# Patient Record
Sex: Male | Born: 1950 | Race: White | Hispanic: No | Marital: Married | Smoking: Former smoker
Health system: Southern US, Community
[De-identification: ages and names within clinical notes are randomized; demographics above are authoritative.]

## PROBLEM LIST (undated history)

## (undated) ENCOUNTER — Emergency Department (HOSPITAL_COMMUNITY): Discharge status: Absent without leave | Payer: Self-pay

## (undated) DIAGNOSIS — G8929 Other chronic pain: Secondary | ICD-10-CM

## (undated) DIAGNOSIS — R109 Unspecified abdominal pain: Secondary | ICD-10-CM

## (undated) DIAGNOSIS — R3911 Hesitancy of micturition: Secondary | ICD-10-CM

## (undated) DIAGNOSIS — R001 Bradycardia, unspecified: Secondary | ICD-10-CM

---

## 2014-11-09 ENCOUNTER — Encounter (HOSPITAL_COMMUNITY): Payer: Self-pay | Admitting: Neurology

## 2014-11-09 ENCOUNTER — Inpatient Hospital Stay (HOSPITAL_COMMUNITY): Payer: Federal, State, Local not specified - PPO

## 2014-11-09 ENCOUNTER — Emergency Department (HOSPITAL_COMMUNITY): Payer: Federal, State, Local not specified - PPO

## 2014-11-09 ENCOUNTER — Inpatient Hospital Stay (HOSPITAL_COMMUNITY)
Admission: EM | Admit: 2014-11-09 | Discharge: 2014-11-20 | DRG: 028 | Disposition: A | Discharge status: Rehabilitation Facility | Payer: Federal, State, Local not specified - PPO | Attending: General Surgery | Admitting: General Surgery

## 2014-11-09 DIAGNOSIS — Z7982 Long term (current) use of aspirin: Secondary | ICD-10-CM | POA: Diagnosis not present

## 2014-11-09 DIAGNOSIS — N178 Other acute kidney failure: Secondary | ICD-10-CM | POA: Diagnosis not present

## 2014-11-09 DIAGNOSIS — R509 Fever, unspecified: Secondary | ICD-10-CM | POA: Diagnosis not present

## 2014-11-09 DIAGNOSIS — Z791 Long term (current) use of non-steroidal anti-inflammatories (NSAID): Secondary | ICD-10-CM

## 2014-11-09 DIAGNOSIS — S14106S Unspecified injury at C6 level of cervical spinal cord, sequela: Secondary | ICD-10-CM | POA: Diagnosis not present

## 2014-11-09 DIAGNOSIS — G8252 Quadriplegia, C1-C4 incomplete: Secondary | ICD-10-CM | POA: Diagnosis not present

## 2014-11-09 DIAGNOSIS — Z419 Encounter for procedure for purposes other than remedying health state, unspecified: Secondary | ICD-10-CM

## 2014-11-09 DIAGNOSIS — W132XXA Fall from, out of or through roof, initial encounter: Secondary | ICD-10-CM | POA: Diagnosis present

## 2014-11-09 DIAGNOSIS — S14106D Unspecified injury at C6 level of cervical spinal cord, subsequent encounter: Secondary | ICD-10-CM | POA: Diagnosis not present

## 2014-11-09 DIAGNOSIS — N179 Acute kidney failure, unspecified: Secondary | ICD-10-CM | POA: Diagnosis not present

## 2014-11-09 DIAGNOSIS — S51021A Laceration with foreign body of right elbow, initial encounter: Secondary | ICD-10-CM | POA: Diagnosis present

## 2014-11-09 DIAGNOSIS — S12500A Unspecified displaced fracture of sixth cervical vertebra, initial encounter for closed fracture: Secondary | ICD-10-CM | POA: Diagnosis present

## 2014-11-09 DIAGNOSIS — Z79899 Other long term (current) drug therapy: Secondary | ICD-10-CM | POA: Diagnosis not present

## 2014-11-09 DIAGNOSIS — Y93H9 Activity, other involving exterior property and land maintenance, building and construction: Secondary | ICD-10-CM | POA: Diagnosis not present

## 2014-11-09 DIAGNOSIS — I951 Orthostatic hypotension: Secondary | ICD-10-CM | POA: Diagnosis not present

## 2014-11-09 DIAGNOSIS — S14125A Central cord syndrome at C5 level of cervical spinal cord, initial encounter: Principal | ICD-10-CM | POA: Diagnosis present

## 2014-11-09 DIAGNOSIS — S12400A Unspecified displaced fracture of fifth cervical vertebra, initial encounter for closed fracture: Secondary | ICD-10-CM | POA: Diagnosis present

## 2014-11-09 DIAGNOSIS — G960 Cerebrospinal fluid leak: Secondary | ICD-10-CM | POA: Diagnosis present

## 2014-11-09 DIAGNOSIS — R001 Bradycardia, unspecified: Secondary | ICD-10-CM | POA: Diagnosis present

## 2014-11-09 DIAGNOSIS — R578 Other shock: Secondary | ICD-10-CM | POA: Diagnosis present

## 2014-11-09 DIAGNOSIS — Z880 Allergy status to penicillin: Secondary | ICD-10-CM | POA: Diagnosis not present

## 2014-11-09 DIAGNOSIS — S01111A Laceration without foreign body of right eyelid and periocular area, initial encounter: Secondary | ICD-10-CM | POA: Diagnosis present

## 2014-11-09 DIAGNOSIS — S2241XA Multiple fractures of ribs, right side, initial encounter for closed fracture: Secondary | ICD-10-CM | POA: Diagnosis present

## 2014-11-09 DIAGNOSIS — K592 Neurogenic bowel, not elsewhere classified: Secondary | ICD-10-CM | POA: Diagnosis present

## 2014-11-09 DIAGNOSIS — S2249XA Multiple fractures of ribs, unspecified side, initial encounter for closed fracture: Secondary | ICD-10-CM

## 2014-11-09 DIAGNOSIS — N170 Acute kidney failure with tubular necrosis: Secondary | ICD-10-CM | POA: Diagnosis not present

## 2014-11-09 DIAGNOSIS — S12400S Unspecified displaced fracture of fifth cervical vertebra, sequela: Secondary | ICD-10-CM | POA: Diagnosis not present

## 2014-11-09 DIAGNOSIS — R40241 Glasgow coma scale score 13-15: Secondary | ICD-10-CM | POA: Diagnosis present

## 2014-11-09 DIAGNOSIS — N319 Neuromuscular dysfunction of bladder, unspecified: Secondary | ICD-10-CM | POA: Diagnosis not present

## 2014-11-09 DIAGNOSIS — G8250 Quadriplegia, unspecified: Secondary | ICD-10-CM | POA: Diagnosis present

## 2014-11-09 DIAGNOSIS — S12400D Unspecified displaced fracture of fifth cervical vertebra, subsequent encounter for fracture with routine healing: Secondary | ICD-10-CM | POA: Diagnosis not present

## 2014-11-09 DIAGNOSIS — G8254 Quadriplegia, C5-C7 incomplete: Secondary | ICD-10-CM | POA: Diagnosis not present

## 2014-11-09 DIAGNOSIS — M542 Cervicalgia: Secondary | ICD-10-CM | POA: Diagnosis present

## 2014-11-09 DIAGNOSIS — R55 Syncope and collapse: Secondary | ICD-10-CM | POA: Diagnosis not present

## 2014-11-09 HISTORY — DX: Unspecified abdominal pain: R10.9

## 2014-11-09 HISTORY — DX: Other chronic pain: G89.29

## 2014-11-09 HISTORY — DX: Bradycardia, unspecified: R00.1

## 2014-11-09 HISTORY — DX: Hesitancy of micturition: R39.11

## 2014-11-09 LAB — TYPE AND SCREEN
ABO/RH(D): O POS
Antibody Screen: NEGATIVE
Unit division: 0
Unit division: 0

## 2014-11-09 LAB — PREPARE FRESH FROZEN PLASMA
Unit division: 0
Unit division: 0

## 2014-11-09 LAB — PROTIME-INR
INR: 1.06 (ref 0.00–1.49)
Prothrombin Time: 14 seconds (ref 11.6–15.2)

## 2014-11-09 LAB — COMPREHENSIVE METABOLIC PANEL
ALT: 29 U/L (ref 17–63)
AST: 49 U/L — ABNORMAL HIGH (ref 15–41)
Albumin: 3.8 g/dL (ref 3.5–5.0)
Alkaline Phosphatase: 66 U/L (ref 38–126)
Anion gap: 10 (ref 5–15)
BUN: 25 mg/dL — ABNORMAL HIGH (ref 6–20)
CO2: 23 mmol/L (ref 22–32)
Calcium: 8.8 mg/dL — ABNORMAL LOW (ref 8.9–10.3)
Chloride: 106 mmol/L (ref 101–111)
Creatinine, Ser: 1.65 mg/dL — ABNORMAL HIGH (ref 0.61–1.24)
GFR calc Af Amer: 49 mL/min — ABNORMAL LOW (ref >60)
GFR calc non Af Amer: 42 mL/min — ABNORMAL LOW (ref >60)
Glucose, Bld: 110 mg/dL — ABNORMAL HIGH (ref 65–99)
Potassium: 4.2 mmol/L (ref 3.5–5.1)
Sodium: 139 mmol/L (ref 135–145)
Total Bilirubin: 0.9 mg/dL (ref 0.3–1.2)
Total Protein: 5.9 g/dL — ABNORMAL LOW (ref 6.5–8.1)

## 2014-11-09 LAB — CBC
HCT: 39.2 % (ref 39.0–52.0)
Hemoglobin: 13.5 g/dL (ref 13.0–17.0)
MCH: 30 pg (ref 26.0–34.0)
MCHC: 34.4 g/dL (ref 30.0–36.0)
MCV: 87.1 fL (ref 78.0–100.0)
Platelets: 175 10*3/uL (ref 150–400)
RBC: 4.5 MIL/uL (ref 4.22–5.81)
RDW: 13 % (ref 11.5–15.5)
WBC: 7.3 10*3/uL (ref 4.0–10.5)

## 2014-11-09 LAB — MRSA PCR SCREENING: MRSA by PCR: NEGATIVE

## 2014-11-09 LAB — URINALYSIS, ROUTINE W REFLEX MICROSCOPIC
Bilirubin Urine: NEGATIVE
Glucose, UA: NEGATIVE mg/dL
Ketones, ur: NEGATIVE mg/dL
Leukocytes, UA: NEGATIVE
Nitrite: NEGATIVE
Protein, ur: NEGATIVE mg/dL
Specific Gravity, Urine: 1.024 (ref 1.005–1.030)
Urobilinogen, UA: 0.2 mg/dL (ref 0.0–1.0)
pH: 5.5 (ref 5.0–8.0)

## 2014-11-09 LAB — ABO/RH: ABO/RH(D): O POS

## 2014-11-09 LAB — URINE MICROSCOPIC-ADD ON

## 2014-11-09 LAB — ETHANOL: Alcohol, Ethyl (B): <5 mg/dL (ref <5)

## 2014-11-09 LAB — CDS SEROLOGY

## 2014-11-09 MED ORDER — FENTANYL CITRATE (PF) 100 MCG/2ML IJ SOLN
100.0000 ug | Freq: Once | INTRAMUSCULAR | Status: AC
  Administered 2014-11-09: 100 ug via INTRAVENOUS

## 2014-11-09 MED ORDER — HYDROMORPHONE HCL 1 MG/ML IJ SOLN
1.0000 mg | INTRAMUSCULAR | Status: DC | PRN
  Administered 2014-11-09 – 2014-11-19 (×44): 1 mg via INTRAVENOUS
  Filled 2014-11-09 (×47): qty 1

## 2014-11-09 MED ORDER — SODIUM CHLORIDE 0.9 % IV SOLN: INTRAVENOUS | Status: DC | PRN

## 2014-11-09 MED ORDER — CETYLPYRIDINIUM CHLORIDE 0.05 % MT LIQD
7.0000 mL | Freq: Two times a day (BID) | OROMUCOSAL | Status: DC
  Administered 2014-11-09: 7 mL via OROMUCOSAL

## 2014-11-09 MED ORDER — KCL IN DEXTROSE-NACL 20-5-0.45 MEQ/L-%-% IV SOLN
INTRAVENOUS | Status: DC
Start: 2014-11-09 — End: 2014-11-14
  Administered 2014-11-09 – 2014-11-10 (×3): via INTRAVENOUS
  Administered 2014-11-10: 1000 mL via INTRAVENOUS
  Administered 2014-11-11: 12:00:00 via INTRAVENOUS
  Administered 2014-11-11 – 2014-11-12 (×2): 1000 mL via INTRAVENOUS
  Administered 2014-11-12: 16:00:00 via INTRAVENOUS
  Administered 2014-11-12: 1000 mL via INTRAVENOUS
  Administered 2014-11-13 – 2014-11-14 (×3): via INTRAVENOUS
  Filled 2014-11-09 (×17): qty 1000

## 2014-11-09 MED ORDER — PANTOPRAZOLE SODIUM 40 MG PO TBEC
40.0000 mg | DELAYED_RELEASE_TABLET | Freq: Every day | ORAL | Status: DC
  Administered 2014-11-18 – 2014-11-20 (×3): 40 mg via ORAL
  Filled 2014-11-09 (×4): qty 1

## 2014-11-09 MED ORDER — DOPAMINE-DEXTROSE 3.2-5 MG/ML-% IV SOLN
0.0000 ug/kg/min | INTRAVENOUS | Status: DC
  Administered 2014-11-10: 13 ug/kg/min via INTRAVENOUS
  Administered 2014-11-11: 6 ug/kg/min via INTRAVENOUS
  Administered 2014-11-12: 3 ug/kg/min via INTRAVENOUS
  Filled 2014-11-09 (×3): qty 250

## 2014-11-09 MED ORDER — ONDANSETRON HCL 4 MG PO TABS: 4.0000 mg | ORAL_TABLET | Freq: Four times a day (QID) | ORAL | Status: DC | PRN

## 2014-11-09 MED ORDER — DOPAMINE-DEXTROSE 3.2-5 MG/ML-% IV SOLN
INTRAVENOUS | Status: AC | PRN
  Administered 2014-11-09: 10 ug/kg/min via INTRAVENOUS

## 2014-11-09 MED ORDER — ATROPINE SULFATE 1 MG/ML IJ SOLN
INTRAMUSCULAR | Status: AC | PRN
  Administered 2014-11-09: .5 mg via INTRAVENOUS

## 2014-11-09 MED ORDER — ONDANSETRON HCL 4 MG/2ML IJ SOLN
4.0000 mg | Freq: Four times a day (QID) | INTRAMUSCULAR | Status: DC | PRN
  Administered 2014-11-10 (×2): 4 mg via INTRAVENOUS
  Filled 2014-11-09 (×2): qty 2

## 2014-11-09 MED ORDER — IOHEXOL 300 MG/ML  SOLN
100.0000 mL | Freq: Once | INTRAMUSCULAR | Status: AC | PRN
  Administered 2014-11-09: 100 mL via INTRAVENOUS

## 2014-11-09 MED ORDER — FENTANYL CITRATE (PF) 100 MCG/2ML IJ SOLN
INTRAMUSCULAR | Status: AC
  Filled 2014-11-09: qty 2

## 2014-11-09 MED ORDER — BACITRACIN-POLYMYXIN B 500-10000 UNIT/GM OP OINT
TOPICAL_OINTMENT | Freq: Two times a day (BID) | OPHTHALMIC | Status: DC
  Administered 2014-11-09 – 2014-11-16 (×13): via OPHTHALMIC
  Administered 2014-11-17: 1 via OPHTHALMIC
  Administered 2014-11-17 – 2014-11-19 (×4): via OPHTHALMIC
  Filled 2014-11-09 (×2): qty 3.5

## 2014-11-09 MED ORDER — PANTOPRAZOLE SODIUM 40 MG IV SOLR
40.0000 mg | Freq: Every day | INTRAVENOUS | Status: DC
  Administered 2014-11-09 – 2014-11-17 (×7): 40 mg via INTRAVENOUS
  Filled 2014-11-09 (×10): qty 40

## 2014-11-09 MED ORDER — HYDROMORPHONE HCL 1 MG/ML IJ SOLN
0.5000 mg | INTRAMUSCULAR | Status: DC | PRN
  Administered 2014-11-11 – 2014-11-16 (×3): 0.5 mg via INTRAVENOUS
  Filled 2014-11-09 (×5): qty 1

## 2014-11-09 MED FILL — Medication: Qty: 1 | Status: AC

## 2014-11-09 NOTE — Consult Note (Addendum)
Reason for Consult:Cervical spinal injury with quadriparesis Referring Physician:Dartagnan Beavers [name] is an 64 y.o. male.  HPI: Chief Complaint: Cannot move legs after fall  HPI: Carl [name] was painting a house and was up on the roof. He fell onto a lower roof and then fell again onto the ground. He struck his head during the fall. Afterwards, he was unable to move his legs. He was transported as a level II trauma but upgraded to level I in transport due to hypotension. On arrival he complains of inability to move his legs and that his trunk and arms feel tingly.      History reviewed. No pertinent past medical history.  History reviewed. No pertinent past surgical history.  No family history on file.  Social History:  has no tobacco, alcohol, and drug history on file.  Allergies:  Allergies  Allergen Reactions  . Penicillins Hives    Medications: I have reviewed the patient's current medications.  Results for orders placed or performed during the hospital encounter of 11/09/14 (from the past 48 hour(s))  Prepare fresh frozen plasma     Status: None   Collection Time: 11/09/14  3:21 PM  Result Value Ref Range   Unit Number [id number]    Blood Component Type LIQ PLASMA    Unit division 00    Status of Unit REL FROM [facility]    Unit tag comment VERBAL ORDERS PER DR KOHUT    Transfusion Status OK TO TRANSFUSE    Unit Number [id number]    Blood Component Type LIQ PLASMA    Unit division 00    Status of Unit REL FROM [facility]    Unit tag comment VERBAL ORDERS PER DR KOHUT    Transfusion Status OK TO TRANSFUSE   Type and screen     Status: None   Collection Time: 11/09/14  3:30 PM  Result Value Ref Range   ABO/RH(D) O POS    Antibody Screen NEG    Sample Expiration 11/12/2014    Unit Number [id number]    Blood Component Type RBC LR PHER1    Unit division 00    Status of Unit REL FROM [facility]    Unit tag comment VERBAL ORDERS PER DR KOHUT    Transfusion Status OK TO  TRANSFUSE    Crossmatch Result NOT NEEDED    Unit Number [id number]    Blood Component Type RED CELLS,LR    Unit division 00    Status of Unit REL FROM [facility]    Unit tag comment VERBAL ORDERS PER DR KOHUT    Transfusion Status OK TO TRANSFUSE    Crossmatch Result NOT NEEDED   CDS serology     Status: None   Collection Time: 11/09/14  3:30 PM  Result Value Ref Range   CDS serology specimen      SPECIMEN WILL BE HELD FOR 14 DAYS IF TESTING IS REQUIRED  Comprehensive metabolic panel     Status: Abnormal   Collection Time: 11/09/14  3:30 PM  Result Value Ref Range   Sodium 139 135 - 145 mmol/L   Potassium 4.2 3.5 - 5.1 mmol/L   Chloride 106 101 - 111 mmol/L   CO2 23 22 - 32 mmol/L   Glucose, Bld 110 (H) 65 - 99 mg/dL   BUN 25 (H) 6 - 20 mg/dL   Creatinine, Ser 1.65 (H) 0.61 - 1.24 mg/dL   Calcium 8.8 (L) 8.9 - 10.3 mg/dL   Total Protein 5.9 (L) 6.5 - 8.1  g/dL   Albumin 3.8 3.5 - 5.0 g/dL   AST 49 (H) 15 - 41 U/L   ALT 29 17 - 63 U/L   Alkaline Phosphatase 66 38 - 126 U/L   Total Bilirubin 0.9 0.3 - 1.2 mg/dL   GFR calc non Af Amer 42 (L) >60 mL/min   GFR calc Af Amer 49 (L) >60 mL/min    Comment: (NOTE) The eGFR has been calculated using the CKD EPI equation. This calculation has not been validated in all clinical situations. eGFR's persistently <60 mL/min signify possible Chronic Kidney Disease.    Anion gap 10 5 - 15  CBC     Status: None   Collection Time: 11/09/14  3:30 PM  Result Value Ref Range   WBC 7.3 4.0 - 10.5 K/uL   RBC 4.50 4.22 - 5.81 MIL/uL   Hemoglobin 13.5 13.0 - 17.0 g/dL   HCT 39.2 39.0 - 52.0 %   MCV 87.1 78.0 - 100.0 fL   MCH 30.0 26.0 - 34.0 pg   MCHC 34.4 30.0 - 36.0 g/dL   RDW 13.0 11.5 - 15.5 %   Platelets 175 150 - 400 K/uL  Protime-INR     Status: None   Collection Time: 11/09/14  3:30 PM  Result Value Ref Range   Prothrombin Time 14.0 11.6 - 15.2 seconds   INR 1.06 0.00 - 1.49  ABO/Rh     Status: None   Collection Time: 11/09/14   3:30 PM  Result Value Ref Range   ABO/RH(D) O POS   Ethanol     Status: None   Collection Time: 11/09/14  3:32 PM  Result Value Ref Range   Alcohol, Ethyl (B) <5 <5 mg/dL    Comment:        LOWEST DETECTABLE LIMIT FOR SERUM ALCOHOL IS 5 mg/dL FOR MEDICAL PURPOSES ONLY   Urinalysis, Routine w reflex microscopic (not at [facility])     Status: Abnormal   Collection Time: 11/09/14  5:15 PM  Result Value Ref Range   Color, Urine YELLOW YELLOW   APPearance CLEAR CLEAR   Specific Gravity, Urine 1.024 1.005 - 1.030   pH 5.5 5.0 - 8.0   Glucose, UA NEGATIVE NEGATIVE mg/dL   Hgb urine dipstick MODERATE (A) NEGATIVE   Bilirubin Urine NEGATIVE NEGATIVE   Ketones, ur NEGATIVE NEGATIVE mg/dL   Protein, ur NEGATIVE NEGATIVE mg/dL   Urobilinogen, UA 0.2 0.0 - 1.0 mg/dL   Nitrite NEGATIVE NEGATIVE   Leukocytes, UA NEGATIVE NEGATIVE  Urine microscopic-add on     Status: None   Collection Time: 11/09/14  5:15 PM  Result Value Ref Range   Squamous Epithelial / LPF RARE RARE   WBC, UA 0-2 <3 WBC/hpf   RBC / HPF 0-2 <3 RBC/hpf   Bacteria, UA RARE RARE    Ct Head Wo Contrast  11/09/2014   CLINICAL DATA:  Patient is status post fall with neck pain and bilateral shoulder pain. Patient not able to move lower extremities, with tingling in hands and arms.  EXAM: CT HEAD WITHOUT CONTRAST  TECHNIQUE: Contiguous axial images were obtained from the vertex to the thoracic inlet without intravenous contrast.  COMPARISON:  None.  FINDINGS: CT of the head: No mass effect or midline shift. No evidence of acute intracranial hemorrhage, or infarction. No abnormal extra-axial fluid collections. Gray-white matter differentiation is normal. Basal cisterns are preserved.  No depressed skull fractures. Visualized paranasal sinuses and mastoid air cells are not opacified. There is a  soft tissue injury of the right forehead superior to the right orbit containing punctate hyperdense foreign body in a 2 mm gas bubble. No underlying  osseous injury seen.  CT of the cervical spine: There is a comminuted minimally displaced fracture of the left inferior articular facet of C5 and minimally displaced fracture of the left superior articular facet of C6. A linear calcific fragment is seen within the L5 left neural foramen, which likely represents a fracture fragment. Additionally, there is a fracture of the posterior process of C4, with left lateral displacement of the distal fracture fragment. There is also a nondisplaced fracture of the posterior process of C5. The may be a tiny avulsion fracture off of the posterior facet of C6. There is no evidence of alignment abnormality. No evidence of epidural hematoma or spinal canal narrowing.  There are multilevel degenerative changes of the cervical spine with disc height loss, endplate sclerosis and posterior and anterior osteophytes. Ligamentous calcifications are also seen.  CT of the face: The globes and extraocular muscles appear symmetrical. No air fluid levels in the paranasal sinuses.  The frontal bones, orbital rims, maxillary antral walls, nasal bones, nasal septum, nasal spine, maxilla, pterygoid plates, zygomatic arches, temporomandibular joints, and mandibles appear intact.  No displaced fractures are identified. Visualized thyroid cartilage and hyoid bone appear intact.  IMPRESSION: No CT evidence of intracranial traumatic injury.  Comminuted fracture of left C5 articular facet, and minimally displaced fracture of the left C6 articular facet, with possible minute fracture fragment within C5 left neural foramen.  Fractures of C4, C5 and likely C6 posterior processes.  No evidence of epidural hematoma, or spinal canal narrowing.  No evidence of facial fractures.  Given the severity of patient's symptoms, MRI of the cervical spine may be considered, when clinically feasible.  Key findings were discussed with Carl [name] at the completion of the exam.   Electronically Signed   By: Fidela Salisbury M.D.   On: 11/09/2014 17:17   Ct Chest W Contrast  11/09/2014   CLINICAL DATA:  Fall.  Cannot move lower extremities.  EXAM: CT CHEST, ABDOMEN, AND PELVIS WITH CONTRAST  TECHNIQUE: Multidetector CT imaging of the chest, abdomen and pelvis was performed following the standard protocol during bolus administration of intravenous contrast.  CONTRAST:  11m OMNIPAQUE IOHEXOL 300 MG/ML  SOLN  COMPARISON:  None.  FINDINGS: CT CHEST FINDINGS  THORACIC INLET/BODY WALL:  No acute abnormality.  MEDIASTINUM:  Normal heart size. No pericardial effusion. No acute vascular abnormality. No adenopathy.  LUNG WINDOWS:  No contusion, hemothorax, or pneumothorax.  4 mm pulmonary nodule in the upper lingula on image 28. 2 mm subpleural nodule on the right on image 34.  OSSEOUS:  See below  CT ABDOMEN AND PELVIS FINDINGS  BODY WALL: Unremarkable.  Liver: No focal abnormality.  Biliary: No evidence of biliary obstruction or stone.  Pancreas: Unremarkable.  Spleen: Unremarkable.  Adrenals: Unremarkable.  Kidneys and ureters: No traumatic findings.  Bladder: Unremarkable.  Reproductive: Enlarged prostate, projecting into the bladder base.  Bowel: No evidence of injury  Retroperitoneum: No mass or adenopathy.  Peritoneum: No free fluid or gas.  Vascular: No acute findings.  OSSEOUS: Nondisplaced posterior right ninth and tenth rib fractures.  Left C5 articular process fracture with bony fragment in the C5-6 foramen, as described on dedicated CT of the cervical spine. No cervical or thoracic spine fractures identified.  IMPRESSION: 1. Nondisplaced posterior right ninth and tenth rib fractures. 2. No evidence of intrathoracic  or intra-abdominal injury. 3. 4 mm pulmonary nodule in the lingula. If the patient is at high risk for bronchogenic carcinoma, follow-up chest CT at 1 year is recommended. If the patient is at low risk, no follow-up is needed. This recommendation follows the consensus statement: Guidelines for Management of  Small Pulmonary Nodules Detected on CT Scans: A Statement from the [employer] as published in Radiology 2005; 237:395-400. 4. Enlarged prostate.   Electronically Signed   By: Monte Fantasia M.D.   On: 11/09/2014 16:49   Ct Cervical Spine Wo Contrast  11/09/2014   CLINICAL DATA:  Patient is status post fall with neck pain and bilateral shoulder pain. Patient not able to move lower extremities, with tingling in hands and arms.  EXAM: CT HEAD WITHOUT CONTRAST  TECHNIQUE: Contiguous axial images were obtained from the vertex to the thoracic inlet without intravenous contrast.  COMPARISON:  None.  FINDINGS: CT of the head: No mass effect or midline shift. No evidence of acute intracranial hemorrhage, or infarction. No abnormal extra-axial fluid collections. Gray-white matter differentiation is normal. Basal cisterns are preserved.  No depressed skull fractures. Visualized paranasal sinuses and mastoid air cells are not opacified. There is a soft tissue injury of the right forehead superior to the right orbit containing punctate hyperdense foreign body in a 2 mm gas bubble. No underlying osseous injury seen.  CT of the cervical spine: There is a comminuted minimally displaced fracture of the left inferior articular facet of C5 and minimally displaced fracture of the left superior articular facet of C6. A linear calcific fragment is seen within the L5 left neural foramen, which likely represents a fracture fragment. Additionally, there is a fracture of the posterior process of C4, with left lateral displacement of the distal fracture fragment. There is also a nondisplaced fracture of the posterior process of C5. The may be a tiny avulsion fracture off of the posterior facet of C6. There is no evidence of alignment abnormality. No evidence of epidural hematoma or spinal canal narrowing.  There are multilevel degenerative changes of the cervical spine with disc height loss, endplate sclerosis and posterior and  anterior osteophytes. Ligamentous calcifications are also seen.  CT of the face: The globes and extraocular muscles appear symmetrical. No air fluid levels in the paranasal sinuses.  The frontal bones, orbital rims, maxillary antral walls, nasal bones, nasal septum, nasal spine, maxilla, pterygoid plates, zygomatic arches, temporomandibular joints, and mandibles appear intact.  No displaced fractures are identified. Visualized thyroid cartilage and hyoid bone appear intact.  IMPRESSION: No CT evidence of intracranial traumatic injury.  Comminuted fracture of left C5 articular facet, and minimally displaced fracture of the left C6 articular facet, with possible minute fracture fragment within C5 left neural foramen.  Fractures of C4, C5 and likely C6 posterior processes.  No evidence of epidural hematoma, or spinal canal narrowing.  No evidence of facial fractures.  Given the severity of patient's symptoms, MRI of the cervical spine may be considered, when clinically feasible.  Key findings were discussed with Carl [name] at the completion of the exam.   Electronically Signed   By: Fidela Salisbury M.D.   On: 11/09/2014 17:17   Ct Abdomen Pelvis W Contrast  11/09/2014   CLINICAL DATA:  Fall.  Cannot move lower extremities.  EXAM: CT CHEST, ABDOMEN, AND PELVIS WITH CONTRAST  TECHNIQUE: Multidetector CT imaging of the chest, abdomen and pelvis was performed following the standard protocol during bolus administration of intravenous contrast.  CONTRAST:  136m OMNIPAQUE IOHEXOL 300 MG/ML  SOLN  COMPARISON:  None.  FINDINGS: CT CHEST FINDINGS  THORACIC INLET/BODY WALL:  No acute abnormality.  MEDIASTINUM:  Normal heart size. No pericardial effusion. No acute vascular abnormality. No adenopathy.  LUNG WINDOWS:  No contusion, hemothorax, or pneumothorax.  4 mm pulmonary nodule in the upper lingula on image 28. 2 mm subpleural nodule on the right on image 34.  OSSEOUS:  See below  CT ABDOMEN AND PELVIS FINDINGS  BODY  WALL: Unremarkable.  Liver: No focal abnormality.  Biliary: No evidence of biliary obstruction or stone.  Pancreas: Unremarkable.  Spleen: Unremarkable.  Adrenals: Unremarkable.  Kidneys and ureters: No traumatic findings.  Bladder: Unremarkable.  Reproductive: Enlarged prostate, projecting into the bladder base.  Bowel: No evidence of injury  Retroperitoneum: No mass or adenopathy.  Peritoneum: No free fluid or gas.  Vascular: No acute findings.  OSSEOUS: Nondisplaced posterior right ninth and tenth rib fractures.  Left C5 articular process fracture with bony fragment in the C5-6 foramen, as described on dedicated CT of the cervical spine. No cervical or thoracic spine fractures identified.  IMPRESSION: 1. Nondisplaced posterior right ninth and tenth rib fractures. 2. No evidence of intrathoracic or intra-abdominal injury. 3. 4 mm pulmonary nodule in the lingula. If the patient is at high risk for bronchogenic carcinoma, follow-up chest CT at 1 year is recommended. If the patient is at low risk, no follow-up is needed. This recommendation follows the consensus statement: Guidelines for Management of Small Pulmonary Nodules Detected on CT Scans: A Statement from the [employer] published in Radiology 2005; 237:395-400. 4. Enlarged prostate.   Electronically Signed   By: JMonte FantasiaM.D.   On: 11/09/2014 16:49   Dg Pelvis Portable  11/09/2014   CLINICAL DATA:  Patient fell from ladder  EXAM: PORTABLE PELVIS 1-2 VIEWS  COMPARISON:  None.  FINDINGS: There is no evidence of pelvic fracture or dislocation. Joint spaces appear intact. No erosive change.  IMPRESSION: No demonstrable fracture or dislocation. No appreciable arthropathy.   Electronically Signed   By: WLowella GripIII M.D.   On: 11/09/2014 15:55   Dg Chest Portable 1 View  11/09/2014   CLINICAL DATA:  Fall  EXAM: PORTABLE CHEST - 1 VIEW  COMPARISON:  None.  FINDINGS: Heart size at upper limits of normal. Lungs are grossly clear. Support  apparatus artifact is present over the mid chest. No gross evidence for pneumothorax. No displaced fracture identified.  IMPRESSION: No gross evidence for acute cardiopulmonary process. Borderline cardiomegaly may in part be related to portable supine AP technique. Chest CT will be subsequently performed and dictated separately.   Electronically Signed   By: GConchita ParisM.D.   On: 11/09/2014 15:56   Ct Maxillofacial Wo Cm  11/09/2014   CLINICAL DATA:  Patient is status post fall with neck pain and bilateral shoulder pain. Patient not able to move lower extremities, with tingling in hands and arms.  EXAM: CT HEAD WITHOUT CONTRAST  TECHNIQUE: Contiguous axial images were obtained from the vertex to the thoracic inlet without intravenous contrast.  COMPARISON:  None.  FINDINGS: CT of the head: No mass effect or midline shift. No evidence of acute intracranial hemorrhage, or infarction. No abnormal extra-axial fluid collections. Gray-white matter differentiation is normal. Basal cisterns are preserved.  No depressed skull fractures. Visualized paranasal sinuses and mastoid air cells are not opacified. There is a soft tissue injury of the right forehead superior to the right orbit containing  punctate hyperdense foreign body in a 2 mm gas bubble. No underlying osseous injury seen.  CT of the cervical spine: There is a comminuted minimally displaced fracture of the left inferior articular facet of C5 and minimally displaced fracture of the left superior articular facet of C6. A linear calcific fragment is seen within the L5 left neural foramen, which likely represents a fracture fragment. Additionally, there is a fracture of the posterior process of C4, with left lateral displacement of the distal fracture fragment. There is also a nondisplaced fracture of the posterior process of C5. The may be a tiny avulsion fracture off of the posterior facet of C6. There is no evidence of alignment abnormality. No evidence of  epidural hematoma or spinal canal narrowing.  There are multilevel degenerative changes of the cervical spine with disc height loss, endplate sclerosis and posterior and anterior osteophytes. Ligamentous calcifications are also seen.  CT of the face: The globes and extraocular muscles appear symmetrical. No air fluid levels in the paranasal sinuses.  The frontal bones, orbital rims, maxillary antral walls, nasal bones, nasal septum, nasal spine, maxilla, pterygoid plates, zygomatic arches, temporomandibular joints, and mandibles appear intact.  No displaced fractures are identified. Visualized thyroid cartilage and hyoid bone appear intact.  IMPRESSION: No CT evidence of intracranial traumatic injury.  Comminuted fracture of left C5 articular facet, and minimally displaced fracture of the left C6 articular facet, with possible minute fracture fragment within C5 left neural foramen.  Fractures of C4, C5 and likely C6 posterior processes.  No evidence of epidural hematoma, or spinal canal narrowing.  No evidence of facial fractures.  Given the severity of patient's symptoms, MRI of the cervical spine may be considered, when clinically feasible.  Key findings were discussed with Carl [name] at the completion of the exam.   Electronically Signed   By: Fidela Salisbury M.D.   On: 11/09/2014 17:17    Review of Systems - Negative except Bradycardia    Blood pressure 144/67, pulse 86, temperature 98.2 F (36.8 C), resp. rate 17, height '5\' 8"'  (1.727 m), weight 88.451 kg (195 lb), SpO2 100 %. Physical Exam  Constitutional: He is oriented to person, place, and time. He appears well-developed and well-nourished.  HENT:  Head: Normocephalic.    Eyes: Conjunctivae and EOM are normal. Pupils are equal, round, and reactive to light.  Neck: Spinous process tenderness and muscular tenderness present.  Neurological: He is alert and oriented to person, place, and time. He displays normal reflexes. No cranial nerve  deficit or sensory deficit. He exhibits abnormal muscle tone. GCS eye subscore is 4. GCS verbal subscore is 5. GCS motor subscore is 6. He displays no Babinski's sign on the right side. He displays no Babinski's sign on the left side.  Reflex Scores:      Tricep reflexes are 1+ on the right side and 1+ on the left side.      Bicep reflexes are 1+ on the right side and 1+ on the left side.      Brachioradialis reflexes are 1+ on the right side and 1+ on the left side.      Patellar reflexes are 1+ on the right side and 1+ on the left side.      Achilles reflexes are 1+ on the right side and 1+ on the left side. Cervico-thoracic sensory level  Good bilateral Deltoid, Bicep, tricep strength bilaterally with marked impairment in grips and hand intrinsics  Improving lower extremity strength with thigh adduction,  rotation, antigravity strength in right lower extremity and not in left, able to move both feet weakly   Decreased rectal tone per Dr. Grandville Silos  Assessment/Plan: Patient appears to have had a spinal cord injury due to hyperextension at C 56.  He has multilevel spondylosis.  He has had BP instability and has required pressors.  Neurologic exam appears to be improving.  Continue to observe in ICU.  Plan cervical MRI when medically stable.  May require cervical spine surgery.  Peggyann Shoals, MD 11/09/2014, 6:01 PM

## 2014-11-09 NOTE — Consult Note (Signed)
Reason for consult:  HPI: Carl [name] is an 64 y.o. male we are asked to evaluate for globe injury in the setting of recent trauma.  The patient fell from his roof and sustained multiple injuries including a cervical injury.   He is under the care of the trauma service who found a laceration of the right brow and right upper eyelid.  Currently the patient denies any eye pain, diplopia, changes in vision or difficulty moving either eye.   POH:  None.    History reviewed. No pertinent past medical history. History reviewed. No pertinent past surgical history. No family history on file. No current facility-administered medications for this encounter.   Current Outpatient Prescriptions  Medication Sig Dispense Refill  . aspirin EC 81 MG tablet Take 81 mg by mouth daily.    Marland Kitchen ibuprofen (ADVIL,MOTRIN) 200 MG tablet Take 200 mg by mouth every 6 (six) hours as needed for headache, mild pain or moderate pain.    . Saw Palmetto, Serenoa repens, (SAW PALMETTO PO) Take 1 tablet by mouth daily.     Allergies  Allergen Reactions  . Penicillins Hives   History   Social History  . Marital Status: Married    Spouse Name: N/A  . Number of Children: N/A  . Years of Education: N/A   Occupational History  . Not on file.   Social History Main Topics  . Smoking status: Not on file  . Smokeless tobacco: Not on file  . Alcohol Use: Not on file  . Drug Use: Not on file  . Sexual Activity: Not on file   Other Topics Concern  . Not on file   Social History Narrative  . No narrative on file    Review of systems: ROS  Physical Exam:  Blood pressure 144/67, pulse 86, temperature 98.2 F (36.8 C), resp. rate 17, height _0  (1.727 m), weight 88.451 kg (195 lb), SpO2 100 %.   VA cc unable - patient too uncomfortable and nauseated to cooperate with acuity testing.  Grossly visual acuity intact OU.   Pupils:   OD round, reactive to light, no APD            OS round, reactive to light, no  APD  IOP (T pen)  OD 21    OS  21   Motility:  OD full ductions  OS full ductions  Balance/alignment:  Ortho by Luiz Ochoa   Slit lamp examination:                                 OD                                       External/adnexa: extensive periocular echymosis and 1-2+ s.t. edema superior orbtial rim; irregular laceration of the right brow; puncture wound superolateral brow; lid margin and lash line intact.                                       Lids/lashes:        ehymosis upper lid.  Conjunctiva        White, quiet        Cornea:              Clear                  AC:                     Deep, quiet                                Iris:                     Normal        Lens:                  Clear                                       OS                                       External/adnexa: Normal                                      Lids/lashes:        Normal                                      Conjunctiva        White, quiet        Cornea:              Clear                  AC:                     Deep, quiet                                Iris:                     Normal        Lens:                  Clear       Dilated fundus exam: (Neo 2.5; Myd 1%)      OD Vitreous            Clear, quiet                                Optic Disc:       Normal, perfused                      Macula:             Flat  Vessels:           Normal caliber,distribution         Periphery:         Flat, attached                                      OS Vitreous            Clear, quiet                                Optic Disc:       Normal, perfused                      Macula:             Flat                                            Vessels:           Normal caliber,distribution         Periphery:         Flat, attached        Labs/studies: Results for orders placed or performed during the  hospital encounter of 11/09/14 (from the past 48 hour(s))  Prepare fresh frozen plasma     Status: None   Collection Time: 11/09/14  3:21 PM  Result Value Ref Range   Unit Number [id number]    Blood Component Type LIQ PLASMA    Unit division 00    Status of Unit REL FROM [facility]    Unit tag comment VERBAL ORDERS PER DR KOHUT    Transfusion Status OK TO TRANSFUSE    Unit Number [id number]    Blood Component Type LIQ PLASMA    Unit division 00    Status of Unit REL FROM [facility]    Unit tag comment VERBAL ORDERS PER DR KOHUT    Transfusion Status OK TO TRANSFUSE   Type and screen     Status: None   Collection Time: 11/09/14  3:30 PM  Result Value Ref Range   ABO/RH(D) O POS    Antibody Screen NEG    Sample Expiration 11/12/2014    Unit Number [id number]    Blood Component Type RBC LR PHER1    Unit division 00    Status of Unit REL FROM [facility]    Unit tag comment VERBAL ORDERS PER DR KOHUT    Transfusion Status OK TO TRANSFUSE    Crossmatch Result NOT NEEDED    Unit Number [id number]    Blood Component Type RED CELLS,LR    Unit division 00    Status of Unit REL FROM [facility]    Unit tag comment VERBAL ORDERS PER DR KOHUT    Transfusion Status OK TO TRANSFUSE    Crossmatch Result NOT NEEDED   CDS serology     Status: None   Collection Time: 11/09/14  3:30 PM  Result Value Ref Range   CDS serology specimen      SPECIMEN WILL BE HELD FOR 14 DAYS IF TESTING IS REQUIRED  Comprehensive metabolic panel     Status: Abnormal   Collection Time: 11/09/14  3:30 PM  Result Value Ref Range  Sodium 139 135 - 145 mmol/L   Potassium 4.2 3.5 - 5.1 mmol/L   Chloride 106 101 - 111 mmol/L   CO2 23 22 - 32 mmol/L   Glucose, Bld 110 (H) 65 - 99 mg/dL   BUN 25 (H) 6 - 20 mg/dL   Creatinine, Ser 1.65 (H) 0.61 - 1.24 mg/dL   Calcium 8.8 (L) 8.9 - 10.3 mg/dL   Total Protein 5.9 (L) 6.5 - 8.1 g/dL   Albumin 3.8 3.5 - 5.0 g/dL   AST 49 (H) 15 - 41 U/L   ALT 29 17 - 63 U/L   Alkaline  Phosphatase 66 38 - 126 U/L   Total Bilirubin 0.9 0.3 - 1.2 mg/dL   GFR calc non Af Amer 42 (L) >60 mL/min   GFR calc Af Amer 49 (L) >60 mL/min    Comment: (NOTE) The eGFR has been calculated using the CKD EPI equation. This calculation has not been validated in all clinical situations. eGFR's persistently <60 mL/min signify possible Chronic Kidney Disease.    Anion gap 10 5 - 15  CBC     Status: None   Collection Time: 11/09/14  3:30 PM  Result Value Ref Range   WBC 7.3 4.0 - 10.5 K/uL   RBC 4.50 4.22 - 5.81 MIL/uL   Hemoglobin 13.5 13.0 - 17.0 g/dL   HCT 39.2 39.0 - 52.0 %   MCV 87.1 78.0 - 100.0 fL   MCH 30.0 26.0 - 34.0 pg   MCHC 34.4 30.0 - 36.0 g/dL   RDW 13.0 11.5 - 15.5 %   Platelets 175 150 - 400 K/uL  Protime-INR     Status: None   Collection Time: 11/09/14  3:30 PM  Result Value Ref Range   Prothrombin Time 14.0 11.6 - 15.2 seconds   INR 1.06 0.00 - 1.49  ABO/Rh     Status: None   Collection Time: 11/09/14  3:30 PM  Result Value Ref Range   ABO/RH(D) O POS   Ethanol     Status: None   Collection Time: 11/09/14  3:32 PM  Result Value Ref Range   Alcohol, Ethyl (B) <5 <5 mg/dL    Comment:        LOWEST DETECTABLE LIMIT FOR SERUM ALCOHOL IS 5 mg/dL FOR MEDICAL PURPOSES ONLY   Urinalysis, Routine w reflex microscopic (not at [facility])     Status: Abnormal   Collection Time: 11/09/14  5:15 PM  Result Value Ref Range   Color, Urine YELLOW YELLOW   APPearance CLEAR CLEAR   Specific Gravity, Urine 1.024 1.005 - 1.030   pH 5.5 5.0 - 8.0   Glucose, UA NEGATIVE NEGATIVE mg/dL   Hgb urine dipstick MODERATE (A) NEGATIVE   Bilirubin Urine NEGATIVE NEGATIVE   Ketones, ur NEGATIVE NEGATIVE mg/dL   Protein, ur NEGATIVE NEGATIVE mg/dL   Urobilinogen, UA 0.2 0.0 - 1.0 mg/dL   Nitrite NEGATIVE NEGATIVE   Leukocytes, UA NEGATIVE NEGATIVE  Urine microscopic-add on     Status: None   Collection Time: 11/09/14  5:15 PM  Result Value Ref Range   Squamous Epithelial / LPF RARE  RARE   WBC, UA 0-2 <3 WBC/hpf   RBC / HPF 0-2 <3 RBC/hpf   Bacteria, UA RARE RARE   Ct Head Wo Contrast  11/09/2014   CLINICAL DATA:  Patient is status post fall with neck pain and bilateral shoulder pain. Patient not able to move lower extremities, with tingling in hands and arms.  EXAM:  CT HEAD WITHOUT CONTRAST  TECHNIQUE: Contiguous axial images were obtained from the vertex to the thoracic inlet without intravenous contrast.  COMPARISON:  None.  FINDINGS: CT of the head: No mass effect or midline shift. No evidence of acute intracranial hemorrhage, or infarction. No abnormal extra-axial fluid collections. Gray-white matter differentiation is normal. Basal cisterns are preserved.  No depressed skull fractures. Visualized paranasal sinuses and mastoid air cells are not opacified. There is a soft tissue injury of the right forehead superior to the right orbit containing punctate hyperdense foreign body in a 2 mm gas bubble. No underlying osseous injury seen.  CT of the cervical spine: There is a comminuted minimally displaced fracture of the left inferior articular facet of C5 and minimally displaced fracture of the left superior articular facet of C6. A linear calcific fragment is seen within the L5 left neural foramen, which likely represents a fracture fragment. Additionally, there is a fracture of the posterior process of C4, with left lateral displacement of the distal fracture fragment. There is also a nondisplaced fracture of the posterior process of C5. The may be a tiny avulsion fracture off of the posterior facet of C6. There is no evidence of alignment abnormality. No evidence of epidural hematoma or spinal canal narrowing.  There are multilevel degenerative changes of the cervical spine with disc height loss, endplate sclerosis and posterior and anterior osteophytes. Ligamentous calcifications are also seen.  CT of the face: The globes and extraocular muscles appear symmetrical. No air fluid levels  in the paranasal sinuses.  The frontal bones, orbital rims, maxillary antral walls, nasal bones, nasal septum, nasal spine, maxilla, pterygoid plates, zygomatic arches, temporomandibular joints, and mandibles appear intact.  No displaced fractures are identified. Visualized thyroid cartilage and hyoid bone appear intact.  IMPRESSION: No CT evidence of intracranial traumatic injury.  Comminuted fracture of left C5 articular facet, and minimally displaced fracture of the left C6 articular facet, with possible minute fracture fragment within C5 left neural foramen.  Fractures of C4, C5 and likely C6 posterior processes.  No evidence of epidural hematoma, or spinal canal narrowing.  No evidence of facial fractures.  Given the severity of patient's symptoms, MRI of the cervical spine may be considered, when clinically feasible.  Key findings were discussed with Georganna Skeans at the completion of the exam.   Electronically Signed   By: Fidela Salisbury M.D.   On: 11/09/2014 17:17   Ct Chest W Contrast  11/09/2014   CLINICAL DATA:  Fall.  Cannot move lower extremities.  EXAM: CT CHEST, ABDOMEN, AND PELVIS WITH CONTRAST  TECHNIQUE: Multidetector CT imaging of the chest, abdomen and pelvis was performed following the standard protocol during bolus administration of intravenous contrast.  CONTRAST:  185m OMNIPAQUE IOHEXOL 300 MG/ML  SOLN  COMPARISON:  None.  FINDINGS: CT CHEST FINDINGS  THORACIC INLET/BODY WALL:  No acute abnormality.  MEDIASTINUM:  Normal heart size. No pericardial effusion. No acute vascular abnormality. No adenopathy.  LUNG WINDOWS:  No contusion, hemothorax, or pneumothorax.  4 mm pulmonary nodule in the upper lingula on image 28. 2 mm subpleural nodule on the right on image 34.  OSSEOUS:  See below  CT ABDOMEN AND PELVIS FINDINGS  BODY WALL: Unremarkable.  Liver: No focal abnormality.  Biliary: No evidence of biliary obstruction or stone.  Pancreas: Unremarkable.  Spleen: Unremarkable.  Adrenals:  Unremarkable.  Kidneys and ureters: No traumatic findings.  Bladder: Unremarkable.  Reproductive: Enlarged prostate, projecting into the bladder base.  Bowel:  No evidence of injury  Retroperitoneum: No mass or adenopathy.  Peritoneum: No free fluid or gas.  Vascular: No acute findings.  OSSEOUS: Nondisplaced posterior right ninth and tenth rib fractures.  Left C5 articular process fracture with bony fragment in the C5-6 foramen, as described on dedicated CT of the cervical spine. No cervical or thoracic spine fractures identified.  IMPRESSION: 1. Nondisplaced posterior right ninth and tenth rib fractures. 2. No evidence of intrathoracic or intra-abdominal injury. 3. 4 mm pulmonary nodule in the lingula. If the patient is at high risk for bronchogenic carcinoma, follow-up chest CT at 1 year is recommended. If the patient is at low risk, no follow-up is needed. This recommendation follows the consensus statement: Guidelines for Management of Small Pulmonary Nodules Detected on CT Scans: A Statement from the [place] as published in Radiology 2005; 237:395-400. 4. Enlarged prostate.   Electronically Signed   By: Monte Fantasia M.D.   On: 11/09/2014 16:49   Ct Cervical Spine Wo Contrast  11/09/2014   CLINICAL DATA:  Patient is status post fall with neck pain and bilateral shoulder pain. Patient not able to move lower extremities, with tingling in hands and arms.  EXAM: CT HEAD WITHOUT CONTRAST  TECHNIQUE: Contiguous axial images were obtained from the vertex to the thoracic inlet without intravenous contrast.  COMPARISON:  None.  FINDINGS: CT of the head: No mass effect or midline shift. No evidence of acute intracranial hemorrhage, or infarction. No abnormal extra-axial fluid collections. Gray-white matter differentiation is normal. Basal cisterns are preserved.  No depressed skull fractures. Visualized paranasal sinuses and mastoid air cells are not opacified. There is a soft tissue injury of the right  forehead superior to the right orbit containing punctate hyperdense foreign body in a 2 mm gas bubble. No underlying osseous injury seen.  CT of the cervical spine: There is a comminuted minimally displaced fracture of the left inferior articular facet of C5 and minimally displaced fracture of the left superior articular facet of C6. A linear calcific fragment is seen within the L5 left neural foramen, which likely represents a fracture fragment. Additionally, there is a fracture of the posterior process of C4, with left lateral displacement of the distal fracture fragment. There is also a nondisplaced fracture of the posterior process of C5. The may be a tiny avulsion fracture off of the posterior facet of C6. There is no evidence of alignment abnormality. No evidence of epidural hematoma or spinal canal narrowing.  There are multilevel degenerative changes of the cervical spine with disc height loss, endplate sclerosis and posterior and anterior osteophytes. Ligamentous calcifications are also seen.  CT of the face: The globes and extraocular muscles appear symmetrical. No air fluid levels in the paranasal sinuses.  The frontal bones, orbital rims, maxillary antral walls, nasal bones, nasal septum, nasal spine, maxilla, pterygoid plates, zygomatic arches, temporomandibular joints, and mandibles appear intact.  No displaced fractures are identified. Visualized thyroid cartilage and hyoid bone appear intact.  IMPRESSION: No CT evidence of intracranial traumatic injury.  Comminuted fracture of left C5 articular facet, and minimally displaced fracture of the left C6 articular facet, with possible minute fracture fragment within C5 left neural foramen.  Fractures of C4, C5 and likely C6 posterior processes.  No evidence of epidural hematoma, or spinal canal narrowing.  No evidence of facial fractures.  Given the severity of patient's symptoms, MRI of the cervical spine may be considered, when clinically feasible.  Key  findings were discussed with Lavone Neri  [place] Silos at the completion of the exam.   Electronically Signed   By: Fidela Salisbury M.D.   On: 11/09/2014 17:17   Ct Abdomen Pelvis W Contrast  11/09/2014   CLINICAL DATA:  Fall.  Cannot move lower extremities.  EXAM: CT CHEST, ABDOMEN, AND PELVIS WITH CONTRAST  TECHNIQUE: Multidetector CT imaging of the chest, abdomen and pelvis was performed following the standard protocol during bolus administration of intravenous contrast.  CONTRAST:  157m OMNIPAQUE IOHEXOL 300 MG/ML  SOLN  COMPARISON:  None.  FINDINGS: CT CHEST FINDINGS  THORACIC INLET/BODY WALL:  No acute abnormality.  MEDIASTINUM:  Normal heart size. No pericardial effusion. No acute vascular abnormality. No adenopathy.  LUNG WINDOWS:  No contusion, hemothorax, or pneumothorax.  4 mm pulmonary nodule in the upper lingula on image 28. 2 mm subpleural nodule on the right on image 34.  OSSEOUS:  See below  CT ABDOMEN AND PELVIS FINDINGS  BODY WALL: Unremarkable.  Liver: No focal abnormality.  Biliary: No evidence of biliary obstruction or stone.  Pancreas: Unremarkable.  Spleen: Unremarkable.  Adrenals: Unremarkable.  Kidneys and ureters: No traumatic findings.  Bladder: Unremarkable.  Reproductive: Enlarged prostate, projecting into the bladder base.  Bowel: No evidence of injury  Retroperitoneum: No mass or adenopathy.  Peritoneum: No free fluid or gas.  Vascular: No acute findings.  OSSEOUS: Nondisplaced posterior right ninth and tenth rib fractures.  Left C5 articular process fracture with bony fragment in the C5-6 foramen, as described on dedicated CT of the cervical spine. No cervical or thoracic spine fractures identified.  IMPRESSION: 1. Nondisplaced posterior right ninth and tenth rib fractures. 2. No evidence of intrathoracic or intra-abdominal injury. 3. 4 mm pulmonary nodule in the lingula. If the patient is at high risk for bronchogenic carcinoma, follow-up chest CT at 1 year is recommended. If the patient  is at low risk, no follow-up is needed. This recommendation follows the consensus statement: Guidelines for Management of Small Pulmonary Nodules Detected on CT Scans: A Statement from the [place] published in Radiology 2005; 237:395-400. 4. Enlarged prostate.   Electronically Signed   By: JMonte FantasiaM.D.   On: 11/09/2014 16:49   Dg Pelvis Portable  11/09/2014   CLINICAL DATA:  Patient fell from ladder  EXAM: PORTABLE PELVIS 1-2 VIEWS  COMPARISON:  None.  FINDINGS: There is no evidence of pelvic fracture or dislocation. Joint spaces appear intact. No erosive change.  IMPRESSION: No demonstrable fracture or dislocation. No appreciable arthropathy.   Electronically Signed   By: WLowella GripIII M.D.   On: 11/09/2014 15:55   Dg Chest Portable 1 View  11/09/2014   CLINICAL DATA:  Fall  EXAM: PORTABLE CHEST - 1 VIEW  COMPARISON:  None.  FINDINGS: Heart size at upper limits of normal. Lungs are grossly clear. Support apparatus artifact is present over the mid chest. No gross evidence for pneumothorax. No displaced fracture identified.  IMPRESSION: No gross evidence for acute cardiopulmonary process. Borderline cardiomegaly may in part be related to portable supine AP technique. Chest CT will be subsequently performed and dictated separately.   Electronically Signed   By: GConchita ParisM.D.   On: 11/09/2014 15:56   Ct Maxillofacial Wo Cm  11/09/2014   CLINICAL DATA:  Patient is status post fall with neck pain and bilateral shoulder pain. Patient not able to move lower extremities, with tingling in hands and arms.  EXAM: CT HEAD WITHOUT CONTRAST  TECHNIQUE: Contiguous axial images were obtained from the  vertex to the thoracic inlet without intravenous contrast.  COMPARISON:  None.  FINDINGS: CT of the head: No mass effect or midline shift. No evidence of acute intracranial hemorrhage, or infarction. No abnormal extra-axial fluid collections. Gray-white matter differentiation is normal. Basal  cisterns are preserved.  No depressed skull fractures. Visualized paranasal sinuses and mastoid air cells are not opacified. There is a soft tissue injury of the right forehead superior to the right orbit containing punctate hyperdense foreign body in a 2 mm gas bubble. No underlying osseous injury seen.  CT of the cervical spine: There is a comminuted minimally displaced fracture of the left inferior articular facet of C5 and minimally displaced fracture of the left superior articular facet of C6. A linear calcific fragment is seen within the L5 left neural foramen, which likely represents a fracture fragment. Additionally, there is a fracture of the posterior process of C4, with left lateral displacement of the distal fracture fragment. There is also a nondisplaced fracture of the posterior process of C5. The may be a tiny avulsion fracture off of the posterior facet of C6. There is no evidence of alignment abnormality. No evidence of epidural hematoma or spinal canal narrowing.  There are multilevel degenerative changes of the cervical spine with disc height loss, endplate sclerosis and posterior and anterior osteophytes. Ligamentous calcifications are also seen.  CT of the face: The globes and extraocular muscles appear symmetrical. No air fluid levels in the paranasal sinuses.  The frontal bones, orbital rims, maxillary antral walls, nasal bones, nasal septum, nasal spine, maxilla, pterygoid plates, zygomatic arches, temporomandibular joints, and mandibles appear intact.  No displaced fractures are identified. Visualized thyroid cartilage and hyoid bone appear intact.  IMPRESSION: No CT evidence of intracranial traumatic injury.  Comminuted fracture of left C5 articular facet, and minimally displaced fracture of the left C6 articular facet, with possible minute fracture fragment within C5 left neural foramen.  Fractures of C4, C5 and likely C6 posterior processes.  No evidence of epidural hematoma, or spinal  canal narrowing.  No evidence of facial fractures.  Given the severity of patient's symptoms, MRI of the cervical spine may be considered, when clinically feasible.  Key findings were discussed with Georganna Skeans at the completion of the exam.   Electronically Signed   By: Fidela Salisbury M.D.   On: 11/09/2014 17:17                             Assessment and Plan:   Carl [name] is an 64 y.o. male we are asked to evaluate for globe injury in the setting of recent trauma with:   -- No clinical evidence of significant globe injury.   -- Brow lacerations OD.    Discussed findings with the patient.  Recommend bedside closure of lacerations.   All of the above information was relayed to the patient and/or patient family.  Ophthalmic warning signs and symptoms were reviewed, and clear instructions for immediate phone contact and/or immediate return to the ED or clinic were provided should any of these signs or symptoms occur.  Follow up contact information was provided.  All questions were answered.   Procedure: repair of R brow lacerations.  Anesth: local 2% lido with epi EBL: < 3 cc.  Comps: none.  Specimens: none.   Area was cleaned with moistened guaze and then alcohol prep pad.  Local antesthesia administered with 30G needle.  Lacerations copiiously irrigated with NS  followed by betadine.  No paritculate or FB material seen.  Area then prepped and draped.  The main laceration was irregular.  It extended just below superior orbital rim and contents of superior orbit visible and appear intact.  ~8 6-0 vicryl sutures were used to reapproximate muslce and deep tissue.  6 X interrupted 6-0- fast absorbing used to closed skin.  Latetrally there was a puncture wound  This was explored and a white/gray FB was found and removed.  Single 6-0 vicryl to close deep;  2 X 6-0 fast to close skin.    Drapes removed.  Area cleaned.  Baci oph ung applied to sites.    Katy Apo 11/09/2014,  [facility] [phone]

## 2014-11-09 NOTE — Procedures (Signed)
Arterial Catheter Insertion Procedure Note Carl [name] [id number] 18-Sep-1950  Procedure: Insertion of Arterial Catheter  Indications: Blood pressure monitoring  Procedure Details Consent: Risks of procedure as well as the alternatives and risks of each were explained to the (patient/caregiver).  Consent for procedure obtained. and Unable to obtain consent because of emergent medical necessity. Time Out: Verified patient identification, verified procedure, site/side was marked, verified correct patient position, special equipment/implants available, medications/allergies/relevent history reviewed, required imaging and test results available.  Performed  Maximum sterile technique was used including antiseptics, cap, gloves, gown, hand hygiene and sheet. Skin prep: Chlorhexidine; local anesthetic administered 20 gauge catheter was inserted into right radial artery using the Seldinger technique.  Evaluation Blood flow good; BP tracing good. Complications: No apparent complications.   Leafy Half 11/09/2014

## 2014-11-09 NOTE — ED Notes (Addendum)
Per ems pt was working on his rood when he fell 10-15 ft and landed on a landing then fell another 10-15 ft landing on the ground. C/o neck pain and bilateral shoulder pain. Pt cannot move his lower extremities, tingling in hands and arms. Initial BP 120 palpated, then 86/34 with HR 58. Is alert x 4. Laceration to right elbow and above right eye

## 2014-11-09 NOTE — ED Notes (Signed)
In CT pt with low HR 33, low BP. Atropine given, dopamine started.

## 2014-11-09 NOTE — H&P (Addendum)
Carl [name] is an 64 y.o. male.   Chief Complaint: Cannot move legs after fall HPI: Chrishawn was painting a house and was up on the roof. He fell onto a lower roof and then fell again onto the ground. He struck his head during the fall. Afterwards, he was unable to move his legs. He was transported as a level II trauma but upgraded to level I in transport due to hypotension. On arrival he complains of inability to move his legs and that his trunk and arms feel tingly.  Past medical history: Bradycardia  History reviewed. No pertinent past surgical history.  No family history on file. Social History:  has no tobacco, alcohol, and drug history on file.  Allergies:  Allergies  Allergen Reactions  . Penicillins      (Not in a hospital admission)  Results for orders placed or performed during the hospital encounter of 11/09/14 (from the past 48 hour(s))  Prepare fresh frozen plasma     Status: None (Preliminary result)   Collection Time: 11/09/14  3:21 PM  Result Value Ref Range   Unit Number [id number]    Blood Component Type LIQ PLASMA    Unit division 00    Status of Unit ISSUED    Unit tag comment VERBAL ORDERS PER DR KOHUT    Transfusion Status OK TO TRANSFUSE    Unit Number [id number]    Blood Component Type LIQ PLASMA    Unit division 00    Status of Unit ISSUED    Unit tag comment VERBAL ORDERS PER DR Wilson Singer    Transfusion Status OK TO TRANSFUSE   Type and screen     Status: None (Preliminary result)   Collection Time: 11/09/14  3:30 PM  Result Value Ref Range   ABO/RH(D) O POS    Antibody Screen NEG    Sample Expiration 11/12/2014    Unit Number [id number]    Blood Component Type RBC LR PHER1    Unit division 00    Status of Unit ISSUED    Unit tag comment VERBAL ORDERS PER DR KOHUT    Transfusion Status OK TO TRANSFUSE    Crossmatch Result PENDING    Unit Number [id number]    Blood Component Type RED CELLS,LR    Unit division 00    Status of Unit  ISSUED    Unit tag comment VERBAL ORDERS PER DR KOHUT    Transfusion Status OK TO TRANSFUSE    Crossmatch Result PENDING   CDS serology     Status: None   Collection Time: 11/09/14  3:30 PM  Result Value Ref Range   CDS serology specimen      SPECIMEN WILL BE HELD FOR 14 DAYS IF TESTING IS REQUIRED  Comprehensive metabolic panel     Status: Abnormal   Collection Time: 11/09/14  3:30 PM  Result Value Ref Range   Sodium 139 135 - 145 mmol/L   Potassium 4.2 3.5 - 5.1 mmol/L   Chloride 106 101 - 111 mmol/L   CO2 23 22 - 32 mmol/L   Glucose, Bld 110 (H) 65 - 99 mg/dL   BUN 25 (H) 6 - 20 mg/dL   Creatinine, Ser 1.65 (H) 0.61 - 1.24 mg/dL   Calcium 8.8 (L) 8.9 - 10.3 mg/dL   Total Protein 5.9 (L) 6.5 - 8.1 g/dL   Albumin 3.8 3.5 - 5.0 g/dL   AST 49 (H) 15 - 41 U/L   ALT 29 17 - 63 U/L  Alkaline Phosphatase 66 38 - 126 U/L   Total Bilirubin 0.9 0.3 - 1.2 mg/dL   GFR calc non Af Amer 42 (L) >60 mL/min   GFR calc Af Amer 49 (L) >60 mL/min    Comment: (NOTE) The eGFR has been calculated using the CKD EPI equation. This calculation has not been validated in all clinical situations. eGFR's persistently <60 mL/min signify possible Chronic Kidney Disease.    Anion gap 10 5 - 15  CBC     Status: None   Collection Time: 11/09/14  3:30 PM  Result Value Ref Range   WBC 7.3 4.0 - 10.5 K/uL   RBC 4.50 4.22 - 5.81 MIL/uL   Hemoglobin 13.5 13.0 - 17.0 g/dL   HCT 39.2 39.0 - 52.0 %   MCV 87.1 78.0 - 100.0 fL   MCH 30.0 26.0 - 34.0 pg   MCHC 34.4 30.0 - 36.0 g/dL   RDW 13.0 11.5 - 15.5 %   Platelets 175 150 - 400 K/uL  Protime-INR     Status: None   Collection Time: 11/09/14  3:30 PM  Result Value Ref Range   Prothrombin Time 14.0 11.6 - 15.2 seconds   INR 1.06 0.00 - 1.49  ABO/Rh     Status: None   Collection Time: 11/09/14  3:30 PM  Result Value Ref Range   ABO/RH(D) O POS   Ethanol     Status: None   Collection Time: 11/09/14  3:32 PM  Result Value Ref Range   Alcohol, Ethyl (B)  <5 <5 mg/dL    Comment:        LOWEST DETECTABLE LIMIT FOR SERUM ALCOHOL IS 5 mg/dL FOR MEDICAL PURPOSES ONLY    Dg Pelvis Portable  11/09/2014   CLINICAL DATA:  Patient fell from ladder  EXAM: PORTABLE PELVIS 1-2 VIEWS  COMPARISON:  None.  FINDINGS: There is no evidence of pelvic fracture or dislocation. Joint spaces appear intact. No erosive change.  IMPRESSION: No demonstrable fracture or dislocation. No appreciable arthropathy.   Electronically Signed   By: Lowella Grip III M.D.   On: 11/09/2014 15:55   Dg Chest Portable 1 View  11/09/2014   CLINICAL DATA:  Fall  EXAM: PORTABLE CHEST - 1 VIEW  COMPARISON:  None.  FINDINGS: Heart size at upper limits of normal. Lungs are grossly clear. Support apparatus artifact is present over the mid chest. No gross evidence for pneumothorax. No displaced fracture identified.  IMPRESSION: No gross evidence for acute cardiopulmonary process. Borderline cardiomegaly may in part be related to portable supine AP technique. Chest CT will be subsequently performed and dictated separately.   Electronically Signed   By: Conchita Paris M.D.   On: 11/09/2014 15:56    Review of Systems  Constitutional: Negative.   HENT:       Forehead laceration over right eye  Eyes: Negative for blurred vision and double vision.  Respiratory: Negative.   Cardiovascular: Negative.   Gastrointestinal: Negative.   Genitourinary: Negative.   Musculoskeletal: Negative.   Skin: Negative.   Neurological: Positive for sensory change and focal weakness. Negative for loss of consciousness.       Sensory and motor changes as above  Endo/Heme/Allergies: Negative.   Psychiatric/Behavioral: Negative.     Blood pressure 142/71, pulse 82, temperature 98.2 F (36.8 C), resp. rate 20, height _0  (1.727 m), weight 88.451 kg (195 lb), SpO2 100 %. Physical Exam  Constitutional: He is oriented to person, place, and time. He appears well-developed  and well-nourished. No distress.  HENT:   Head: Head is with laceration.    Right Ear: Hearing, tympanic membrane, external ear and ear canal normal.  Left Ear: Hearing, tympanic membrane, external ear and ear canal normal.  Nose: No nasal deformity.  Mouth/Throat: Uvula is midline and oropharynx is clear and moist.  Complex laceration over right eye including the lid  Eyes: EOM are normal. Pupils are equal, round, and reactive to light.  Vision intact both eyes  Neck:  Collar in place, some posterior tenderness along the midline  Cardiovascular: Normal heart sounds and intact distal pulses.   Bradycardic in the 50s  Respiratory: No respiratory distress. He has no wheezes. He has no rales. He exhibits no tenderness.  Abdominal type breathing  GI: Soft. He exhibits no distension. There is no tenderness. There is no rebound and no guarding.  Genitourinary: Penis normal.  No rectal tone, no blood  Musculoskeletal:  Right elbow laceration 2 cm  Neurological: He is alert and oriented to person, place, and time. He displays no tremor. He displays no seizure activity. GCS eye subscore is 4. GCS verbal subscore is 5. GCS motor subscore is 6.  Bilateral upper extremities have 3 out of 5 biceps, 2 out of 5 triceps and deltoids bilaterally, no grip. Bilateral lower extremities no movement  Skin: Skin is warm.     Assessment/Plan While in CT he had hypotension and bradycardia requiring atropine and dopamine  Fall C5 facet and articular process fracture - Dr. Vertell Limber to see him in consultation Central cord spinal cord injury Multiple right rib fractures Bradycardia Neurogenic shock - dopamine R elbow lac - check x-ray and repair Complex right eyelid laceration - ophthalmology consult  Admit to ICU. Trauma PA spoke with his wife. Critical care 70 minutes including management of neurogenic shock Zeki Bedrosian E 11/09/2014, 4:41 PM

## 2014-11-09 NOTE — ED Notes (Signed)
Dr. Randon Goldsmith, ophthalmologist at bedside.

## 2014-11-09 NOTE — ED Notes (Signed)
[name] and daughter at bedside.

## 2014-11-09 NOTE — Progress Notes (Signed)
   11/09/14 1600  Clinical Encounter Type  Visited With Patient  Visit Type Initial;ED;Trauma  Spiritual Encounters  Spiritual Needs Emotional  Stress Factors  Patient Stress Factors Health changes  Family Stress Factors Health changes   Chaplain was paged to a level two trauma that was later upgraded to a level one. Patient sustained multiple injuries from a fall. Patient was alert and able to communicate with staff when he arrived. Patient is a Hydrologist. Patient's wife and daughters arrived to the hospital and chaplain helped escort them to a consultation room. Chaplain was present while Trauma physician consulted with family. When patient arrived back from CT, chaplain escorted patient's wife and oldest daughter to bedside. Other family members and friends continue to arrive to the hospital and can come to the consultation room at the family's discretion. Please page On-Call chaplain if patient or patient's family needs support.  Cranston Neighbor, Chaplain  4:59 PM

## 2014-11-09 NOTE — Procedures (Signed)
FAST  Pre-procedure diagnosis:hypotension after fall Post-procedure diagnosis:no abdominal free fluid, no pericardial effusion Procedure: FAST Surgeon: Violeta Gelinas, MD Procedure in detail: The patient's abdomen was imaged in 4 regions with the ultrasound. First, the right upper quadrant was imaged. No free fluid was seen between the right kidney and the liver in Morison's pouch. Next, the epigastrium was imaged. No significant pericardial effusion was seen. Next, the left upper quadrant was imaged. No free fluid was seen between the left kidney and the spleen. Finally, the bladder was imaged. No free fluid was seen next to the bladder in the pelvis. Impression: Negative  Violeta Gelinas, MD, MPH, FACS Trauma: [phone] General Surgery: [phone]

## 2014-11-09 NOTE — ED Provider Notes (Signed)
CSN: [id number]     Arrival date & time 11/09/14  1524 History   First MD Initiated Contact with Patient 11/09/14 1540     Chief Complaint  Patient presents with  . Trauma    HPI  Patient is a 64 year old male with past medical history of bradycardia who presents today after sustaining trauma when he fell from a roof. Patient relates this occurred prior to arrival he was painting his house when he fell off of an upper roof and approximately 10-15 feet down and landed on a lower roof and then sexually fell 10-15 feet further onto the ground. Patient was not able to ambulate afterwards. He denies any loss of consciousness with this. EMS does note the patient is unable to move his legs on their arrival however initially he was hemodynamically stable. Later in route EMS notes that the patient's systolic blood pressure dropped below 90. They also noted that the patient had a slower heart rate as well. Patient didn't stay awake and alert throughout this time. Patient currently endorses tingling in his lower legs and inability to move his lower legs bilaterally. He also endorses tingling in bilateral upper extremities as well with weakened strength on both sides. He also endorses tingling along his abdomen as well. He currently denies any neck pain or back pain.  Patient does notably have abrasions to the left posterior elbow and also an abrasion with small laceration to the right elbow as well. He also has a large laceration above the right eye as well along the eyebrow and appears to possibly be extending down towards the eyelid and the orbit.  History reviewed. No pertinent past medical history. History reviewed. No pertinent past surgical history. No family history on file. History  Substance Use Topics  . Smoking status: Not on file  . Smokeless tobacco: Not on file  . Alcohol Use: Not on file    Review of Systems  Constitutional: Negative for fever and chills.  HENT: Negative for congestion and  rhinorrhea.   Gastrointestinal: Negative for nausea and vomiting.  Genitourinary: Negative for dysuria and hematuria.  Musculoskeletal: Negative for arthralgias and gait problem.  Skin: Positive for wound. Negative for rash.  Neurological: Positive for weakness (bilateral lower extremities with minimal movement) and numbness (numbness in lower extremities bilaterally, tingling in distal UE bilaterally).  Psychiatric/Behavioral: Negative for confusion and agitation.  All other systems reviewed and are negative.     Allergies  Penicillins  Home Medications   Prior to Admission medications   Medication Sig Start Date End Date Taking? Authorizing Provider  aspirin EC 81 MG tablet Take 81 mg by mouth daily.   Yes Historical Provider, MD  ibuprofen (ADVIL,MOTRIN) 200 MG tablet Take 200 mg by mouth every 6 (six) hours as needed for headache, mild pain or moderate pain.   Yes Historical Provider, MD  Saw Palmetto, Serenoa repens, (SAW PALMETTO PO) Take 1 tablet by mouth daily.   Yes Historical Provider, MD   BP 96/47 mmHg  Pulse 47  Temp(Src) 98.6 F (37 C) (Oral)  Resp 12  Ht  (1.727 m)  Wt 195 lb (88.451 kg)  BMI 29.66 kg/m2  SpO2 100% Physical Exam  Constitutional: He is oriented to person, place, and time. He appears well-developed and well-nourished. No distress.  HENT:  Head: Normocephalic and atraumatic.  Eyes: Conjunctivae and EOM are normal.  Neck: Normal range of motion. Neck supple.  Pulmonary/Chest: He is in respiratory distress. He has no wheezes. He  has no rales.  Paradoxical breathing with abdomen noted   Abdominal: Soft. He exhibits no distension. There is no tenderness. There is no rebound.  Musculoskeletal: He exhibits no tenderness.  Neurological: He is alert and oriented to person, place, and time. A sensory deficit (lower extremities bilaterally) is present. No cranial nerve deficit. GCS eye subscore is 4. GCS verbal subscore is 5. GCS motor subscore is 6.   Decreased strength and lower extremities bilaterally  Skin: Laceration (deep laceration noted above R eyelid) noted. He is not diaphoretic.  Psychiatric: He has a normal mood and affect. His behavior is normal.  Nursing note and vitals reviewed.  Patient has no focal tenderness along the cervical, thoracic, lumbar spine. No palpable step-offs. Patient had flaccid rectal tone.  ED Course  CRITICAL CARE Performed by: Madolyn Frieze Authorized by: Raeford Razor Total critical care time: 30 minutes Critical care was necessary to treat or prevent imminent or life-threatening deterioration of the following conditions: trauma. Critical care was time spent personally by me on the following activities: development of treatment plan with patient or surrogate, examination of patient, obtaining history from patient or surrogate, ordering and performing treatments and interventions, ordering and review of laboratory studies, ordering and review of radiographic studies and re-evaluation of patient's condition.   (including critical care time) Labs Review Labs Reviewed  COMPREHENSIVE METABOLIC PANEL - Abnormal; Notable for the following:    Glucose, Bld 110 (*)    BUN 25 (*)    Creatinine, Ser 1.65 (*)    Calcium 8.8 (*)    Total Protein 5.9 (*)    AST 49 (*)    GFR calc non Af Amer 42 (*)    GFR calc Af Amer 49 (*)    All other components within normal limits  URINALYSIS, ROUTINE W REFLEX MICROSCOPIC (NOT AT [facility]) - Abnormal; Notable for the following:    Hgb urine dipstick MODERATE (*)    All other components within normal limits  CBC - Abnormal; Notable for the following:    WBC 15.9 (*)    HCT 38.8 (*)    All other components within normal limits  BASIC METABOLIC PANEL - Abnormal; Notable for the following:    Glucose, Bld 151 (*)    Calcium 8.4 (*)    All other components within normal limits  MRSA PCR SCREENING  CDS SEROLOGY  CBC  ETHANOL  PROTIME-INR  URINE MICROSCOPIC-ADD ON   CBC WITH DIFFERENTIAL/PLATELET  BASIC METABOLIC PANEL  TYPE AND SCREEN  PREPARE FRESH FROZEN PLASMA  ABO/RH  BLOOD PRODUCT ORDER (VERBAL) VERIFICATION    Imaging Review Dg Elbow Complete Right  11/09/2014   CLINICAL DATA:  Fall with posterior elbow laceration and pain.  EXAM: RIGHT ELBOW - COMPLETE 3+ VIEW  COMPARISON:  None.  FINDINGS: Soft tissue swelling around the olecranon with faint foreign body measuring 2 mm. There is also a metallic appearing foreign body at the olecranon medially measuring 3 mm. No evidence of fracture, dislocation, or joint effusion.  Note that positioning is nonstandard due to patient condition, and this best obtainable exam is decreasing sensitivity.  IMPRESSION: 1. Two foreign bodies about the olecranon, up to 3 mm. 2. No evidence of fracture.   Electronically Signed   By: Marnee Spring M.D.   On: 11/09/2014 18:26   Ct Head Wo Contrast  11/09/2014   CLINICAL DATA:  Patient is status post fall with neck pain and bilateral shoulder pain. Patient not able to move lower extremities,  with tingling in hands and arms.  EXAM: CT HEAD WITHOUT CONTRAST  TECHNIQUE: Contiguous axial images were obtained from the vertex to the thoracic inlet without intravenous contrast.  COMPARISON:  None.  FINDINGS: CT of the head: No mass effect or midline shift. No evidence of acute intracranial hemorrhage, or infarction. No abnormal extra-axial fluid collections. Gray-white matter differentiation is normal. Basal cisterns are preserved.  No depressed skull fractures. Visualized paranasal sinuses and mastoid air cells are not opacified. There is a soft tissue injury of the right forehead superior to the right orbit containing punctate hyperdense foreign body in a 2 mm gas bubble. No underlying osseous injury seen.  CT of the cervical spine: There is a comminuted minimally displaced fracture of the left inferior articular facet of C5 and minimally displaced fracture of the left superior articular  facet of C6. A linear calcific fragment is seen within the L5 left neural foramen, which likely represents a fracture fragment. Additionally, there is a fracture of the posterior process of C4, with left lateral displacement of the distal fracture fragment. There is also a nondisplaced fracture of the posterior process of C5. The may be a tiny avulsion fracture off of the posterior facet of C6. There is no evidence of alignment abnormality. No evidence of epidural hematoma or spinal canal narrowing.  There are multilevel degenerative changes of the cervical spine with disc height loss, endplate sclerosis and posterior and anterior osteophytes. Ligamentous calcifications are also seen.  CT of the face: The globes and extraocular muscles appear symmetrical. No air fluid levels in the paranasal sinuses.  The frontal bones, orbital rims, maxillary antral walls, nasal bones, nasal septum, nasal spine, maxilla, pterygoid plates, zygomatic arches, temporomandibular joints, and mandibles appear intact.  No displaced fractures are identified. Visualized thyroid cartilage and hyoid bone appear intact.  IMPRESSION: No CT evidence of intracranial traumatic injury.  Comminuted fracture of left C5 articular facet, and minimally displaced fracture of the left C6 articular facet, with possible minute fracture fragment within C5 left neural foramen.  Fractures of C4, C5 and likely C6 posterior processes.  No evidence of epidural hematoma, or spinal canal narrowing.  No evidence of facial fractures.  Given the severity of patient's symptoms, MRI of the cervical spine may be considered, when clinically feasible.  Key findings were discussed with Violeta Gelinas at the completion of the exam.   Electronically Signed   By: Ted Mcalpine M.D.   On: 11/09/2014 17:17   Ct Chest W Contrast  11/09/2014   CLINICAL DATA:  Fall.  Cannot move lower extremities.  EXAM: CT CHEST, ABDOMEN, AND PELVIS WITH CONTRAST  TECHNIQUE: Multidetector  CT imaging of the chest, abdomen and pelvis was performed following the standard protocol during bolus administration of intravenous contrast.  CONTRAST:  OMNIPAQUE IOHEXOL 300 MG/ML  SOLN  COMPARISON:  None.  FINDINGS: CT CHEST FINDINGS  THORACIC INLET/BODY WALL:  No acute abnormality.  MEDIASTINUM:  Normal heart size. No pericardial effusion. No acute vascular abnormality. No adenopathy.  LUNG WINDOWS:  No contusion, hemothorax, or pneumothorax.  4 mm pulmonary nodule in the upper lingula on image 28. 2 mm subpleural nodule on the right on image 34.  OSSEOUS:  See below  CT ABDOMEN AND PELVIS FINDINGS  BODY WALL: Unremarkable.  Liver: No focal abnormality.  Biliary: No evidence of biliary obstruction or stone.  Pancreas: Unremarkable.  Spleen: Unremarkable.  Adrenals: Unremarkable.  Kidneys and ureters: No traumatic findings.  Bladder: Unremarkable.  Reproductive: Enlarged  prostate, projecting into the bladder base.  Bowel: No evidence of injury  Retroperitoneum: No mass or adenopathy.  Peritoneum: No free fluid or gas.  Vascular: No acute findings.  OSSEOUS: Nondisplaced posterior right ninth and tenth rib fractures.  Left C5 articular process fracture with bony fragment in the C5-6 foramen, as described on dedicated CT of the cervical spine. No cervical or thoracic spine fractures identified.  IMPRESSION: 1. Nondisplaced posterior right ninth and tenth rib fractures. 2. No evidence of intrathoracic or intra-abdominal injury. 3. 4 mm pulmonary nodule in the lingula. If the patient is at high risk for bronchogenic carcinoma, follow-up chest CT at 1 year is recommended. If the patient is at low risk, no follow-up is needed. This recommendation follows the consensus statement: Guidelines for Management of Small Pulmonary Nodules Detected on CT Scans: A Statement from the Fleischner Society as published in Radiology 2005; 237:395-400. 4. Enlarged prostate.   Electronically Signed   By: Marnee Spring M.D.    On: 11/09/2014 16:49   Ct Cervical Spine Wo Contrast  11/09/2014   CLINICAL DATA:  Patient is status post fall with neck pain and bilateral shoulder pain. Patient not able to move lower extremities, with tingling in hands and arms.  EXAM: CT HEAD WITHOUT CONTRAST  TECHNIQUE: Contiguous axial images were obtained from the vertex to the thoracic inlet without intravenous contrast.  COMPARISON:  None.  FINDINGS: CT of the head: No mass effect or midline shift. No evidence of acute intracranial hemorrhage, or infarction. No abnormal extra-axial fluid collections. Gray-white matter differentiation is normal. Basal cisterns are preserved.  No depressed skull fractures. Visualized paranasal sinuses and mastoid air cells are not opacified. There is a soft tissue injury of the right forehead superior to the right orbit containing punctate hyperdense foreign body in a 2 mm gas bubble. No underlying osseous injury seen.  CT of the cervical spine: There is a comminuted minimally displaced fracture of the left inferior articular facet of C5 and minimally displaced fracture of the left superior articular facet of C6. A linear calcific fragment is seen within the L5 left neural foramen, which likely represents a fracture fragment. Additionally, there is a fracture of the posterior process of C4, with left lateral displacement of the distal fracture fragment. There is also a nondisplaced fracture of the posterior process of C5. The may be a tiny avulsion fracture off of the posterior facet of C6. There is no evidence of alignment abnormality. No evidence of epidural hematoma or spinal canal narrowing.  There are multilevel degenerative changes of the cervical spine with disc height loss, endplate sclerosis and posterior and anterior osteophytes. Ligamentous calcifications are also seen.  CT of the face: The globes and extraocular muscles appear symmetrical. No air fluid levels in the paranasal sinuses.  The frontal bones, orbital  rims, maxillary antral walls, nasal bones, nasal septum, nasal spine, maxilla, pterygoid plates, zygomatic arches, temporomandibular joints, and mandibles appear intact.  No displaced fractures are identified. Visualized thyroid cartilage and hyoid bone appear intact.  IMPRESSION: No CT evidence of intracranial traumatic injury.  Comminuted fracture of left C5 articular facet, and minimally displaced fracture of the left C6 articular facet, with possible minute fracture fragment within C5 left neural foramen.  Fractures of C4, C5 and likely C6 posterior processes.  No evidence of epidural hematoma, or spinal canal narrowing.  No evidence of facial fractures.  Given the severity of patient's symptoms, MRI of the cervical spine may be considered, when clinically  feasible.  Key findings were discussed with Violeta Gelinas at the completion of the exam.   Electronically Signed   By: Ted Mcalpine M.D.   On: 11/09/2014 17:17   Mr Cervical Spine Wo Contrast  11/10/2014   CLINICAL DATA:  Fall from roof. Tingling in the arms and fingers. Patient unable to move legs. Follow-up abnormal CT.  EXAM: MRI CERVICAL SPINE WITHOUT CONTRAST  TECHNIQUE: Multiplanar, multisequence MR imaging of the cervical spine was performed. No intravenous contrast was administered.  COMPARISON:  Cervical spine CT 11/09/2014.  FINDINGS: Alignment: Mildly exaggerated cervical lordosis. No spondylolisthesis.  Vertebrae: Bone marrow edema around the fractures at C5 and C6 is poorly visible on the inversion recovery images, partially due to technical factors. No aggressive osseous lesions.  Cord: The upper cervical cord appears normal. Abnormal cord signal is present from C5 through C6, best seen on inversion recovery images. On gradient echo images, there is an intramedullary hematoma dorsal the C6 measuring about 8 mm. This is more difficult to appreciate on the axial image sets. Cervical cord is colon compatible with cord contusion.  Posterior  Fossa: Normal.  Vertebral Arteries: Flow voids present in both vertebral arteries.  Paraspinal tissues: There is both prevertebral edema and posterior paraspinal edema associated with cervical spine fractures. Extensive cervical strain is present with posterior paraspinal muscular edema. No large paraspinal muscular hematoma is identified. Although these images are mildly technically degraded, on the inversion recovery images, there appears to be discontinuity of the anterior longitudinal ligament at C5-C6 consistent with a tear. There is also probably posterior longitudinal ligament disruption given the cord injury.  Disc Signal: Diffuse disc desiccation.  Disc levels:  C2-C3:  Negative.  C3-C4: Bilateral foraminal stenosis associated with uncovertebral spurring. Central canal patent.  C4-C5: Broad-based disc osteophyte complex and posterior ligamentum flavum redundancy produce mild central stenosis. There is bilateral uncovertebral spurring producing bilateral foraminal stenosis.  C5-C6: Cord swelling is present at this level. Superimposed degenerative disease is present with broad-based disc osteophyte complex. In conjunction with the cord swelling, there is almost complete effacement of the subarachnoid space at C5-C6 disc level. There is LEFT-greater-than-RIGHT bilateral foraminal stenosis secondary to uncovertebral spurring and the LEFT-sided C6 superior articular process fracture probably contributes to RIGHT foraminal stenosis.  C6-C7: Severe disc degeneration with shallow disc osteophyte complex producing mild central stenosis. Mild bilateral foraminal stenosis secondary to uncovertebral spurring.  C7-T1:  Bilateral facet arthrosis without stenosis.  IMPRESSION: 1. Cervical cord contusion extending dorsal to C5 and C6 vertebrae. intramedullary hematoma measuring 8 mm dorsal to the C6 vertebra. Combination of cord swelling and degenerative disease effaces the subarachnoid space at the level of the cord  contusion/hematoma. 2. Moderate multilevel cervical spondylosis detailed above. 3. LEFT eccentric cervical strain associated with previously demonstrated cervical spine fracture. 4. Critical Value/emergent results were called by telephone at the time of interpretation on 11/10/2014 at 1:56 pm to Dr. Jimmye Norman , who verbally acknowledged these results.   Electronically Signed   By: Andreas Newport M.D.   On: 11/10/2014 13:56   Ct Abdomen Pelvis W Contrast  11/09/2014   CLINICAL DATA:  Fall.  Cannot move lower extremities.  EXAM: CT CHEST, ABDOMEN, AND PELVIS WITH CONTRAST  TECHNIQUE: Multidetector CT imaging of the chest, abdomen and pelvis was performed following the standard protocol during bolus administration of intravenous contrast.  CONTRAST:  OMNIPAQUE IOHEXOL 300 MG/ML  SOLN  COMPARISON:  None.  FINDINGS: CT CHEST FINDINGS  THORACIC INLET/BODY WALL:  No acute abnormality.  MEDIASTINUM:  Normal heart size. No pericardial effusion. No acute vascular abnormality. No adenopathy.  LUNG WINDOWS:  No contusion, hemothorax, or pneumothorax.  4 mm pulmonary nodule in the upper lingula on image 28. 2 mm subpleural nodule on the right on image 34.  OSSEOUS:  See below  CT ABDOMEN AND PELVIS FINDINGS  BODY WALL: Unremarkable.  Liver: No focal abnormality.  Biliary: No evidence of biliary obstruction or stone.  Pancreas: Unremarkable.  Spleen: Unremarkable.  Adrenals: Unremarkable.  Kidneys and ureters: No traumatic findings.  Bladder: Unremarkable.  Reproductive: Enlarged prostate, projecting into the bladder base.  Bowel: No evidence of injury  Retroperitoneum: No mass or adenopathy.  Peritoneum: No free fluid or gas.  Vascular: No acute findings.  OSSEOUS: Nondisplaced posterior right ninth and tenth rib fractures.  Left C5 articular process fracture with bony fragment in the C5-6 foramen, as described on dedicated CT of the cervical spine. No cervical or thoracic spine fractures identified.  IMPRESSION: 1.  Nondisplaced posterior right ninth and tenth rib fractures. 2. No evidence of intrathoracic or intra-abdominal injury. 3. 4 mm pulmonary nodule in the lingula. If the patient is at high risk for bronchogenic carcinoma, follow-up chest CT at 1 year is recommended. If the patient is at low risk, no follow-up is needed. This recommendation follows the consensus statement: Guidelines for Management of Small Pulmonary Nodules Detected on CT Scans: A Statement from the Fleischner Society as published in Radiology 2005; 237:395-400. 4. Enlarged prostate.   Electronically Signed   By: Marnee Spring M.D.   On: 11/09/2014 16:49   Dg Pelvis Portable  11/09/2014   CLINICAL DATA:  Patient fell from ladder  EXAM: PORTABLE PELVIS 1-2 VIEWS  COMPARISON:  None.  FINDINGS: There is no evidence of pelvic fracture or dislocation. Joint spaces appear intact. No erosive change.  IMPRESSION: No demonstrable fracture or dislocation. No appreciable arthropathy.   Electronically Signed   By: Bretta Bang III M.D.   On: 11/09/2014 15:55   Dg Chest Port 1 View  11/10/2014   CLINICAL DATA:  Subsequent encounter for multiple rib fractures  EXAM: PORTABLE CHEST - 1 VIEW  COMPARISON:  11/09/2014.  FINDINGS: 0615 hours. Lung volumes are low without edema, focal airspace consolidation, or pleural effusion. Cardiopericardial silhouette is at upper limits of normal for size. Telemetry leads overlie the chest. Prominent gastric bubble noted over the left upper quadrant of the abdomen. Posterior right-sided rib fracture seen on recent CT not evident on x-ray.  IMPRESSION: Low volume film without acute cardiopulmonary findings.   Electronically Signed   By: Kennith Center M.D.   On: 11/10/2014 08:00   Dg Chest Portable 1 View  11/09/2014   CLINICAL DATA:  Fall  EXAM: PORTABLE CHEST - 1 VIEW  COMPARISON:  None.  FINDINGS: Heart size at upper limits of normal. Lungs are grossly clear. Support apparatus artifact is present over the mid chest. No  gross evidence for pneumothorax. No displaced fracture identified.  IMPRESSION: No gross evidence for acute cardiopulmonary process. Borderline cardiomegaly may in part be related to portable supine AP technique. Chest CT will be subsequently performed and dictated separately.   Electronically Signed   By: Christiana Pellant M.D.   On: 11/09/2014 15:56   Ct Maxillofacial Wo Cm  11/09/2014   CLINICAL DATA:  Patient is status post fall with neck pain and bilateral shoulder pain. Patient not able to move lower extremities, with tingling in hands and arms.  EXAM:  CT HEAD WITHOUT CONTRAST  TECHNIQUE: Contiguous axial images were obtained from the vertex to the thoracic inlet without intravenous contrast.  COMPARISON:  None.  FINDINGS: CT of the head: No mass effect or midline shift. No evidence of acute intracranial hemorrhage, or infarction. No abnormal extra-axial fluid collections. Gray-white matter differentiation is normal. Basal cisterns are preserved.  No depressed skull fractures. Visualized paranasal sinuses and mastoid air cells are not opacified. There is a soft tissue injury of the right forehead superior to the right orbit containing punctate hyperdense foreign body in a 2 mm gas bubble. No underlying osseous injury seen.  CT of the cervical spine: There is a comminuted minimally displaced fracture of the left inferior articular facet of C5 and minimally displaced fracture of the left superior articular facet of C6. A linear calcific fragment is seen within the L5 left neural foramen, which likely represents a fracture fragment. Additionally, there is a fracture of the posterior process of C4, with left lateral displacement of the distal fracture fragment. There is also a nondisplaced fracture of the posterior process of C5. The may be a tiny avulsion fracture off of the posterior facet of C6. There is no evidence of alignment abnormality. No evidence of epidural hematoma or spinal canal narrowing.  There are  multilevel degenerative changes of the cervical spine with disc height loss, endplate sclerosis and posterior and anterior osteophytes. Ligamentous calcifications are also seen.  CT of the face: The globes and extraocular muscles appear symmetrical. No air fluid levels in the paranasal sinuses.  The frontal bones, orbital rims, maxillary antral walls, nasal bones, nasal septum, nasal spine, maxilla, pterygoid plates, zygomatic arches, temporomandibular joints, and mandibles appear intact.  No displaced fractures are identified. Visualized thyroid cartilage and hyoid bone appear intact.  IMPRESSION: No CT evidence of intracranial traumatic injury.  Comminuted fracture of left C5 articular facet, and minimally displaced fracture of the left C6 articular facet, with possible minute fracture fragment within C5 left neural foramen.  Fractures of C4, C5 and likely C6 posterior processes.  No evidence of epidural hematoma, or spinal canal narrowing.  No evidence of facial fractures.  Given the severity of patient's symptoms, MRI of the cervical spine may be considered, when clinically feasible.  Key findings were discussed with Violeta Gelinas at the completion of the exam.   Electronically Signed   By: Ted Mcalpine M.D.   On: 11/09/2014 17:17     EKG Interpretation   Date/Time:  Thursday November 09 2014 15:27:51 EDT Ventricular Rate:  52 PR Interval:  116 QRS Duration: 84 QT Interval:  446 QTC Calculation: 415 R Axis:   35 Text Interpretation:  Sinus bradycardia Borderline short PR interval RSR'  in V1 or V2, probably normal variant Nonspecific T abnrm No old tracing to  compare ED PHYSICIAN INTERPRETATION AVAILABLE IN [facility] Confirmed  by TEST, Record ([id number]) on 11/10/2014 7:31:47 AM      MDM   Final diagnoses:  Multiple rib fractures  Central cord syndrome at C5 level of cervical spinal cord, initial encounter    Patient is a 64 year old male with past medical history as noted  below. Presents today status post fall from roof. Patient does notably have significant neurological deficits in the bilateral lower extremities and this includes both motor and sensory deficits. Patient also had laceration above the right eyebrow as well which required consultation ophthalmology. Patient's injury is as noted above includes cervical spine fractures and multiple right rib fractures as well.  Neurosurgery consultation and they're aware of the patient's injuries. Trauma surgery also consultation and patient will be admitted for further evaluation and management. Patient is aware of the plan and current time and is in agreement with this. Patient cervical spine fractures including C4-5 and 6 which would be consistent with diaphragmatic deficits noted on exam. Patient was stable at the time of transfer to the trauma service. Blood pressure was notably hypotensive at times however majority of time here in the resuscitation bay patient was with systolics in the low 100s or upper 90s.    Madolyn Frieze, MD 11/11/14 1500  Raeford Razor, MD 11/12/14 [phone]

## 2014-11-09 NOTE — ED Provider Notes (Signed)
I saw and evaluated the patient, reviewed the resident's note and I agree with the findings and plan.   EKG Interpretation None      63yM fall from roof. Was painting his house when fell ~10-15 feet to a lower roof and then fell an additional 10-15 feet to ground. Unclear if had LOC. C/o neck pain and inability to move LE. Initially level 2 trauma, then upgraded to Level 1 because of hypotension. On arrival pt awake and alert. GCS 15. Airway intact. Breath sounds clear b/l, but poor air movement and little discernable chest rise. Manual BPs 80/50s in both UE. HR sinus brady in 50s. Abdomen soft. NT. No distension. Bedside US by Dr Janee Morn negative. Strength 0/5 b/l LE. No patellar reflexes. Decreased grip strength b/l. Able to barely overcome gravity and lift L arm. Able to flex/ext both wrists. No midline spinal tenderness. Poor rectal tone. Macerated laceration to R upper eyelid concerning for possible entrance into orbit. Pupils 3mm and reactive. EOMI. No obvious hyphema. Laceration with surrounding abrasion to R elbow.  Likely high spinal cord injury. Poor respiratory effort, but patent airway and maintaining o2 sats. No need for emergent intubation at this time, but concerning that he may end up needing intubation or trach later. Bradycardia/hypotension likely from neurogenic shock. IVF for now. May need inotropic/chronotropic support. CT head, max/fac, c-spine. CT chest addomen and pelvis with mechanism and to evaluate for other possible causes of hypotension. XR R elbow. Opthalmology consultation for facial laceration. Further consultation pending imaging results.   CRITICAL CARE Performed by: Raeford Razor Total critical care time: 35 minutes Critical care time was exclusive of separately billable procedures and treating other patients. Critical care was necessary to treat or prevent imminent or life-threatening deterioration. Critical care was time spent personally by me on the following  activities: development of treatment plan with patient and/or surrogate as well as nursing, discussions with consultants, evaluation of patient's response to treatment, examination of patient, obtaining history from patient or surrogate, ordering and performing treatments and interventions, ordering and review of laboratory studies, ordering and review of radiographic studies, pulse oximetry and re-evaluation of patient's condition.   Raeford Razor, MD 11/11/14 [phone]

## 2014-11-09 NOTE — Procedures (Signed)
Procedure note Pre-procedure diagnosis: 4cm R elbow laceration with foreign body Post-procedure diagnosis: same Procedure: irrigation 4cm R elbow laceration, removal of foreign body, simple closure 4cm laceration Surgeon: Violeta Gelinas, MD Assist: Bevely Palmer, PAS Procedure: the wound was copiously irrigated with betadine and saline. A small rock C/W the x-ray finding was removed. The lac was then closed with staples. He tolerated this well. Violeta Gelinas, MD, MPH, FACS Trauma: [phone] General Surgery: [phone]

## 2014-11-09 NOTE — ED Notes (Signed)
Dr. Randon Goldsmith at bedside suturing right eye laceration, reports he applied drops that will show his eyes dilated for next several hours but should be equal.

## 2014-11-09 NOTE — Progress Notes (Signed)
Patient ID: Carl [name], male   DOB: 1950-09-20, 64 y.o.   MRN: [id number] R elbow lac closed and FB removed. See note. R corner of mouth lac 5mm - no sutures needed. I spoke with his wife and daughter at the bedside.  Violeta Gelinas, MD, MPH, FACS Trauma: [phone] General Surgery: [phone]  11/09/2014 6:51 PM

## 2014-11-10 ENCOUNTER — Inpatient Hospital Stay (HOSPITAL_COMMUNITY): Payer: Federal, State, Local not specified - PPO

## 2014-11-10 ENCOUNTER — Other Ambulatory Visit: Payer: Self-pay | Admitting: Neurosurgery

## 2014-11-10 LAB — BASIC METABOLIC PANEL
Anion gap: 7 (ref 5–15)
BUN: 18 mg/dL (ref 6–20)
CO2: 28 mmol/L (ref 22–32)
CREATININE: 1.18 mg/dL (ref 0.61–1.24)
Calcium: 8.4 mg/dL — ABNORMAL LOW (ref 8.9–10.3)
Chloride: 104 mmol/L (ref 101–111)
GFR calc Af Amer: >60 mL/min (ref >60)
GFR calc non Af Amer: >60 mL/min (ref >60)
GLUCOSE: 151 mg/dL — AB (ref 65–99)
Potassium: 4.3 mmol/L (ref 3.5–5.1)
Sodium: 139 mmol/L (ref 135–145)

## 2014-11-10 LAB — CBC
HCT: 38.8 % — ABNORMAL LOW (ref 39.0–52.0)
Hemoglobin: 13.4 g/dL (ref 13.0–17.0)
MCH: 30.7 pg (ref 26.0–34.0)
MCHC: 34.5 g/dL (ref 30.0–36.0)
MCV: 89 fL (ref 78.0–100.0)
PLATELETS: 199 10*3/uL (ref 150–400)
RBC: 4.36 MIL/uL (ref 4.22–5.81)
RDW: 13.1 % (ref 11.5–15.5)
WBC: 15.9 10*3/uL — ABNORMAL HIGH (ref 4.0–10.5)

## 2014-11-10 LAB — BLOOD PRODUCT ORDER (VERBAL) VERIFICATION

## 2014-11-10 MED ORDER — CETYLPYRIDINIUM CHLORIDE 0.05 % MT LIQD
7.0000 mL | Freq: Two times a day (BID) | OROMUCOSAL | Status: DC
  Administered 2014-11-11 – 2014-11-14 (×6): 7 mL via OROMUCOSAL

## 2014-11-10 MED ORDER — SODIUM CHLORIDE 0.9 % IJ SOLN
10.0000 mL | INTRAMUSCULAR | Status: DC | PRN
  Administered 2014-11-16 – 2014-11-20 (×7): 10 mL
  Filled 2014-11-10 (×7): qty 40

## 2014-11-10 MED ORDER — CHLORHEXIDINE GLUCONATE 0.12% ORAL RINSE (MEDLINE KIT)
15.0000 mL | Freq: Two times a day (BID) | OROMUCOSAL | Status: DC
  Administered 2014-11-10 – 2014-11-15 (×6): 15 mL via OROMUCOSAL

## 2014-11-10 MED ORDER — SODIUM CHLORIDE 0.9 % IJ SOLN
10.0000 mL | Freq: Two times a day (BID) | INTRAMUSCULAR | Status: DC
  Administered 2014-11-10 – 2014-11-14 (×8): 10 mL
  Administered 2014-11-14: 30 mL
  Administered 2014-11-15: 20 mL
  Administered 2014-11-15 – 2014-11-18 (×5): 10 mL

## 2014-11-10 NOTE — Progress Notes (Signed)
Subjective: Patient reports feeling the same.  Objective: Vital signs in last 24 hours: Temp:  [98.2 F (36.8 C)-100.4 F (38 C)] 99.3 F (37.4 C) (08/05 0737) Pulse Rate:  [41-86] 50 (08/05 0845) Resp:  [9-27] 16 (08/05 0845) BP: (82-164)/(35-90) 120/46 mmHg (08/05 0830) SpO2:  [89 %-100 %] 98 % (08/05 0845) Arterial Line BP: (81-150)/(35-59) 81/41 mmHg (08/05 0845) Weight:  [88.451 kg (195 lb)] 88.451 kg (195 lb) (08/04 1550)  Intake/Output from previous day: 08/04 0701 - 08/05 0700 In: 5627.7 [P.O.:1815; I.V.:1812.7] Out: 2870 [Urine:2870] Intake/Output this shift:    Physical Exam: Good bilateral deltoid, biceps, triceps strength.  Extremely weak hands, left a bit better than right.  Legs remain weak.  Barely AG right lower leg with weak movement both feet, right greater than left.  Sensory deficits stable.  Lab Results:  Recent Labs  11/09/14 1530 11/10/14 0600  WBC 7.3 15.9*  HGB 13.5 13.4  HCT 39.2 38.8*  PLT 175 199   BMET  Recent Labs  11/09/14 1530 11/10/14 0600  NA 139 139  K 4.2 4.3  CL 106 104  CO2 23 28  GLUCOSE 110* 151*  BUN 25* 18  CREATININE 1.65* 1.18  CALCIUM 8.8* 8.4*    Studies/Results: Dg Elbow Complete Right  11/09/2014   CLINICAL DATA:  Fall with posterior elbow laceration and pain.  EXAM: RIGHT ELBOW - COMPLETE 3+ VIEW  COMPARISON:  None.  FINDINGS: Soft tissue swelling around the olecranon with faint foreign body measuring 2 mm. There is also a metallic appearing foreign body at the olecranon medially measuring 3 mm. No evidence of fracture, dislocation, or joint effusion.  Note that positioning is nonstandard due to patient condition, and this best obtainable exam is decreasing sensitivity.  IMPRESSION: 1. Two foreign bodies about the olecranon, up to 3 mm. 2. No evidence of fracture.   Electronically Signed   By: Marnee Spring M.D.   On: 11/09/2014 18:26   Ct Head Wo Contrast  11/09/2014   CLINICAL DATA:  Patient is status post  fall with neck pain and bilateral shoulder pain. Patient not able to move lower extremities, with tingling in hands and arms.  EXAM: CT HEAD WITHOUT CONTRAST  TECHNIQUE: Contiguous axial images were obtained from the vertex to the thoracic inlet without intravenous contrast.  COMPARISON:  None.  FINDINGS: CT of the head: No mass effect or midline shift. No evidence of acute intracranial hemorrhage, or infarction. No abnormal extra-axial fluid collections. Gray-white matter differentiation is normal. Basal cisterns are preserved.  No depressed skull fractures. Visualized paranasal sinuses and mastoid air cells are not opacified. There is a soft tissue injury of the right forehead superior to the right orbit containing punctate hyperdense foreign body in a 2 mm gas bubble. No underlying osseous injury seen.  CT of the cervical spine: There is a comminuted minimally displaced fracture of the left inferior articular facet of C5 and minimally displaced fracture of the left superior articular facet of C6. A linear calcific fragment is seen within the L5 left neural foramen, which likely represents a fracture fragment. Additionally, there is a fracture of the posterior process of C4, with left lateral displacement of the distal fracture fragment. There is also a nondisplaced fracture of the posterior process of C5. The may be a tiny avulsion fracture off of the posterior facet of C6. There is no evidence of alignment abnormality. No evidence of epidural hematoma or spinal canal narrowing.  There are multilevel degenerative changes  of the cervical spine with disc height loss, endplate sclerosis and posterior and anterior osteophytes. Ligamentous calcifications are also seen.  CT of the face: The globes and extraocular muscles appear symmetrical. No air fluid levels in the paranasal sinuses.  The frontal bones, orbital rims, maxillary antral walls, nasal bones, nasal septum, nasal spine, maxilla, pterygoid plates, zygomatic  arches, temporomandibular joints, and mandibles appear intact.  No displaced fractures are identified. Visualized thyroid cartilage and hyoid bone appear intact.  IMPRESSION: No CT evidence of intracranial traumatic injury.  Comminuted fracture of left C5 articular facet, and minimally displaced fracture of the left C6 articular facet, with possible minute fracture fragment within C5 left neural foramen.  Fractures of C4, C5 and likely C6 posterior processes.  No evidence of epidural hematoma, or spinal canal narrowing.  No evidence of facial fractures.  Given the severity of patient's symptoms, MRI of the cervical spine may be considered, when clinically feasible.  Key findings were discussed with Violeta Gelinas at the completion of the exam.   Electronically Signed   By: Ted Mcalpine M.D.   On: 11/09/2014 17:17   Ct Chest W Contrast  11/09/2014   CLINICAL DATA:  Fall.  Cannot move lower extremities.  EXAM: CT CHEST, ABDOMEN, AND PELVIS WITH CONTRAST  TECHNIQUE: Multidetector CT imaging of the chest, abdomen and pelvis was performed following the standard protocol during bolus administration of intravenous contrast.  CONTRAST:  OMNIPAQUE IOHEXOL 300 MG/ML  SOLN  COMPARISON:  None.  FINDINGS: CT CHEST FINDINGS  THORACIC INLET/BODY WALL:  No acute abnormality.  MEDIASTINUM:  Normal heart size. No pericardial effusion. No acute vascular abnormality. No adenopathy.  LUNG WINDOWS:  No contusion, hemothorax, or pneumothorax.  4 mm pulmonary nodule in the upper lingula on image 28. 2 mm subpleural nodule on the right on image 34.  OSSEOUS:  See below  CT ABDOMEN AND PELVIS FINDINGS  BODY WALL: Unremarkable.  Liver: No focal abnormality.  Biliary: No evidence of biliary obstruction or stone.  Pancreas: Unremarkable.  Spleen: Unremarkable.  Adrenals: Unremarkable.  Kidneys and ureters: No traumatic findings.  Bladder: Unremarkable.  Reproductive: Enlarged prostate, projecting into the bladder base.  Bowel: No  evidence of injury  Retroperitoneum: No mass or adenopathy.  Peritoneum: No free fluid or gas.  Vascular: No acute findings.  OSSEOUS: Nondisplaced posterior right ninth and tenth rib fractures.  Left C5 articular process fracture with bony fragment in the C5-6 foramen, as described on dedicated CT of the cervical spine. No cervical or thoracic spine fractures identified.  IMPRESSION: 1. Nondisplaced posterior right ninth and tenth rib fractures. 2. No evidence of intrathoracic or intra-abdominal injury. 3. 4 mm pulmonary nodule in the lingula. If the patient is at high risk for bronchogenic carcinoma, follow-up chest CT at 1 year is recommended. If the patient is at low risk, no follow-up is needed. This recommendation follows the consensus statement: Guidelines for Management of Small Pulmonary Nodules Detected on CT Scans: A Statement from the Fleischner Society as published in Radiology 2005; 237:395-400. 4. Enlarged prostate.   Electronically Signed   By: Marnee Spring M.D.   On: 11/09/2014 16:49   Ct Cervical Spine Wo Contrast  11/09/2014   CLINICAL DATA:  Patient is status post fall with neck pain and bilateral shoulder pain. Patient not able to move lower extremities, with tingling in hands and arms.  EXAM: CT HEAD WITHOUT CONTRAST  TECHNIQUE: Contiguous axial images were obtained from the vertex to the thoracic inlet  without intravenous contrast.  COMPARISON:  None.  FINDINGS: CT of the head: No mass effect or midline shift. No evidence of acute intracranial hemorrhage, or infarction. No abnormal extra-axial fluid collections. Gray-white matter differentiation is normal. Basal cisterns are preserved.  No depressed skull fractures. Visualized paranasal sinuses and mastoid air cells are not opacified. There is a soft tissue injury of the right forehead superior to the right orbit containing punctate hyperdense foreign body in a 2 mm gas bubble. No underlying osseous injury seen.  CT of the cervical spine:  There is a comminuted minimally displaced fracture of the left inferior articular facet of C5 and minimally displaced fracture of the left superior articular facet of C6. A linear calcific fragment is seen within the L5 left neural foramen, which likely represents a fracture fragment. Additionally, there is a fracture of the posterior process of C4, with left lateral displacement of the distal fracture fragment. There is also a nondisplaced fracture of the posterior process of C5. The may be a tiny avulsion fracture off of the posterior facet of C6. There is no evidence of alignment abnormality. No evidence of epidural hematoma or spinal canal narrowing.  There are multilevel degenerative changes of the cervical spine with disc height loss, endplate sclerosis and posterior and anterior osteophytes. Ligamentous calcifications are also seen.  CT of the face: The globes and extraocular muscles appear symmetrical. No air fluid levels in the paranasal sinuses.  The frontal bones, orbital rims, maxillary antral walls, nasal bones, nasal septum, nasal spine, maxilla, pterygoid plates, zygomatic arches, temporomandibular joints, and mandibles appear intact.  No displaced fractures are identified. Visualized thyroid cartilage and hyoid bone appear intact.  IMPRESSION: No CT evidence of intracranial traumatic injury.  Comminuted fracture of left C5 articular facet, and minimally displaced fracture of the left C6 articular facet, with possible minute fracture fragment within C5 left neural foramen.  Fractures of C4, C5 and likely C6 posterior processes.  No evidence of epidural hematoma, or spinal canal narrowing.  No evidence of facial fractures.  Given the severity of patient's symptoms, MRI of the cervical spine may be considered, when clinically feasible.  Key findings were discussed with Violeta Gelinas at the completion of the exam.   Electronically Signed   By: Ted Mcalpine M.D.   On: 11/09/2014 17:17   Ct  Abdomen Pelvis W Contrast  11/09/2014   CLINICAL DATA:  Fall.  Cannot move lower extremities.  EXAM: CT CHEST, ABDOMEN, AND PELVIS WITH CONTRAST  TECHNIQUE: Multidetector CT imaging of the chest, abdomen and pelvis was performed following the standard protocol during bolus administration of intravenous contrast.  CONTRAST:  OMNIPAQUE IOHEXOL 300 MG/ML  SOLN  COMPARISON:  None.  FINDINGS: CT CHEST FINDINGS  THORACIC INLET/BODY WALL:  No acute abnormality.  MEDIASTINUM:  Normal heart size. No pericardial effusion. No acute vascular abnormality. No adenopathy.  LUNG WINDOWS:  No contusion, hemothorax, or pneumothorax.  4 mm pulmonary nodule in the upper lingula on image 28. 2 mm subpleural nodule on the right on image 34.  OSSEOUS:  See below  CT ABDOMEN AND PELVIS FINDINGS  BODY WALL: Unremarkable.  Liver: No focal abnormality.  Biliary: No evidence of biliary obstruction or stone.  Pancreas: Unremarkable.  Spleen: Unremarkable.  Adrenals: Unremarkable.  Kidneys and ureters: No traumatic findings.  Bladder: Unremarkable.  Reproductive: Enlarged prostate, projecting into the bladder base.  Bowel: No evidence of injury  Retroperitoneum: No mass or adenopathy.  Peritoneum: No free fluid or gas.  Vascular: No acute findings.  OSSEOUS: Nondisplaced posterior right ninth and tenth rib fractures.  Left C5 articular process fracture with bony fragment in the C5-6 foramen, as described on dedicated CT of the cervical spine. No cervical or thoracic spine fractures identified.  IMPRESSION: 1. Nondisplaced posterior right ninth and tenth rib fractures. 2. No evidence of intrathoracic or intra-abdominal injury. 3. 4 mm pulmonary nodule in the lingula. If the patient is at high risk for bronchogenic carcinoma, follow-up chest CT at 1 year is recommended. If the patient is at low risk, no follow-up is needed. This recommendation follows the consensus statement: Guidelines for Management of Small Pulmonary Nodules Detected on  CT Scans: A Statement from the Fleischner Society as published in Radiology 2005; 237:395-400. 4. Enlarged prostate.   Electronically Signed   By: Marnee Spring M.D.   On: 11/09/2014 16:49   Dg Pelvis Portable  11/09/2014   CLINICAL DATA:  Patient fell from ladder  EXAM: PORTABLE PELVIS 1-2 VIEWS  COMPARISON:  None.  FINDINGS: There is no evidence of pelvic fracture or dislocation. Joint spaces appear intact. No erosive change.  IMPRESSION: No demonstrable fracture or dislocation. No appreciable arthropathy.   Electronically Signed   By: Bretta Bang III M.D.   On: 11/09/2014 15:55   Dg Chest Port 1 View  11/10/2014   CLINICAL DATA:  Subsequent encounter for multiple rib fractures  EXAM: PORTABLE CHEST - 1 VIEW  COMPARISON:  11/09/2014.  FINDINGS: 0615 hours. Lung volumes are low without edema, focal airspace consolidation, or pleural effusion. Cardiopericardial silhouette is at upper limits of normal for size. Telemetry leads overlie the chest. Prominent gastric bubble noted over the left upper quadrant of the abdomen. Posterior right-sided rib fracture seen on recent CT not evident on x-ray.  IMPRESSION: Low volume film without acute cardiopulmonary findings.   Electronically Signed   By: Kennith Center M.D.   On: 11/10/2014 08:00   Dg Chest Portable 1 View  11/09/2014   CLINICAL DATA:  Fall  EXAM: PORTABLE CHEST - 1 VIEW  COMPARISON:  None.  FINDINGS: Heart size at upper limits of normal. Lungs are grossly clear. Support apparatus artifact is present over the mid chest. No gross evidence for pneumothorax. No displaced fracture identified.  IMPRESSION: No gross evidence for acute cardiopulmonary process. Borderline cardiomegaly may in part be related to portable supine AP technique. Chest CT will be subsequently performed and dictated separately.   Electronically Signed   By: Christiana Pellant M.D.   On: 11/09/2014 15:56   Ct Maxillofacial Wo Cm  11/09/2014   CLINICAL DATA:  Patient is status post fall  with neck pain and bilateral shoulder pain. Patient not able to move lower extremities, with tingling in hands and arms.  EXAM: CT HEAD WITHOUT CONTRAST  TECHNIQUE: Contiguous axial images were obtained from the vertex to the thoracic inlet without intravenous contrast.  COMPARISON:  None.  FINDINGS: CT of the head: No mass effect or midline shift. No evidence of acute intracranial hemorrhage, or infarction. No abnormal extra-axial fluid collections. Gray-white matter differentiation is normal. Basal cisterns are preserved.  No depressed skull fractures. Visualized paranasal sinuses and mastoid air cells are not opacified. There is a soft tissue injury of the right forehead superior to the right orbit containing punctate hyperdense foreign body in a 2 mm gas bubble. No underlying osseous injury seen.  CT of the cervical spine: There is a comminuted minimally displaced fracture of the left inferior articular facet of C5  and minimally displaced fracture of the left superior articular facet of C6. A linear calcific fragment is seen within the L5 left neural foramen, which likely represents a fracture fragment. Additionally, there is a fracture of the posterior process of C4, with left lateral displacement of the distal fracture fragment. There is also a nondisplaced fracture of the posterior process of C5. The may be a tiny avulsion fracture off of the posterior facet of C6. There is no evidence of alignment abnormality. No evidence of epidural hematoma or spinal canal narrowing.  There are multilevel degenerative changes of the cervical spine with disc height loss, endplate sclerosis and posterior and anterior osteophytes. Ligamentous calcifications are also seen.  CT of the face: The globes and extraocular muscles appear symmetrical. No air fluid levels in the paranasal sinuses.  The frontal bones, orbital rims, maxillary antral walls, nasal bones, nasal septum, nasal spine, maxilla, pterygoid plates, zygomatic  arches, temporomandibular joints, and mandibles appear intact.  No displaced fractures are identified. Visualized thyroid cartilage and hyoid bone appear intact.  IMPRESSION: No CT evidence of intracranial traumatic injury.  Comminuted fracture of left C5 articular facet, and minimally displaced fracture of the left C6 articular facet, with possible minute fracture fragment within C5 left neural foramen.  Fractures of C4, C5 and likely C6 posterior processes.  No evidence of epidural hematoma, or spinal canal narrowing.  No evidence of facial fractures.  Given the severity of patient's symptoms, MRI of the cervical spine may be considered, when clinically feasible.  Key findings were discussed with Violeta Gelinas at the completion of the exam.   Electronically Signed   By: Ted Mcalpine M.D.   On: 11/09/2014 17:17    Assessment/Plan: Patient clinically stable.  OK to proceed with MRI of C-spine.    LOS: 1 day    Dorian Heckle, MD 11/10/2014, 8:47 AM

## 2014-11-10 NOTE — Progress Notes (Signed)
Patient's MRI demonstrated spinal cord contusion with intramedullary hematoma at C 56 level.  This is consistent with his injury.  He has less severe degenerative changes at C 45 and C 67 levels.  I have reviewed situation with patient.  I have recommended ACDF C 56 next week. I will plan to perform this surgery on 11/14/14.  Patient may be mobilized in collar and OK to initiate PT prior to surgery.

## 2014-11-10 NOTE — Progress Notes (Signed)
Trauma Service Note  Subjective: Patient doing okay.  Still having burning pain on his abdomen.  Tells me that he has a long history of asymptomatic bradycardia and runs a relatively low BP.  Has been worked up before by a cardiologist before including an outpatient monitor.  Objective: Vital signs in last 24 hours: Temp:  [98.2 F (36.8 C)-100.4 F (38 C)] 99.3 F (37.4 C) (08/05 0737) Pulse Rate:  [41-86] 50 (08/05 0845) Resp:  [9-27] 16 (08/05 0845) BP: (82-164)/(35-90) 120/46 mmHg (08/05 0830) SpO2:  [89 %-100 %] 98 % (08/05 0845) Arterial Line BP: (81-150)/(35-59) 81/41 mmHg (08/05 0845) Weight:  [88.451 kg (195 lb)] 88.451 kg (195 lb) (08/04 1550)    Intake/Output from previous day: 08/04 0701 - 08/05 0700 In: 5627.7 [P.O.:1815; I.V.:1812.7] Out: 2870 [Urine:2870] Intake/Output this shift: Total I/O In: 25.6 [I.V.:25.6] Out: -   General: No distress.    Lungs: Clear to auscultation.  No crepitance on the right side.  Minimal tenderness.  Abd: Superficial abdominal wall tenderness, Hypoactive bowel sounds.  Does not seem to have any peritonitis.  Not very distended.  Extremities: No changes.  Neuro: Right side in general stronger than left.  Uppers better than lowers.  Has sensation.  Lab Results: CBC   Recent Labs  11/09/14 1530 11/10/14 0600  WBC 7.3 15.9*  HGB 13.5 13.4  HCT 39.2 38.8*  PLT 175 199   BMET  Recent Labs  11/09/14 1530 11/10/14 0600  NA 139 139  K 4.2 4.3  CL 106 104  CO2 23 28  GLUCOSE 110* 151*  BUN 25* 18  CREATININE 1.65* 1.18  CALCIUM 8.8* 8.4*   PT/INR  Recent Labs  11/09/14 1530  LABPROT 14.0  INR 1.06   ABG No results for input(s): PHART, HCO3 in the last 72 hours.  Invalid input(s): PCO2, PO2  Studies/Results: Dg Elbow Complete Right  11/09/2014   CLINICAL DATA:  Fall with posterior elbow laceration and pain.  EXAM: RIGHT ELBOW - COMPLETE 3+ VIEW  COMPARISON:  None.  FINDINGS: Soft tissue swelling around the  olecranon with faint foreign body measuring 2 mm. There is also a metallic appearing foreign body at the olecranon medially measuring 3 mm. No evidence of fracture, dislocation, or joint effusion.  Note that positioning is nonstandard due to patient condition, and this best obtainable exam is decreasing sensitivity.  IMPRESSION: 1. Two foreign bodies about the olecranon, up to 3 mm. 2. No evidence of fracture.   Electronically Signed   By: Marnee Spring M.D.   On: 11/09/2014 18:26   Ct Head Wo Contrast  11/09/2014   CLINICAL DATA:  Patient is status post fall with neck pain and bilateral shoulder pain. Patient not able to move lower extremities, with tingling in hands and arms.  EXAM: CT HEAD WITHOUT CONTRAST  TECHNIQUE: Contiguous axial images were obtained from the vertex to the thoracic inlet without intravenous contrast.  COMPARISON:  None.  FINDINGS: CT of the head: No mass effect or midline shift. No evidence of acute intracranial hemorrhage, or infarction. No abnormal extra-axial fluid collections. Gray-white matter differentiation is normal. Basal cisterns are preserved.  No depressed skull fractures. Visualized paranasal sinuses and mastoid air cells are not opacified. There is a soft tissue injury of the right forehead superior to the right orbit containing punctate hyperdense foreign body in a 2 mm gas bubble. No underlying osseous injury seen.  CT of the cervical spine: There is a comminuted minimally displaced fracture of  the left inferior articular facet of C5 and minimally displaced fracture of the left superior articular facet of C6. A linear calcific fragment is seen within the L5 left neural foramen, which likely represents a fracture fragment. Additionally, there is a fracture of the posterior process of C4, with left lateral displacement of the distal fracture fragment. There is also a nondisplaced fracture of the posterior process of C5. The may be a tiny avulsion fracture off of the  posterior facet of C6. There is no evidence of alignment abnormality. No evidence of epidural hematoma or spinal canal narrowing.  There are multilevel degenerative changes of the cervical spine with disc height loss, endplate sclerosis and posterior and anterior osteophytes. Ligamentous calcifications are also seen.  CT of the face: The globes and extraocular muscles appear symmetrical. No air fluid levels in the paranasal sinuses.  The frontal bones, orbital rims, maxillary antral walls, nasal bones, nasal septum, nasal spine, maxilla, pterygoid plates, zygomatic arches, temporomandibular joints, and mandibles appear intact.  No displaced fractures are identified. Visualized thyroid cartilage and hyoid bone appear intact.  IMPRESSION: No CT evidence of intracranial traumatic injury.  Comminuted fracture of left C5 articular facet, and minimally displaced fracture of the left C6 articular facet, with possible minute fracture fragment within C5 left neural foramen.  Fractures of C4, C5 and likely C6 posterior processes.  No evidence of epidural hematoma, or spinal canal narrowing.  No evidence of facial fractures.  Given the severity of patient's symptoms, MRI of the cervical spine may be considered, when clinically feasible.  Key findings were discussed with Violeta Gelinas at the completion of the exam.   Electronically Signed   By: Ted Mcalpine M.D.   On: 11/09/2014 17:17   Ct Chest W Contrast  11/09/2014   CLINICAL DATA:  Fall.  Cannot move lower extremities.  EXAM: CT CHEST, ABDOMEN, AND PELVIS WITH CONTRAST  TECHNIQUE: Multidetector CT imaging of the chest, abdomen and pelvis was performed following the standard protocol during bolus administration of intravenous contrast.  CONTRAST:  OMNIPAQUE IOHEXOL 300 MG/ML  SOLN  COMPARISON:  None.  FINDINGS: CT CHEST FINDINGS  THORACIC INLET/BODY WALL:  No acute abnormality.  MEDIASTINUM:  Normal heart size. No pericardial effusion. No acute vascular  abnormality. No adenopathy.  LUNG WINDOWS:  No contusion, hemothorax, or pneumothorax.  4 mm pulmonary nodule in the upper lingula on image 28. 2 mm subpleural nodule on the right on image 34.  OSSEOUS:  See below  CT ABDOMEN AND PELVIS FINDINGS  BODY WALL: Unremarkable.  Liver: No focal abnormality.  Biliary: No evidence of biliary obstruction or stone.  Pancreas: Unremarkable.  Spleen: Unremarkable.  Adrenals: Unremarkable.  Kidneys and ureters: No traumatic findings.  Bladder: Unremarkable.  Reproductive: Enlarged prostate, projecting into the bladder base.  Bowel: No evidence of injury  Retroperitoneum: No mass or adenopathy.  Peritoneum: No free fluid or gas.  Vascular: No acute findings.  OSSEOUS: Nondisplaced posterior right ninth and tenth rib fractures.  Left C5 articular process fracture with bony fragment in the C5-6 foramen, as described on dedicated CT of the cervical spine. No cervical or thoracic spine fractures identified.  IMPRESSION: 1. Nondisplaced posterior right ninth and tenth rib fractures. 2. No evidence of intrathoracic or intra-abdominal injury. 3. 4 mm pulmonary nodule in the lingula. If the patient is at high risk for bronchogenic carcinoma, follow-up chest CT at 1 year is recommended. If the patient is at low risk, no follow-up is needed. This recommendation  follows the consensus statement: Guidelines for Management of Small Pulmonary Nodules Detected on CT Scans: A Statement from the Fleischner Society as published in Radiology 2005; 237:395-400. 4. Enlarged prostate.   Electronically Signed   By: Marnee Spring M.D.   On: 11/09/2014 16:49   Ct Cervical Spine Wo Contrast  11/09/2014   CLINICAL DATA:  Patient is status post fall with neck pain and bilateral shoulder pain. Patient not able to move lower extremities, with tingling in hands and arms.  EXAM: CT HEAD WITHOUT CONTRAST  TECHNIQUE: Contiguous axial images were obtained from the vertex to the thoracic inlet without intravenous  contrast.  COMPARISON:  None.  FINDINGS: CT of the head: No mass effect or midline shift. No evidence of acute intracranial hemorrhage, or infarction. No abnormal extra-axial fluid collections. Gray-white matter differentiation is normal. Basal cisterns are preserved.  No depressed skull fractures. Visualized paranasal sinuses and mastoid air cells are not opacified. There is a soft tissue injury of the right forehead superior to the right orbit containing punctate hyperdense foreign body in a 2 mm gas bubble. No underlying osseous injury seen.  CT of the cervical spine: There is a comminuted minimally displaced fracture of the left inferior articular facet of C5 and minimally displaced fracture of the left superior articular facet of C6. A linear calcific fragment is seen within the L5 left neural foramen, which likely represents a fracture fragment. Additionally, there is a fracture of the posterior process of C4, with left lateral displacement of the distal fracture fragment. There is also a nondisplaced fracture of the posterior process of C5. The may be a tiny avulsion fracture off of the posterior facet of C6. There is no evidence of alignment abnormality. No evidence of epidural hematoma or spinal canal narrowing.  There are multilevel degenerative changes of the cervical spine with disc height loss, endplate sclerosis and posterior and anterior osteophytes. Ligamentous calcifications are also seen.  CT of the face: The globes and extraocular muscles appear symmetrical. No air fluid levels in the paranasal sinuses.  The frontal bones, orbital rims, maxillary antral walls, nasal bones, nasal septum, nasal spine, maxilla, pterygoid plates, zygomatic arches, temporomandibular joints, and mandibles appear intact.  No displaced fractures are identified. Visualized thyroid cartilage and hyoid bone appear intact.  IMPRESSION: No CT evidence of intracranial traumatic injury.  Comminuted fracture of left C5 articular  facet, and minimally displaced fracture of the left C6 articular facet, with possible minute fracture fragment within C5 left neural foramen.  Fractures of C4, C5 and likely C6 posterior processes.  No evidence of epidural hematoma, or spinal canal narrowing.  No evidence of facial fractures.  Given the severity of patient's symptoms, MRI of the cervical spine may be considered, when clinically feasible.  Key findings were discussed with Violeta Gelinas at the completion of the exam.   Electronically Signed   By: Ted Mcalpine M.D.   On: 11/09/2014 17:17   Ct Abdomen Pelvis W Contrast  11/09/2014   CLINICAL DATA:  Fall.  Cannot move lower extremities.  EXAM: CT CHEST, ABDOMEN, AND PELVIS WITH CONTRAST  TECHNIQUE: Multidetector CT imaging of the chest, abdomen and pelvis was performed following the standard protocol during bolus administration of intravenous contrast.  CONTRAST:  OMNIPAQUE IOHEXOL 300 MG/ML  SOLN  COMPARISON:  None.  FINDINGS: CT CHEST FINDINGS  THORACIC INLET/BODY WALL:  No acute abnormality.  MEDIASTINUM:  Normal heart size. No pericardial effusion. No acute vascular abnormality. No adenopathy.  LUNG  WINDOWS:  No contusion, hemothorax, or pneumothorax.  4 mm pulmonary nodule in the upper lingula on image 28. 2 mm subpleural nodule on the right on image 34.  OSSEOUS:  See below  CT ABDOMEN AND PELVIS FINDINGS  BODY WALL: Unremarkable.  Liver: No focal abnormality.  Biliary: No evidence of biliary obstruction or stone.  Pancreas: Unremarkable.  Spleen: Unremarkable.  Adrenals: Unremarkable.  Kidneys and ureters: No traumatic findings.  Bladder: Unremarkable.  Reproductive: Enlarged prostate, projecting into the bladder base.  Bowel: No evidence of injury  Retroperitoneum: No mass or adenopathy.  Peritoneum: No free fluid or gas.  Vascular: No acute findings.  OSSEOUS: Nondisplaced posterior right ninth and tenth rib fractures.  Left C5 articular process fracture with bony fragment in the  C5-6 foramen, as described on dedicated CT of the cervical spine. No cervical or thoracic spine fractures identified.  IMPRESSION: 1. Nondisplaced posterior right ninth and tenth rib fractures. 2. No evidence of intrathoracic or intra-abdominal injury. 3. 4 mm pulmonary nodule in the lingula. If the patient is at high risk for bronchogenic carcinoma, follow-up chest CT at 1 year is recommended. If the patient is at low risk, no follow-up is needed. This recommendation follows the consensus statement: Guidelines for Management of Small Pulmonary Nodules Detected on CT Scans: A Statement from the Fleischner Society as published in Radiology 2005; 237:395-400. 4. Enlarged prostate.   Electronically Signed   By: Marnee Spring M.D.   On: 11/09/2014 16:49   Dg Pelvis Portable  11/09/2014   CLINICAL DATA:  Patient fell from ladder  EXAM: PORTABLE PELVIS 1-2 VIEWS  COMPARISON:  None.  FINDINGS: There is no evidence of pelvic fracture or dislocation. Joint spaces appear intact. No erosive change.  IMPRESSION: No demonstrable fracture or dislocation. No appreciable arthropathy.   Electronically Signed   By: Bretta Bang III M.D.   On: 11/09/2014 15:55   Dg Chest Port 1 View  11/10/2014   CLINICAL DATA:  Subsequent encounter for multiple rib fractures  EXAM: PORTABLE CHEST - 1 VIEW  COMPARISON:  11/09/2014.  FINDINGS: 0615 hours. Lung volumes are low without edema, focal airspace consolidation, or pleural effusion. Cardiopericardial silhouette is at upper limits of normal for size. Telemetry leads overlie the chest. Prominent gastric bubble noted over the left upper quadrant of the abdomen. Posterior right-sided rib fracture seen on recent CT not evident on x-ray.  IMPRESSION: Low volume film without acute cardiopulmonary findings.   Electronically Signed   By: Kennith Center M.D.   On: 11/10/2014 08:00   Dg Chest Portable 1 View  11/09/2014   CLINICAL DATA:  Fall  EXAM: PORTABLE CHEST - 1 VIEW  COMPARISON:  None.   FINDINGS: Heart size at upper limits of normal. Lungs are grossly clear. Support apparatus artifact is present over the mid chest. No gross evidence for pneumothorax. No displaced fracture identified.  IMPRESSION: No gross evidence for acute cardiopulmonary process. Borderline cardiomegaly may in part be related to portable supine AP technique. Chest CT will be subsequently performed and dictated separately.   Electronically Signed   By: Christiana Pellant M.D.   On: 11/09/2014 15:56   Ct Maxillofacial Wo Cm  11/09/2014   CLINICAL DATA:  Patient is status post fall with neck pain and bilateral shoulder pain. Patient not able to move lower extremities, with tingling in hands and arms.  EXAM: CT HEAD WITHOUT CONTRAST  TECHNIQUE: Contiguous axial images were obtained from the vertex to the thoracic inlet without intravenous  contrast.  COMPARISON:  None.  FINDINGS: CT of the head: No mass effect or midline shift. No evidence of acute intracranial hemorrhage, or infarction. No abnormal extra-axial fluid collections. Gray-white matter differentiation is normal. Basal cisterns are preserved.  No depressed skull fractures. Visualized paranasal sinuses and mastoid air cells are not opacified. There is a soft tissue injury of the right forehead superior to the right orbit containing punctate hyperdense foreign body in a 2 mm gas bubble. No underlying osseous injury seen.  CT of the cervical spine: There is a comminuted minimally displaced fracture of the left inferior articular facet of C5 and minimally displaced fracture of the left superior articular facet of C6. A linear calcific fragment is seen within the L5 left neural foramen, which likely represents a fracture fragment. Additionally, there is a fracture of the posterior process of C4, with left lateral displacement of the distal fracture fragment. There is also a nondisplaced fracture of the posterior process of C5. The may be a tiny avulsion fracture off of the  posterior facet of C6. There is no evidence of alignment abnormality. No evidence of epidural hematoma or spinal canal narrowing.  There are multilevel degenerative changes of the cervical spine with disc height loss, endplate sclerosis and posterior and anterior osteophytes. Ligamentous calcifications are also seen.  CT of the face: The globes and extraocular muscles appear symmetrical. No air fluid levels in the paranasal sinuses.  The frontal bones, orbital rims, maxillary antral walls, nasal bones, nasal septum, nasal spine, maxilla, pterygoid plates, zygomatic arches, temporomandibular joints, and mandibles appear intact.  No displaced fractures are identified. Visualized thyroid cartilage and hyoid bone appear intact.  IMPRESSION: No CT evidence of intracranial traumatic injury.  Comminuted fracture of left C5 articular facet, and minimally displaced fracture of the left C6 articular facet, with possible minute fracture fragment within C5 left neural foramen.  Fractures of C4, C5 and likely C6 posterior processes.  No evidence of epidural hematoma, or spinal canal narrowing.  No evidence of facial fractures.  Given the severity of patient's symptoms, MRI of the cervical spine may be considered, when clinically feasible.  Key findings were discussed with Violeta Gelinas at the completion of the exam.   Electronically Signed   By: Ted Mcalpine M.D.   On: 11/09/2014 17:17    Anti-infectives: Anti-infectives    None      Assessment/Plan: s/p  MRI scan of cervical spine.  Wean off dopamine as tolerated, but will relax parameters for weaning.  LOS: 1 day   [name]. Gae Bon, MD, FACS [phone] Trauma Surgeon 11/10/2014

## 2014-11-10 NOTE — Progress Notes (Signed)
Peripherally Inserted Central Catheter/Midline Placement  The IV Nurse has discussed with the patient and/or persons authorized to consent for the patient, the purpose of this procedure and the potential benefits and risks involved with this procedure.  The benefits include less needle sticks, lab draws from the catheter and patient may be discharged home with the catheter.  Risks include, but not limited to, infection, bleeding, blood clot (thrombus formation), and puncture of an artery; nerve damage and irregular heat beat.  Alternatives to this procedure were also discussed.  PICC/Midline Placement Documentation        Timmothy Sours 11/10/2014, 2:32 PM

## 2014-11-11 LAB — CBC WITH DIFFERENTIAL/PLATELET
BASOS ABS: 0 10*3/uL (ref 0.0–0.1)
Basophils Relative: 0 % (ref 0–1)
EOS ABS: 0 10*3/uL (ref 0.0–0.7)
EOS PCT: 0 % (ref 0–5)
HCT: 33.7 % — ABNORMAL LOW (ref 39.0–52.0)
Hemoglobin: 11.3 g/dL — ABNORMAL LOW (ref 13.0–17.0)
LYMPHS ABS: 1.1 10*3/uL (ref 0.7–4.0)
LYMPHS PCT: 8 % — AB (ref 12–46)
MCH: 30.8 pg (ref 26.0–34.0)
MCHC: 33.5 g/dL (ref 30.0–36.0)
MCV: 91.8 fL (ref 78.0–100.0)
Monocytes Absolute: 1 10*3/uL (ref 0.1–1.0)
Monocytes Relative: 7 % (ref 3–12)
NEUTROS ABS: 11.8 10*3/uL — AB (ref 1.7–7.7)
Neutrophils Relative %: 85 % — ABNORMAL HIGH (ref 43–77)
PLATELETS: 149 10*3/uL — AB (ref 150–400)
RBC: 3.67 MIL/uL — AB (ref 4.22–5.81)
RDW: 13.2 % (ref 11.5–15.5)
WBC: 13.8 10*3/uL — ABNORMAL HIGH (ref 4.0–10.5)

## 2014-11-11 LAB — BASIC METABOLIC PANEL
Anion gap: 4 — ABNORMAL LOW (ref 5–15)
BUN: 23 mg/dL — AB (ref 6–20)
CO2: 27 mmol/L (ref 22–32)
CREATININE: 1.25 mg/dL — AB (ref 0.61–1.24)
Calcium: 7.8 mg/dL — ABNORMAL LOW (ref 8.9–10.3)
Chloride: 105 mmol/L (ref 101–111)
GFR, EST NON AFRICAN AMERICAN: 59 mL/min — AB (ref >60)
Glucose, Bld: 139 mg/dL — ABNORMAL HIGH (ref 65–99)
Potassium: 5 mmol/L (ref 3.5–5.1)
Sodium: 136 mmol/L (ref 135–145)

## 2014-11-11 MED ORDER — SODIUM CHLORIDE 0.9 % IV BOLUS (SEPSIS)
1000.0000 mL | Freq: Once | INTRAVENOUS | Status: AC
  Administered 2014-11-11: 1000 mL via INTRAVENOUS

## 2014-11-11 MED ORDER — ACETAMINOPHEN 160 MG/5ML PO SOLN
650.0000 mg | Freq: Four times a day (QID) | ORAL | Status: DC | PRN
  Administered 2014-11-11 – 2014-11-16 (×2): 650 mg via ORAL
  Filled 2014-11-11 (×3): qty 20.3

## 2014-11-11 NOTE — Progress Notes (Signed)
Pt's temp 102.39F. MD notified. Tylenol ordered. Will cont to monitor.

## 2014-11-11 NOTE — Progress Notes (Signed)
Trauma Service Note  Subjective: Doing ok. No problems overnight.  Objective: Vital signs in last 24 hours: Temp:  [98.1 F (36.7 C)-100.7 F (38.2 C)] 100.7 F (38.2 C) (08/06 0800) Pulse Rate:  [38-63] 48 (08/06 1030) Resp:  [0-26] 13 (08/06 1030) BP: (82-118)/(37-63) 97/50 mmHg (08/06 1030) SpO2:  [94 %-100 %] 97 % (08/06 1030) Arterial Line BP: (65-151)/(41-67) 109/63 mmHg (08/06 1030)    Intake/Output from previous day: 08/05 0701 - 08/06 0700 In: 3008 [I.V.:3008] Out: 760 [Urine:760] Intake/Output this shift: Total I/O In: 539.7 [I.V.:539.7] Out: 250 [Urine:250]  General: NAD  Lungs: CTAB  Abd: soft, tender to palpation throughout, no rigidity or guarding  Extremities: no changes  Neuro: GCS 15, AO x 3, R stronger than left, right hand unable to extend fingers, LLE 2/5, RLE 4/5  Lab Results: CBC   Recent Labs  11/10/14 0600 11/11/14 0500  WBC 15.9* 13.8*  HGB 13.4 11.3*  HCT 38.8* 33.7*  PLT 199 149*   BMET  Recent Labs  11/10/14 0600 11/11/14 0500  NA 139 136  K 4.3 5.0  CL 104 105  CO2 28 27  GLUCOSE 151* 139*  BUN 18 23*  CREATININE 1.18 1.25*  CALCIUM 8.4* 7.8*   PT/INR  Recent Labs  11/09/14 1530  LABPROT 14.0  INR 1.06   ABG No results for input(s): PHART, HCO3 in the last 72 hours.  Invalid input(s): PCO2, PO2  Studies/Results: Dg Elbow Complete Right  11/09/2014   CLINICAL DATA:  Fall with posterior elbow laceration and pain.  EXAM: RIGHT ELBOW - COMPLETE 3+ VIEW  COMPARISON:  None.  FINDINGS: Soft tissue swelling around the olecranon with faint foreign body measuring 2 mm. There is also a metallic appearing foreign body at the olecranon medially measuring 3 mm. No evidence of fracture, dislocation, or joint effusion.  Note that positioning is nonstandard due to patient condition, and this best obtainable exam is decreasing sensitivity.  IMPRESSION: 1. Two foreign bodies about the olecranon, up to 3 mm. 2. No evidence of  fracture.   Electronically Signed   By: Marnee Spring M.D.   On: 11/09/2014 18:26   Ct Head Wo Contrast  11/09/2014   CLINICAL DATA:  Patient is status post fall with neck pain and bilateral shoulder pain. Patient not able to move lower extremities, with tingling in hands and arms.  EXAM: CT HEAD WITHOUT CONTRAST  TECHNIQUE: Contiguous axial images were obtained from the vertex to the thoracic inlet without intravenous contrast.  COMPARISON:  None.  FINDINGS: CT of the head: No mass effect or midline shift. No evidence of acute intracranial hemorrhage, or infarction. No abnormal extra-axial fluid collections. Gray-white matter differentiation is normal. Basal cisterns are preserved.  No depressed skull fractures. Visualized paranasal sinuses and mastoid air cells are not opacified. There is a soft tissue injury of the right forehead superior to the right orbit containing punctate hyperdense foreign body in a 2 mm gas bubble. No underlying osseous injury seen.  CT of the cervical spine: There is a comminuted minimally displaced fracture of the left inferior articular facet of C5 and minimally displaced fracture of the left superior articular facet of C6. A linear calcific fragment is seen within the L5 left neural foramen, which likely represents a fracture fragment. Additionally, there is a fracture of the posterior process of C4, with left lateral displacement of the distal fracture fragment. There is also a nondisplaced fracture of the posterior process of C5. The may be  a tiny avulsion fracture off of the posterior facet of C6. There is no evidence of alignment abnormality. No evidence of epidural hematoma or spinal canal narrowing.  There are multilevel degenerative changes of the cervical spine with disc height loss, endplate sclerosis and posterior and anterior osteophytes. Ligamentous calcifications are also seen.  CT of the face: The globes and extraocular muscles appear symmetrical. No air fluid levels  in the paranasal sinuses.  The frontal bones, orbital rims, maxillary antral walls, nasal bones, nasal septum, nasal spine, maxilla, pterygoid plates, zygomatic arches, temporomandibular joints, and mandibles appear intact.  No displaced fractures are identified. Visualized thyroid cartilage and hyoid bone appear intact.  IMPRESSION: No CT evidence of intracranial traumatic injury.  Comminuted fracture of left C5 articular facet, and minimally displaced fracture of the left C6 articular facet, with possible minute fracture fragment within C5 left neural foramen.  Fractures of C4, C5 and likely C6 posterior processes.  No evidence of epidural hematoma, or spinal canal narrowing.  No evidence of facial fractures.  Given the severity of patient's symptoms, MRI of the cervical spine may be considered, when clinically feasible.  Key findings were discussed with Violeta Gelinas at the completion of the exam.   Electronically Signed   By: Ted Mcalpine M.D.   On: 11/09/2014 17:17   Ct Chest W Contrast  11/09/2014   CLINICAL DATA:  Fall.  Cannot move lower extremities.  EXAM: CT CHEST, ABDOMEN, AND PELVIS WITH CONTRAST  TECHNIQUE: Multidetector CT imaging of the chest, abdomen and pelvis was performed following the standard protocol during bolus administration of intravenous contrast.  CONTRAST:  OMNIPAQUE IOHEXOL 300 MG/ML  SOLN  COMPARISON:  None.  FINDINGS: CT CHEST FINDINGS  THORACIC INLET/BODY WALL:  No acute abnormality.  MEDIASTINUM:  Normal heart size. No pericardial effusion. No acute vascular abnormality. No adenopathy.  LUNG WINDOWS:  No contusion, hemothorax, or pneumothorax.  4 mm pulmonary nodule in the upper lingula on image 28. 2 mm subpleural nodule on the right on image 34.  OSSEOUS:  See below  CT ABDOMEN AND PELVIS FINDINGS  BODY WALL: Unremarkable.  Liver: No focal abnormality.  Biliary: No evidence of biliary obstruction or stone.  Pancreas: Unremarkable.  Spleen: Unremarkable.  Adrenals:  Unremarkable.  Kidneys and ureters: No traumatic findings.  Bladder: Unremarkable.  Reproductive: Enlarged prostate, projecting into the bladder base.  Bowel: No evidence of injury  Retroperitoneum: No mass or adenopathy.  Peritoneum: No free fluid or gas.  Vascular: No acute findings.  OSSEOUS: Nondisplaced posterior right ninth and tenth rib fractures.  Left C5 articular process fracture with bony fragment in the C5-6 foramen, as described on dedicated CT of the cervical spine. No cervical or thoracic spine fractures identified.  IMPRESSION: 1. Nondisplaced posterior right ninth and tenth rib fractures. 2. No evidence of intrathoracic or intra-abdominal injury. 3. 4 mm pulmonary nodule in the lingula. If the patient is at high risk for bronchogenic carcinoma, follow-up chest CT at 1 year is recommended. If the patient is at low risk, no follow-up is needed. This recommendation follows the consensus statement: Guidelines for Management of Small Pulmonary Nodules Detected on CT Scans: A Statement from the Fleischner Society as published in Radiology 2005; 237:395-400. 4. Enlarged prostate.   Electronically Signed   By: Marnee Spring M.D.   On: 11/09/2014 16:49   Ct Cervical Spine Wo Contrast  11/09/2014   CLINICAL DATA:  Patient is status post fall with neck pain and bilateral shoulder pain.  Patient not able to move lower extremities, with tingling in hands and arms.  EXAM: CT HEAD WITHOUT CONTRAST  TECHNIQUE: Contiguous axial images were obtained from the vertex to the thoracic inlet without intravenous contrast.  COMPARISON:  None.  FINDINGS: CT of the head: No mass effect or midline shift. No evidence of acute intracranial hemorrhage, or infarction. No abnormal extra-axial fluid collections. Gray-white matter differentiation is normal. Basal cisterns are preserved.  No depressed skull fractures. Visualized paranasal sinuses and mastoid air cells are not opacified. There is a soft tissue injury of the right  forehead superior to the right orbit containing punctate hyperdense foreign body in a 2 mm gas bubble. No underlying osseous injury seen.  CT of the cervical spine: There is a comminuted minimally displaced fracture of the left inferior articular facet of C5 and minimally displaced fracture of the left superior articular facet of C6. A linear calcific fragment is seen within the L5 left neural foramen, which likely represents a fracture fragment. Additionally, there is a fracture of the posterior process of C4, with left lateral displacement of the distal fracture fragment. There is also a nondisplaced fracture of the posterior process of C5. The may be a tiny avulsion fracture off of the posterior facet of C6. There is no evidence of alignment abnormality. No evidence of epidural hematoma or spinal canal narrowing.  There are multilevel degenerative changes of the cervical spine with disc height loss, endplate sclerosis and posterior and anterior osteophytes. Ligamentous calcifications are also seen.  CT of the face: The globes and extraocular muscles appear symmetrical. No air fluid levels in the paranasal sinuses.  The frontal bones, orbital rims, maxillary antral walls, nasal bones, nasal septum, nasal spine, maxilla, pterygoid plates, zygomatic arches, temporomandibular joints, and mandibles appear intact.  No displaced fractures are identified. Visualized thyroid cartilage and hyoid bone appear intact.  IMPRESSION: No CT evidence of intracranial traumatic injury.  Comminuted fracture of left C5 articular facet, and minimally displaced fracture of the left C6 articular facet, with possible minute fracture fragment within C5 left neural foramen.  Fractures of C4, C5 and likely C6 posterior processes.  No evidence of epidural hematoma, or spinal canal narrowing.  No evidence of facial fractures.  Given the severity of patient's symptoms, MRI of the cervical spine may be considered, when clinically feasible.  Key  findings were discussed with Violeta Gelinas at the completion of the exam.   Electronically Signed   By: Ted Mcalpine M.D.   On: 11/09/2014 17:17   Mr Cervical Spine Wo Contrast  11/10/2014   CLINICAL DATA:  Fall from roof. Tingling in the arms and fingers. Patient unable to move legs. Follow-up abnormal CT.  EXAM: MRI CERVICAL SPINE WITHOUT CONTRAST  TECHNIQUE: Multiplanar, multisequence MR imaging of the cervical spine was performed. No intravenous contrast was administered.  COMPARISON:  Cervical spine CT 11/09/2014.  FINDINGS: Alignment: Mildly exaggerated cervical lordosis. No spondylolisthesis.  Vertebrae: Bone marrow edema around the fractures at C5 and C6 is poorly visible on the inversion recovery images, partially due to technical factors. No aggressive osseous lesions.  Cord: The upper cervical cord appears normal. Abnormal cord signal is present from C5 through C6, best seen on inversion recovery images. On gradient echo images, there is an intramedullary hematoma dorsal the C6 measuring about 8 mm. This is more difficult to appreciate on the axial image sets. Cervical cord is colon compatible with cord contusion.  Posterior Fossa: Normal.  Vertebral Arteries: Flow voids present  in both vertebral arteries.  Paraspinal tissues: There is both prevertebral edema and posterior paraspinal edema associated with cervical spine fractures. Extensive cervical strain is present with posterior paraspinal muscular edema. No large paraspinal muscular hematoma is identified. Although these images are mildly technically degraded, on the inversion recovery images, there appears to be discontinuity of the anterior longitudinal ligament at C5-C6 consistent with a tear. There is also probably posterior longitudinal ligament disruption given the cord injury.  Disc Signal: Diffuse disc desiccation.  Disc levels:  C2-C3:  Negative.  C3-C4: Bilateral foraminal stenosis associated with uncovertebral spurring. Central  canal patent.  C4-C5: Broad-based disc osteophyte complex and posterior ligamentum flavum redundancy produce mild central stenosis. There is bilateral uncovertebral spurring producing bilateral foraminal stenosis.  C5-C6: Cord swelling is present at this level. Superimposed degenerative disease is present with broad-based disc osteophyte complex. In conjunction with the cord swelling, there is almost complete effacement of the subarachnoid space at C5-C6 disc level. There is LEFT-greater-than-RIGHT bilateral foraminal stenosis secondary to uncovertebral spurring and the LEFT-sided C6 superior articular process fracture probably contributes to RIGHT foraminal stenosis.  C6-C7: Severe disc degeneration with shallow disc osteophyte complex producing mild central stenosis. Mild bilateral foraminal stenosis secondary to uncovertebral spurring.  C7-T1:  Bilateral facet arthrosis without stenosis.  IMPRESSION: 1. Cervical cord contusion extending dorsal to C5 and C6 vertebrae. intramedullary hematoma measuring 8 mm dorsal to the C6 vertebra. Combination of cord swelling and degenerative disease effaces the subarachnoid space at the level of the cord contusion/hematoma. 2. Moderate multilevel cervical spondylosis detailed above. 3. LEFT eccentric cervical strain associated with previously demonstrated cervical spine fracture. 4. Critical Value/emergent results were called by telephone at the time of interpretation on 11/10/2014 at 1:56 pm to Dr. Jimmye Norman , who verbally acknowledged these results.   Electronically Signed   By: Andreas Newport M.D.   On: 11/10/2014 13:56   Ct Abdomen Pelvis W Contrast  11/09/2014   CLINICAL DATA:  Fall.  Cannot move lower extremities.  EXAM: CT CHEST, ABDOMEN, AND PELVIS WITH CONTRAST  TECHNIQUE: Multidetector CT imaging of the chest, abdomen and pelvis was performed following the standard protocol during bolus administration of intravenous contrast.  CONTRAST:  OMNIPAQUE IOHEXOL 300  MG/ML  SOLN  COMPARISON:  None.  FINDINGS: CT CHEST FINDINGS  THORACIC INLET/BODY WALL:  No acute abnormality.  MEDIASTINUM:  Normal heart size. No pericardial effusion. No acute vascular abnormality. No adenopathy.  LUNG WINDOWS:  No contusion, hemothorax, or pneumothorax.  4 mm pulmonary nodule in the upper lingula on image 28. 2 mm subpleural nodule on the right on image 34.  OSSEOUS:  See below  CT ABDOMEN AND PELVIS FINDINGS  BODY WALL: Unremarkable.  Liver: No focal abnormality.  Biliary: No evidence of biliary obstruction or stone.  Pancreas: Unremarkable.  Spleen: Unremarkable.  Adrenals: Unremarkable.  Kidneys and ureters: No traumatic findings.  Bladder: Unremarkable.  Reproductive: Enlarged prostate, projecting into the bladder base.  Bowel: No evidence of injury  Retroperitoneum: No mass or adenopathy.  Peritoneum: No free fluid or gas.  Vascular: No acute findings.  OSSEOUS: Nondisplaced posterior right ninth and tenth rib fractures.  Left C5 articular process fracture with bony fragment in the C5-6 foramen, as described on dedicated CT of the cervical spine. No cervical or thoracic spine fractures identified.  IMPRESSION: 1. Nondisplaced posterior right ninth and tenth rib fractures. 2. No evidence of intrathoracic or intra-abdominal injury. 3. 4 mm pulmonary nodule in the lingula. If the patient  is at high risk for bronchogenic carcinoma, follow-up chest CT at 1 year is recommended. If the patient is at low risk, no follow-up is needed. This recommendation follows the consensus statement: Guidelines for Management of Small Pulmonary Nodules Detected on CT Scans: A Statement from the Fleischner Society as published in Radiology 2005; 237:395-400. 4. Enlarged prostate.   Electronically Signed   By: Marnee Spring M.D.   On: 11/09/2014 16:49   Dg Pelvis Portable  11/09/2014   CLINICAL DATA:  Patient fell from ladder  EXAM: PORTABLE PELVIS 1-2 VIEWS  COMPARISON:  None.  FINDINGS: There is no evidence  of pelvic fracture or dislocation. Joint spaces appear intact. No erosive change.  IMPRESSION: No demonstrable fracture or dislocation. No appreciable arthropathy.   Electronically Signed   By: Bretta Bang III M.D.   On: 11/09/2014 15:55   Dg Chest Port 1 View  11/10/2014   CLINICAL DATA:  Subsequent encounter for multiple rib fractures  EXAM: PORTABLE CHEST - 1 VIEW  COMPARISON:  11/09/2014.  FINDINGS: 0615 hours. Lung volumes are low without edema, focal airspace consolidation, or pleural effusion. Cardiopericardial silhouette is at upper limits of normal for size. Telemetry leads overlie the chest. Prominent gastric bubble noted over the left upper quadrant of the abdomen. Posterior right-sided rib fracture seen on recent CT not evident on x-ray.  IMPRESSION: Low volume film without acute cardiopulmonary findings.   Electronically Signed   By: Kennith Center M.D.   On: 11/10/2014 08:00   Dg Chest Portable 1 View  11/09/2014   CLINICAL DATA:  Fall  EXAM: PORTABLE CHEST - 1 VIEW  COMPARISON:  None.  FINDINGS: Heart size at upper limits of normal. Lungs are grossly clear. Support apparatus artifact is present over the mid chest. No gross evidence for pneumothorax. No displaced fracture identified.  IMPRESSION: No gross evidence for acute cardiopulmonary process. Borderline cardiomegaly may in part be related to portable supine AP technique. Chest CT will be subsequently performed and dictated separately.   Electronically Signed   By: Christiana Pellant M.D.   On: 11/09/2014 15:56   Ct Maxillofacial Wo Cm  11/09/2014   CLINICAL DATA:  Patient is status post fall with neck pain and bilateral shoulder pain. Patient not able to move lower extremities, with tingling in hands and arms.  EXAM: CT HEAD WITHOUT CONTRAST  TECHNIQUE: Contiguous axial images were obtained from the vertex to the thoracic inlet without intravenous contrast.  COMPARISON:  None.  FINDINGS: CT of the head: No mass effect or midline shift. No  evidence of acute intracranial hemorrhage, or infarction. No abnormal extra-axial fluid collections. Gray-white matter differentiation is normal. Basal cisterns are preserved.  No depressed skull fractures. Visualized paranasal sinuses and mastoid air cells are not opacified. There is a soft tissue injury of the right forehead superior to the right orbit containing punctate hyperdense foreign body in a 2 mm gas bubble. No underlying osseous injury seen.  CT of the cervical spine: There is a comminuted minimally displaced fracture of the left inferior articular facet of C5 and minimally displaced fracture of the left superior articular facet of C6. A linear calcific fragment is seen within the L5 left neural foramen, which likely represents a fracture fragment. Additionally, there is a fracture of the posterior process of C4, with left lateral displacement of the distal fracture fragment. There is also a nondisplaced fracture of the posterior process of C5. The may be a tiny avulsion fracture off of the posterior  facet of C6. There is no evidence of alignment abnormality. No evidence of epidural hematoma or spinal canal narrowing.  There are multilevel degenerative changes of the cervical spine with disc height loss, endplate sclerosis and posterior and anterior osteophytes. Ligamentous calcifications are also seen.  CT of the face: The globes and extraocular muscles appear symmetrical. No air fluid levels in the paranasal sinuses.  The frontal bones, orbital rims, maxillary antral walls, nasal bones, nasal septum, nasal spine, maxilla, pterygoid plates, zygomatic arches, temporomandibular joints, and mandibles appear intact.  No displaced fractures are identified. Visualized thyroid cartilage and hyoid bone appear intact.  IMPRESSION: No CT evidence of intracranial traumatic injury.  Comminuted fracture of left C5 articular facet, and minimally displaced fracture of the left C6 articular facet, with possible minute  fracture fragment within C5 left neural foramen.  Fractures of C4, C5 and likely C6 posterior processes.  No evidence of epidural hematoma, or spinal canal narrowing.  No evidence of facial fractures.  Given the severity of patient's symptoms, MRI of the cervical spine may be considered, when clinically feasible.  Key findings were discussed with Violeta Gelinas at the completion of the exam.   Electronically Signed   By: Ted Mcalpine M.D.   On: 11/09/2014 17:17    Anti-infectives: Anti-infectives    None      Assessment/Plan: s/p Procedure(s): C5-6 Anterior cervical decompression/diskectomy/fusion Continue neuro monitoring F/u NSG recs.  LOS: 2 days    [employer]

## 2014-11-11 NOTE — Progress Notes (Signed)
Subjective: Patient resting comfortably in bed, immobilized in [place] cervical collar. Dr. Fredrich Birks notes indicate that he is planning on proceeding with surgery next week.  Objective: Vital signs in last 24 hours: Filed Vitals:   11/11/14 0615 11/11/14 0630 11/11/14 0645 11/11/14 0700  BP:  103/49  101/45  Pulse: 44 46 54 53  Temp:      TempSrc:      Resp: 15 15 16 15   Height:      Weight:      SpO2: 99% 99% 100% 99%    Intake/Output from previous day: 08/05 0701 - 08/06 0700 In: 3008 [I.V.:3008] Out: 760 [Urine:760] Intake/Output this shift:    Physical Exam:  Awake and alert, following commands. Deltoid and biceps 5, triceps 4, intrinsics and grip 0-1, lower extremities 0-1.  CBC  Recent Labs  11/10/14 0600 11/11/14 0500  WBC 15.9* 13.8*  HGB 13.4 11.3*  HCT 38.8* 33.7*  PLT 199 149*   BMET  Recent Labs  11/10/14 0600 11/11/14 0500  NA 139 136  K 4.3 5.0  CL 104 105  CO2 28 27  GLUCOSE 151* 139*  BUN 18 23*  CREATININE 1.18 1.25*  CALCIUM 8.4* 7.8*    Studies/Results: Dg Elbow Complete Right  11/09/2014   CLINICAL DATA:  Fall with posterior elbow laceration and pain.  EXAM: RIGHT ELBOW - COMPLETE 3+ VIEW  COMPARISON:  None.  FINDINGS: Soft tissue swelling around the olecranon with faint foreign body measuring 2 mm. There is also a metallic appearing foreign body at the olecranon medially measuring 3 mm. No evidence of fracture, dislocation, or joint effusion.  Note that positioning is nonstandard due to patient condition, and this best obtainable exam is decreasing sensitivity.  IMPRESSION: 1. Two foreign bodies about the olecranon, up to 3 mm. 2. No evidence of fracture.   Electronically Signed   By: Marnee Spring M.D.   On: 11/09/2014 18:26   Ct Head Wo Contrast  11/09/2014   CLINICAL DATA:  Patient is status post fall with neck pain and bilateral shoulder pain. Patient not able to move lower extremities, with tingling in hands and arms.  EXAM: CT HEAD  WITHOUT CONTRAST  TECHNIQUE: Contiguous axial images were obtained from the vertex to the thoracic inlet without intravenous contrast.  COMPARISON:  None.  FINDINGS: CT of the head: No mass effect or midline shift. No evidence of acute intracranial hemorrhage, or infarction. No abnormal extra-axial fluid collections. Gray-white matter differentiation is normal. Basal cisterns are preserved.  No depressed skull fractures. Visualized paranasal sinuses and mastoid air cells are not opacified. There is a soft tissue injury of the right forehead superior to the right orbit containing punctate hyperdense foreign body in a 2 mm gas bubble. No underlying osseous injury seen.  CT of the cervical spine: There is a comminuted minimally displaced fracture of the left inferior articular facet of C5 and minimally displaced fracture of the left superior articular facet of C6. A linear calcific fragment is seen within the L5 left neural foramen, which likely represents a fracture fragment. Additionally, there is a fracture of the posterior process of C4, with left lateral displacement of the distal fracture fragment. There is also a nondisplaced fracture of the posterior process of C5. The may be a tiny avulsion fracture off of the posterior facet of C6. There is no evidence of alignment abnormality. No evidence of epidural hematoma or spinal canal narrowing.  There are multilevel degenerative changes of the cervical spine  with disc height loss, endplate sclerosis and posterior and anterior osteophytes. Ligamentous calcifications are also seen.  CT of the face: The globes and extraocular muscles appear symmetrical. No air fluid levels in the paranasal sinuses.  The frontal bones, orbital rims, maxillary antral walls, nasal bones, nasal septum, nasal spine, maxilla, pterygoid plates, zygomatic arches, temporomandibular joints, and mandibles appear intact.  No displaced fractures are identified. Visualized thyroid cartilage and hyoid  bone appear intact.  IMPRESSION: No CT evidence of intracranial traumatic injury.  Comminuted fracture of left C5 articular facet, and minimally displaced fracture of the left C6 articular facet, with possible minute fracture fragment within C5 left neural foramen.  Fractures of C4, C5 and likely C6 posterior processes.  No evidence of epidural hematoma, or spinal canal narrowing.  No evidence of facial fractures.  Given the severity of patient's symptoms, MRI of the cervical spine may be considered, when clinically feasible.  Key findings were discussed with Violeta Gelinas at the completion of the exam.   Electronically Signed   By: Ted Mcalpine M.D.   On: 11/09/2014 17:17   Ct Chest W Contrast  11/09/2014   CLINICAL DATA:  Fall.  Cannot move lower extremities.  EXAM: CT CHEST, ABDOMEN, AND PELVIS WITH CONTRAST  TECHNIQUE: Multidetector CT imaging of the chest, abdomen and pelvis was performed following the standard protocol during bolus administration of intravenous contrast.  CONTRAST:  OMNIPAQUE IOHEXOL 300 MG/ML  SOLN  COMPARISON:  None.  FINDINGS: CT CHEST FINDINGS  THORACIC INLET/BODY WALL:  No acute abnormality.  MEDIASTINUM:  Normal heart size. No pericardial effusion. No acute vascular abnormality. No adenopathy.  LUNG WINDOWS:  No contusion, hemothorax, or pneumothorax.  4 mm pulmonary nodule in the upper lingula on image 28. 2 mm subpleural nodule on the right on image 34.  OSSEOUS:  See below  CT ABDOMEN AND PELVIS FINDINGS  BODY WALL: Unremarkable.  Liver: No focal abnormality.  Biliary: No evidence of biliary obstruction or stone.  Pancreas: Unremarkable.  Spleen: Unremarkable.  Adrenals: Unremarkable.  Kidneys and ureters: No traumatic findings.  Bladder: Unremarkable.  Reproductive: Enlarged prostate, projecting into the bladder base.  Bowel: No evidence of injury  Retroperitoneum: No mass or adenopathy.  Peritoneum: No free fluid or gas.  Vascular: No acute findings.  OSSEOUS:  Nondisplaced posterior right ninth and tenth rib fractures.  Left C5 articular process fracture with bony fragment in the C5-6 foramen, as described on dedicated CT of the cervical spine. No cervical or thoracic spine fractures identified.  IMPRESSION: 1. Nondisplaced posterior right ninth and tenth rib fractures. 2. No evidence of intrathoracic or intra-abdominal injury. 3. 4 mm pulmonary nodule in the lingula. If the patient is at high risk for bronchogenic carcinoma, follow-up chest CT at 1 year is recommended. If the patient is at low risk, no follow-up is needed. This recommendation follows the consensus statement: Guidelines for Management of Small Pulmonary Nodules Detected on CT Scans: A Statement from the Fleischner Society as published in Radiology 2005; 237:395-400. 4. Enlarged prostate.   Electronically Signed   By: Marnee Spring M.D.   On: 11/09/2014 16:49   Ct Cervical Spine Wo Contrast  11/09/2014   CLINICAL DATA:  Patient is status post fall with neck pain and bilateral shoulder pain. Patient not able to move lower extremities, with tingling in hands and arms.  EXAM: CT HEAD WITHOUT CONTRAST  TECHNIQUE: Contiguous axial images were obtained from the vertex to the thoracic inlet without intravenous contrast.  COMPARISON:  None.  FINDINGS: CT of the head: No mass effect or midline shift. No evidence of acute intracranial hemorrhage, or infarction. No abnormal extra-axial fluid collections. Gray-white matter differentiation is normal. Basal cisterns are preserved.  No depressed skull fractures. Visualized paranasal sinuses and mastoid air cells are not opacified. There is a soft tissue injury of the right forehead superior to the right orbit containing punctate hyperdense foreign body in a 2 mm gas bubble. No underlying osseous injury seen.  CT of the cervical spine: There is a comminuted minimally displaced fracture of the left inferior articular facet of C5 and minimally displaced fracture of the  left superior articular facet of C6. A linear calcific fragment is seen within the L5 left neural foramen, which likely represents a fracture fragment. Additionally, there is a fracture of the posterior process of C4, with left lateral displacement of the distal fracture fragment. There is also a nondisplaced fracture of the posterior process of C5. The may be a tiny avulsion fracture off of the posterior facet of C6. There is no evidence of alignment abnormality. No evidence of epidural hematoma or spinal canal narrowing.  There are multilevel degenerative changes of the cervical spine with disc height loss, endplate sclerosis and posterior and anterior osteophytes. Ligamentous calcifications are also seen.  CT of the face: The globes and extraocular muscles appear symmetrical. No air fluid levels in the paranasal sinuses.  The frontal bones, orbital rims, maxillary antral walls, nasal bones, nasal septum, nasal spine, maxilla, pterygoid plates, zygomatic arches, temporomandibular joints, and mandibles appear intact.  No displaced fractures are identified. Visualized thyroid cartilage and hyoid bone appear intact.  IMPRESSION: No CT evidence of intracranial traumatic injury.  Comminuted fracture of left C5 articular facet, and minimally displaced fracture of the left C6 articular facet, with possible minute fracture fragment within C5 left neural foramen.  Fractures of C4, C5 and likely C6 posterior processes.  No evidence of epidural hematoma, or spinal canal narrowing.  No evidence of facial fractures.  Given the severity of patient's symptoms, MRI of the cervical spine may be considered, when clinically feasible.  Key findings were discussed with Violeta Gelinas at the completion of the exam.   Electronically Signed   By: Ted Mcalpine M.D.   On: 11/09/2014 17:17   Mr Cervical Spine Wo Contrast  11/10/2014   CLINICAL DATA:  Fall from roof. Tingling in the arms and fingers. Patient unable to move legs.  Follow-up abnormal CT.  EXAM: MRI CERVICAL SPINE WITHOUT CONTRAST  TECHNIQUE: Multiplanar, multisequence MR imaging of the cervical spine was performed. No intravenous contrast was administered.  COMPARISON:  Cervical spine CT 11/09/2014.  FINDINGS: Alignment: Mildly exaggerated cervical lordosis. No spondylolisthesis.  Vertebrae: Bone marrow edema around the fractures at C5 and C6 is poorly visible on the inversion recovery images, partially due to technical factors. No aggressive osseous lesions.  Cord: The upper cervical cord appears normal. Abnormal cord signal is present from C5 through C6, best seen on inversion recovery images. On gradient echo images, there is an intramedullary hematoma dorsal the C6 measuring about 8 mm. This is more difficult to appreciate on the axial image sets. Cervical cord is colon compatible with cord contusion.  Posterior Fossa: Normal.  Vertebral Arteries: Flow voids present in both vertebral arteries.  Paraspinal tissues: There is both prevertebral edema and posterior paraspinal edema associated with cervical spine fractures. Extensive cervical strain is present with posterior paraspinal muscular edema. No large paraspinal muscular hematoma is  identified. Although these images are mildly technically degraded, on the inversion recovery images, there appears to be discontinuity of the anterior longitudinal ligament at C5-C6 consistent with a tear. There is also probably posterior longitudinal ligament disruption given the cord injury.  Disc Signal: Diffuse disc desiccation.  Disc levels:  C2-C3:  Negative.  C3-C4: Bilateral foraminal stenosis associated with uncovertebral spurring. Central canal patent.  C4-C5: Broad-based disc osteophyte complex and posterior ligamentum flavum redundancy produce mild central stenosis. There is bilateral uncovertebral spurring producing bilateral foraminal stenosis.  C5-C6: Cord swelling is present at this level. Superimposed degenerative disease is  present with broad-based disc osteophyte complex. In conjunction with the cord swelling, there is almost complete effacement of the subarachnoid space at C5-C6 disc level. There is LEFT-greater-than-RIGHT bilateral foraminal stenosis secondary to uncovertebral spurring and the LEFT-sided C6 superior articular process fracture probably contributes to RIGHT foraminal stenosis.  C6-C7: Severe disc degeneration with shallow disc osteophyte complex producing mild central stenosis. Mild bilateral foraminal stenosis secondary to uncovertebral spurring.  C7-T1:  Bilateral facet arthrosis without stenosis.  IMPRESSION: 1. Cervical cord contusion extending dorsal to C5 and C6 vertebrae. intramedullary hematoma measuring 8 mm dorsal to the C6 vertebra. Combination of cord swelling and degenerative disease effaces the subarachnoid space at the level of the cord contusion/hematoma. 2. Moderate multilevel cervical spondylosis detailed above. 3. LEFT eccentric cervical strain associated with previously demonstrated cervical spine fracture. 4. Critical Value/emergent results were called by telephone at the time of interpretation on 11/10/2014 at 1:56 pm to Dr. Jimmye Norman , who verbally acknowledged these results.   Electronically Signed   By: Andreas Newport M.D.   On: 11/10/2014 13:56   Ct Abdomen Pelvis W Contrast  11/09/2014   CLINICAL DATA:  Fall.  Cannot move lower extremities.  EXAM: CT CHEST, ABDOMEN, AND PELVIS WITH CONTRAST  TECHNIQUE: Multidetector CT imaging of the chest, abdomen and pelvis was performed following the standard protocol during bolus administration of intravenous contrast.  CONTRAST:  OMNIPAQUE IOHEXOL 300 MG/ML  SOLN  COMPARISON:  None.  FINDINGS: CT CHEST FINDINGS  THORACIC INLET/BODY WALL:  No acute abnormality.  MEDIASTINUM:  Normal heart size. No pericardial effusion. No acute vascular abnormality. No adenopathy.  LUNG WINDOWS:  No contusion, hemothorax, or pneumothorax.  4 mm pulmonary nodule  in the upper lingula on image 28. 2 mm subpleural nodule on the right on image 34.  OSSEOUS:  See below  CT ABDOMEN AND PELVIS FINDINGS  BODY WALL: Unremarkable.  Liver: No focal abnormality.  Biliary: No evidence of biliary obstruction or stone.  Pancreas: Unremarkable.  Spleen: Unremarkable.  Adrenals: Unremarkable.  Kidneys and ureters: No traumatic findings.  Bladder: Unremarkable.  Reproductive: Enlarged prostate, projecting into the bladder base.  Bowel: No evidence of injury  Retroperitoneum: No mass or adenopathy.  Peritoneum: No free fluid or gas.  Vascular: No acute findings.  OSSEOUS: Nondisplaced posterior right ninth and tenth rib fractures.  Left C5 articular process fracture with bony fragment in the C5-6 foramen, as described on dedicated CT of the cervical spine. No cervical or thoracic spine fractures identified.  IMPRESSION: 1. Nondisplaced posterior right ninth and tenth rib fractures. 2. No evidence of intrathoracic or intra-abdominal injury. 3. 4 mm pulmonary nodule in the lingula. If the patient is at high risk for bronchogenic carcinoma, follow-up chest CT at 1 year is recommended. If the patient is at low risk, no follow-up is needed. This recommendation follows the consensus statement: Guidelines for Management of Small  Pulmonary Nodules Detected on CT Scans: A Statement from the Fleischner Society as published in Radiology 2005; 237:395-400. 4. Enlarged prostate.   Electronically Signed   By: Marnee Spring M.D.   On: 11/09/2014 16:49   Dg Pelvis Portable  11/09/2014   CLINICAL DATA:  Patient fell from ladder  EXAM: PORTABLE PELVIS 1-2 VIEWS  COMPARISON:  None.  FINDINGS: There is no evidence of pelvic fracture or dislocation. Joint spaces appear intact. No erosive change.  IMPRESSION: No demonstrable fracture or dislocation. No appreciable arthropathy.   Electronically Signed   By: Bretta Bang III M.D.   On: 11/09/2014 15:55   Dg Chest Port 1 View  11/10/2014   CLINICAL DATA:   Subsequent encounter for multiple rib fractures  EXAM: PORTABLE CHEST - 1 VIEW  COMPARISON:  11/09/2014.  FINDINGS: 0615 hours. Lung volumes are low without edema, focal airspace consolidation, or pleural effusion. Cardiopericardial silhouette is at upper limits of normal for size. Telemetry leads overlie the chest. Prominent gastric bubble noted over the left upper quadrant of the abdomen. Posterior right-sided rib fracture seen on recent CT not evident on x-ray.  IMPRESSION: Low volume film without acute cardiopulmonary findings.   Electronically Signed   By: Kennith Center M.D.   On: 11/10/2014 08:00   Dg Chest Portable 1 View  11/09/2014   CLINICAL DATA:  Fall  EXAM: PORTABLE CHEST - 1 VIEW  COMPARISON:  None.  FINDINGS: Heart size at upper limits of normal. Lungs are grossly clear. Support apparatus artifact is present over the mid chest. No gross evidence for pneumothorax. No displaced fracture identified.  IMPRESSION: No gross evidence for acute cardiopulmonary process. Borderline cardiomegaly may in part be related to portable supine AP technique. Chest CT will be subsequently performed and dictated separately.   Electronically Signed   By: Christiana Pellant M.D.   On: 11/09/2014 15:56   Ct Maxillofacial Wo Cm  11/09/2014   CLINICAL DATA:  Patient is status post fall with neck pain and bilateral shoulder pain. Patient not able to move lower extremities, with tingling in hands and arms.  EXAM: CT HEAD WITHOUT CONTRAST  TECHNIQUE: Contiguous axial images were obtained from the vertex to the thoracic inlet without intravenous contrast.  COMPARISON:  None.  FINDINGS: CT of the head: No mass effect or midline shift. No evidence of acute intracranial hemorrhage, or infarction. No abnormal extra-axial fluid collections. Gray-white matter differentiation is normal. Basal cisterns are preserved.  No depressed skull fractures. Visualized paranasal sinuses and mastoid air cells are not opacified. There is a soft  tissue injury of the right forehead superior to the right orbit containing punctate hyperdense foreign body in a 2 mm gas bubble. No underlying osseous injury seen.  CT of the cervical spine: There is a comminuted minimally displaced fracture of the left inferior articular facet of C5 and minimally displaced fracture of the left superior articular facet of C6. A linear calcific fragment is seen within the L5 left neural foramen, which likely represents a fracture fragment. Additionally, there is a fracture of the posterior process of C4, with left lateral displacement of the distal fracture fragment. There is also a nondisplaced fracture of the posterior process of C5. The may be a tiny avulsion fracture off of the posterior facet of C6. There is no evidence of alignment abnormality. No evidence of epidural hematoma or spinal canal narrowing.  There are multilevel degenerative changes of the cervical spine with disc height loss, endplate sclerosis and posterior  and anterior osteophytes. Ligamentous calcifications are also seen.  CT of the face: The globes and extraocular muscles appear symmetrical. No air fluid levels in the paranasal sinuses.  The frontal bones, orbital rims, maxillary antral walls, nasal bones, nasal septum, nasal spine, maxilla, pterygoid plates, zygomatic arches, temporomandibular joints, and mandibles appear intact.  No displaced fractures are identified. Visualized thyroid cartilage and hyoid bone appear intact.  IMPRESSION: No CT evidence of intracranial traumatic injury.  Comminuted fracture of left C5 articular facet, and minimally displaced fracture of the left C6 articular facet, with possible minute fracture fragment within C5 left neural foramen.  Fractures of C4, C5 and likely C6 posterior processes.  No evidence of epidural hematoma, or spinal canal narrowing.  No evidence of facial fractures.  Given the severity of patient's symptoms, MRI of the cervical spine may be considered, when  clinically feasible.  Key findings were discussed with Violeta Gelinas at the completion of the exam.   Electronically Signed   By: Ted Mcalpine M.D.   On: 11/09/2014 17:17    Assessment/Plan: Continue supportive care, surgery next week with Dr. Venetia Maxon.   Hewitt Shorts, MD 11/11/2014, 8:17 AM

## 2014-11-12 LAB — CBC
HCT: 33.2 % — ABNORMAL LOW (ref 39.0–52.0)
HEMOGLOBIN: 11.4 g/dL — AB (ref 13.0–17.0)
MCH: 31 pg (ref 26.0–34.0)
MCHC: 34.3 g/dL (ref 30.0–36.0)
MCV: 90.2 fL (ref 78.0–100.0)
Platelets: 142 10*3/uL — ABNORMAL LOW (ref 150–400)
RBC: 3.68 MIL/uL — ABNORMAL LOW (ref 4.22–5.81)
RDW: 12.5 % (ref 11.5–15.5)
WBC: 11.5 10*3/uL — ABNORMAL HIGH (ref 4.0–10.5)

## 2014-11-12 NOTE — Progress Notes (Signed)
Follow up - Trauma and Critical Care  Patient Details:    Carl [name] is an 64 y.o. male.  Lines/tubes : PICC / Midline Double Lumen 11/10/14 PICC Left Basilic 43 cm 1 cm (Active)  Indication for Insertion or Continuance of Line Prolonged intravenous therapies 11/11/2014  8:00 PM  Exposed Catheter (cm) 1 cm 11/10/2014  2:00 PM  Site Assessment Clean;Dry;Intact 11/11/2014  8:00 PM  Lumen #1 Status Infusing 11/11/2014  8:00 PM  Lumen #2 Status Infusing 11/11/2014  8:00 PM  Dressing Type Transparent 11/11/2014  8:00 PM  Dressing Status Clean;Dry;Intact;Antimicrobial disc in place 11/11/2014  8:00 PM  Line Care Connections checked and tightened 11/11/2014  8:00 PM  Dressing Change Due 11/17/14 11/11/2014  8:00 AM     Urethral Catheter Donetta Potts, RN  Temperature probe 16 Fr. (Active)  Indication for Insertion or Continuance of Catheter Unstable spinal/crush injuries 11/11/2014  8:00 PM  Site Assessment Clean;Intact;Dry 11/11/2014  8:00 PM  Catheter Maintenance Bag below level of bladder;Catheter secured;Drainage bag/tubing not touching floor;Insertion date on drainage bag;Seal intact 11/11/2014  8:00 PM  Collection Container Standard drainage bag 11/11/2014  8:00 PM  Securement Method Leg strap 11/11/2014  8:00 PM  Urinary Catheter Interventions Unclamped 11/11/2014  8:00 PM  Input (mL) 2000 mL 11/09/2014  5:12 PM  Output (mL) 230 mL 11/12/2014  8:42 AM    Microbiology/Sepsis markers: Results for orders placed or performed during the hospital encounter of 11/09/14  MRSA PCR Screening     Status: None   Collection Time: 11/09/14  6:38 PM  Result Value Ref Range Status   MRSA by PCR NEGATIVE NEGATIVE Final    Comment:        The GeneXpert MRSA Assay (FDA approved for NASAL specimens only), is one component of a comprehensive MRSA colonization surveillance program. It is not intended to diagnose MRSA infection nor to guide or monitor treatment for MRSA infections.     Anti-infectives:  Anti-infectives    None      Best Practice/Protocols:  VTE Prophylaxis: Mechanical GI Prophylaxis: Proton Pump Inhibitor Bradycardia- dopamine wean  Consults: Treatment Team:  Maeola Harman, MD    Events:  Subjective:    Overnight Issues: No acute events. Dopamine down to 4.  Objective:  Vital signs for last 24 hours: Temp:  [98.4 F (36.9 C)-102.2 F (39 C)] 98.9 F (37.2 C) (08/07 0752) Pulse Rate:  [41-65] 47 (08/07 0930) Resp:  [5-18] 12 (08/07 0930) BP: (88-150)/(42-120) 91/51 mmHg (08/07 0930) SpO2:  [91 %-97 %] 97 % (08/07 0930) Arterial Line BP: (58-132)/(44-66) 111/51 mmHg (08/06 1900)  Hemodynamic parameters for last 24 hours:    Intake/Output from previous day: 08/06 0701 - 08/07 0700 In: 3223.4 [I.V.:3223.4] Out: 1610 [Urine:1610]  Intake/Output this shift: Total I/O In: 462.8 [P.O.:60; I.V.:402.8] Out: 230 [Urine:230]  Vent settings for last 24 hours:    Physical Exam:  General: alert and no respiratory distress Neuro: alert, oriented, weakness right upper extremity, weakness right lower extremity, weakness left upper extremity and weakness left lower extremity HEENT/Neck: no JVD and PERRL Resp: clear to auscultation bilaterally CVS: regular rate and rhythm, S1, S2 normal, no murmur, click, rub or gallop GI: soft, nontender, BS WNL, no r/g Skin: no rash Extremities: no edema, no erythema, pulses WNL  No results found for this or any previous visit (from the past 24 hour(s)).   Assessment/Plan:   NEURO  C5 fx with cord contusions   Plan: continue physical therapy for extremity strengthening  PULM  no issues   Plan: continue IS and resp care for immobilization  CARDIO  Huston Foley cardia   Plan: wean dopamine, tolerate HR >48  RENAL  No issues   Plan: CPM  GI  No issues   Plan: cpm  ID  NO ISSUES   Plan: CPM  HEME  No issues   Plan: mechanical prophylaxis  ENDO No issues   Plan: CPM  Global Issues   c5 fracture with lower extremity deficits.  Bradycardia (has bradycardia at baseline) wean dopamine with goal for off today.   LOS: 3 days   Additional comments:I reviewed the patient's new clinical lab test results. stable CBC  Critical Care Total Time*: 15 Minutes  De Blanch Kinsinger 11/12/2014  *Care during the described time interval was provided by me and/or other providers on the critical care team.  I have reviewed this patient's available data, including medical history, events of note, physical examination and test results as part of my evaluation.

## 2014-11-12 NOTE — Progress Notes (Signed)
Subjective: Patient resting in bed, without complaints. Immobilized in Aspen cervical collar.  Objective: Vital signs in last 24 hours: Filed Vitals:   11/12/14 0600 11/12/14 0630 11/12/14 0700 11/12/14 0752  BP: 112/54 107/56 116/55   Pulse: 44 44 46   Temp:    98.9 F (37.2 C)  TempSrc:    Oral  Resp: 14 15 13    Height:      Weight:      SpO2: 95% 94% 96%     Intake/Output from previous day: 08/06 0701 - 08/07 0700 In: 3223.4 [I.V.:3223.4] Out: 1610 [Urine:1610] Intake/Output this shift:    Physical Exam:  Awake alert, following commands. Motor function without change: Deltoid and biceps 5, triceps 4, intrinsics and grip 0-1, lower extremities 0-1.  CBC  Recent Labs  11/10/14 0600 11/11/14 0500  WBC 15.9* 13.8*  HGB 13.4 11.3*  HCT 38.8* 33.7*  PLT 199 149*   BMET  Recent Labs  11/10/14 0600 11/11/14 0500  NA 139 136  K 4.3 5.0  CL 104 105  CO2 28 27  GLUCOSE 151* 139*  BUN 18 23*  CREATININE 1.18 1.25*  CALCIUM 8.4* 7.8*    Studies/Results: Mr Cervical Spine Wo Contrast  11/10/2014   CLINICAL DATA:  Fall from roof. Tingling in the arms and fingers. Patient unable to move legs. Follow-up abnormal CT.  EXAM: MRI CERVICAL SPINE WITHOUT CONTRAST  TECHNIQUE: Multiplanar, multisequence MR imaging of the cervical spine was performed. No intravenous contrast was administered.  COMPARISON:  Cervical spine CT 11/09/2014.  FINDINGS: Alignment: Mildly exaggerated cervical lordosis. No spondylolisthesis.  Vertebrae: Bone marrow edema around the fractures at C5 and C6 is poorly visible on the inversion recovery images, partially due to technical factors. No aggressive osseous lesions.  Cord: The upper cervical cord appears normal. Abnormal cord signal is present from C5 through C6, best seen on inversion recovery images. On gradient echo images, there is an intramedullary hematoma dorsal the C6 measuring about 8 mm. This is more difficult to appreciate on the axial image  sets. Cervical cord is colon compatible with cord contusion.  Posterior Fossa: Normal.  Vertebral Arteries: Flow voids present in both vertebral arteries.  Paraspinal tissues: There is both prevertebral edema and posterior paraspinal edema associated with cervical spine fractures. Extensive cervical strain is present with posterior paraspinal muscular edema. No large paraspinal muscular hematoma is identified. Although these images are mildly technically degraded, on the inversion recovery images, there appears to be discontinuity of the anterior longitudinal ligament at C5-C6 consistent with a tear. There is also probably posterior longitudinal ligament disruption given the cord injury.  Disc Signal: Diffuse disc desiccation.  Disc levels:  C2-C3:  Negative.  C3-C4: Bilateral foraminal stenosis associated with uncovertebral spurring. Central canal patent.  C4-C5: Broad-based disc osteophyte complex and posterior ligamentum flavum redundancy produce mild central stenosis. There is bilateral uncovertebral spurring producing bilateral foraminal stenosis.  C5-C6: Cord swelling is present at this level. Superimposed degenerative disease is present with broad-based disc osteophyte complex. In conjunction with the cord swelling, there is almost complete effacement of the subarachnoid space at C5-C6 disc level. There is LEFT-greater-than-RIGHT bilateral foraminal stenosis secondary to uncovertebral spurring and the LEFT-sided C6 superior articular process fracture probably contributes to RIGHT foraminal stenosis.  C6-C7: Severe disc degeneration with shallow disc osteophyte complex producing mild central stenosis. Mild bilateral foraminal stenosis secondary to uncovertebral spurring.  C7-T1:  Bilateral facet arthrosis without stenosis.  IMPRESSION: 1. Cervical cord contusion extending dorsal to C5  and C6 vertebrae. intramedullary hematoma measuring 8 mm dorsal to the C6 vertebra. Combination of cord swelling and  degenerative disease effaces the subarachnoid space at the level of the cord contusion/hematoma. 2. Moderate multilevel cervical spondylosis detailed above. 3. LEFT eccentric cervical strain associated with previously demonstrated cervical spine fracture. 4. Critical Value/emergent results were called by telephone at the time of interpretation on 11/10/2014 at 1:56 pm to Dr. Jimmye Norman , who verbally acknowledged these results.   Electronically Signed   By: Andreas Newport M.D.   On: 11/10/2014 13:56    Assessment/Plan: For OR this week with Dr. Venetia Maxon.   Hewitt Shorts, MD 11/12/2014, 8:10 AM

## 2014-11-13 LAB — CBC
HCT: 31.5 % — ABNORMAL LOW (ref 39.0–52.0)
Hemoglobin: 11 g/dL — ABNORMAL LOW (ref 13.0–17.0)
MCH: 31 pg (ref 26.0–34.0)
MCHC: 34.9 g/dL (ref 30.0–36.0)
MCV: 88.7 fL (ref 78.0–100.0)
PLATELETS: 140 10*3/uL — AB (ref 150–400)
RBC: 3.55 MIL/uL — AB (ref 4.22–5.81)
RDW: 12.3 % (ref 11.5–15.5)
WBC: 8.3 10*3/uL (ref 4.0–10.5)

## 2014-11-13 MED ORDER — PREGABALIN 75 MG PO CAPS
75.0000 mg | ORAL_CAPSULE | Freq: Two times a day (BID) | ORAL | Status: DC
  Administered 2014-11-13 – 2014-11-20 (×14): 75 mg via ORAL
  Filled 2014-11-13 (×14): qty 1

## 2014-11-13 NOTE — Progress Notes (Addendum)
Subjective: Patient reports not much change  Objective: Vital signs in last 24 hours: Temp:  [98.4 F (36.9 C)-99.6 F (37.6 C)] 99.1 F (37.3 C) (08/08 0800) Pulse Rate:  [43-58] 45 (08/08 0830) Resp:  [0-17] 15 (08/08 0830) BP: (76-158)/(38-85) 95/46 mmHg (08/08 0830) SpO2:  [89 %-98 %] 94 % (08/08 0830)  Intake/Output from previous day: 08/07 0701 - 08/08 0700 In: 3192 [P.O.:60; I.V.:3132] Out: 2070 [Urine:2070] Intake/Output this shift: Total I/O In: 128.7 [I.V.:128.7] Out: -   Physical Exam: Some improvement in left hand intrinsics and minimal improvement in right.  Legs stable with better movement in right foot, minimal movement in left leg.  Better quadriceps contraction on right than left, but not greater than gravity today.  Sensory exam is stable.  Lab Results:  Recent Labs  11/12/14 0932 11/13/14 0505  WBC 11.5* 8.3  HGB 11.4* 11.0*  HCT 33.2* 31.5*  PLT 142* 140*   BMET  Recent Labs  11/11/14 0500  NA 136  K 5.0  CL 105  CO2 27  GLUCOSE 139*  BUN 23*  CREATININE 1.25*  CALCIUM 7.8*    Studies/Results: No results found.  Assessment/Plan: Patient with stable quadriparesis.  Plan ACDF C 56 tomorrow.  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  I have consulted Rehab service.    LOS: 4 days    Dorian Heckle, MD 11/13/2014, 9:22 AM

## 2014-11-13 NOTE — Progress Notes (Signed)
Patient ID: Carl [name], male   DOB: 1950/07/04, 64 y.o.   MRN: [id number]    Subjective: C/O trunk numbness/pain chest wall and abdominal wall  Objective: Vital signs in last 24 hours: Temp:  [98.4 F (36.9 C)-99.6 F (37.6 C)] 99.1 F (37.3 C) (08/08 0800) Pulse Rate:  [43-58] 45 (08/08 0830) Resp:  [0-17] 15 (08/08 0830) BP: (76-158)/(38-85) 95/46 mmHg (08/08 0830) SpO2:  [89 %-98 %] 94 % (08/08 0830)    Intake/Output from previous day: 08/07 0701 - 08/08 0700 In: 3192 [P.O.:60; I.V.:3132] Out: 2070 [Urine:2070] Intake/Output this shift: Total I/O In: 128.7 [I.V.:128.7] Out: -   General appearance: alert and cooperative Head: R forehead/lid lac Neck: collar Resp: clear to auscultation bilaterally Cardio: regular rate and rhythm GI: soft, NT, ND Neuro: min grip B, moved R foot a little, BLE sens grossly intact  Lab Results: CBC   Recent Labs  11/12/14 0932 11/13/14 0505  WBC 11.5* 8.3  HGB 11.4* 11.0*  HCT 33.2* 31.5*  PLT 142* 140*   BMET  Recent Labs  11/11/14 0500  NA 136  K 5.0  CL 105  CO2 27  GLUCOSE 139*  BUN 23*  CREATININE 1.25*  CALCIUM 7.8*   PT/INR No results for input(s): LABPROT, INR in the last 72 hours. ABG No results for input(s): PHART, HCO3 in the last 72 hours.  Invalid input(s): PCO2, PO2  Studies/Results: No results found.  Anti-infectives: Anti-infectives    None      Assessment/Plan: Fall C5-6 FXs and cord injury - for ACDF by Dr. Venetia Maxon tomorrow R facial lac Chronic bradycardia and acute neurogenic shock - dopamine and wean as able FEN - NPO at MN, check lytes in AM VTE - PAS Dispo - ICU  LOS: 4 days    Violeta Gelinas, MD, MPH, FACS Trauma: [phone] General Surgery: [phone]  11/13/2014

## 2014-11-13 NOTE — Consult Note (Signed)
Physical Medicine and Rehabilitation Consult   Reason for Consult: Central cord syndrome with quadriparesis  Referring Physician: Dr. Venetia Maxon.    HPI: Carl [name] is a 64 y.o. male who fell from the roof while painting hsi house with complaints of neck pain and inability to move BLE wiand weakness BUE.  Patient hypotensive at admission requiring dopamine and GCS 15. Bedside ultrasound negative and patient with poor air movement with poor respiratory effort. Work up revealed comminuted left C5 and C6 fractures with prevertebral and paraspinal edema, extensive cervical strain and cervical cord contusion with swelling extending C5-C6 with intramedullary hematoma, nondisplaced right 9th and 10th ribs and right eyelid laceration. Patient with numbness BLE as well as numbness/pain in chest and abdominal wall.  Right brow lacerations repaired by Dr. Randon Goldsmith and no signs of globe injury noted.  He was placed on bedrest with Aspen collar in place and underwent ACDF with PEEk cage by Dr. Venetia Maxon on 08/09.  PT/OT evaluations to be done today and CIR consulted in anticipation of extensive rehab needs.   Patient feels like he has gotten some sensation back over the last several days. Still feels numb in the hands as well as feet and legs. Still has Foley, no bowel movement since admission. Review of Systems  HENT: Negative for hearing loss.   Eyes: Negative for blurred vision and double vision.  Respiratory: Negative for cough and shortness of breath.   Cardiovascular: Negative for chest pain and palpitations.  Gastrointestinal: Positive for abdominal pain (chronic for years with "negative" workup) and constipation.  Genitourinary: Positive for urgency (uses Saw Palmetto) and frequency.  Musculoskeletal: Positive for myalgias and back pain.  Neurological: Positive for sensory change and focal weakness. Negative for headaches. Tingling: burning/stinging BUE.   Psychiatric/Behavioral: Positive for  depression. The patient has insomnia.      History reviewed. No pertinent past medical history.    Past Surgical History  Procedure Laterality Date  . Anterior cervical decomp/discectomy fusion N/A 11/14/2014    Procedure: Cervical Five-Cervical Six  Anterior Cervical Decompression/Diskectomy/Fusion;  Surgeon: Maeola Harman, MD;  Location: [facility];  Service: Neurosurgery;  Laterality: N/A;      Family History  Problem Relation Age of Onset  . Heart attack Father   . Alzheimer's disease Mother   . Aneurysm Brother     brain      Social History:    Married. Independent and working as a Scientist, research (life sciences).  He does not use  tobacco, alcohol, or illicit drugs.   Allergies  Allergen Reactions  . Penicillins Hives    Medications Prior to Admission  Medication Sig Dispense Refill  . aspirin EC 81 MG tablet Take 81 mg by mouth daily.    Marland Kitchen ibuprofen (ADVIL,MOTRIN) 200 MG tablet Take 200 mg by mouth every 6 (six) hours as needed for headache, mild pain or moderate pain.    . Saw Palmetto, Serenoa repens, (SAW PALMETTO PO) Take 1 tablet by mouth daily.      Home: Home Living Family/patient expects to be discharged to:: Inpatient rehab Living Arrangements: Alone Available Help at Discharge: Family Type of Home: House Home Access: Stairs to enter Secretary/administrator of Steps: 6 Entrance Stairs-Rails: Left, Right Home Layout: Multi-level, Full bath on main level, Able to live on main level with bedroom/bathroom Alternate Level Stairs-Number of Steps: don't go up to second level, has a basement Bathroom Shower/Tub: Tub only (on main level) Bathroom Toilet: Standard (a little higher than  standard, but not handicapped) Home Equipment: None  Functional History: Prior Function Level of Independence: Independent Comments: pastors a church in [place] Functional Status:  Mobility: Bed Mobility Overal bed mobility: Needs Assistance Bed Mobility: Supine to Sit, Sit to  Sidelying Supine to sit: +2 for physical assistance, Total assist, HOB elevated Sit to sidelying: Total assist, +2 for physical assistance General bed mobility comments: assist for trunk and LE's Transfers General transfer comment: deferred due to patient 9/10 pain sitting EOB      ADL: ADL Overall ADL's : Needs assistance/impaired Eating/Feeding: Total assistance, Bed level Grooming: Wash/dry face, Minimal assistance, Bed level Grooming Details (indicate cue type and reason): using Rt UE  Upper Body Bathing: Total assistance, Bed level Lower Body Bathing: Total assistance, Bed level Upper Body Dressing : Total assistance, Bed level Lower Body Dressing: Total assistance, Bed level Toilet Transfer: Total assistance Toileting- Clothing Manipulation and Hygiene: Total assistance, Bed level  Cognition: Cognition Overall Cognitive Status: Within Functional Limits for tasks assessed Orientation Level: Oriented X4 Cognition Arousal/Alertness: Awake/alert Behavior During Therapy: WFL for tasks assessed/performed Overall Cognitive Status: Within Functional Limits for tasks assessed  Blood pressure 142/46, pulse 50, temperature 99.8 F (37.7 C), temperature source Oral, resp. rate 11, height 5\' 8"  (1.727 m), weight 88.451 kg (195 lb), SpO2 93 %. Physical Exam  Nursing note and vitals reviewed. Constitutional: He is oriented to person, place, and time. He appears well-developed and well-nourished.  HENT:  Head: Normocephalic and atraumatic.  Eyes: Conjunctivae are normal. Pupils are equal, round, and reactive to light.  Neck:  Immobilized by cervical collar  Cardiovascular: Normal rate and regular rhythm.   Respiratory: Effort normal and breath sounds normal. No respiratory distress. He has no wheezes.  GI: Normal appearance. He exhibits distension. Bowel sounds are decreased. There is tenderness.  Musculoskeletal: He exhibits edema (1+ pedal edema bilaterally. ).  Neurological: He  is alert and oriented to person, place, and time.  Speech clear.  Sensory deficits BUE/BLE with reports of of tingling sensation in torso and abdominal area. Quadriplegia with dense paresis BLE and bilateral foot drop.   Skin: Skin is warm and dry.  Dry crusted abrasion right eyebrow.   Psychiatric: His affect is blunt. He is withdrawn. Cognition and memory are normal.  Patient has normal sensation proximal to C6 dermatome. Patient has reduced sensation to pinprick but is able to identify the area touched At bilateral C6 through sacral levels Patient has decreased tone in bilateral Lower extremities He has 3+ right triceps biceps brachial radialis reflex, absent left biceps triceps brachial radialis reflexes Motor strength is 3 minus right deltoid, bicep, tricep, finger extensor and flexor 2 minus right hand intrinsics 3 minus left deltoid, bicep, tricep 2 minus grip and absent hand intrinsics 0/5 bilateral hip flexors and knee extensors ankle dorsiflexors and plantar flexors  No results found for this or any previous visit (from the past 24 hour(s)). Dg Cervical Spine 2-3 Views  11/14/2014   CLINICAL DATA:  Cervical spine fracture.  EXAM: CERVICAL SPINE - 2-3 VIEW  COMPARISON:  MRI and CT 8/4 and 11/10/2014.  FINDINGS: Intraoperative lateral localization radiographs are submitted for interpretation. Initial localization shows probe at C4-C5. Subsequent images demonstrate C5-C6 ACDF. Endotracheal tube and orogastric tube present. Surgical packing is present on the last image in the precervical soft tissues.  IMPRESSION: C5-C6 ACDF.   Electronically Signed   By: Andreas Newport M.D.   On: 11/14/2014 14:22    Assessment/Plan: Diagnosis: C6 [place] B  incomplete tetraplegia, spinal cord injury due to fall with C5-C6 fracture and neurogenic bowel and bladder 1. Does the need for close, 24 hr/day medical supervision in concert with the patient's rehab needs make it unreasonable for this patient to be served  in a less intensive setting? Yes 2. Co-Morbidities requiring supervision/potential complications: Severe bradycardia, fever, Dysphagia 3. Due to bladder management, bowel management, safety, skin/wound care, disease management, medication administration, pain management and patient education, does the patient require 24 hr/day rehab nursing? Yes 4. Does the patient require coordinated care of a physician, rehab nurse, PT (11-2 hrs/day, 5 days/week) and OT (1-2 hrs/day, 5 days/week) to address physical and functional deficits in the context of the above medical diagnosis(es)? Yes Addressing deficits in the following areas: balance, endurance, locomotion, strength, transferring, bowel/bladder control, bathing, dressing, feeding, grooming and toileting 5. Can the patient actively participate in an intensive therapy program of at least 3 hrs of therapy per day at least 5 days per week? not currently but should be able to do so in 3-4 days 6. The potential for patient to make measurable gains while on inpatient rehab is currently poor but should  be good potential once he is able to tolerate full program 7. Anticipated functional outcomes upon discharge from inpatient rehab are min assist, mod assist and wheelchair level  with PT, min assist, mod assist and wheelchair level with OT, n/a with SLP. 8. Estimated rehab length of stay to reach the above functional goals is: 22-28 days 9. Does the patient have adequate social supports and living environment to accommodate these discharge functional goals? Potentially 10. Anticipated D/C setting: Home 11. Anticipated post D/C treatments: HH therapy 12. Overall Rehab/Functional Prognosis: good  RECOMMENDATIONS: This patient's condition is appropriate for continued rehabilitative care in the following setting: CIR Patient has agreed to participate in recommended program. Potentially Note that insurance prior authorization may be required for reimbursement for  recommended care.  Comment: Patient currently not able to tolerate or fully participate in inpatient regurgitation. Will monitor progress.    11/15/2014

## 2014-11-13 NOTE — Progress Notes (Signed)
Thank you for consult on Carl [name]. Chart reviewed and patient set for surgery tomorrow. Will follow at a distance and await PT/OT evaluations to help determine appropriate rehab venue.

## 2014-11-14 ENCOUNTER — Encounter (HOSPITAL_COMMUNITY): Admission: EM | Disposition: A | Discharge status: Rehabilitation Facility | Payer: Self-pay | Source: Home / Self Care

## 2014-11-14 ENCOUNTER — Inpatient Hospital Stay (HOSPITAL_COMMUNITY): Payer: Federal, State, Local not specified - PPO | Admitting: Anesthesiology

## 2014-11-14 ENCOUNTER — Inpatient Hospital Stay (HOSPITAL_COMMUNITY): Payer: Federal, State, Local not specified - PPO

## 2014-11-14 ENCOUNTER — Encounter (HOSPITAL_COMMUNITY): Payer: Self-pay | Admitting: Anesthesiology

## 2014-11-14 DIAGNOSIS — G8250 Quadriplegia, unspecified: Secondary | ICD-10-CM | POA: Diagnosis present

## 2014-11-14 HISTORY — PX: ANTERIOR CERVICAL DECOMP/DISCECTOMY FUSION: SHX1161

## 2014-11-14 LAB — CBC
HEMATOCRIT: 31.2 % — AB (ref 39.0–52.0)
Hemoglobin: 10.8 g/dL — ABNORMAL LOW (ref 13.0–17.0)
MCH: 30.4 pg (ref 26.0–34.0)
MCHC: 34.6 g/dL (ref 30.0–36.0)
MCV: 87.9 fL (ref 78.0–100.0)
PLATELETS: 147 10*3/uL — AB (ref 150–400)
RBC: 3.55 MIL/uL — ABNORMAL LOW (ref 4.22–5.81)
RDW: 12.4 % (ref 11.5–15.5)
WBC: 7.8 10*3/uL (ref 4.0–10.5)

## 2014-11-14 LAB — BASIC METABOLIC PANEL
Anion gap: 5 (ref 5–15)
BUN: 16 mg/dL (ref 6–20)
CALCIUM: 8.3 mg/dL — AB (ref 8.9–10.3)
CO2: 31 mmol/L (ref 22–32)
CREATININE: 1.05 mg/dL (ref 0.61–1.24)
Chloride: 100 mmol/L — ABNORMAL LOW (ref 101–111)
GFR calc Af Amer: >60 mL/min (ref >60)
GLUCOSE: 126 mg/dL — AB (ref 65–99)
Potassium: 4.1 mmol/L (ref 3.5–5.1)
Sodium: 136 mmol/L (ref 135–145)

## 2014-11-14 SURGERY — ANTERIOR CERVICAL DECOMPRESSION/DISCECTOMY FUSION 1 LEVEL
Anesthesia: General | Site: Spine Cervical

## 2014-11-14 MED ORDER — HEMOSTATIC AGENTS (NO CHARGE) OPTIME
TOPICAL | Status: DC | PRN
Start: 2014-11-14 — End: 2014-11-14
  Administered 2014-11-14: 1 via TOPICAL

## 2014-11-14 MED ORDER — FENTANYL CITRATE (PF) 250 MCG/5ML IJ SOLN
INTRAMUSCULAR | Status: AC
Start: 2014-11-14 — End: 2014-11-14
  Filled 2014-11-14: qty 5

## 2014-11-14 MED ORDER — HYDROCODONE-ACETAMINOPHEN 5-325 MG PO TABS: 1.0000 | ORAL_TABLET | ORAL | Status: DC | PRN

## 2014-11-14 MED ORDER — KCL IN DEXTROSE-NACL 20-5-0.45 MEQ/L-%-% IV SOLN
INTRAVENOUS | Status: DC
  Administered 2014-11-15 (×2): via INTRAVENOUS
  Administered 2014-11-16 – 2014-11-17 (×2): 1000 mL via INTRAVENOUS
  Administered 2014-11-17 – 2014-11-19 (×2): via INTRAVENOUS
  Filled 2014-11-14 (×12): qty 1000

## 2014-11-14 MED ORDER — MEPERIDINE HCL 25 MG/ML IJ SOLN: 6.2500 mg | INTRAMUSCULAR | Status: DC | PRN

## 2014-11-14 MED ORDER — METHOCARBAMOL 500 MG PO TABS
500.0000 mg | ORAL_TABLET | Freq: Four times a day (QID) | ORAL | Status: DC | PRN
  Administered 2014-11-14: 500 mg via ORAL
  Filled 2014-11-14 (×2): qty 1

## 2014-11-14 MED ORDER — BUPIVACAINE HCL (PF) 0.5 % IJ SOLN
INTRAMUSCULAR | Status: DC | PRN
  Administered 2014-11-14: 9 mL

## 2014-11-14 MED ORDER — SODIUM CHLORIDE 0.9 % IJ SOLN
3.0000 mL | Freq: Two times a day (BID) | INTRAMUSCULAR | Status: DC
  Administered 2014-11-17 – 2014-11-20 (×3): 3 mL via INTRAVENOUS

## 2014-11-14 MED ORDER — DEXAMETHASONE SODIUM PHOSPHATE 10 MG/ML IJ SOLN
INTRAMUSCULAR | Status: DC | PRN
  Administered 2014-11-14: 10 mg via INTRAVENOUS

## 2014-11-14 MED ORDER — ACETAMINOPHEN 650 MG RE SUPP: 650.0000 mg | RECTAL | Status: DC | PRN

## 2014-11-14 MED ORDER — SODIUM CHLORIDE 0.9 % IV SOLN
10.0000 mg | INTRAVENOUS | Status: DC | PRN
  Administered 2014-11-14: 25 ug/min via INTRAVENOUS

## 2014-11-14 MED ORDER — ARTIFICIAL TEARS OP OINT
TOPICAL_OINTMENT | OPHTHALMIC | Status: DC | PRN
  Administered 2014-11-14: 1 via OPHTHALMIC

## 2014-11-14 MED ORDER — METHOCARBAMOL 1000 MG/10ML IJ SOLN
500.0000 mg | Freq: Four times a day (QID) | INTRAVENOUS | Status: DC | PRN
  Filled 2014-11-14: qty 5

## 2014-11-14 MED ORDER — OXYCODONE-ACETAMINOPHEN 5-325 MG PO TABS
1.0000 | ORAL_TABLET | ORAL | Status: DC | PRN
  Administered 2014-11-15: 1 via ORAL
  Filled 2014-11-14 (×4): qty 2

## 2014-11-14 MED ORDER — LACTATED RINGERS IV SOLN
INTRAVENOUS | Status: DC | PRN
  Administered 2014-11-14: 12:00:00 via INTRAVENOUS

## 2014-11-14 MED ORDER — HYDROMORPHONE HCL 1 MG/ML IJ SOLN
INTRAMUSCULAR | Status: AC
  Filled 2014-11-14: qty 1

## 2014-11-14 MED ORDER — SODIUM CHLORIDE 0.9 % IV SOLN
Freq: Once | INTRAVENOUS | Status: AC
  Administered 2014-11-14: 15:00:00 via INTRAVENOUS

## 2014-11-14 MED ORDER — NEOSTIGMINE METHYLSULFATE 10 MG/10ML IV SOLN
INTRAVENOUS | Status: DC | PRN
  Administered 2014-11-14: 4 mg via INTRAVENOUS

## 2014-11-14 MED ORDER — ALBUMIN HUMAN 5 % IV SOLN
INTRAVENOUS | Status: AC
  Filled 2014-11-14: qty 250

## 2014-11-14 MED ORDER — POLYETHYLENE GLYCOL 3350 17 G PO PACK
17.0000 g | PACK | Freq: Every day | ORAL | Status: DC | PRN
  Filled 2014-11-14: qty 1

## 2014-11-14 MED ORDER — ONDANSETRON HCL 4 MG/2ML IJ SOLN: 4.0000 mg | INTRAMUSCULAR | Status: DC | PRN

## 2014-11-14 MED ORDER — ROCURONIUM BROMIDE 100 MG/10ML IV SOLN
INTRAVENOUS | Status: DC | PRN
  Administered 2014-11-14: 50 mg via INTRAVENOUS

## 2014-11-14 MED ORDER — GLYCOPYRROLATE 0.2 MG/ML IJ SOLN
INTRAMUSCULAR | Status: DC | PRN
  Administered 2014-11-14: 0.2 mg via INTRAVENOUS
  Administered 2014-11-14: 0.6 mg via INTRAVENOUS

## 2014-11-14 MED ORDER — LIDOCAINE-EPINEPHRINE 1 %-1:100000 IJ SOLN
INTRAMUSCULAR | Status: DC | PRN
  Administered 2014-11-14: 9 mL

## 2014-11-14 MED ORDER — LIDOCAINE HCL (CARDIAC) 20 MG/ML IV SOLN
INTRAVENOUS | Status: DC | PRN
  Administered 2014-11-14: 100 mg via INTRAVENOUS

## 2014-11-14 MED ORDER — PHENYLEPHRINE HCL 10 MG/ML IJ SOLN
INTRAMUSCULAR | Status: AC
  Filled 2014-11-14: qty 1

## 2014-11-14 MED ORDER — ASPIRIN EC 81 MG PO TBEC
81.0000 mg | DELAYED_RELEASE_TABLET | Freq: Every day | ORAL | Status: DC
  Administered 2014-11-14 – 2014-11-20 (×7): 81 mg via ORAL
  Filled 2014-11-14 (×7): qty 1

## 2014-11-14 MED ORDER — SODIUM CHLORIDE 0.9 % IJ SOLN: 3.0000 mL | INTRAMUSCULAR | Status: DC | PRN

## 2014-11-14 MED ORDER — GLYCOPYRROLATE 0.2 MG/ML IJ SOLN
INTRAMUSCULAR | Status: AC
  Filled 2014-11-14: qty 5

## 2014-11-14 MED ORDER — DEXAMETHASONE SODIUM PHOSPHATE 10 MG/ML IJ SOLN
INTRAMUSCULAR | Status: AC
  Filled 2014-11-14: qty 2

## 2014-11-14 MED ORDER — VANCOMYCIN HCL IN DEXTROSE 1-5 GM/200ML-% IV SOLN
1000.0000 mg | Freq: Once | INTRAVENOUS | Status: AC
  Administered 2014-11-14: 1000 mg via INTRAVENOUS
  Filled 2014-11-14: qty 200

## 2014-11-14 MED ORDER — FENTANYL CITRATE (PF) 100 MCG/2ML IJ SOLN
INTRAMUSCULAR | Status: DC | PRN
  Administered 2014-11-14 (×2): 50 ug via INTRAVENOUS
  Administered 2014-11-14: 200 ug via INTRAVENOUS

## 2014-11-14 MED ORDER — DOCUSATE SODIUM 100 MG PO CAPS
100.0000 mg | ORAL_CAPSULE | Freq: Two times a day (BID) | ORAL | Status: DC
  Administered 2014-11-14 – 2014-11-20 (×10): 100 mg via ORAL
  Filled 2014-11-14 (×12): qty 1

## 2014-11-14 MED ORDER — MENTHOL 3 MG MT LOZG: 1.0000 | LOZENGE | OROMUCOSAL | Status: DC | PRN

## 2014-11-14 MED ORDER — HYDROMORPHONE HCL 1 MG/ML IJ SOLN
0.2500 mg | INTRAMUSCULAR | Status: DC | PRN
  Administered 2014-11-14: 0.5 mg via INTRAVENOUS
  Administered 2014-11-14: 0.25 mg via INTRAVENOUS
  Administered 2014-11-14: 0.5 mg via INTRAVENOUS
  Administered 2014-11-14: 0.25 mg via INTRAVENOUS
  Administered 2014-11-14: 0.5 mg via INTRAVENOUS

## 2014-11-14 MED ORDER — VANCOMYCIN HCL 1000 MG IV SOLR
1000.0000 mg | INTRAVENOUS | Status: DC | PRN
  Administered 2014-11-14: 1000 mg via INTRAVENOUS

## 2014-11-14 MED ORDER — ONDANSETRON HCL 4 MG/2ML IJ SOLN: 4.0000 mg | Freq: Once | INTRAMUSCULAR | Status: DC | PRN

## 2014-11-14 MED ORDER — FENTANYL CITRATE (PF) 250 MCG/5ML IJ SOLN
INTRAMUSCULAR | Status: AC
  Filled 2014-11-14: qty 5

## 2014-11-14 MED ORDER — THROMBIN 5000 UNITS EX SOLR
OROMUCOSAL | Status: DC | PRN
  Administered 2014-11-14: 14:00:00 via TOPICAL

## 2014-11-14 MED ORDER — 0.9 % SODIUM CHLORIDE (POUR BTL) OPTIME
TOPICAL | Status: DC | PRN
  Administered 2014-11-14: 1000 mL

## 2014-11-14 MED ORDER — PROPOFOL 10 MG/ML IV BOLUS
INTRAVENOUS | Status: AC
  Filled 2014-11-14: qty 20

## 2014-11-14 MED ORDER — MIDAZOLAM HCL 5 MG/5ML IJ SOLN
INTRAMUSCULAR | Status: DC | PRN
  Administered 2014-11-14: 2 mg via INTRAVENOUS

## 2014-11-14 MED ORDER — MIDAZOLAM HCL 2 MG/2ML IJ SOLN
INTRAMUSCULAR | Status: AC
  Filled 2014-11-14: qty 4

## 2014-11-14 MED ORDER — PROPOFOL 10 MG/ML IV BOLUS
INTRAVENOUS | Status: DC | PRN
  Administered 2014-11-14: 110 mg via INTRAVENOUS

## 2014-11-14 MED ORDER — BISACODYL 10 MG RE SUPP: 10.0000 mg | Freq: Every day | RECTAL | Status: DC | PRN

## 2014-11-14 MED ORDER — VANCOMYCIN HCL IN DEXTROSE 1-5 GM/200ML-% IV SOLN
INTRAVENOUS | Status: AC
  Filled 2014-11-14: qty 200

## 2014-11-14 MED ORDER — HYDROMORPHONE HCL 1 MG/ML IJ SOLN
0.5000 mg | INTRAMUSCULAR | Status: DC | PRN
  Administered 2014-11-14 – 2014-11-15 (×3): 1 mg via INTRAVENOUS
  Administered 2014-11-15: 0.5 mg via INTRAVENOUS
  Filled 2014-11-14: qty 1

## 2014-11-14 MED ORDER — ONDANSETRON HCL 4 MG/2ML IJ SOLN
INTRAMUSCULAR | Status: DC | PRN
  Administered 2014-11-14: 4 mg via INTRAVENOUS

## 2014-11-14 MED ORDER — FLEET ENEMA 7-19 GM/118ML RE ENEM
1.0000 | ENEMA | Freq: Once | RECTAL | Status: DC | PRN
  Filled 2014-11-14 (×2): qty 1

## 2014-11-14 MED ORDER — PHENOL 1.4 % MT LIQD
1.0000 | OROMUCOSAL | Status: DC | PRN
Start: 2014-11-14 — End: 2014-11-20
  Administered 2014-11-17 (×2): 1 via OROMUCOSAL
  Filled 2014-11-14: qty 177

## 2014-11-14 MED ORDER — NEOSTIGMINE METHYLSULFATE 10 MG/10ML IV SOLN
INTRAVENOUS | Status: AC
  Filled 2014-11-14: qty 1

## 2014-11-14 MED ORDER — THROMBIN 5000 UNITS EX SOLR
CUTANEOUS | Status: DC | PRN
  Administered 2014-11-14 (×2): 5000 [IU] via TOPICAL

## 2014-11-14 MED ORDER — ALUM & MAG HYDROXIDE-SIMETH 200-200-20 MG/5ML PO SUSP
30.0000 mL | Freq: Four times a day (QID) | ORAL | Status: DC | PRN
Start: 2014-11-14 — End: 2014-11-20

## 2014-11-14 MED ORDER — ACETAMINOPHEN 325 MG PO TABS
650.0000 mg | ORAL_TABLET | ORAL | Status: DC | PRN
  Administered 2014-11-19: 650 mg via ORAL
  Filled 2014-11-14: qty 2

## 2014-11-14 MED ORDER — EPHEDRINE SULFATE 50 MG/ML IJ SOLN
INTRAMUSCULAR | Status: DC | PRN
  Administered 2014-11-14: 10 mg via INTRAVENOUS

## 2014-11-14 MED ORDER — PANTOPRAZOLE SODIUM 40 MG IV SOLR: 40.0000 mg | Freq: Every day | INTRAVENOUS | Status: DC

## 2014-11-14 MED ORDER — ALBUMIN HUMAN 5 % IV SOLN
12.5000 g | Freq: Once | INTRAVENOUS | Status: AC
  Administered 2014-11-14: 12.5 g via INTRAVENOUS

## 2014-11-14 MED ORDER — SODIUM CHLORIDE 0.9 % IV SOLN: 250.0000 mL | INTRAVENOUS | Status: DC

## 2014-11-14 SURGICAL SUPPLY — 68 items
BENZOIN TINCTURE PRP APPL 2/3 (GAUZE/BANDAGES/DRESSINGS) IMPLANT
BIT DRILL NEURO 2X3.1 SFT TUCH (MISCELLANEOUS) ×1 IMPLANT
BIT DRILL POWER (BIT) ×1 IMPLANT
BLADE ULTRA TIP 2M (BLADE) IMPLANT
BNDG GAUZE ELAST 4 BULKY (GAUZE/BANDAGES/DRESSINGS) IMPLANT
BUR BARREL STRAIGHT FLUTE 4.0 (BURR) ×3 IMPLANT
CAGE LORDOTIC 8 SM PLUS (Cage) ×3 IMPLANT
CANISTER SUCT 3000ML PPV (MISCELLANEOUS) ×3 IMPLANT
CLOSURE WOUND 1/2 X4 (GAUZE/BANDAGES/DRESSINGS)
COVER MAYO STAND STRL (DRAPES) ×3 IMPLANT
DECANTER SPIKE VIAL GLASS SM (MISCELLANEOUS) ×3 IMPLANT
DERMABOND ADVANCED (GAUZE/BANDAGES/DRESSINGS) ×2
DERMABOND ADVANCED .7 DNX12 (GAUZE/BANDAGES/DRESSINGS) ×1 IMPLANT
DRAPE LAPAROTOMY 100X72 PEDS (DRAPES) ×3 IMPLANT
DRAPE MICROSCOPE LEICA (MISCELLANEOUS) ×3 IMPLANT
DRAPE POUCH INSTRU U-SHP 10X18 (DRAPES) ×3 IMPLANT
DRAPE PROXIMA HALF (DRAPES) IMPLANT
DRILL BIT POWER (BIT) ×2
DRILL NEURO 2X3.1 SOFT TOUCH (MISCELLANEOUS) ×3
DRSG OPSITE POSTOP 3X4 (GAUZE/BANDAGES/DRESSINGS) ×3 IMPLANT
DURAPREP 6ML APPLICATOR 50/CS (WOUND CARE) ×3 IMPLANT
ELECT COATED BLADE 2.86 ST (ELECTRODE) ×3 IMPLANT
ELECT REM PT RETURN 9FT ADLT (ELECTROSURGICAL) ×3
ELECTRODE REM PT RTRN 9FT ADLT (ELECTROSURGICAL) ×1 IMPLANT
GAUZE SPONGE 4X4 12PLY STRL (GAUZE/BANDAGES/DRESSINGS) IMPLANT
GAUZE SPONGE 4X4 16PLY XRAY LF (GAUZE/BANDAGES/DRESSINGS) IMPLANT
GLOVE BIO SURGEON STRL SZ8 (GLOVE) ×3 IMPLANT
GLOVE BIOGEL PI IND STRL 8 (GLOVE) ×1 IMPLANT
GLOVE BIOGEL PI IND STRL 8.5 (GLOVE) ×1 IMPLANT
GLOVE BIOGEL PI INDICATOR 8 (GLOVE) ×2
GLOVE BIOGEL PI INDICATOR 8.5 (GLOVE) ×2
GLOVE ECLIPSE 8.0 STRL XLNG CF (GLOVE) ×3 IMPLANT
GLOVE EXAM NITRILE LRG STRL (GLOVE) IMPLANT
GLOVE EXAM NITRILE MD LF STRL (GLOVE) IMPLANT
GLOVE EXAM NITRILE XL STR (GLOVE) IMPLANT
GLOVE EXAM NITRILE XS STR PU (GLOVE) IMPLANT
GOWN STRL REUS W/ TWL LRG LVL3 (GOWN DISPOSABLE) IMPLANT
GOWN STRL REUS W/ TWL XL LVL3 (GOWN DISPOSABLE) IMPLANT
GOWN STRL REUS W/TWL 2XL LVL3 (GOWN DISPOSABLE) IMPLANT
GOWN STRL REUS W/TWL LRG LVL3 (GOWN DISPOSABLE)
GOWN STRL REUS W/TWL XL LVL3 (GOWN DISPOSABLE)
HALTER HD/CHIN CERV TRACTION D (MISCELLANEOUS) ×3 IMPLANT
HEMOSTAT POWDER SURGIFOAM 1G (HEMOSTASIS) ×3 IMPLANT
KIT BASIN OR (CUSTOM PROCEDURE TRAY) ×3 IMPLANT
KIT ROOM TURNOVER OR (KITS) ×3 IMPLANT
NEEDLE HYPO 18GX1.5 BLUNT FILL (NEEDLE) IMPLANT
NEEDLE HYPO 25X1 1.5 SAFETY (NEEDLE) ×3 IMPLANT
NEEDLE SPNL 22GX3.5 QUINCKE BK (NEEDLE) ×3 IMPLANT
NS IRRIG 1000ML POUR BTL (IV SOLUTION) ×3 IMPLANT
PACK LAMINECTOMY NEURO (CUSTOM PROCEDURE TRAY) ×3 IMPLANT
PAD ARMBOARD 7.5X6 YLW CONV (MISCELLANEOUS) ×9 IMPLANT
PIN DISTRACTION 14MM (PIN) ×6 IMPLANT
PLATE ARCHON 24MM 1LVL (Plate) ×3 IMPLANT
PUTTY DBX 1CC (Putty) ×3 IMPLANT
PUTTY DBX 1CC DEPUY (Putty) ×1 IMPLANT
RUBBERBAND STERILE (MISCELLANEOUS) ×6 IMPLANT
SCREW ARCHON SELFTAP 4.0X13MM (Screw) ×12 IMPLANT
SPONGE INTESTINAL PEANUT (DISPOSABLE) ×3 IMPLANT
SPONGE SURGIFOAM ABS GEL SZ50 (HEMOSTASIS) ×3 IMPLANT
STAPLER SKIN PROX WIDE 3.9 (STAPLE) IMPLANT
STRIP CLOSURE SKIN 1/2X4 (GAUZE/BANDAGES/DRESSINGS) IMPLANT
SUT VIC AB 3-0 SH 8-18 (SUTURE) ×6 IMPLANT
SYR 20ML ECCENTRIC (SYRINGE) ×3 IMPLANT
SYR 3ML LL SCALE MARK (SYRINGE) IMPLANT
TOWEL OR 17X24 6PK STRL BLUE (TOWEL DISPOSABLE) ×3 IMPLANT
TOWEL OR 17X26 10 PK STRL BLUE (TOWEL DISPOSABLE) ×3 IMPLANT
TRAP SPECIMEN MUCOUS 40CC (MISCELLANEOUS) ×3 IMPLANT
WATER STERILE IRR 1000ML POUR (IV SOLUTION) ×3 IMPLANT

## 2014-11-14 NOTE — Transfer of Care (Signed)
Immediate Anesthesia Transfer of Care Note  Patient: Carl [name]  Procedure(s) Performed: Procedure(s): Cervical Five-Cervical Six  Anterior Cervical Decompression/Diskectomy/Fusion (N/A)  Patient Location: PACU  Anesthesia Type:General  Level of Consciousness: awake, alert  and oriented  Airway & Oxygen Therapy: Patient Spontanous Breathing and Patient connected to face mask oxygen  Post-op Assessment: Report given to RN and Post -op Vital signs reviewed and stable  Post vital signs: Reviewed and stable  Last Vitals:  Filed Vitals:   11/14/14 1200  BP: 96/47  Pulse: 43  Temp: 37.6 C  Resp: 13    Complications: No apparent anesthesia complications

## 2014-11-14 NOTE — Progress Notes (Signed)
Pt with low BP, albumin given per MD, will give robaxin and send pt back to unit, BP up after albumin

## 2014-11-14 NOTE — Progress Notes (Signed)
Patient ID: Carl [name], male   DOB: Mar 31, 1951, 64 y.o.   MRN: [id number]  LOS: 5 days   Subjective: BP soft. Bradycardic.  Dopamine off now.  Labs are stable.  No acute events.    Objective: Vital signs in last 24 hours: Temp:  [98.3 F (36.8 C)-99.4 F (37.4 C)] 98.7 F (37.1 C) (08/09 0809) Pulse Rate:  [39-52] 40 (08/09 1000) Resp:  [10-17] 15 (08/09 1000) BP: (88-121)/(43-63) 94/47 mmHg (08/09 1000) SpO2:  [95 %-99 %] 97 % (08/09 1000)    Lab Results:  CBC  Recent Labs  11/13/14 0505 11/14/14 0615  WBC 8.3 7.8  HGB 11.0* 10.8*  HCT 31.5* 31.2*  PLT 140* 147*   BMET  Recent Labs  11/14/14 0615  NA 136  K 4.1  CL 100*  CO2 31  GLUCOSE 126*  BUN 16  CREATININE 1.05  CALCIUM 8.3*    Imaging: No results found.   PE: General appearance:  Neck:  Resp:  Cardio:  GI:  Extremities: Neurologic:     Patient Active Problem List   Diagnosis Date Noted  . C5 vertebral fracture 11/09/2014    Assessment/Plan: Fall C5-6 FXs and cord injury - for ACDF by Dr. Venetia Maxon today.  Having some neuropathic pain.  May need to start some lyrica if unresolved after fixation.   R facial lac Chronic bradycardia and acute neurogenic shock - dopamine off.  May need to be restarted.   FEN - NPO, IVF VTE - PAS Respiratory:  Currently maintaining spontaneous respirations.  Will see if able to extubate post op.   Dispo - ICU    11/14/2014

## 2014-11-14 NOTE — Anesthesia Procedure Notes (Signed)
Procedure Name: Intubation Date/Time: 11/14/2014 12:53 PM Performed by: Carmela Rima Pre-anesthesia Checklist: Patient being monitored, Suction available, Emergency Drugs available, Patient identified and Timeout performed Patient Re-evaluated:Patient Re-evaluated prior to inductionOxygen Delivery Method: Circle system utilized Preoxygenation: Pre-oxygenation with 100% oxygen Intubation Type: IV induction Ventilation: Mask ventilation without difficulty and Oral airway inserted - appropriate to patient size Laryngoscope Size: Glidescope and 3 Grade View: Grade I Tube type: Oral Tube size: 7.5 mm Number of attempts: 1 Placement Confirmation: ETT inserted through vocal cords under direct vision,  positive ETCO2 and breath sounds checked- equal and bilateral Secured at: 23 cm Tube secured with: Tape Dental Injury: Teeth and Oropharynx as per pre-operative assessment

## 2014-11-14 NOTE — Anesthesia Postprocedure Evaluation (Signed)
Anesthesia Post Note  Patient: Carl [name]  Procedure(s) Performed: Procedure(s) (LRB): Cervical Five-Cervical Six  Anterior Cervical Decompression/Diskectomy/Fusion (N/A)  Anesthesia type: general  Patient location: PACU  Post pain: Pain level controlled  Post assessment: Patient's Cardiovascular Status Stable  Last Vitals:  Filed Vitals:   11/14/14 1445  BP: 82/46  Pulse: 59  Temp:   Resp: 16    Post vital signs: Reviewed and stable  Level of consciousness: sedated  Complications: No apparent anesthesia complications

## 2014-11-14 NOTE — Progress Notes (Signed)
Awake, alert, conversant.  Exam stable from preop with full biceps, triceps, minimal hand usage, left greater than right, currently minimal right foot movement.  Able to adduct both legs.  No reported change in sensation.  Stable.  Pain well controlled.

## 2014-11-14 NOTE — Op Note (Signed)
11/09/2014 - 11/14/2014  2:17 PM  PATIENT:  Carl [name]  64 y.o. male  PRE-OPERATIVE DIAGNOSIS:  Cervical spine injury with quadriparesis.  C 5 facet fracture and spinal stenosis with intra-cord hematoma  POST-OPERATIVE DIAGNOSIS:   Cervical spine injury with quadriparesis.  C 5 facet fracture and spinal stenosis with intra-cord hematoma  PROCEDURE:  Procedure(s): Cervical Five-Cervical Six  Anterior Cervical Decompression/Diskectomy/Fusion (N/A) with PEEK cage, autograft, allograft, plate.  SURGEON:  Surgeon(s) and Role:    * Maeola Harman, MD - Primary    * Aliene Beams, MD - Assisting  PHYSICIAN ASSISTANT:   ASSISTANTS: Poteat, RN   ANESTHESIA:   general  EBL:  Total I/O In: 1435 [I.V.:1435] Out: 825 [Urine:725; Blood:100]  BLOOD ADMINISTERED:none  DRAINS: none   LOCAL MEDICATIONS USED:  LIDOCAINE   SPECIMEN:  No Specimen  DISPOSITION OF SPECIMEN:  N/A  COUNTS:  YES  TOURNIQUET:  * No tourniquets in log *  DICTATION: Patient is 64 year old male with quadriparesis after a fall from a roof.  He was found to have C 5 facet fractures and disruption of C 56 disc with cord hematoma.  It was elected to take him to surgery for anterior cervical decompression and fusion C  56 level.  PROCEDURE: Patient was brought to operating room and following the smooth and uncomplicated induction of general endotracheal anesthesia with Glide scope while maintained in cervical collar his head was placed on a horseshoe head holder he was placed in 5 pounds of Holter traction and his anterior neck was prepped and draped in usual sterile fashion. An incision was made on the left side of midline after infiltrating the skin and subcutaneous tissues with local lidocaine. The platysmal layer was incised and subplatysmal dissection was performed exposing the anterior border sternocleidomastoid muscle. Using blunt dissection the carotid sheath was kept lateral and trachea and esophagus kept medial  exposing the anterior cervical spine. A bent spinal needle was placed it was felt to be the C 45 level and this was confirmed on intraoperative x-ray. There was a hematoma in the C 56 disc with disruption of ALL and CSF leaking from disc space.  Longus coli muscles were taken down from the anterior cervical spine using electrocautery and key elevator and self-retaining retractor was placed exposing the C 56 level. The interspace was incised and a thorough discectomy was performed. The disc was completely disrupted. Distraction pins were placed. Uncinate spurs and central spondylitic ridges were drilled down with a high-speed drill. The spinal canal was widely decompressed. Hemostasis was assured and CSF leakage was controlled with surgifoam. After trial sizing an 8 mm lordotic PEEK cage was selected and packed with local autograft and DBM. This was tamped into position and countersunk appropriately. Distraction weight was removed. A 24 mm Nuvasive Archon anterior cervical plate was affixed to the cervical spine with 13 mm fixed-angle screws 2 at C5, 2 at C6. All screws were well-positioned and locking mechanisms were engaged. A final X ray was obtained which showed well positioned graft and anterior plate without complicating features. Soft tissues were inspected and found to be in good repair. The wound was irrigated. The platysma layer was closed with 3-0 Vicryl stitches and the skin was reapproximated with 3-0 Vicryl subcuticular stitches. The wound was dressed with Dermabond and occlusive dressing. Counts were correct at the end of the case. Patient was extubated and taken to recovery in stable and satisfactory condition.   PLAN OF CARE: Admit to inpatient  PATIENT DISPOSITION:  PACU - hemodynamically stable.   Delay start of Pharmacological VTE agent (>24hrs) due to surgical blood loss or risk of bleeding: yes

## 2014-11-14 NOTE — Progress Notes (Signed)
Subjective: Patient reports no change  Objective: Vital signs in last 24 hours: Temp:  [98.3 F (36.8 C)-99.4 F (37.4 C)] 98.3 F (36.8 C) (08/09 0400) Pulse Rate:  [40-52] 45 (08/09 0700) Resp:  [10-17] 15 (08/09 0700) BP: (91-121)/(43-63) 97/49 mmHg (08/09 0700) SpO2:  [89 %-99 %] 97 % (08/09 0700)  Intake/Output from previous day: 08/08 0701 - 08/09 0700 In: 3025 [I.V.:3025] Out: 3870 [Urine:3870] Intake/Output this shift:    Physical Exam: Motor exam stable  Lab Results:  Recent Labs  11/13/14 0505 11/14/14 0615  WBC 8.3 7.8  HGB 11.0* 10.8*  HCT 31.5* 31.2*  PLT 140* 147*   BMET  Recent Labs  11/14/14 0615  NA 136  K 4.1  CL 100*  CO2 31  GLUCOSE 126*  BUN 16  CREATININE 1.05  CALCIUM 8.3*    Studies/Results: No results found.  Assessment/Plan: ACDF C 56 today.      LOS: 5 days    Dorian Heckle, MD 11/14/2014, 7:29 AM

## 2014-11-14 NOTE — Brief Op Note (Signed)
11/09/2014 - 11/14/2014  2:17 PM  PATIENT:  Carl [name]  64 y.o. male  PRE-OPERATIVE DIAGNOSIS:  Cervical spine injury with quadriparesis.  C 5 facet fracture and spinal stenosis with intra-cord hematoma  POST-OPERATIVE DIAGNOSIS:   Cervical spine injury with quadriparesis.  C 5 facet fracture and spinal stenosis with intra-cord hematoma  PROCEDURE:  Procedure(s): Cervical Five-Cervical Six  Anterior Cervical Decompression/Diskectomy/Fusion (N/A) with PEEK cage, autograft, allograft, plate.  SURGEON:  Surgeon(s) and Role:    * Brucha Ahlquist, MD - Primary    * Randy Kritzer, MD - Assisting  PHYSICIAN ASSISTANT:   ASSISTANTS: Poteat, RN   ANESTHESIA:   general  EBL:  Total I/O In: 1435 [I.V.:1435] Out: 825 [Urine:725; Blood:100]  BLOOD ADMINISTERED:none  DRAINS: none   LOCAL MEDICATIONS USED:  LIDOCAINE   SPECIMEN:  No Specimen  DISPOSITION OF SPECIMEN:  N/A  COUNTS:  YES  TOURNIQUET:  * No tourniquets in log *  DICTATION: Patient is 64 year old male with quadriparesis after a fall from a roof.  He was found to have C 5 facet fractures and disruption of C 56 disc with cord hematoma.  It was elected to take him to surgery for anterior cervical decompression and fusion C  56 level.  PROCEDURE: Patient was brought to operating room and following the smooth and uncomplicated induction of general endotracheal anesthesia with Glide scope while maintained in cervical collar his head was placed on a horseshoe head holder he was placed in 5 pounds of Holter traction and his anterior neck was prepped and draped in usual sterile fashion. An incision was made on the left side of midline after infiltrating the skin and subcutaneous tissues with local lidocaine. The platysmal layer was incised and subplatysmal dissection was performed exposing the anterior border sternocleidomastoid muscle. Using blunt dissection the carotid sheath was kept lateral and trachea and esophagus kept medial  exposing the anterior cervical spine. A bent spinal needle was placed it was felt to be the C 45 level and this was confirmed on intraoperative x-ray. There was a hematoma in the C 56 disc with disruption of ALL and CSF leaking from disc space.  Longus coli muscles were taken down from the anterior cervical spine using electrocautery and key elevator and self-retaining retractor was placed exposing the C 56 level. The interspace was incised and a thorough discectomy was performed. The disc was completely disrupted. Distraction pins were placed. Uncinate spurs and central spondylitic ridges were drilled down with a high-speed drill. The spinal canal was widely decompressed. Hemostasis was assured and CSF leakage was controlled with surgifoam. After trial sizing an 8 mm lordotic PEEK cage was selected and packed with local autograft and DBM. This was tamped into position and countersunk appropriately. Distraction weight was removed. A 24 mm Nuvasive Archon anterior cervical plate was affixed to the cervical spine with 13 mm fixed-angle screws 2 at C5, 2 at C6. All screws were well-positioned and locking mechanisms were engaged. A final X ray was obtained which showed well positioned graft and anterior plate without complicating features. Soft tissues were inspected and found to be in good repair. The wound was irrigated. The platysma layer was closed with 3-0 Vicryl stitches and the skin was reapproximated with 3-0 Vicryl subcuticular stitches. The wound was dressed with Dermabond and occlusive dressing. Counts were correct at the end of the case. Patient was extubated and taken to recovery in stable and satisfactory condition.   PLAN OF CARE: Admit to inpatient     PATIENT DISPOSITION:  PACU - hemodynamically stable.   Delay start of Pharmacological VTE agent (>24hrs) due to surgical blood loss or risk of bleeding: yes

## 2014-11-14 NOTE — Anesthesia Preprocedure Evaluation (Signed)
Anesthesia Evaluation  Patient identified by MRN, date of birth, ID band Patient awake    Reviewed: Allergy & Precautions, NPO status , Patient's Chart, lab work & pertinent test results  Airway Mallampati: II  TM Distance: >3 FB Neck ROM: Full    Dental   Pulmonary    Pulmonary exam normal       Cardiovascular Normal cardiovascular exam    Neuro/Psych Quadraparesis, will use glidescope.    GI/Hepatic   Endo/Other    Renal/GU      Musculoskeletal   Abdominal   Peds  Hematology   Anesthesia Other Findings   Reproductive/Obstetrics                             Anesthesia Physical Anesthesia Plan  ASA: III  Anesthesia Plan: General   Post-op Pain Management:    Induction: Intravenous  Airway Management Planned: Oral ETT and Video Laryngoscope Planned  Additional Equipment:   Intra-op Plan:   Post-operative Plan: Extubation in OR  Informed Consent: I have reviewed the patients History and Physical, chart, labs and discussed the procedure including the risks, benefits and alternatives for the proposed anesthesia with the patient or authorized representative who has indicated his/her understanding and acceptance.     Plan Discussed with: CRNA and Surgeon  Anesthesia Plan Comments:         Anesthesia Quick Evaluation

## 2014-11-14 NOTE — Care Management Note (Signed)
Case Management Note  Patient Details  Name: Dayne Chait MRN: [id number] Date of Birth: [birth year]  Subjective/Objective:   Pt admitted on 11/09/14 s/p fall from roof with C5 vertebral fracture.  To OR for ACDF today.  PTA, pt independent and living with spouse.                   Action/Plan: Will follow for discharge planning as pt progresses postoperatively.    Expected Discharge Date:                  Expected Discharge Plan:  IP Rehab Facility  In-House Referral:  Clinical Social Work  Discharge planning Services  CM Consult  Post Acute Care Choice:    Choice offered to:     DME Arranged:    DME Agency:     HH Arranged:    HH Agency:     Status of Service:  In process, will continue to follow  Medicare Important Message Given:    Date Medicare IM Given:    Medicare IM give by:    Date Additional Medicare IM Given:    Additional Medicare Important Message give by:     If discussed at Long Length of Stay Meetings, dates discussed:    Additional Comments:  Quintella Baton, RN, BSN  Trauma/Neuro ICU Case Manager [phone]

## 2014-11-15 ENCOUNTER — Encounter (HOSPITAL_COMMUNITY): Payer: Self-pay | Admitting: Physical Medicine and Rehabilitation

## 2014-11-15 DIAGNOSIS — G8254 Quadriplegia, C5-C7 incomplete: Secondary | ICD-10-CM

## 2014-11-15 DIAGNOSIS — G8252 Quadriplegia, C1-C4 incomplete: Secondary | ICD-10-CM

## 2014-11-15 DIAGNOSIS — S12400A Unspecified displaced fracture of fifth cervical vertebra, initial encounter for closed fracture: Secondary | ICD-10-CM

## 2014-11-15 MED ORDER — WHITE PETROLATUM GEL
Status: AC
  Filled 2014-11-15: qty 1

## 2014-11-15 MED ORDER — HYDROMORPHONE HCL 1 MG/ML IJ SOLN
0.5000 mg | INTRAMUSCULAR | Status: DC | PRN
  Administered 2014-11-17 – 2014-11-19 (×4): 0.5 mg via INTRAVENOUS
  Administered 2014-11-20: 1 mg via INTRAVENOUS
  Filled 2014-11-15 (×7): qty 1

## 2014-11-15 MED ORDER — OXYCODONE-ACETAMINOPHEN 5-325 MG PO TABS
1.0000 | ORAL_TABLET | ORAL | Status: DC | PRN
  Administered 2014-11-15: 2 via ORAL
  Administered 2014-11-15: 1 via ORAL
  Administered 2014-11-16 – 2014-11-17 (×5): 2 via ORAL
  Filled 2014-11-15 (×5): qty 2

## 2014-11-15 MED ORDER — TRAMADOL HCL 50 MG PO TABS
50.0000 mg | ORAL_TABLET | Freq: Four times a day (QID) | ORAL | Status: DC
  Administered 2014-11-15 – 2014-11-20 (×18): 50 mg via ORAL
  Filled 2014-11-15 (×18): qty 1

## 2014-11-15 NOTE — Clinical Social Work Note (Signed)
Clinical Social Work Assessment  Patient Details  Name: Carl [name] MRN: [id number] Date of Birth: 1950-10-16  Date of referral:  11/15/14               Reason for consult:  Trauma                Permission sought to share information with:  Family Supports Permission granted to share information::  Yes, Verbal Permission Granted  Name::     Carl [name]  Relationship::  Spouse  Contact Information:  [phone]  Housing/Transportation Living arrangements for the past 2 months:  [place] of Information:  Patient, Spouse Patient Interpreter Needed:  None Criminal Activity/Legal Involvement Pertinent to Current Situation/Hospitalization:  No - Comment as needed Significant Relationships:  Adult Children, Spouse Lives with:  Spouse Do you feel safe going back to the place where you live?  Yes Need for family participation in patient care:  Yes (Comment)  Care giving concerns:  Patient wife states that they have been together for almost 63 years and patient is extremely independent.  Patient wife is not currently working and plans to provide the necessary support and assistance to patient as needed at discharge.  Patient spouse does express that there is adequate family in the area to also assist.   Social Worker assessment / plan:  Holiday representative met with patient and spouse at bedside to offer support and discuss patient plans at discharge.  Patient states that he was painting his own home when he fell about 30 feet hitting the ground.  Patient remembers the fall but does not express concerns regarding nightmares and/or flashbacks.  Patient lives at home with his wife and daughter and her family live across the street.  Patient lives in a two story home, however the master bedroom is on the first floor.  There are about 5 steps to get into the house but otherwise no other steps once inside.  Patient family is committed to providing adequate support to patient  following discharge.  Patient plans to go to inpatient rehab and hopefully home with family.  CSW will remain involved for support and to assist with discharge planning needs if deemed appropriate.  Patient does not express any concerns regarding current substance use.  SBIRT complete.  No resources needed at this time.  Employment status:  Therapist, music:  Managed Care PT Recommendations:  Inpatient Rehab Consult Information / Referral to community resources:  SBIRT  Patient/Family's Response to care:  Patient and patient family verbalized understanding of CSW role and appreciative of support and concern.  Patient and family agreeable with hopeful plan for inpatient rehab following inpatient stay.  Patient states "I'm praying for a miracle."  Patient/Family's Understanding of and Emotional Response to Diagnosis, Current Treatment, and Prognosis:  Patient and spouse very aware of patient current deficits and are hopeful for full recovery.  Patient very depressed, however motivated for potential improvement.  Patient spouse supportive and hands on with patient at bedside.  Emotional Assessment Appearance:  Appears stated age Attitude/Demeanor/Rapport:  Guarded, Apprehensive, Grieving Affect (typically observed):  Hopeful, Depressed, Flat, Calm, Guarded Orientation:  Oriented to Self, Oriented to Place, Oriented to  Time, Oriented to Situation Alcohol / Substance use:  Never Used Psych involvement (Current and /or in the community):  Outpatient Provider  Discharge Needs  Concerns to be addressed:  Coping/Stress Concerns, Care Coordination, Adjustment to Illness Readmission within the last 30 days:  No Current discharge risk:  Dependent with Mobility Barriers to Discharge:  Continued Medical Work up  [employer], [place]

## 2014-11-15 NOTE — Progress Notes (Signed)
Trauma Service Note  Subjective: Patient seems depressed.  Very appropriate.  Not much of an appetite.  Objective: Vital signs in last 24 hours: Temp:  [97.5 F (36.4 C)-102.4 F (39.1 C)] 99.8 F (37.7 C) (08/10 0757) Pulse Rate:  [43-94] 53 (08/10 1000) Resp:  [11-38] 15 (08/10 1000) BP: (74-144)/(44-59) 144/48 mmHg (08/10 1000) SpO2:  [92 %-99 %] 92 % (08/10 1000)    Intake/Output from previous day: 08/09 0701 - 08/10 0700 In: 2410 [I.V.:2410] Out: 2400 [Urine:2300; Blood:100] Intake/Output this shift: Total I/O In: 225 [I.V.:225] Out: 235 [Urine:235]  General: Patient depressed. No bowel movement  Lungs: Clear, but still abdominal breathing.  Abd: Distended, good bowel sounds.  Extremities: No changes  Neuro: Very weak LE, Can move right arm a bit better than the left.  Lab Results: CBC   Recent Labs  11/13/14 0505 11/14/14 0615  WBC 8.3 7.8  HGB 11.0* 10.8*  HCT 31.5* 31.2*  PLT 140* 147*   BMET  Recent Labs  11/14/14 0615  NA 136  K 4.1  CL 100*  CO2 31  GLUCOSE 126*  BUN 16  CREATININE 1.05  CALCIUM 8.3*   PT/INR No results for input(s): LABPROT, INR in the last 72 hours. ABG No results for input(s): PHART, HCO3 in the last 72 hours.  Invalid input(s): PCO2, PO2  Studies/Results: Dg Cervical Spine 2-3 Views  11/14/2014   CLINICAL DATA:  Cervical spine fracture.  EXAM: CERVICAL SPINE - 2-3 VIEW  COMPARISON:  MRI and CT 8/4 and 11/10/2014.  FINDINGS: Intraoperative lateral localization radiographs are submitted for interpretation. Initial localization shows probe at C4-C5. Subsequent images demonstrate C5-C6 ACDF. Endotracheal tube and orogastric tube present. Surgical packing is present on the last image in the precervical soft tissues.  IMPRESSION: C5-C6 ACDF.   Electronically Signed   By: Andreas Newport M.D.   On: 11/14/2014 14:22    Anti-infectives: Anti-infectives    Start     Dose/Rate Route Frequency Ordered Stop   11/14/14 2300   vancomycin (VANCOCIN) IVPB 1000 mg/200 mL premix     1,000 mg 200 mL/hr over 60 Minutes Intravenous  Once 11/14/14 1712 11/14/14 2353   11/14/14 1228  vancomycin (VANCOCIN) 1 GM/200ML IVPB    Comments:  [name]   : cabinet override      11/14/14 1228 11/15/14 0044      Assessment/Plan: s/p Procedure(s): Cervical Five-Cervical Six  Anterior Cervical Decompression/Diskectomy/Fusion Advance diet Transfer to SDU  LOS: 6 days   [name]. Gae Bon, MD, FACS [phone] Trauma Surgeon 11/15/2014

## 2014-11-15 NOTE — Evaluation (Signed)
Physical Therapy Evaluation Patient Details Name: Carl [name] MRN: [id number] DOB: [birth year] Today's Date: 11/15/2014   History of Present Illness  Carl [name] was painting a house and was up on the roof. He fell onto a lower roof and then fell again onto the ground. He struck his head during the fall. Afterwards, he was unable to move his legs. He was transported as a level II trauma but upgraded to level I in transport due to hypotension. On arrival he complains of inability to move his legs and that his trunk and arms feel tingly.  S/p ACDF C5-C6 11/14/14.  Clinical Impression  Patient presents with decreased mobility due to deficits listed in PT problem list.  He will benefit from skilled PT in the acute setting to allow return home with family assist following CIR level rehab stay.      Follow Up Recommendations CIR    Equipment Recommendations  Other (comment) (TBA)    Recommendations for Other Services Rehab consult     Precautions / Restrictions Precautions Precautions: Cervical;Fall Required Braces or Orthoses: Cervical Brace Cervical Brace: Hard collar;At all times      Mobility  Bed Mobility Overal bed mobility: Needs Assistance Bed Mobility: Supine to Sit;Sit to Sidelying     Supine to sit: +2 for physical assistance;Total assist;HOB elevated   Sit to sidelying: Total assist;+2 for physical assistance General bed mobility comments: assist for trunk and LE's  Transfers                 General transfer comment: deferred due to patient 9/10 pain sitting EOB  Ambulation/Gait                Stairs            Wheelchair Mobility    Modified Rankin (Stroke Patients Only)       Balance Overall balance assessment: History of Falls;Needs assistance   Sitting balance-Leahy Scale: Zero Sitting balance - Comments: sat edge of bed about 8 minutes with mod to total assist                                     Pertinent Vitals/Pain  Pain Assessment: 0-10 Pain Score: 9  Pain Location: shoulders in sitting, also c/o aching all over Pain Descriptors / Indicators: Aching;Tingling;Shooting Pain Intervention(s): Limited activity within patient's tolerance;Repositioned;Monitored during session    Home Living Family/patient expects to be discharged to:: Inpatient rehab Living Arrangements: Alone Available Help at Discharge: Family Type of Home: House Home Access: Stairs to enter Entrance Stairs-Rails: Lawyer of Steps: 6 Home Layout: Multi-level;Full bath on main level;Able to live on main level with bedroom/bathroom Home Equipment: None      Prior Function Level of Independence: Independent         Comments: pastors a church in [employer]   Dominant Hand: Right    Extremity/Trunk Assessment   Upper Extremity Assessment: RUE deficits/detail;LUE deficits/detail RUE Deficits / Details: AROM shoulder flexion at least to 90, elbow flexion limited due to staples on post elbow from injury during fall; grips only with tenodesis, cannot lift thumb up into extension   RUE Sensation: decreased light touch LUE Deficits / Details: AAROM shoulder flexion at least to 90, elbow flexion slightly limited AROM strength at least 3/5, elbow extension at least 2-/5, able to lift thumb into extension    Lower Extremity Assessment:  LLE deficits/detail;RLE deficits/detail RLE Deficits / Details: PROM WFL no active movement noted throughout LLE Deficits / Details: PROM WFL no active movement noted throughout     Communication   Communication: No difficulties  Cognition Arousal/Alertness: Awake/alert Behavior During Therapy: WFL for tasks assessed/performed Overall Cognitive Status: Within Functional Limits for tasks assessed                      General Comments General comments (skin integrity, edema, etc.): ecchyomosis over right eye    Exercises Other Exercises Other  Exercises: 3-5 reps of PROM UE/LE's       Assessment/Plan    PT Assessment Patient needs continued PT services  PT Diagnosis Quadraplegia   PT Problem List Decreased activity tolerance;Decreased mobility;Decreased balance;Decreased strength;Pain;Impaired sensation;Decreased knowledge of use of DME;Decreased knowledge of precautions  PT Treatment Interventions DME instruction;Therapeutic activities;Therapeutic exercise;Patient/family education;Balance training;Wheelchair mobility training;Functional mobility training;Neuromuscular re-education   PT Goals (Current goals can be found in the Care Plan section) Acute Rehab PT Goals Patient Stated Goal: Improve pain PT Goal Formulation: With patient/family Time For Goal Achievement: 11/29/14 Potential to Achieve Goals: Fair    Frequency Min 3X/week   Barriers to discharge        Co-evaluation               End of Session   Activity Tolerance: Patient limited by pain Patient left: in bed;with call bell/phone within reach;with family/visitor present           Time: 1340-1411 PT Time Calculation (min) (ACUTE ONLY): 31 min   Charges:   PT Evaluation $Initial PT Evaluation Tier I: 1 Procedure PT Treatments $Therapeutic Activity: 8-22 mins   PT G Codes:        Sarahgrace Broman,CYNDI 12-11-2014, 2:34 PM  Sheran Lawless, PT [phone] 11-Dec-2014

## 2014-11-15 NOTE — Progress Notes (Signed)
Received prescreen request for inpatient rehab from PT and noted that rehab MD consult has already been placed. An admission coordinator will follow up after consult is completed. Thanks.  Juliann Mule, PT Rehabilitation Admissions Coordinator [phone]

## 2014-11-15 NOTE — Progress Notes (Signed)
Subjective: Patient reports "I can feel, but I can't move them (feet)"  Objective: Vital signs in last 24 hours: Temp:  [97.5 F (36.4 C)-102.4 F (39.1 C)] 99.8 F (37.7 C) (08/10 0757) Pulse Rate:  [40-94] 47 (08/10 0700) Resp:  [11-38] 12 (08/10 0700) BP: (74-130)/(44-59) 130/52 mmHg (08/10 0700) SpO2:  [92 %-99 %] 95 % (08/10 0700)  Intake/Output from previous day: 08/09 0701 - 08/10 0700 In: 2410 [I.V.:2410] Out: 2400 [Urine:2300; Blood:100] Intake/Output this shift:    Alert, conversant. Poor grip bilat, but able to move arms (biceps/triceps). Voices sesation to light touch both feet, but no movement.  Incision flat, without erythema or drainage beneath honeycomb & Dermabond.   Lab Results:  Recent Labs  11/13/14 0505 11/14/14 0615  WBC 8.3 7.8  HGB 11.0* 10.8*  HCT 31.5* 31.2*  PLT 140* 147*   BMET  Recent Labs  11/14/14 0615  NA 136  K 4.1  CL 100*  CO2 31  GLUCOSE 126*  BUN 16  CREATININE 1.05  CALCIUM 8.3*    Studies/Results: Dg Cervical Spine 2-3 Views  11/14/2014   CLINICAL DATA:  Cervical spine fracture.  EXAM: CERVICAL SPINE - 2-3 VIEW  COMPARISON:  MRI and CT 8/4 and 11/10/2014.  FINDINGS: Intraoperative lateral localization radiographs are submitted for interpretation. Initial localization shows probe at C4-C5. Subsequent images demonstrate C5-C6 ACDF. Endotracheal tube and orogastric tube present. Surgical packing is present on the last image in the precervical soft tissues.  IMPRESSION: C5-C6 ACDF.   Electronically Signed   By: Andreas Newport M.D.   On: 11/14/2014 14:22    Assessment/Plan:   LOS: 6 days  Continue support. Continue Foley for now d/t poor mobility.    Georgiann Cocker 11/15/2014, 8:04 AM

## 2014-11-15 NOTE — Evaluation (Signed)
Occupational Therapy Evaluation Patient Details Name: Carl [name] MRN: [id number] DOB: December 20, 1950 Today's Date: 11/15/2014    History of Present Illness Moris was painting a house and was up on the roof. He fell onto a lower roof and then fell again onto the ground. He struck his head during the fall. Afterwards, he was unable to move his legs. He was transported as a level II trauma but upgraded to level I in transport due to hypotension. On arrival he complains of inability to move his legs and that his trunk and arms feel tingly.  S/p ACDF C5-C6 11/14/14.   Clinical Impression   Pt admitted with above. He demonstrates the below listed deficits and will benefit from continued OT to maximize safety and independence with BADLs.  Pt instructed in initial HEP for Bil. UEs.  He will benefit from CIR to allow him to regain maximum indpendence with ADLs possible.       Follow Up Recommendations  CIR;Supervision/Assistance - 24 hour    Equipment Recommendations  None recommended by OT (To be determined by next venue)    Recommendations for Other Services Rehab consult     Precautions / Restrictions Precautions Precautions: Cervical;Fall Required Braces or Orthoses: Cervical Brace Cervical Brace: Hard collar;At all times Restrictions Weight Bearing Restrictions: No      Mobility Bed Mobility Overal bed mobility: Needs Assistance Bed Mobility: Rolling Rolling: Total assist            Transfers                 General transfer comment: did not attempt    Balance                                            ADL Overall ADL's : Needs assistance/impaired Eating/Feeding: Total assistance;Bed level   Grooming: Wash/dry face;Minimal assistance;Bed level Grooming Details (indicate cue type and reason): using Rt UE  Upper Body Bathing: Total assistance;Bed level   Lower Body Bathing: Total assistance;Bed level   Upper Body Dressing : Total  assistance;Bed level   Lower Body Dressing: Total assistance;Bed level   Toilet Transfer: Total assistance   Toileting- Clothing Manipulation and Hygiene: Total assistance;Bed level               Vision     Perception     Praxis      Pertinent Vitals/Pain Pain Assessment: 0-10 Pain Score: 8  Pain Location: shoulders, UEs, "all over" Pain Descriptors / Indicators: Constant;Tingling;Pins and needles;Aching Pain Intervention(s): Monitored during session     Hand Dominance Right   Extremity/Trunk Assessment Upper Extremity Assessment Upper Extremity Assessment: RUE deficits/detail RUE Deficits / Details: Rt UE:  deltoid 3+/5 - 4-/5; bicep tricep 3+/5 - 4-/5; sup/pron 2+/5; wrist flex/ext 3/5.  finger ext trace; finger flex trace RUE Sensation: decreased light touch RUE Coordination: decreased fine motor;decreased gross motor LUE Deficits / Details: Rt UE:  deltoid 3/5 -  bicep tricep 3/5 ; sup/pron 2+/5; wrist flex/ext 3/5.  finger ext 2/5; finger flex 1/5  LUE Sensation: decreased light touch   Lower Extremity Assessment Lower Extremity Assessment: Defer to PT evaluation       Communication Communication Communication: No difficulties   Cognition Arousal/Alertness: Awake/alert Behavior During Therapy: WFL for tasks assessed/performed Overall Cognitive Status: Within Functional Limits for tasks assessed  General Comments       Exercises Exercises: General Upper Extremity     Shoulder Instructions      Home Living Family/patient expects to be discharged to:: Inpatient rehab Living Arrangements: Spouse/significant other Available Help at Discharge: Family Type of Home: House Home Access: Stairs to enter [employer] of Steps: 6 Entrance Stairs-Rails: Left;Right Home Layout: Multi-level;Full bath on main level;Able to live on main level with bedroom/bathroom Alternate Level Stairs-Number of Steps: don't go up to  second level, has a basement   Bathroom Shower/Tub: Tub only   Firefighter: Standard     Home Equipment: None          Prior Functioning/Environment Level of Independence: Independent        Comments: pastors a church in [place]    OT Diagnosis: Generalized weakness;Acute pain;Paresis   OT Problem List: Decreased strength;Decreased range of motion;Decreased activity tolerance;Impaired balance (sitting and/or standing);Decreased coordination;Decreased knowledge of use of DME or AE;Decreased knowledge of precautions;Impaired sensation   OT Treatment/Interventions: Self-care/ADL training;Therapeutic exercise;Neuromuscular education;DME and/or AE instruction;Manual therapy;Splinting;Therapeutic activities;Patient/family education;Balance training    OT Goals(Current goals can be found in the care plan section) Acute Rehab OT Goals Patient Stated Goal: to increase ability to do things for self  OT Goal Formulation: With patient Time For Goal Achievement: 11/29/14 Potential to Achieve Goals: Good ADL Goals Pt Will Perform Eating: with min assist;with adaptive utensils;sitting;bed level Pt Will Perform Grooming: with mod assist;with adaptive equipment;sitting;bed level Pt Will Perform Upper Body Bathing: with mod assist;sitting;bed level Additional ADL Goal #1: Pt will be able to tolerate circle sitting x 15 mins with min guard assist in prep for ADLs Additional ADL Goal #2: Pt/family will be independent with HEP for bil. UE ROM and strengthening.   OT Frequency: Min 2X/week   Barriers to D/C: Decreased caregiver support  unsure wife can provide necessary level of assist        Co-evaluation              End of Session Equipment Utilized During Treatment: Cervical collar Nurse Communication: Mobility status  Activity Tolerance: Patient tolerated treatment well Patient left: in bed;with call bell/phone within reach   Time:  -    Charges:    G-Codes:     Jeani Hawking M Nov 17, 2014, 10:42 PM

## 2014-11-16 ENCOUNTER — Inpatient Hospital Stay (HOSPITAL_COMMUNITY): Payer: Federal, State, Local not specified - PPO

## 2014-11-16 LAB — BASIC METABOLIC PANEL
Anion gap: 5 (ref 5–15)
BUN: 20 mg/dL (ref 6–20)
CALCIUM: 8.2 mg/dL — AB (ref 8.9–10.3)
CO2: 30 mmol/L (ref 22–32)
CREATININE: 1.13 mg/dL (ref 0.61–1.24)
Chloride: 99 mmol/L — ABNORMAL LOW (ref 101–111)
GFR calc non Af Amer: >60 mL/min (ref >60)
Glucose, Bld: 132 mg/dL — ABNORMAL HIGH (ref 65–99)
Potassium: 4.3 mmol/L (ref 3.5–5.1)
Sodium: 134 mmol/L — ABNORMAL LOW (ref 135–145)

## 2014-11-16 LAB — CBC
HCT: 31.3 % — ABNORMAL LOW (ref 39.0–52.0)
HEMOGLOBIN: 10.7 g/dL — AB (ref 13.0–17.0)
MCH: 30.6 pg (ref 26.0–34.0)
MCHC: 34.2 g/dL (ref 30.0–36.0)
MCV: 89.4 fL (ref 78.0–100.0)
PLATELETS: 177 10*3/uL (ref 150–400)
RBC: 3.5 MIL/uL — AB (ref 4.22–5.81)
RDW: 12.7 % (ref 11.5–15.5)
WBC: 10.9 10*3/uL — ABNORMAL HIGH (ref 4.0–10.5)

## 2014-11-16 MED ORDER — ENOXAPARIN SODIUM 40 MG/0.4ML ~~LOC~~ SOLN
40.0000 mg | SUBCUTANEOUS | Status: DC
  Administered 2014-11-16 – 2014-11-20 (×5): 40 mg via SUBCUTANEOUS
  Filled 2014-11-16 (×5): qty 0.4

## 2014-11-16 NOTE — Progress Notes (Signed)
Subjective: Patient reports "I'm kinda tired...therapy just worked with me some"  Objective: Vital signs in last 24 hours: Temp:  [98.5 F (36.9 C)-100.2 F (37.9 C)] 98.5 F (36.9 C) (08/11 1028) Pulse Rate:  [42-51] 42 (08/11 1028) Resp:  [10-18] 16 (08/11 1028) BP: (103-148)/(42-55) 103/46 mmHg (08/11 1028) SpO2:  [93 %-96 %] 96 % (08/11 1028) Weight:  [90.719 kg (200 lb)] 90.719 kg (200 lb) (08/11 0130)  Intake/Output from previous day: 08/10 0701 - 08/11 0700 In: 1360 [P.O.:160; I.V.:1200] Out: 1325 [Urine:1325] Intake/Output this shift: Total I/O In: 20 [I.V.:20] Out: -   Awake, conversant. Reports generalized pain/discomfort that responds to medications.  Afebrile at present. Cervical incision without erythema, swelling, or drainage beneath honeycomb drsg and dermabond. Vista collar in use. Pt lifts arms from bed, reaching above shoulder level.  Little hand strength. No movement below waist today, but sensation intact.  Lab Results:  Recent Labs  11/14/14 0615 11/16/14 1048  WBC 7.8 10.9*  HGB 10.8* 10.7*  HCT 31.2* 31.3*  PLT 147* 177   BMET  Recent Labs  11/14/14 0615 11/16/14 1048  NA 136 134*  K 4.1 4.3  CL 100* 99*  CO2 31 30  GLUCOSE 126* 132*  BUN 16 20  CREATININE 1.05 1.13  CALCIUM 8.3* 8.2*    Studies/Results: Dg Chest Port 1 View  11/16/2014   CLINICAL DATA:  Postop fever  EXAM: PORTABLE CHEST - 1 VIEW  COMPARISON:  11/10/2014  FINDINGS: A left-sided PICC line and is now seen with the catheter tip in the mid superior vena cava in satisfactory position. Small bilateral pleural effusions with associated atelectatic changes are seen. Cardiac shadow is stable. Previously noted rib fractures are not well appreciated. No pneumothorax is seen.  IMPRESSION: Small bilateral pleural effusions with small basilar atelectasis. Status post PICC line placement in the mid superior vena cava.   Electronically Signed   By: Alcide Clever M.D.   On: 11/16/2014  11:25    Assessment/Plan:   LOS: 7 days  Continue PT/OT, with plan for CIR.   Georgiann Cocker 11/16/2014, 2:38 PM

## 2014-11-16 NOTE — Progress Notes (Signed)
Patient arrived to 4N04 AAOx4. Vitals taken, tele applied, pain 8/10. ICU RN will call family with transfer info. Will continue to monitor. Akin Yi, [place] Scrape, RN

## 2014-11-16 NOTE — Progress Notes (Signed)
Patient ID: Carl [name], male   DOB: 06-11-1950, 64 y.o.   MRN: [id number]  LOS: 7 days   Subjective: Sweats started 30 mins ago.  Febrile.  Has a foley.  No sob, cp or cough.   Objective: Vital signs in last 24 hours: Temp:  [98.8 F (37.1 C)-100.4 F (38 C)] 100 F (37.8 C) (08/11 0521) Pulse Rate:  [43-55] 44 (08/11 0521) Resp:  [10-18] 13 (08/11 0521) BP: (131-164)/(42-65) 148/42 mmHg (08/11 0521) SpO2:  [92 %-96 %] 95 % (08/11 0521) Weight:  [90.719 kg (200 lb)] 90.719 kg (200 lb) (08/11 0130)    Lab Results:  CBC  Recent Labs  11/14/14 0615  WBC 7.8  HGB 10.8*  HCT 31.2*  PLT 147*   BMET  Recent Labs  11/14/14 0615  NA 136  K 4.1  CL 100*  CO2 31  GLUCOSE 126*  BUN 16  CREATININE 1.05  CALCIUM 8.3*    Imaging: Dg Cervical Spine 2-3 Views  11/14/2014   CLINICAL DATA:  Cervical spine fracture.  EXAM: CERVICAL SPINE - 2-3 VIEW  COMPARISON:  MRI and CT 8/4 and 11/10/2014.  FINDINGS: Intraoperative lateral localization radiographs are submitted for interpretation. Initial localization shows probe at C4-C5. Subsequent images demonstrate C5-C6 ACDF. Endotracheal tube and orogastric tube present. Surgical packing is present on the last image in the precervical soft tissues.  IMPRESSION: C5-C6 ACDF.   Electronically Signed   By: Andreas Newport M.D.   On: 11/14/2014 14:22     PE: General appearance: alert, cooperative and mild distress Neck: no adenopathy, no carotid bruit, no JVD, supple, symmetrical, trachea midline, thyroid not enlarged, symmetric, no tenderness/mass/nodules and c collar Resp: diminished to BLL.  no crackles.   Cardio: regular rate and rhythm, S1, S2 normal, no murmur, click, rub or gallop Neurologic: moves upper extremities, but weak.  Sensation to BLE, but unable to move yet.   Patient Active Problem List   Diagnosis Date Noted  . Quadriplegia 11/14/2014  . C5 vertebral fracture 11/09/2014    Assessment/Plan: Fall C5-6 FXs and cord  injury - s/p ACDF by Dr. Venetia Maxon moves UEs, sensation to LEs.  lyrica for neuropathic pain.  PT/OT.  c collar.  R facial lac Chronic bradycardia and acute neurogenic shock - remains bradycardic, but stable.  Tele. ID-fevers.  Check UA, CXR and labs.  Needs IS instruction.   FEN - advance diet, IVF VTE - SCD, ?when to start lovenox Dispo - fevers.  Rehab following    Ashok Norris, ANP-BC Pager: [phone] General Trauma [place] Pager: [phone]   11/16/2014 9:33 AM

## 2014-11-16 NOTE — Progress Notes (Addendum)
Physical Therapy Treatment Patient Details Name: Carl [name] MRN: [id number] DOB: January 16, 1951 Today's Date: 11/16/2014    History of Present Illness Carl [name] was painting a house and was up on the roof. He fell onto a lower roof and then fell again onto the ground. He struck his head during the fall. Afterwards, he was unable to move his legs. He was transported as a level II trauma but upgraded to level I in transport due to hypotension. On arrival he complains of inability to move his legs and that his trunk and arms feel tingly.  S/p ACDF C5-C6 11/14/14.    PT Comments    Patient seen for progression of activities. Patient sat EOB >15 mins with activity.  BLEs wrapped for BP control during session. Facilitation of trunk control with therapeutic exercises and PROM BLEs. Patient may benefit from [facility] to maintain ROM. Will continue to see and progress as tolerated. OF NOTE: Patient attempting shoulder shrugs at EOB with facilitation by this therapist. RUE shoulder with "snapping" like movement palpated around glenohumeral joint with shoulder shrug. Patient reports that prior to fall he did not have any issues with his Right shoulder. Will monitor.  BP: HOB 33 degrees 94/49  HOB 50 degrees 98/49 with BLEs Wrapped  Sitting EOB with BLEs wrapped 96/54; 97/54 HR low 44, 38 Return to supine HOB 25 degrees with BLEs wrapped 116/57 HR 39  Follow Up Recommendations  CIR     Equipment Recommendations  Other (comment) (TBA)    Recommendations for Other Services Rehab consult     Precautions / Restrictions Precautions Precautions: Cervical;Fall Precaution Comments: Monitor BP closely with BP control measures (wrapping, SCDs, Ted hose EOB) Required Braces or Orthoses: Cervical Brace Cervical Brace: Hard collar;At all times Restrictions Weight Bearing Restrictions: No    Mobility  Bed Mobility Overal bed mobility: Needs Assistance Bed Mobility: Supine to Sit;Sit to Sidelying     Supine to  sit: +2 for physical assistance;Total assist;HOB elevated   Sit to sidelying: Total assist;+2 for physical assistance General bed mobility comments: assist for trunk and LE's, helicopter technique with chuck pad  Transfers                 General transfer comment: did not attempt  Ambulation/Gait                 Stairs            Wheelchair Mobility    Modified Rankin (Stroke Patients Only)       Balance     Sitting balance-Leahy Scale: Zero Sitting balance - Comments: sat edge of bed about 8 minutes with mod to total assist                            Cognition Arousal/Alertness: Awake/alert Behavior During Therapy: WFL for tasks assessed/performed;Flat affect Overall Cognitive Status: Within Functional Limits for tasks assessed                      Exercises Other Exercises Other Exercises: trunk control activities EOB. anterior/posterior leans. lateral lean to elbows with assisted UE movement to power to midline x5 bilaterally. PROM BLEs in sitting with pressure through knees for heel cord stretch    General Comments General comments (skin integrity, edema, etc.): Vitals assessed, BLE wrapped with ace wraps for BP control. Assisted to EOB. VS range90/40s with HR upper 30s to low 40s. patient with increased BP upon  return to supine with LEs wrapped 110s/50s      Pertinent Vitals/Pain Pain Assessment: 0-10 Pain Score: 7  Pain Location: shoulders and neck Pain Descriptors / Indicators: Constant;Aching;Throbbing;Tingling Pain Intervention(s): Limited activity within patient's tolerance;Monitored during session;Repositioned;Relaxation    Home Living                      Prior Function            PT Goals (current goals can now be found in the care plan section) Acute Rehab PT Goals Patient Stated Goal: Improve pain PT Goal Formulation: With patient/family Time For Goal Achievement: 11/29/14 Potential to Achieve  Goals: Fair Progress towards PT goals: Progressing toward goals    Frequency  Min 3X/week    PT Plan Current plan remains appropriate    Co-evaluation             End of Session Equipment Utilized During Treatment: Cervical collar Activity Tolerance: Patient limited by pain Patient left: in bed;with call bell/phone within reach;with family/visitor present     Time: 1610-9604 PT Time Calculation (min) (ACUTE ONLY): 32 min  Charges:  $Therapeutic Activity: 23-37 mins                    G CodesFabio Asa December 14, 2014, 4:26 PM Charlotte Crumb, PT DPT  [phone]

## 2014-11-16 NOTE — Progress Notes (Signed)
Occupational Therapy Progress Note  Pt was instructed in use of AE for self feeding.  Using universal cuff and lidded mug, he was able to feed self with min A - appetite limited and pt with very flat affect.  He demonstrate 1/5 (15%) active finger flexion Lt hand this date.     11/16/14 1700  OT Visit Information  Last OT Received On 11/16/14  Assistance Needed +2  History of Present Illness Carl [name] was painting a house and was up on the roof. He fell onto a lower roof and then fell again onto the ground. He struck his head during the fall. Afterwards, he was unable to move his legs. He was transported as a level II trauma but upgraded to level I in transport due to hypotension. On arrival he complains of inability to move his legs and that his trunk and arms feel tingly.  S/p ACDF C5-C6 11/14/14.  OT Time Calculation  OT Start Time (ACUTE ONLY) 1156  OT Stop Time (ACUTE ONLY) 1243  OT Time Calculation (min) 47 min  Precautions  Precautions Cervical;Fall  Precaution Comments Monitor BP closely with BP control measures (wrapping, SCDs, Ted hose EOB)  Required Braces or Orthoses Cervical Brace  Cervical Brace Hard collar;At all times  Pain Assessment  Pain Assessment 0-10  Pain Score 6  Pain Location shoulders and neck  Pain Descriptors / Indicators Constant;Aching;Pins and needles  Pain Intervention(s) Monitored during session  Cognition  Arousal/Alertness Awake/alert  Behavior During Therapy Flat affect  Overall Cognitive Status Within Functional Limits for tasks assessed  ADL  Overall ADL's  Needs assistance/impaired  Eating/Feeding Minimal assistance  Eating/Feeding Details (indicate cue type and reason) Using lidded mug and universal cuff, pt was able to feed self with minA to guide Rt UE initially, and to prevent dropping of cup while drinking from cup.    General ADL Comments Pt with very flat affect and little interaction   Other Exercises  Other Exercises Pt noted now with ~15%  finger flexion on Lt hand.  Encouraged him to continue to work on active Finger flex/ext bil. hands  OT - End of Session  Equipment Utilized During Treatment Cervical collar  Activity Tolerance Patient tolerated treatment well  Patient left in bed;with call bell/phone within reach;with family/visitor present;with nursing/sitter in room  Nurse Communication Mobility status  OT Assessment/Plan  OT Plan Discharge plan remains appropriate  OT Frequency (ACUTE ONLY) Min 2X/week  Recommendations for Other Services Rehab consult  Follow Up Recommendations CIR;Supervision/Assistance - 24 hour  OT Equipment None recommended by OT  OT Goal Progression  Progress towards OT goals Progressing toward goals  ADL Goals  Pt Will Perform Eating with min assist;with adaptive utensils;sitting;bed level  Pt Will Perform Grooming with mod assist;with adaptive equipment;sitting;bed level  Pt Will Perform Upper Body Bathing with mod assist;sitting;bed level  Additional ADL Goal #1 Pt will be able to tolerate circle sitting x 15 mins with min guard assist in prep for ADLs  Additional ADL Goal #2 Pt/family will be independent with HEP for bil. UE ROM and strengthening.   OT General Charges  $OT Visit 1 Procedure  OT Treatments  $Self Care/Home Management  38-52 mins  [employer], OTR/L [phone]

## 2014-11-17 LAB — URINALYSIS, ROUTINE W REFLEX MICROSCOPIC
GLUCOSE, UA: NEGATIVE mg/dL
Hgb urine dipstick: NEGATIVE
Ketones, ur: NEGATIVE mg/dL
LEUKOCYTES UA: NEGATIVE
Nitrite: NEGATIVE
PH: 5.5 (ref 5.0–8.0)
Protein, ur: NEGATIVE mg/dL
Specific Gravity, Urine: 1.02 (ref 1.005–1.030)
Urobilinogen, UA: 4 mg/dL — ABNORMAL HIGH (ref 0.0–1.0)

## 2014-11-17 MED ORDER — DEXAMETHASONE SODIUM PHOSPHATE 4 MG/ML IJ SOLN
4.0000 mg | Freq: Four times a day (QID) | INTRAMUSCULAR | Status: AC
  Administered 2014-11-17 – 2014-11-19 (×8): 4 mg via INTRAVENOUS
  Filled 2014-11-17 (×8): qty 1

## 2014-11-17 NOTE — Progress Notes (Signed)
PT Cancellation Note  Patient Details Name: Carl [name] MRN: [id number] DOB: [birth year]   Met with patient in room. Patient meal has arrived and patient preparing to eat with family assist. Spoke with patient regarding plan for OOB to chair next day (obtained tilt in space w/c from CIR for patient use during hospitalization). Patient in agreement. Patient with some flat affect during conversation and states "at times he is doing well and at times it is hard." Offered encouragement and support.   Duncan Dull 11/17/2014, 4:51 PM Alben Deeds, [place] DPT  [phone]

## 2014-11-17 NOTE — Progress Notes (Signed)
Rehab admissions - Evaluated for possible admission to acute inpatient rehab.  I met with patient and his wife.  I explained inpatient rehab, gave them rehab booklets and answered questions.  Wife is retired and willing to assist after rehab stay.  Wife tells me that patient has [employer]. Patient will need to show increased tolerance for activity and be able to tolerate up in chair for an hour at a time prior inpatient rehab admissions.  I will follow along for now.  Call me for questions.  #[phone]

## 2014-11-17 NOTE — Care Management Note (Signed)
Case Management Note  Patient Details  Name: Carl [name] MRN: [id number] Date of Birth: [birth year]  Subjective/Objective:  Pt making progress with PT/OT.  Wife able to provide care upon dc.                     Action/Plan: CIR following for possible admission pending insurance authorization.  Will follow progress.    Expected Discharge Date:                  Expected Discharge Plan:  IP Rehab Facility  In-House Referral:  Clinical Social Work  Discharge planning Services  CM Consult  Post Acute Care Choice:    Choice offered to:     DME Arranged:    DME Agency:     HH Arranged:    HH Agency:     Status of Service:  In process, will continue to follow  Medicare Important Message Given:    Date Medicare IM Given:    Medicare IM give by:    Date Additional Medicare IM Given:    Additional Medicare Important Message give by:     If discussed at Long Length of Stay Meetings, dates discussed:    Additional Comments:  Quintella Baton, RN, BSN  Trauma/Neuro ICU Case Manager [phone]

## 2014-11-17 NOTE — Progress Notes (Signed)
Patient ID: Carl [name], male   DOB: 1951-02-14, 64 y.o.   MRN: [id number]  LOS: 8 days   Subjective: Appetite poor.  Good UOP.  Afebrile x24h.  Passing flatus.   Objective: Vital signs in last 24 hours: Temp:  [98.2 F (36.8 C)-99 F (37.2 C)] 98.8 F (37.1 C) (08/12 0525) Pulse Rate:  [37-42] 37 (08/12 0525) Resp:  [14-17] 17 (08/12 0525) BP: (100-116)/(46-57) 100/46 mmHg (08/12 0525) SpO2:  [96 %-99 %] 97 % (08/12 0525)    Lab Results:  CBC  Recent Labs  11/16/14 1048  WBC 10.9*  HGB 10.7*  HCT 31.3*  PLT 177   BMET  Recent Labs  11/16/14 1048  NA 134*  K 4.3  CL 99*  CO2 30  GLUCOSE 132*  BUN 20  CREATININE 1.13  CALCIUM 8.2*    Imaging: Dg Chest Port 1 View  11/16/2014   CLINICAL DATA:  Postop fever  EXAM: PORTABLE CHEST - 1 VIEW  COMPARISON:  11/10/2014  FINDINGS: A left-sided PICC line and is now seen with the catheter tip in the mid superior vena cava in satisfactory position. Small bilateral pleural effusions with associated atelectatic changes are seen. Cardiac shadow is stable. Previously noted rib fractures are not well appreciated. No pneumothorax is seen.  IMPRESSION: Small bilateral pleural effusions with small basilar atelectasis. Status post PICC line placement in the mid superior vena cava.   Electronically Signed   By: Alcide Clever M.D.   On: 11/16/2014 11:25      PE: General appearance: alert, cooperative and mild distress Neck: c collar Resp: clear bilaterally.   Cardio: regular rate and rhythm, S1, S2 normal, no murmur, click, rub or gallop Neurologic: moves upper extremities, but weak. Sensation to BLE, but unable to move yet.    Patient Active Problem List   Diagnosis Date Noted  . Quadriplegia 11/14/2014  . C5 vertebral fracture 11/09/2014    Assessment/Plan: Fall C5-6 FXs and cord injury - s/p ACDF by Dr. Venetia Maxon moves UEs, sensation to LEs. lyrica for neuropathic pain. PT/OT/SLP. c collar. decadron started. R facial  lac Chronic bradycardia and acute neurogenic shock - remains bradycardic, but stable. Tele. GU-?should we DC foley ID-afebrile x24h, UA and CXR ok  FEN - advance diet, DC IVF when po intake improves.  VTE - SCD/lovenox Dispo - CIR  Ashok Norris, ANP-BC Pager: [phone] General Trauma PA Pager: [phone]   11/17/2014 8:39 AM

## 2014-11-17 NOTE — Progress Notes (Signed)
Subjective: Patient reports "I might be able to work up something" (as he moves his right foot side to side)  Objective: Vital signs in last 24 hours: Temp:  [98.2 F (36.8 C)-99 F (37.2 C)] 98.8 F (37.1 C) (08/12 0525) Pulse Rate:  [37-42] 37 (08/12 0525) Resp:  [14-17] 17 (08/12 0525) BP: (100-116)/(46-57) 100/46 mmHg (08/12 0525) SpO2:  [96 %-99 %] 97 % (08/12 0525)  Intake/Output from previous day: 08/11 0701 - 08/12 0700 In: 30 [I.V.:30] Out: 1600 [Urine:1600] Intake/Output this shift:    Awake, answers questions, but offers no conversantion. Able to move right foot some this morning. No change in BUE, still able to lift from bed to above shoulder level, but pt is frustrated with lack of hand dexterity. Cervical incision without erythema, swellng, or drainage. Honeycomb over Dermabond intact.  Pt offers only report of sore throat, but nursing reports dysphagia as well noticed with percocet (crushed in applesauce).   Lab Results:  Recent Labs  11/16/14 1048  WBC 10.9*  HGB 10.7*  HCT 31.3*  PLT 177   BMET  Recent Labs  11/16/14 1048  NA 134*  K 4.3  CL 99*  CO2 30  GLUCOSE 132*  BUN 20  CREATININE 1.13  CALCIUM 8.2*    Studies/Results: Dg Chest Port 1 View  11/16/2014   CLINICAL DATA:  Postop fever  EXAM: PORTABLE CHEST - 1 VIEW  COMPARISON:  11/10/2014  FINDINGS: A left-sided PICC line and is now seen with the catheter tip in the mid superior vena cava in satisfactory position. Small bilateral pleural effusions with associated atelectatic changes are seen. Cardiac shadow is stable. Previously noted rib fractures are not well appreciated. No pneumothorax is seen.  IMPRESSION: Small bilateral pleural effusions with small basilar atelectasis. Status post PICC line placement in the mid superior vena cava.   Electronically Signed   By: Alcide Clever M.D.   On: 11/16/2014 11:25    Assessment/Plan:   LOS: 8 days  Per DrStern, continue PT/OT. ST for swallow eval.  Decadron  IV q6hrs x2days. Orders entered.   Georgiann Cocker 11/17/2014, 7:54 AM

## 2014-11-17 NOTE — Clinical Social Work Note (Signed)
Clinical Social Worker continuing to follow patient and family for support and discharge planning needs.  Patient has been evaluated by inpatient rehab and is working towards being appropriate to initiate insurance authorization through [employer].  Patient will need to tolerate some time in the chair in order to be appropriate.  Patient wife plans to provide support to patient at discharge.  CSW remains available for support and to assist with discharge planning needs if deemed appropriate.  Macario Golds, [place] [phone]

## 2014-11-17 NOTE — Evaluation (Signed)
Clinical/Bedside Swallow Evaluation Patient Details  Name: Carl [name] MRN: [id number] Date of Birth: [birth year]  Today's Date: 11/17/2014 Time: SLP Start Time (ACUTE ONLY): 0855 SLP Stop Time (ACUTE ONLY): 0918 SLP Time Calculation (min) (ACUTE ONLY): 23 min  Past Medical History: History reviewed. No pertinent past medical history. Past Surgical History:  Past Surgical History  Procedure Laterality Date  . Anterior cervical decomp/discectomy fusion N/A 11/14/2014    Procedure: Cervical Five-Cervical Six  Anterior Cervical Decompression/Diskectomy/Fusion;  Surgeon: Maeola Harman, MD;  Location: [facility];  Service: Neurosurgery;  Laterality: N/A;   HPI:  Pt is a 64 yo male who on 11/09/14 fell onto a lower roof and then fell again onto the ground. He struck his head during the fall. Afterwards, he was unable to move his legs. He was transported as a level II trauma but upgraded to level I in transport due to hypotension. On arrival he complains of inability to move his legs and that his trunk and arms feel tingly. MRI on 11/10/14 revealed Cervical cord contusion extending dorsal to C5 and C6 vertebrae. 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  Pt c/o some difficulty intermittently when swallowing liquids; he noted odynophagia (pain with swallowing) and stated it is easier if he "has small amounts of liquids."  Assessment / Plan / Recommendation Clinical Impression   Pt with acute onset dysphagia d/t recent neck injury (C5-C6 vertebrae) including immediate cough with larger amounts of thin liquids via straw, but this resolved with smaller sips with a straw or via tsp (1/2 tsp amounts).  He also exhibited multiple swallows during BSE with all consistencies, but reduced with smaller amounts via tsp or with smaller volumes with straw.  Pt refused  solid consistencies at this time d/t odynophagia, so a soft diet is recommended d/t pt pain/postural concerns with neck injury.  Pt is at a mild-moderate risk for aspiration without use of swallowing precautions.  MBS may be considered if modifications to diet with use of swallowing precautions do not fully eliminate aspiration risk after ST f/u visit, but pt appears to be tolerating thin/soft consistencies with use of swallowing precautions.  ST to f/u to assess full diet tolerance with meals and fatigue factor.    Aspiration Risk  Mild    Diet Recommendation Thin;Other (Comment) (soft diet)   Medication Administration: Other (Comment) (crushed with puree; 1/2 tsp amounts) Compensations: Slow rate;Small sips/bites    Other  Recommendations Oral Care Recommendations: Oral care BID   Follow Up Recommendations    CIR   Frequency and Duration min 3x week  2 weeks   Pertinent Vitals/Pain Elevated BP intermittently    SLP Swallow Goals  See POC   Swallow Study Prior Functional Status   Independent at home    General Date of Onset: 11/09/14 Other Pertinent Information: Pt is a 64 yo male who fell onto a lower roof on 11/09/14 and then fell again onto the ground. He struck his head during the fall. Afterwards, he was unable to move his legs. He was transported as a level II trauma but upgraded to level I in transport due to hypotension. On arrival he complained of inability to move his legs and that his trunk and arms feel tingly. Type of Study: Bedside swallow evaluation Diet Prior to this Study: Thin liquids;Other (Comment) (Regular diet (recently upgraded)) Temperature Spikes Noted: No Respiratory Status: Room  air History of Recent Intubation: Yes Length of Intubations (days):  (during surgical procedure only; < 24 hrs) Behavior/Cognition: Alert;Cooperative;Pleasant mood Oral Cavity - Dentition: Adequate natural dentition/normal for age Self-Feeding Abilities: Needs assist;Needs set  up Patient Positioning: Upright in bed Baseline Vocal Quality: Low vocal intensity Volitional Cough: Strong Volitional Swallow: Able to elicit    Oral/Motor/Sensory Function Overall Oral Motor/Sensory Function: Appears within functional limits for tasks assessed   Ice Chips Ice chips: Not tested   Thin Liquid Thin Liquid: Impaired Presentation: Cup;Spoon;Straw Oral Phase Functional Implications: Other (comment) (odynophagia (pain with swallowing)) Pharyngeal  Phase Impairments: Multiple swallows;Cough - Immediate Other Comments:  (cough with larger volume of liquids via straw only)    Nectar Thick Nectar Thick Liquid: Not tested   Honey Thick Honey Thick Liquid: Not tested   Puree Puree: Impaired Presentation: Spoon Pharyngeal Phase Impairments: Multiple swallows Other Comments:  (Pt says easier with smaller bites)   Solid       Solid: Not tested (per pt request)       Krishna Heuer,PAT, M.S., CCC-SLP 11/17/2014,9:38 AM

## 2014-11-18 MED ORDER — BISACODYL 10 MG RE SUPP
10.0000 mg | Freq: Once | RECTAL | Status: AC
  Administered 2014-11-18: 10 mg via RECTAL
  Filled 2014-11-18: qty 1

## 2014-11-18 NOTE — Progress Notes (Addendum)
Speech Language Pathology Treatment: Dysphagia  Patient Details Name: Carl [name] MRN: [id number] DOB: 12-Sep-1950 Today's Date: 11/18/2014 Time: 4098-1191 SLP Time Calculation (min) (ACUTE ONLY): 23 min  Assessment / Plan / Recommendation Clinical Impression  Pt reports improved swallow ability today, stating yesterday he would occasionally choke on thin liquids but has tolerated well today.  In addition, pt denies odynophagia nor sensation of pharyngeal stasis.    Assisted pt to consume water via straw and banana - but uses caution and eats slowly with intake!  No s/s of aspiration nor airway compromise.  Intermittent multiple swallows apparent with audible swallow.  Recommend continue soft/thin diet, will follow up later next week.  SLP copied swallow precaution sign and provided pt his copy for reinforcement.  Family/pt agreeable to plan.    Diet modified to dys3 as this is a mechanical soft diet.    HPI Other Pertinent Information: He fell onto a lower roof and then fell again onto the ground. He struck his head during the fall. Afterwards, he was unable to move his legs. He was transported as a level II trauma but upgraded to level I in transport due to hypotension. On arrival he complains of inability to move his legs and that his trunk and arms feel tingly.  Pt is s/p ACDF 11/11/14.  Started on a soft diet.  Follow up to assess tolerance and assess fdor instrumental swallow evaluation indication.     Pertinent Vitals Pain Assessment: 0-10 Pain Location: abdomen pain Pain Intervention(s): RN gave pain meds during session;Limited activity within patient's tolerance  SLP Plan  Continue with current plan of care    Recommendations Diet recommendations: Dysphagia 3 (mechanical soft);Thin liquid Medication Administration: Whole meds with puree (crush or 1/2 if problematic) Supervision: Staff to assist with self feeding;Full supervision/cueing for compensatory strategies Compensations: Slow  rate;Small sips/bites Postural Changes and/or Swallow Maneuvers: Seated upright 90 degrees;Upright 30-60 min after meal              Oral Care Recommendations: Oral care BID Plan: Continue with current plan of care    GO    Donavan Burnet, MS [facility] SLP [phone]

## 2014-11-18 NOTE — Progress Notes (Signed)
AVSS Awake and alert UE motor strength unchanged RLE internal/external rotation of lower leg 2/5 Sensation unchanged Incision c/d/i with dressing Collar well fitting Continue current care

## 2014-11-18 NOTE — Progress Notes (Signed)
Trauma Service Note  Subjective: No distress.  Again, seems depressed.  Objective: Vital signs in last 24 hours: Temp:  [98.1 F (36.7 C)-98.6 F (37 C)] 98.1 F (36.7 C) (08/13 0933) Pulse Rate:  [40-43] 40 (08/13 0531) Resp:  [18] 18 (08/13 0933) BP: (116-139)/(56-70) 121/66 mmHg (08/13 0933) SpO2:  [93 %-96 %] 93 % (08/13 0933)    Intake/Output from previous day: 08/12 0701 - 08/13 0700 In: 20 [I.V.:20] Out: 800 [Urine:800] Intake/Output this shift: Total I/O In: 10 [I.V.:10] Out: -   General: No acute distress.  Has not had a BM  Getting colace  Lungs: Clear.  Abd: Soft, benign.  Advanced to Dysphagia III diet  Extremities: No changes  Neuro: Same neurological deficits  Lab Results: CBC   Recent Labs  11/16/14 1048  WBC 10.9*  HGB 10.7*  HCT 31.3*  PLT 177   BMET  Recent Labs  11/16/14 1048  NA 134*  K 4.3  CL 99*  CO2 30  GLUCOSE 132*  BUN 20  CREATININE 1.13  CALCIUM 8.2*   PT/INR No results for input(s): LABPROT, INR in the last 72 hours. ABG No results for input(s): PHART, HCO3 in the last 72 hours.  Invalid input(s): PCO2, PO2  Studies/Results: No results found.  Anti-infectives: Anti-infectives    Start     Dose/Rate Route Frequency Ordered Stop   11/14/14 2300  vancomycin (VANCOCIN) IVPB 1000 mg/200 mL premix     1,000 mg 200 mL/hr over 60 Minutes Intravenous  Once 11/14/14 1712 11/14/14 2353   11/14/14 1228  vancomycin (VANCOCIN) 1 GM/200ML IVPB    Comments:  [name]   : cabinet override      11/14/14 1228 11/15/14 0044      Assessment/Plan: s/p Procedure(s): Cervical Five-Cervical Six  Anterior Cervical Decompression/Diskectomy/Fusion Dulcolax suppository.  LOS: 9 days   [name]. Gae Bon, MD, FACS [phone] Trauma Surgeon 11/18/2014

## 2014-11-18 NOTE — Progress Notes (Signed)
Physical Therapy Treatment Patient Details Name: Carl [name] MRN: [id number] DOB: 10-Apr-1950 Today's Date: 11/18/2014    History of Present Illness Carl [name] was painting a house and was up on the roof. He fell onto a lower roof and then fell again onto the ground. He struck his head during the fall. Afterwards, he was unable to move his legs. He was transported as a level II trauma but upgraded to level I in transport due to hypotension. On arrival he complains of inability to move his legs and that his trunk and arms feel tingly.  S/p ACDF C5-C6 11/14/14.    PT Comments    Pt able to sit EOB with full support, reporting minimal dizziness (see vitals below) and tolerated exercises well, while sitting in Tilt-in-space wheelchair.  Demonstrating functional use of UEs by hooking arms with PT to roll onto side and assist with pulling self into seated position. Patient will continue to benefit from skilled physical therapy services to further improve independence with functional mobility.   Follow Up Recommendations  CIR     Equipment Recommendations  Other (comment) (TBA)    Recommendations for Other Services Rehab consult     Precautions / Restrictions Precautions Precautions: Cervical;Fall Precaution Comments: Monitor BP closely with BP control measures (wrapping, SCDs, Ted hose EOB) Required Braces or Orthoses: Cervical Brace Cervical Brace: Hard collar;At all times Restrictions Weight Bearing Restrictions: No    Mobility  Bed Mobility Overal bed mobility: Needs Assistance Bed Mobility: Rolling;Sidelying to Sit Rolling: Max assist;+2 for physical assistance Sidelying to sit: Max assist;+2 for physical assistance       General bed mobility comments: Pt able to hook Rt arm with PT and assist with roll and pull to seated position. Required max assist to facilitate roll and for truncal control while rising to edge of bed.  Transfers Overall transfer level: Needs  assistance Equipment used: Ambulation equipment used             General transfer comment: Maxi-Lift utilized  Ambulation/Gait                 Careers information officer    Modified Rankin (Stroke Patients Only)       Balance     Sitting balance-Leahy Scale: Zero Sitting balance - Comments: Sat EOB with 4/10 dizziness, BP 118/70 HR 57. Tolerated x 5 minutes.                            Cognition Arousal/Alertness: Awake/alert Behavior During Therapy: Flat affect Overall Cognitive Status: Within Functional Limits for tasks assessed                      Exercises General Exercises - Lower Extremity Ankle Circles/Pumps: AAROM;Both;10 reps;Seated (passive on Lt) Heel Slides: AAROM;Both;10 reps;Seated (passive on Lt) Hip ABduction/ADduction: AAROM;Both;10 reps;Seated (Mostly adduction. Passive on Lt) Hip Flexion/Marching: AAROM;Both;10 reps;Seated (Minimal activation Rt, Passive ROM Lt.) Other Exercises Other Exercises: Discussed signs and symptoms of autonomic dysreflexia. To alert RN if symptoms occur.    General Comments General comments (skin integrity, edema, etc.):   Vitals assessed, no TED hose needed. Reports minimal dizziness.   - - HOB elevated 37 degrees BP 111/63, HR 40   - - Sitting EOB BP 118/70 HR 57   - - Slightly reclined in tilt-in-space wheelchair BP 108/66, HR 52.      Pertinent  Vitals/Pain Pain Assessment: No/denies pain Pain Intervention(s): Monitored during session;Repositioned    Home Living                      Prior Function            PT Goals (current goals can now be found in the care plan section) Acute Rehab PT Goals PT Goal Formulation: With patient/family Time For Goal Achievement: 11/29/14 Potential to Achieve Goals: Fair Progress towards PT goals: Progressing toward goals    Frequency  Min 3X/week    PT Plan Current plan remains appropriate    Co-evaluation              End of Session Equipment Utilized During Treatment: Cervical collar Activity Tolerance: Patient tolerated treatment well Patient left: with call bell/phone within reach;with family/visitor present;in chair;Other (comment) (tilt-in-space wheelchair)     Time: 4540-9811 PT Time Calculation (min) (ACUTE ONLY): 38 min  Charges:  $Therapeutic Exercise: 8-22 mins $Therapeutic Activity: 23-37 mins                    G Codes:      Berton Mount 11/24/14, 2:28 PM [place], [place] [phone]

## 2014-11-19 ENCOUNTER — Inpatient Hospital Stay (HOSPITAL_COMMUNITY): Payer: Federal, State, Local not specified - PPO

## 2014-11-19 NOTE — Progress Notes (Signed)
Patient with temp 102.9, reported to Dr Janee Morn. Orders fro a CXray and Urine Cx recd

## 2014-11-19 NOTE — Progress Notes (Signed)
Trauma Service Note  Subjective: Patient doing much better today.  Better spirits.  Objective: Vital signs in last 24 hours: Temp:  [97.7 F (36.5 C)-98.5 F (36.9 C)] 98.3 F (36.8 C) (08/14 0929) Pulse Rate:  [38-74] 74 (08/14 0929) Resp:  [17-20] 18 (08/14 0929) BP: (93-130)/(54-67) 93/60 mmHg (08/14 0929) SpO2:  [91 %-95 %] 95 % (08/14 0929)    Intake/Output from previous day: 08/13 0701 - 08/14 0700 In: 10 [I.V.:10] Out: 2 [Stool:2] Intake/Output this shift:    General: No acute distress.  Sat up for about 2 hours yesterday.  Lungs: Clear  Abd: Soft, BM with Dulcolax suppository yesterday.  Extremities: No changes  Neuro: The same.  Right upper extremity seems stronger today.  Lab Results: CBC   Recent Labs  11/16/14 1048  WBC 10.9*  HGB 10.7*  HCT 31.3*  PLT 177   BMET  Recent Labs  11/16/14 1048  NA 134*  K 4.3  CL 99*  CO2 30  GLUCOSE 132*  BUN 20  CREATININE 1.13  CALCIUM 8.2*   PT/INR No results for input(s): LABPROT, INR in the last 72 hours. ABG No results for input(s): PHART, HCO3 in the last 72 hours.  Invalid input(s): PCO2, PO2  Studies/Results: No results found.  Anti-infectives: Anti-infectives    Start     Dose/Rate Route Frequency Ordered Stop   11/14/14 2300  vancomycin (VANCOCIN) IVPB 1000 mg/200 mL premix     1,000 mg 200 mL/hr over 60 Minutes Intravenous  Once 11/14/14 1712 11/14/14 2353   11/14/14 1228  vancomycin (VANCOCIN) 1 GM/200ML IVPB    Comments:  [name]   : cabinet override      11/14/14 1228 11/15/14 0044      Assessment/Plan: s/p Procedure(s): Cervical Five-Cervical Six  Anterior Cervical Decompression/Diskectomy/Fusion Plan for discharge tomorrow Probably to Rehab soon.  LOS: 10 days   [name]. Gae Bon, MD, FACS [phone] Trauma Surgeon 11/19/2014

## 2014-11-19 NOTE — Progress Notes (Signed)
No issues overnight. No complaints  EXAM:  BP 98/59 mmHg  Pulse 66  Temp(Src) 98.9 F (37.2 C) (Oral)  Resp 20  Ht  (1.727 m)  Wt 90.719 kg (200 lb)  BMI 30.42 kg/m2  SpO2 100%  Awake, alert, oriented  Speech fluent, appropriate  CN grossly intact  Unchanged dense quadriparesis  IMPRESSION:  64 y.o. male s/p fall with spinal cord injury and dense quadriparesis  PLAN: - Aspen collar - Likely CIR placement

## 2014-11-20 ENCOUNTER — Encounter (HOSPITAL_COMMUNITY): Payer: Self-pay | Admitting: Physical Medicine and Rehabilitation

## 2014-11-20 ENCOUNTER — Inpatient Hospital Stay (HOSPITAL_COMMUNITY)
Admit: 2014-11-20 | Discharge: 2014-12-19 | DRG: 949 | Disposition: A | Discharge status: Home / Self Care | Payer: Federal, State, Local not specified - PPO | Source: Intra-hospital | Attending: Physical Medicine & Rehabilitation | Admitting: Physical Medicine & Rehabilitation

## 2014-11-20 ENCOUNTER — Inpatient Hospital Stay (HOSPITAL_COMMUNITY): Payer: Federal, State, Local not specified - PPO

## 2014-11-20 DIAGNOSIS — G8250 Quadriplegia, unspecified: Secondary | ICD-10-CM | POA: Diagnosis not present

## 2014-11-20 DIAGNOSIS — I951 Orthostatic hypotension: Secondary | ICD-10-CM | POA: Diagnosis present

## 2014-11-20 DIAGNOSIS — R42 Dizziness and giddiness: Secondary | ICD-10-CM | POA: Diagnosis present

## 2014-11-20 DIAGNOSIS — S14106S Unspecified injury at C6 level of cervical spinal cord, sequela: Secondary | ICD-10-CM | POA: Diagnosis not present

## 2014-11-20 DIAGNOSIS — K5900 Constipation, unspecified: Secondary | ICD-10-CM

## 2014-11-20 DIAGNOSIS — R509 Fever, unspecified: Secondary | ICD-10-CM | POA: Diagnosis not present

## 2014-11-20 DIAGNOSIS — S12400S Unspecified displaced fracture of fifth cervical vertebra, sequela: Secondary | ICD-10-CM | POA: Diagnosis not present

## 2014-11-20 DIAGNOSIS — N390 Urinary tract infection, site not specified: Secondary | ICD-10-CM | POA: Diagnosis present

## 2014-11-20 DIAGNOSIS — N170 Acute kidney failure with tubular necrosis: Secondary | ICD-10-CM | POA: Diagnosis not present

## 2014-11-20 DIAGNOSIS — B961 Klebsiella pneumoniae [K. pneumoniae] as the cause of diseases classified elsewhere: Secondary | ICD-10-CM | POA: Diagnosis present

## 2014-11-20 DIAGNOSIS — S14155A Other incomplete lesion at C5 level of cervical spinal cord, initial encounter: Secondary | ICD-10-CM | POA: Diagnosis present

## 2014-11-20 DIAGNOSIS — S12400A Unspecified displaced fracture of fifth cervical vertebra, initial encounter for closed fracture: Secondary | ICD-10-CM | POA: Diagnosis present

## 2014-11-20 DIAGNOSIS — E860 Dehydration: Secondary | ICD-10-CM | POA: Diagnosis present

## 2014-11-20 DIAGNOSIS — R319 Hematuria, unspecified: Secondary | ICD-10-CM | POA: Diagnosis not present

## 2014-11-20 DIAGNOSIS — R001 Bradycardia, unspecified: Secondary | ICD-10-CM | POA: Diagnosis present

## 2014-11-20 DIAGNOSIS — K592 Neurogenic bowel, not elsewhere classified: Secondary | ICD-10-CM

## 2014-11-20 DIAGNOSIS — S12500D Unspecified displaced fracture of sixth cervical vertebra, subsequent encounter for fracture with routine healing: Secondary | ICD-10-CM | POA: Diagnosis not present

## 2014-11-20 DIAGNOSIS — S14106D Unspecified injury at C6 level of cervical spinal cord, subsequent encounter: Secondary | ICD-10-CM | POA: Diagnosis not present

## 2014-11-20 DIAGNOSIS — N178 Other acute kidney failure: Secondary | ICD-10-CM | POA: Diagnosis not present

## 2014-11-20 DIAGNOSIS — S12400D Unspecified displaced fracture of fifth cervical vertebra, subsequent encounter for fracture with routine healing: Secondary | ICD-10-CM | POA: Diagnosis not present

## 2014-11-20 DIAGNOSIS — T148 Other injury of unspecified body region: Secondary | ICD-10-CM | POA: Diagnosis present

## 2014-11-20 DIAGNOSIS — G8254 Quadriplegia, C5-C7 incomplete: Secondary | ICD-10-CM | POA: Diagnosis present

## 2014-11-20 DIAGNOSIS — N319 Neuromuscular dysfunction of bladder, unspecified: Secondary | ICD-10-CM | POA: Diagnosis present

## 2014-11-20 DIAGNOSIS — X58XXXD Exposure to other specified factors, subsequent encounter: Secondary | ICD-10-CM | POA: Diagnosis not present

## 2014-11-20 DIAGNOSIS — R55 Syncope and collapse: Secondary | ICD-10-CM | POA: Diagnosis not present

## 2014-11-20 DIAGNOSIS — N179 Acute kidney failure, unspecified: Secondary | ICD-10-CM | POA: Diagnosis present

## 2014-11-20 MED ORDER — ONDANSETRON HCL 4 MG PO TABS: 4.0000 mg | ORAL_TABLET | Freq: Four times a day (QID) | ORAL | Status: DC | PRN

## 2014-11-20 MED ORDER — ALUM & MAG HYDROXIDE-SIMETH 200-200-20 MG/5ML PO SUSP: 30.0000 mL | Freq: Four times a day (QID) | ORAL | Status: DC | PRN

## 2014-11-20 MED ORDER — ALUM & MAG HYDROXIDE-SIMETH 200-200-20 MG/5ML PO SUSP: 30.0000 mL | ORAL | Status: DC | PRN

## 2014-11-20 MED ORDER — GUAIFENESIN-DM 100-10 MG/5ML PO SYRP: 5.0000 mL | ORAL_SOLUTION | Freq: Four times a day (QID) | ORAL | Status: DC | PRN

## 2014-11-20 MED ORDER — PANTOPRAZOLE SODIUM 40 MG PO TBEC
40.0000 mg | DELAYED_RELEASE_TABLET | Freq: Every day | ORAL | Status: DC
  Administered 2014-11-21 – 2014-12-19 (×29): 40 mg via ORAL
  Filled 2014-11-20 (×29): qty 1

## 2014-11-20 MED ORDER — POLYETHYLENE GLYCOL 3350 17 G PO PACK: 17.0000 g | PACK | Freq: Every day | ORAL | Status: DC | PRN

## 2014-11-20 MED ORDER — TRAMADOL HCL 50 MG PO TABS
50.0000 mg | ORAL_TABLET | Freq: Four times a day (QID) | ORAL | Status: DC
  Administered 2014-11-20 – 2014-12-19 (×100): 50 mg via ORAL
  Filled 2014-11-20 (×110): qty 1

## 2014-11-20 MED ORDER — METHOCARBAMOL 500 MG PO TABS: 500.0000 mg | ORAL_TABLET | Freq: Four times a day (QID) | ORAL | Status: DC | PRN

## 2014-11-20 MED ORDER — ONDANSETRON HCL 4 MG/2ML IJ SOLN: 4.0000 mg | Freq: Four times a day (QID) | INTRAMUSCULAR | Status: DC | PRN

## 2014-11-20 MED ORDER — LIDOCAINE HCL 2 % EX GEL
CUTANEOUS | Status: DC | PRN
  Administered 2014-12-04: 5 via TOPICAL
  Filled 2014-11-20 (×3): qty 5

## 2014-11-20 MED ORDER — BISACODYL 10 MG RE SUPP
10.0000 mg | Freq: Every day | RECTAL | Status: DC
  Administered 2014-11-21 – 2014-12-05 (×15): 10 mg via RECTAL
  Filled 2014-11-20 (×15): qty 1

## 2014-11-20 MED ORDER — OXYCODONE HCL 5 MG PO TABS
5.0000 mg | ORAL_TABLET | ORAL | Status: DC | PRN
  Administered 2014-11-22 – 2014-11-23 (×3): 10 mg via ORAL
  Administered 2014-11-24: 5 mg via ORAL
  Filled 2014-11-20: qty 2
  Filled 2014-11-20 (×2): qty 1
  Filled 2014-11-20 (×2): qty 2

## 2014-11-20 MED ORDER — ACETAMINOPHEN 325 MG PO TABS
325.0000 mg | ORAL_TABLET | ORAL | Status: DC | PRN
  Administered 2014-11-21: 650 mg via ORAL
  Filled 2014-11-20 (×2): qty 2

## 2014-11-20 MED ORDER — PREGABALIN 75 MG PO CAPS
75.0000 mg | ORAL_CAPSULE | Freq: Two times a day (BID) | ORAL | Status: DC
  Administered 2014-11-20 – 2014-12-05 (×30): 75 mg via ORAL
  Filled 2014-11-20 (×30): qty 1

## 2014-11-20 MED ORDER — SODIUM CHLORIDE 0.9 % IJ SOLN
10.0000 mL | INTRAMUSCULAR | Status: DC | PRN
  Administered 2014-11-20 – 2014-11-23 (×3): 10 mL
  Administered 2014-11-25 – 2014-11-27 (×2): 20 mL
  Filled 2014-11-20 (×4): qty 40

## 2014-11-20 MED ORDER — FLEET ENEMA 7-19 GM/118ML RE ENEM: 1.0000 | ENEMA | Freq: Once | RECTAL | Status: DC | PRN

## 2014-11-20 MED ORDER — ASPIRIN EC 81 MG PO TBEC
81.0000 mg | DELAYED_RELEASE_TABLET | Freq: Every day | ORAL | Status: DC
  Administered 2014-11-21 – 2014-12-19 (×29): 81 mg via ORAL
  Filled 2014-11-20 (×30): qty 1

## 2014-11-20 MED ORDER — MENTHOL 3 MG MT LOZG: 1.0000 | LOZENGE | OROMUCOSAL | Status: DC | PRN

## 2014-11-20 MED ORDER — PHENOL 1.4 % MT LIQD: 1.0000 | OROMUCOSAL | Status: DC | PRN

## 2014-11-20 MED ORDER — BACITRACIN-POLYMYXIN B 500-10000 UNIT/GM OP OINT
TOPICAL_OINTMENT | Freq: Two times a day (BID) | OPHTHALMIC | Status: DC
  Administered 2014-11-20 – 2014-11-22 (×5): via OPHTHALMIC
  Administered 2014-11-23: 1 via OPHTHALMIC
  Administered 2014-11-23 – 2014-11-25 (×5): via OPHTHALMIC
  Administered 2014-11-26: 1 via OPHTHALMIC
  Administered 2014-11-26 – 2014-12-01 (×11): via OPHTHALMIC
  Filled 2014-11-20 (×2): qty 3.5

## 2014-11-20 MED ORDER — ENOXAPARIN SODIUM 40 MG/0.4ML ~~LOC~~ SOLN
40.0000 mg | SUBCUTANEOUS | Status: DC
  Filled 2014-11-20: qty 0.4

## 2014-11-20 MED ORDER — DIPHENHYDRAMINE HCL 12.5 MG/5ML PO ELIX: 12.5000 mg | ORAL_SOLUTION | Freq: Four times a day (QID) | ORAL | Status: DC | PRN

## 2014-11-20 MED ORDER — PROCHLORPERAZINE MALEATE 5 MG PO TABS: 5.0000 mg | ORAL_TABLET | Freq: Four times a day (QID) | ORAL | Status: DC | PRN

## 2014-11-20 MED ORDER — TRAZODONE HCL 50 MG PO TABS
25.0000 mg | ORAL_TABLET | Freq: Every evening | ORAL | Status: DC | PRN
  Filled 2014-11-20: qty 1

## 2014-11-20 MED ORDER — PROCHLORPERAZINE EDISYLATE 5 MG/ML IJ SOLN: 5.0000 mg | Freq: Four times a day (QID) | INTRAMUSCULAR | Status: DC | PRN

## 2014-11-20 MED ORDER — PROCHLORPERAZINE 25 MG RE SUPP: 12.5000 mg | Freq: Four times a day (QID) | RECTAL | Status: DC | PRN

## 2014-11-20 NOTE — Progress Notes (Signed)
Subjective: Patient reports "I think I go to Rehab today"  Objective: Vital signs in last 24 hours: Temp:  [98.3 F (36.8 C)-102.9 F (39.4 C)] 98.5 F (36.9 C) (08/15 0102) Pulse Rate:  [49-74] 57 (08/15 0102) Resp:  [18-20] 18 (08/15 0102) BP: (93-109)/(42-60) 109/46 mmHg (08/15 0102) SpO2:  [90 %-100 %] 97 % (08/15 0102)  Intake/Output from previous day:   Intake/Output this shift:    Alert, conversant, in better spirits today. Weakly rotates RLE, no LLE movement. Requires max assist with meals, but looks forward to continued therapies.  Vista Collar in use. Incision flat without erythema,or drainage beneath honeycomb and Dermabond.    Lab Results: No results for input(s): WBC, HGB, HCT, PLT in the last 72 hours. BMET No results for input(s): NA, K, CL, CO2, GLUCOSE, BUN, CREATININE, CALCIUM in the last 72 hours.  Studies/Results: Dg Chest Port 1 View  11/19/2014   CLINICAL DATA:  Fever, fall, spinal cord injury, quadriparesis  EXAM: PORTABLE CHEST - 1 VIEW  COMPARISON:  11/16/2014  FINDINGS: Lungs are essentially clear. No focal consolidation. No pleural effusion or pneumothorax.  The heart is top-normal in size.  Left arm PICC terminates in the mid SVC.  Cervical spine fixation hardware.  IMPRESSION: No evidence of acute cardiopulmonary disease.  Left arm PICC terminates in the mid SVC.   Electronically Signed   By: Charline Bills M.D.   On: 11/19/2014 18:33    Assessment/Plan:    LOS: 11 days  Hopeful of CIR.  Ok to remove honeycomb drsg and wash incision with bath over Dermabond.    Georgiann Cocker 11/20/2014, 7:54 AM

## 2014-11-20 NOTE — H&P (View-Only) (Signed)
  Physical Medicine and Rehabilitation Admission H&P    Chief Complaint  Patient presents with  . 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    HPI: Carl [name] is a 64 y.o. male who fell from the roof while painting hsi house with complaints of neck pain and inability to move BLE wiand weakness BUE. Patient hypotensive at admission requiring dopamine and GCS 15. Bedside ultrasound negative and patient with poor air movement with poor respiratory effort. Work up revealed comminuted left C5 and C6 fractures with prevertebral and paraspinal edema, extensive cervical strain and cervical cord contusion with swelling extending C5-C6 with intramedullary hematoma, nondisplaced right 9th and 10th ribs and right eyelid laceration. Patient with numbness BLE as well as numbness/pain in chest and abdominal wall. Right brow lacerations repaired by Dr. Lyles and no signs of globe injury noted. He was placed on bedrest with Aspen collar in place and underwent ACDF with PEEk cage by Dr. Stern on 08/09. He has had bradycardia with hypotension due to neurogenic shock and was started on steriods with improvement in symptoms. Had fever 102.9 last pm and CXR negative for infection. Urine culture ordered and pending.  Therapy ongoing and patient showing improvement in tolerance of activity at EOB.  CIR recommended by MD and rehab team and patient admitted today.    Review of Systems  HENT: Negative for hearing loss.   Eyes: Negative for blurred vision and double vision.  Respiratory: Negative for cough and shortness of breath.   Cardiovascular: Positive for chest pain (due to "tightness in core").  Gastrointestinal: Positive for constipation. Negative for nausea, vomiting and abdominal pain.  Genitourinary:       Hesitancy--uses saw Palmetto with minimal relief of symptoms.    Musculoskeletal: Positive for myalgias and neck pain.  Skin: Negative for itching and rash.  Neurological: Positive for dizziness (when up), tingling,  sensory change and focal weakness. Negative for headaches.  Psychiatric/Behavioral: Positive for depression (some but dealing with it). The patient is nervous/anxious.      Past Medical History  Diagnosis Date  . Bradycardia   . Chronic abdominal pain   . Hesitancy of micturition       Past Surgical History  Procedure Laterality Date  . Anterior cervical decomp/discectomy fusion N/A 11/14/2014    Procedure: Cervical Five-Cervical Six  Anterior Cervical Decompression/Diskectomy/Fusion;  Surgeon: Joseph Stern, MD;  Location: [facility];  Service: Neurosurgery;  Laterality: N/A;    Family History  Problem Relation Age of Onset  . Heart attack Father   . Alzheimer's disease Mother   . Aneurysm Brother     brain    Social History: Married. 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 He does not use tobacco, alcohol, or illicit drugs.   Allergies  Allergen Reactions  . Penicillins Hives    Medications Prior to Admission  Medication Sig Dispense Refill  . aspirin EC 81 MG tablet Take 81 mg by mouth daily.    . ibuprofen (ADVIL,MOTRIN) 200 MG tablet Take 200 mg by mouth every 6 (six) hours as needed for headache, mild pain or moderate pain.    . Saw Palmetto, Serenoa repens, (SAW PALMETTO PO) Take 1 tablet by mouth daily.      Home: Home Living Family/patient expects to be discharged to:: Inpatient rehab Living Arrangements: Spouse/significant other Available Help at Discharge: Family Type of Home: House Home Access: Stairs to enter Entrance Stairs-Number of Steps: 6 Entrance Stairs-Rails: Left, Right Home Layout: Multi-level, Full bath on main level,   Able to live on main level with bedroom/bathroom Alternate Level Stairs-Number of Steps: don't go up to second level, has a basement Bathroom Shower/Tub: Tub only Bathroom Toilet: Standard Home Equipment: None   Functional History: Prior Function Level of Independence: Independent Comments: pastors a church in  [place]  Functional Status:  Mobility: Bed Mobility Overal bed mobility: Needs Assistance Bed Mobility: Rolling, Sidelying to Sit Rolling: Max assist, +2 for physical assistance Sidelying to sit: Max assist, +2 for physical assistance Supine to sit: +2 for physical assistance, Total assist, HOB elevated Sit to sidelying: Total assist, +2 for physical assistance General bed mobility comments: Pt able to hook Rt arm with PT and assist with roll and pull to seated position. Required max assist to facilitate roll and for truncal control while rising to edge of bed. Transfers Overall transfer level: Needs assistance Equipment used: Ambulation equipment used General transfer comment: Maxi-Lift utilized      ADL: ADL Overall ADL's : Needs assistance/impaired Eating/Feeding: Minimal assistance Eating/Feeding Details (indicate cue type and reason): Using lidded mug and universal cuff, pt was able to feed self with minA to guide Rt UE initially, and to prevent dropping of cup while drinking from cup.   Grooming: Wash/dry face, Minimal assistance, Bed level Grooming Details (indicate cue type and reason): using Rt UE  Upper Body Bathing: Total assistance, Bed level Lower Body Bathing: Total assistance, Bed level Upper Body Dressing : Total assistance, Bed level Lower Body Dressing: Total assistance, Bed level Toilet Transfer: Total assistance Toileting- Clothing Manipulation and Hygiene: Total assistance, Bed level General ADL Comments: Session focused on EOB sitting balance for trunk strengthening and use of BIL UE for support. Pt tolerated EOB with SCD for BP support. Pt with decr SBP > 20 points with EOB. Pt  Cognition: Cognition Overall Cognitive Status: Within Functional Limits for tasks assessed Orientation Level: Oriented X4 Cognition Arousal/Alertness: Awake/alert Behavior During Therapy: Flat affect Overall Cognitive Status: Within Functional Limits for tasks  assessed  Physical Exam: Blood pressure 107/49, pulse 64, temperature 99.3 F (37.4 C), temperature source Oral, resp. rate 18, height 5' 8" (1.727 m), weight 90.719 kg (200 lb), SpO2 96 %. Physical Exam  Nursing note and vitals reviewed. Constitutional: He is oriented to person, place, and time. He appears well-developed and well-nourished.  HENT:  Head: Normocephalic and atraumatic.  Healing abrasion on face/eyelid  Eyes: Conjunctivae are normal. Pupils are equal, round, and reactive to light.  Neck:  Immobilized by Collar.   Cardiovascular: Normal rate and regular rhythm.   Respiratory: Effort normal. No respiratory distress. He has no wheezes.  GI: Soft. He exhibits distension. Bowel sounds are decreased. There is tenderness.  Genitourinary:  Condom cath with dark brown urine.   Musculoskeletal: He exhibits tenderness. He exhibits no edema.  Neurological: He is alert and oriented to person, place, and time. No cranial nerve deficit. Coordination normal.  Patient has normal sensation proximal to C6 dermatome. Patient has reduced sensation to pinprick but is able to identify the area touched At bilateral C6 through sacral levels Patient has decreased tone in bilateral Lower extremities 4/5 bilateral deltoids, biceps, 4- triceps, 4 wrist extensors, trace right HI, 1+ left HI, right HF, KE and ADF/APF are 1/5. 0/5 left hip flexors and knee extensors ankle dorsiflexors and plantar flexors  Skin: Skin is warm and dry.  Cervical incision clean and intact    No results found for this or any previous visit (from the past 48 hour(s)). Dg Chest Port 1   View  11/19/2014   CLINICAL DATA:  Fever, fall, spinal cord injury, quadriparesis  EXAM: PORTABLE CHEST - 1 VIEW  COMPARISON:  11/16/2014  FINDINGS: Lungs are essentially clear. No focal consolidation. No pleural effusion or pneumothorax.  The heart is top-normal in size.  Left arm PICC terminates in the mid SVC.  Cervical spine fixation  hardware.  IMPRESSION: No evidence of acute cardiopulmonary disease.  Left arm PICC terminates in the mid SVC.   Electronically Signed   By: Sriyesh  Krishnan M.D.   On: 11/19/2014 18:33       Medical Problem List and Plan: 1. Functional deficits secondary to  C6 ASIA C incomplete tetraplegia, spinal cord injury due to fall with C5-C6 fracture and neurogenic bowel and bladder 2.  DVT Prophylaxis/Anticoagulation: Pharmaceutical: Lovenox.  3. Pain Management: Continue Lyrica BID for neuropathic pain. 4. Mood: Team to provide ego support. LCSW to follow for evaluation and support. Does fell that any medications warranted at this time.  5. Neuropsych: This patient is capable of making decisions on his own behalf. 6. Skin/Wound Care: Routine pressure relief measures.   7. Fluids/Electrolytes/Nutrition: D/C IVF.  Off steroids but may require florinef to help maintain BP. Add abdominal binder and continue wrapping BLE.   8. FUO: Will check dopplers to rule out DVT.  Encourage IS.  Blood cultures if patient spikes again.  Monitor wound for signs of infection.      Post Admission Physician Evaluation: 1. Functional deficits secondary  to C6 ASIA C incomplete tetraplegia, spinal cord injury due to fall with C5-C6 fracture and neurogenic bowel and bladder 2. Patient is admitted to receive collaborative, interdisciplinary care between the physiatrist, rehab nursing staff, and therapy team. 3. Patient's level of medical complexity and substantial therapy needs in context of that medical necessity cannot be provided at a lesser intensity of care such as a SNF. 4. Patient has experienced substantial functional loss from his/her baseline which was documented above under the "Functional History" and "Functional Status" headings.  Judging by the patient's diagnosis, physical exam, and functional history, the patient has potential for functional progress which will result in measurable gains while on inpatient  rehab.  These gains will be of substantial and practical use upon discharge  in facilitating mobility and self-care at the household level. 5. Physiatrist will provide 24 hour management of medical needs as well as oversight of the therapy plan/treatment and provide guidance as appropriate regarding the interaction of the two. 6. 24 hour rehab nursing will assist with bladder management, bowel management, safety, skin/wound care, disease management, medication administration, pain management and patient education  and help integrate therapy concepts, techniques,education, etc. 7. PT will assess and treat for/with: Lower extremity strength, range of motion, stamina, balance, functional mobility, safety, adaptive techniques and equipment, NMR, tone mgt, pain control, SCI education, ego support.   Goals are: supervision to mod assist. 8. OT will assess and treat for/with: ADL's, functional mobility, safety, upper extremity strength, adaptive techniques and equipment, NMR, ROM, pain control, SCI education, orthotics.   Goals are: supervision to max assist. Therapy may not yet proceed with showering this patient. 9. SLP will assess and treat for/with: swallowing, communication.  Goals are: mod I. 10. Case Management and Social Worker will assess and treat for psychological issues and discharge planning. 11. Team conference will be held weekly to assess progress toward goals and to determine barriers to discharge. 12. Patient will receive at least 3 hours of therapy per day at least   5 days per week. 13. ELOS: 20-28 days       14. Prognosis:  excellent     Zachary T. Swartz, MD, FAAPMR [facility] 11/20/2014   11/20/2014 

## 2014-11-20 NOTE — H&P (Signed)
Physical Medicine and Rehabilitation Admission H&P    Chief Complaint  Patient presents with  . 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    HPI: Carl [name] is a 64 y.o. male who fell from the roof while painting hsi house with complaints of neck pain and inability to move BLE wiand weakness BUE. Patient hypotensive at admission requiring dopamine and GCS 15. Bedside ultrasound negative and patient with poor air movement with poor respiratory effort. Work up revealed comminuted left C5 and C6 fractures with prevertebral and paraspinal edema, extensive cervical strain and cervical cord contusion with swelling extending C5-C6 with intramedullary hematoma, nondisplaced right 9th and 10th ribs and right eyelid laceration. Patient with numbness BLE as well as numbness/pain in chest and abdominal wall. Right brow lacerations repaired by Dr. Randon Goldsmith and no signs of globe injury noted. He was placed on bedrest with Aspen collar in place and underwent ACDF with PEEk cage by Dr. Venetia Maxon on 08/09. He has had bradycardia with hypotension due to neurogenic shock and was started on steriods with improvement in symptoms. Had fever 102.9 last pm and CXR negative for infection. Urine culture ordered and pending.  Therapy ongoing and patient showing improvement in tolerance of activity at EOB.  CIR recommended by MD and rehab team and patient admitted today.    Review of Systems  HENT: Negative for hearing loss.   Eyes: Negative for blurred vision and double vision.  Respiratory: Negative for cough and shortness of breath.   Cardiovascular: Positive for chest pain (due to "tightness in core").  Gastrointestinal: Positive for constipation. Negative for nausea, vomiting and abdominal pain.  Genitourinary:       Hesitancy--uses saw Palmetto with minimal relief of symptoms.    Musculoskeletal: Positive for myalgias and neck pain.  Skin: Negative for itching and rash.  Neurological: Positive for dizziness (when up), tingling,  sensory change and focal weakness. Negative for headaches.  Psychiatric/Behavioral: Positive for depression (some but dealing with it). The patient is nervous/anxious.      Past Medical History  Diagnosis Date  . Bradycardia   . Chronic abdominal pain   . Hesitancy of micturition       Past Surgical History  Procedure Laterality Date  . Anterior cervical decomp/discectomy fusion N/A 11/14/2014    Procedure: Cervical Five-Cervical Six  Anterior Cervical Decompression/Diskectomy/Fusion;  Surgeon: Maeola Harman, MD;  Location: [facility];  Service: Neurosurgery;  Laterality: N/A;    Family History  Problem Relation Age of Onset  . Heart attack Father   . Alzheimer's disease Mother   . Aneurysm Brother     brain    Social History: Married. Independent and working as a Scientist, research (life sciences). He does not use tobacco, alcohol, or illicit drugs.   Allergies  Allergen Reactions  . Penicillins Hives    Medications Prior to Admission  Medication Sig Dispense Refill  . aspirin EC 81 MG tablet Take 81 mg by mouth daily.    Marland Kitchen ibuprofen (ADVIL,MOTRIN) 200 MG tablet Take 200 mg by mouth every 6 (six) hours as needed for headache, mild pain or moderate pain.    . Saw Palmetto, Serenoa repens, (SAW PALMETTO PO) Take 1 tablet by mouth daily.      Home: Home Living Family/patient expects to be discharged to:: Inpatient rehab Living Arrangements: Spouse/significant other Available Help at Discharge: Family Type of Home: House Home Access: Stairs to enter [employer] of Steps: 6 Entrance Stairs-Rails: Left, Right Home Layout: Multi-level, Full bath on main level,  Able to live on main level with bedroom/bathroom Alternate Level Stairs-Number of Steps: don't go up to second level, has a basement Bathroom Shower/Tub: Tub only Firefighter: Standard Home Equipment: None   Functional History: Prior Function Level of Independence: Independent Comments: pastors a church in  [place]  Functional Status:  Mobility: Bed Mobility Overal bed mobility: Needs Assistance Bed Mobility: Rolling, Sidelying to Sit Rolling: Max assist, +2 for physical assistance Sidelying to sit: Max assist, +2 for physical assistance Supine to sit: +2 for physical assistance, Total assist, HOB elevated Sit to sidelying: Total assist, +2 for physical assistance General bed mobility comments: Pt able to hook Rt arm with PT and assist with roll and pull to seated position. Required max assist to facilitate roll and for truncal control while rising to edge of bed. Transfers Overall transfer level: Needs assistance Equipment used: Ambulation equipment used General transfer comment: Maxi-Lift utilized      ADL: ADL Overall ADL's : Needs assistance/impaired Eating/Feeding: Minimal assistance Eating/Feeding Details (indicate cue type and reason): Using lidded mug and universal cuff, pt was able to feed self with minA to guide Rt UE initially, and to prevent dropping of cup while drinking from cup.   Grooming: Wash/dry face, Minimal assistance, Bed level Grooming Details (indicate cue type and reason): using Rt UE  Upper Body Bathing: Total assistance, Bed level Lower Body Bathing: Total assistance, Bed level Upper Body Dressing : Total assistance, Bed level Lower Body Dressing: Total assistance, Bed level Toilet Transfer: Total assistance Toileting- Clothing Manipulation and Hygiene: Total assistance, Bed level General ADL Comments: Session focused on EOB sitting balance for trunk strengthening and use of BIL UE for support. Pt tolerated EOB with SCD for BP support. Pt with decr SBP > 20 points with EOB. Pt  Cognition: Cognition Overall Cognitive Status: Within Functional Limits for tasks assessed Orientation Level: Oriented X4 Cognition Arousal/Alertness: Awake/alert Behavior During Therapy: Flat affect Overall Cognitive Status: Within Functional Limits for tasks  assessed  Physical Exam: Blood pressure 107/49, pulse 64, temperature 99.3 F (37.4 C), temperature source Oral, resp. rate 18, height 5\' 8"  (1.727 m), weight 90.719 kg (200 lb), SpO2 96 %. Physical Exam  Nursing note and vitals reviewed. Constitutional: He is oriented to person, place, and time. He appears well-developed and well-nourished.  HENT:  Head: Normocephalic and atraumatic.  Healing abrasion on face/eyelid  Eyes: Conjunctivae are normal. Pupils are equal, round, and reactive to light.  Neck:  Immobilized by Collar.   Cardiovascular: Normal rate and regular rhythm.   Respiratory: Effort normal. No respiratory distress. He has no wheezes.  GI: Soft. He exhibits distension. Bowel sounds are decreased. There is tenderness.  Genitourinary:  Condom cath with dark brown urine.   Musculoskeletal: He exhibits tenderness. He exhibits no edema.  Neurological: He is alert and oriented to person, place, and time. No cranial nerve deficit. Coordination normal.  Patient has normal sensation proximal to C6 dermatome. Patient has reduced sensation to pinprick but is able to identify the area touched At bilateral C6 through sacral levels Patient has decreased tone in bilateral Lower extremities 4/5 bilateral deltoids, biceps, 4- triceps, 4 wrist extensors, trace right HI, 1+ left HI, right HF, KE and ADF/APF are 1/5. 0/5 left hip flexors and knee extensors ankle dorsiflexors and plantar flexors  Skin: Skin is warm and dry.  Cervical incision clean and intact    No results found for this or any previous visit (from the past 48 hour(s)). Dg Chest Port 1  View  11/19/2014   CLINICAL DATA:  Fever, fall, spinal cord injury, quadriparesis  EXAM: PORTABLE CHEST - 1 VIEW  COMPARISON:  11/16/2014  FINDINGS: Lungs are essentially clear. No focal consolidation. No pleural effusion or pneumothorax.  The heart is top-normal in size.  Left arm PICC terminates in the mid SVC.  Cervical spine fixation  hardware.  IMPRESSION: No evidence of acute cardiopulmonary disease.  Left arm PICC terminates in the mid SVC.   Electronically Signed   By: Charline Bills M.D.   On: 11/19/2014 18:33       Medical Problem List and Plan: 1. Functional deficits secondary to  C6 ASIA C incomplete tetraplegia, spinal cord injury due to fall with C5-C6 fracture and neurogenic bowel and bladder 2.  DVT Prophylaxis/Anticoagulation: Pharmaceutical: Lovenox.  3. Pain Management: Continue Lyrica BID for neuropathic pain. 4. Mood: Team to provide ego support. LCSW to follow for evaluation and support. Does fell that any medications warranted at this time.  5. Neuropsych: This patient is capable of making decisions on his own behalf. 6. Skin/Wound Care: Routine pressure relief measures.   7. Fluids/Electrolytes/Nutrition: D/C IVF.  Off steroids but may require florinef to help maintain BP. Add abdominal binder and continue wrapping BLE.   8. FUO: Will check dopplers to rule out DVT.  Encourage IS.  Blood cultures if patient spikes again.  Monitor wound for signs of infection.      Post Admission Physician Evaluation: 1. Functional deficits secondary  to C6 ASIA C incomplete tetraplegia, spinal cord injury due to fall with C5-C6 fracture and neurogenic bowel and bladder 2. Patient is admitted to receive collaborative, interdisciplinary care between the physiatrist, rehab nursing staff, and therapy team. 3. Patient's level of medical complexity and substantial therapy needs in context of that medical necessity cannot be provided at a lesser intensity of care such as a SNF. 4. Patient has experienced substantial functional loss from his/her baseline which was documented above under the "Functional History" and "Functional Status" headings.  Judging by the patient's diagnosis, physical exam, and functional history, the patient has potential for functional progress which will result in measurable gains while on inpatient  rehab.  These gains will be of substantial and practical use upon discharge  in facilitating mobility and self-care at the household level. 5. Physiatrist will provide 24 hour management of medical needs as well as oversight of the therapy plan/treatment and provide guidance as appropriate regarding the interaction of the two. 6. 24 hour rehab nursing will assist with bladder management, bowel management, safety, skin/wound care, disease management, medication administration, pain management and patient education  and help integrate therapy concepts, techniques,education, etc. 7. PT will assess and treat for/with: Lower extremity strength, range of motion, stamina, balance, functional mobility, safety, adaptive techniques and equipment, NMR, tone mgt, pain control, SCI education, ego support.   Goals are: supervision to mod assist. 8. OT will assess and treat for/with: ADL's, functional mobility, safety, upper extremity strength, adaptive techniques and equipment, NMR, ROM, pain control, SCI education, orthotics.   Goals are: supervision to max assist. Therapy may not yet proceed with showering this patient. 9. SLP will assess and treat for/with: swallowing, communication.  Goals are: mod I. 10. Case Management and Social Worker will assess and treat for psychological issues and discharge planning. 11. Team conference will be held weekly to assess progress toward goals and to determine barriers to discharge. 12. Patient will receive at least 3 hours of therapy per day at least  5 days per week. 13. ELOS: 20-28 days       14. Prognosis:  excellent     Ranelle Oyster, MD, [facility] Physical Medicine & Rehabilitation 11/20/2014   11/20/2014

## 2014-11-20 NOTE — Progress Notes (Signed)
Occupational Therapy Treatment Patient Details Name: Carl [name] MRN: [id number] DOB: 1950/07/14 Today's Date: 11/20/2014    History of present illness Blaire was painting a house and was up on the roof. He fell onto a lower roof and then fell again onto the ground. He struck his head during the fall. Afterwards, he was unable to move his legs. He was transported as a level II trauma but upgraded to level I in transport due to hypotension. On arrival he complains of inability to move his legs and that his trunk and arms feel tingly.  S/p ACDF C5-C6 11/14/14.   OT comments  Pt demonstrates EOB static sitting this session. Pt progressing toward goals but requires SCD for BP control at EOB. Supine BP 120/52 EOB 112/52 Pt very motivated to continue therapy sessions.  MD PLEASE ORDER PRAFO boots   RN staff place incr HOB in the AM and transfer to tilt in space w/c daily to help progress patient. Pt will require change of position every 30 minutes while in chair.    Follow Up Recommendations  CIR;Supervision/Assistance - 24 hour    Equipment Recommendations  None recommended by OT    Recommendations for Other Services Rehab consult    Precautions / Restrictions Precautions Precautions: Cervical;Fall Precaution Comments: Monitor BP closely with BP control measures (wrapping, SCDs, Ted hose EOB) Required Braces or Orthoses: Cervical Brace Cervical Brace: Hard collar;At all times       Mobility Bed Mobility Overal bed mobility: +2 for physical assistance;Needs Assistance Bed Mobility: Supine to Sit;Sit to Supine Rolling: +2 for physical assistance;Max assist Sidelying to sit: +2 for physical assistance;Max assist       General bed mobility comments: pt initiating with BIL UE   Transfers                 General transfer comment: lift pad placed for RN staff to hoyer lift to chair    Balance       Sitting balance - Comments: pt static sitting for ~1 minute with BIL UE at  eob without support at min guard (A)                            ADL Overall ADL's : Needs assistance/impaired                                       General ADL Comments: Session focused on EOB sitting balance for trunk strengthening and use of BIL UE for support. Pt tolerated EOB with SCD for BP support. Pt with decr SBP > 20 points with EOB. Pt provide a new univserval cuff to help with self feeding and grooming       Vision                     Perception     Praxis      Cognition   Behavior During Therapy: Flat affect Overall Cognitive Status: Within Functional Limits for tasks assessed                       Extremity/Trunk Assessment               Exercises     Shoulder Instructions       General Comments      Pertinent Vitals/ Pain  Pain Assessment: No/denies pain  Home Living                                          Prior Functioning/Environment              Frequency Min 2X/week     Progress Toward Goals  OT Goals(current goals can now be found in the care plan section)  Progress towards OT goals: Progressing toward goals  Acute Rehab OT Goals Patient Stated Goal: to get better OT Goal Formulation: With patient Time For Goal Achievement: 11/29/14 Potential to Achieve Goals: Good ADL Goals Pt Will Perform Eating: with min assist;with adaptive utensils;sitting;bed level Pt Will Perform Grooming: with mod assist;with adaptive equipment;sitting;bed level Pt Will Perform Upper Body Bathing: with mod assist;sitting;bed level Additional ADL Goal #1: Pt will be able to tolerate circle sitting x 15 mins with min guard assist in prep for ADLs Additional ADL Goal #2: Pt/family will be independent with HEP for bil. UE ROM and strengthening.   Plan Discharge plan remains appropriate    Co-evaluation                 End of Session Equipment Utilized During Treatment: Cervical  collar   Activity Tolerance Patient tolerated treatment well   Patient Left in bed;with call bell/phone within reach;with family/visitor present;with nursing/sitter in room   Nurse Communication Mobility status        Time: 1478-2956 OT Time Calculation (min): 39 min  Charges: OT General Charges $OT Visit: 1 Procedure OT Treatments $Self Care/Home Management : 8-22 mins $Therapeutic Activity: 8-22 mins  Boone Master B 11/20/2014, 2:30 PM  Mateo Flow   OTR/L Pager: [phone] Office: [phone] .

## 2014-11-20 NOTE — Progress Notes (Signed)
Erick Colace, MD Physician Signed Physical Medicine and Rehabilitation Consult Note 11/15/2014 2:37 PM  Related encounter: ED to Hosp-Admission (Current) from 11/09/2014 in [facility]    Expand All Collapse All        Physical Medicine and Rehabilitation Consult   Reason for Consult: Central cord syndrome with quadriparesis  Referring Physician: Dr. Venetia Maxon.    HPI: Carl [name] is a 64 y.o. male who fell from the roof while painting hsi house with complaints of neck pain and inability to move BLE wiand weakness BUE. Patient hypotensive at admission requiring dopamine and GCS 15. Bedside ultrasound negative and patient with poor air movement with poor respiratory effort. Work up revealed comminuted left C5 and C6 fractures with prevertebral and paraspinal edema, extensive cervical strain and cervical cord contusion with swelling extending C5-C6 with intramedullary hematoma, nondisplaced right 9th and 10th ribs and right eyelid laceration. Patient with numbness BLE as well as numbness/pain in chest and abdominal wall. Right brow lacerations repaired by Dr. Randon Goldsmith and no signs of globe injury noted. He was placed on bedrest with Aspen collar in place and underwent ACDF with PEEk cage by Dr. Venetia Maxon on 08/09. PT/OT evaluations to be done today and CIR consulted in anticipation of extensive rehab needs.   Patient feels like he has gotten some sensation back over the last several days. Still feels numb in the hands as well as feet and legs. Still has Foley, no bowel movement since admission. Review of Systems  HENT: Negative for hearing loss.  Eyes: Negative for blurred vision and double vision.  Respiratory: Negative for cough and shortness of breath.  Cardiovascular: Negative for chest pain and palpitations.  Gastrointestinal: Positive for abdominal pain (chronic for years with "negative" workup) and constipation.  Genitourinary: Positive for  urgency (uses Saw Palmetto) and frequency.  Musculoskeletal: Positive for myalgias and back pain.  Neurological: Positive for sensory change and focal weakness. Negative for headaches. Tingling: burning/stinging BUE.  Psychiatric/Behavioral: Positive for depression. The patient has insomnia.     History reviewed. No pertinent past medical history.    Past Surgical History  Procedure Laterality Date  . Anterior cervical decomp/discectomy fusion N/A 11/14/2014    Procedure: Cervical Five-Cervical Six Anterior Cervical Decompression/Diskectomy/Fusion; Surgeon: Maeola Harman, MD; Location: [facility]; Service: Neurosurgery; Laterality: N/A;      Family History  Problem Relation Age of Onset  . Heart attack Father   . Alzheimer's disease Mother   . Aneurysm Brother     brain      Social History: Married. Independent and working as a Scientist, research (life sciences). He does not use tobacco, alcohol, or illicit drugs.   Allergies  Allergen Reactions  . Penicillins Hives    Medications Prior to Admission  Medication Sig Dispense Refill  . aspirin EC 81 MG tablet Take 81 mg by mouth daily.    Marland Kitchen ibuprofen (ADVIL,MOTRIN) 200 MG tablet Take 200 mg by mouth every 6 (six) hours as needed for headache, mild pain or moderate pain.    . Saw Palmetto, Serenoa repens, (SAW PALMETTO PO) Take 1 tablet by mouth daily.      Home: Home Living Family/patient expects to be discharged to:: Inpatient rehab Living Arrangements: Alone Available Help at Discharge: Family Type of Home: House Home Access: Stairs to enter [employer] of Steps: 6 Entrance Stairs-Rails: Left, Right Home Layout: Multi-level, Full bath on main level, Able to live on main level with bedroom/bathroom Alternate Level Stairs-Number of  Steps: don't go up to second level, has a basement Bathroom Shower/Tub: Tub only (on main level) Bathroom Toilet: Standard  (a little higher than standard, but not handicapped) Home Equipment: None  Functional History: Prior Function Level of Independence: Independent Comments: pastors a church in [place] Functional Status:  Mobility: Bed Mobility Overal bed mobility: Needs Assistance Bed Mobility: Supine to Sit, Sit to Sidelying Supine to sit: +2 for physical assistance, Total assist, HOB elevated Sit to sidelying: Total assist, +2 for physical assistance General bed mobility comments: assist for trunk and LE's Transfers General transfer comment: deferred due to patient 9/10 pain sitting EOB      ADL: ADL Overall ADL's : Needs assistance/impaired Eating/Feeding: Total assistance, Bed level Grooming: Wash/dry face, Minimal assistance, Bed level Grooming Details (indicate cue type and reason): using Rt UE  Upper Body Bathing: Total assistance, Bed level Lower Body Bathing: Total assistance, Bed level Upper Body Dressing : Total assistance, Bed level Lower Body Dressing: Total assistance, Bed level Toilet Transfer: Total assistance Toileting- Clothing Manipulation and Hygiene: Total assistance, Bed level  Cognition: Cognition Overall Cognitive Status: Within Functional Limits for tasks assessed Orientation Level: Oriented X4 Cognition Arousal/Alertness: Awake/alert Behavior During Therapy: WFL for tasks assessed/performed Overall Cognitive Status: Within Functional Limits for tasks assessed  Blood pressure 142/46, pulse 50, temperature 99.8 F (37.7 C), temperature source Oral, resp. rate 11, height 5\' 8"  (1.727 m), weight 88.451 kg (195 lb), SpO2 93 %. Physical Exam  Nursing note and vitals reviewed. Constitutional: He is oriented to person, place, and time. He appears well-developed and well-nourished.  HENT:  Head: Normocephalic and atraumatic.  Eyes: Conjunctivae are normal. Pupils are equal, round, and reactive to light.  Neck:  Immobilized by cervical collar  Cardiovascular:  Normal rate and regular rhythm.  Respiratory: Effort normal and breath sounds normal. No respiratory distress. He has no wheezes.  GI: Normal appearance. He exhibits distension. Bowel sounds are decreased. There is tenderness.  Musculoskeletal: He exhibits edema (1+ pedal edema bilaterally. ).  Neurological: He is alert and oriented to person, place, and time.  Speech clear. Sensory deficits BUE/BLE with reports of of tingling sensation in torso and abdominal area. Quadriplegia with dense paresis BLE and bilateral foot drop.  Skin: Skin is warm and dry.  Dry crusted abrasion right eyebrow.  Psychiatric: His affect is blunt. He is withdrawn. Cognition and memory are normal.  Patient has normal sensation proximal to C6 dermatome. Patient has reduced sensation to pinprick but is able to identify the area touched At bilateral C6 through sacral levels Patient has decreased tone in bilateral Lower extremities He has 3+ right triceps biceps brachial radialis reflex, absent left biceps triceps brachial radialis reflexes Motor strength is 3 minus right deltoid, bicep, tricep, finger extensor and flexor 2 minus right hand intrinsics 3 minus left deltoid, bicep, tricep 2 minus grip and absent hand intrinsics 0/5 bilateral hip flexors and knee extensors ankle dorsiflexors and plantar flexors   Lab Results Last 24 Hours    No results found for this or any previous visit (from the past 24 hour(s)).    Imaging Results (Last 48 hours)    Dg Cervical Spine 2-3 Views  11/14/2014 CLINICAL DATA: Cervical spine fracture. EXAM: CERVICAL SPINE - 2-3 VIEW COMPARISON: MRI and CT 8/4 and 11/10/2014. FINDINGS: Intraoperative lateral localization radiographs are submitted for interpretation. Initial localization shows probe at C4-C5. Subsequent images demonstrate C5-C6 ACDF. Endotracheal tube and orogastric tube present. Surgical packing is present on the last image  in the precervical soft tissues. IMPRESSION:  C5-C6 ACDF. Electronically Signed By: Andreas Newport M.D. 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 Does the need for close, 24 hr/day medical supervision in concert with the patient's rehab needs make it unreasonable for this patient to be served in a less intensive setting? Yes 2. Co-Morbidities requiring supervision/potential complications: Severe bradycardia, fever, Dysphagia 3. Due to bladder management, bowel management, safety, skin/wound care, disease management, medication administration, pain management and patient education, does the patient require 24 hr/day rehab nursing? Yes 4. Does the patient require coordinated care of a physician, rehab nurse, PT (11-2 hrs/day, 5 days/week) and OT (1-2 hrs/day, 5 days/week) to address physical and functional deficits in the context of the above medical diagnosis(es)? Yes Addressing deficits in the following areas: balance, endurance, locomotion, strength, transferring, bowel/bladder control, bathing, dressing, feeding, grooming and toileting 5. Can the patient actively participate in an intensive therapy program of at least 3 hrs of therapy per day at least 5 days per week? not currently but should be able to do so in 3-4 days 6. The potential for patient to make measurable gains while on inpatient rehab is currently poor but should be good potential once he is able to tolerate full program 7. Anticipated functional outcomes upon discharge from inpatient rehab are min assist, mod assist and wheelchair level with PT, min assist, mod assist and wheelchair level with OT, n/a with SLP. 8. Estimated rehab length of stay to reach the above functional goals is: 22-28 days 9. Does the patient have adequate social supports and living environment to accommodate these discharge functional goals?  Potentially 10. Anticipated D/C setting: Home 11. Anticipated post D/C treatments: HH therapy 12. Overall Rehab/Functional Prognosis: good  RECOMMENDATIONS: This patient's condition is appropriate for continued rehabilitative care in the following setting: CIR Patient has agreed to participate in recommended program. Potentially Note that insurance prior authorization may be required for reimbursement for recommended care.  Comment: Patient currently not able to tolerate or fully participate in inpatient regurgitation. Will monitor progress.    11/15/2014       Revision History     Date/Time User Provider Type Action   11/16/2014 4:29 PM Erick Colace, MD Physician Sign   11/15/2014 5:36 PM Jacquelynn Cree, PA-C Physician Assistant Pend   View Details Report       Routing History     Date/Time From To Method   11/16/2014 4:29 PM Erick Colace, MD Erick Colace, MD In [facility]

## 2014-11-20 NOTE — Progress Notes (Signed)
Retta Diones, RN Rehab Admission Coordinator Signed Physical Medicine and Rehabilitation PMR Pre-admission 11/20/2014 12:30 PM  Related encounter: ED to Hosp-Admission (Current) from 11/09/2014 in [facility] Collapse All   PMR Admission Coordinator Pre-Admission Assessment  Patient: Carl [name] is an 64 y.o., male MRN: [id number] DOB: 06-23-1950 Height: '5\' 8"'  (172.7 cm) Weight: 90.719 kg (200 lb)  Insurance Information HMO: PPO: Yes PCP: IPA: 80/20: OTHER:  PRIMARY: [employer] BCBS Policy#: [id number] Subscriber: Carl [name] CM Name: Carl [name] Phone#: [phone] Fax#: [phone] Pre-Cert#: [id number] X 7 days initially Employer: Retired Benefits: Phone #: [phone] Name: Carl [name]. 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 male who fell from the roof while painting his house with complaints of neck pain and inability to move BLE with weakness BUE. Patient hypotensive at admission requiring dopamine and GCS 15. Bedside ultrasound negative and  patient with poor air movement with poor respiratory effort. Work up revealed comminuted left C5 and C6 fractures with prevertebral and paraspinal edema, extensive cervical strain and cervical cord contusion with swelling extending C5-C6 with intramedullary hematoma, nondisplaced right 9th and 10th ribs and right eyelid laceration. Patient with numbness BLE as well as numbness/pain in chest and abdominal wall. Right brow lacerations repaired by Dr. Prudencio Burly and no signs of globe injury noted. He was placed on bedrest with Aspen collar in place and underwent ACDF with PEEK cage by Dr. Vertell Limber on 08/09. PT/OT evaluations completed and CIR consulted in anticipation of extensive rehab needs.   Past Medical History  History reviewed. No pertinent past medical history.  Family History  family history includes Alzheimer's disease in his mother; Aneurysm in his brother; Heart attack in his father.  Prior Rehab/Hospitalizations:  Has the patient had major surgery during 100 days prior to admission? No  Current Medications   Current facility-administered medications:  . 0.9 % sodium chloride infusion, 250 mL, Intravenous, Continuous, Erline Levine, MD . acetaminophen (TYLENOL) solution 650 mg, 650 mg, Oral, Q6H PRN, Stark Klein, MD, 650 mg at 11/16/14 0951 . acetaminophen (TYLENOL) tablet 650 mg, 650 mg, Oral, Q4H PRN, 650 mg at 11/19/14 1851 **OR** acetaminophen (TYLENOL) suppository 650 mg, 650 mg, Rectal, Q4H PRN, Erline Levine, MD . alum & mag hydroxide-simeth (MAALOX/MYLANTA) 200-200-20 MG/5ML suspension 30 mL, 30 mL, Oral, Q6H PRN, Erline Levine, MD . aspirin EC tablet 81 mg, 81 mg, Oral, Daily, Erline Levine, MD, 81 mg at 11/20/14 1004 . bacitracin-polymyxin b (POLYSPORIN) ophthalmic ointment, , Right Eye, BID, Katy Apo, MD . dextrose 5 % and 0.45 % NaCl with KCl 20 mEq/L infusion, , Intravenous, Continuous, Erline Levine, MD, Last Rate: 75 mL/hr at 11/19/14 0835 . docusate sodium  (COLACE) capsule 100 mg, 100 mg, Oral, BID, Erline Levine, MD, 100 mg at 11/20/14 1004 . enoxaparin (LOVENOX) injection 40 mg,  40 mg, Subcutaneous, Q24H, Georganna Skeans, MD, 40 mg at 11/19/14 1207 . HYDROmorphone (DILAUDID) injection 0.5 mg, 0.5 mg, Intravenous, Q2H PRN, Georganna Skeans, MD, 0.5 mg at 11/16/14 0124 . HYDROmorphone (DILAUDID) injection 0.5-1 mg, 0.5-1 mg, Intravenous, Q4H PRN, Judeth Horn, MD, 0.5 mg at 11/19/14 0009 . HYDROmorphone (DILAUDID) injection 1 mg, 1 mg, Intravenous, Q2H PRN, Georganna Skeans, MD, 1 mg at 11/19/14 1635 . menthol-cetylpyridinium (CEPACOL) lozenge 3 mg, 1 lozenge, Oral, PRN **OR** phenol (CHLORASEPTIC) mouth spray 1 spray, 1 spray, Mouth/Throat, PRN, Erline Levine, MD, 1 spray at 11/17/14 1204 . methocarbamol (ROBAXIN) tablet 500 mg, 500 mg, Oral, Q6H PRN, 500 mg at 11/14/14 1624 **OR** methocarbamol (ROBAXIN) 500 mg in dextrose 5 % 50 mL IVPB, 500 mg, Intravenous, Q6H PRN, Erline Levine, MD . ondansetron [facility]) tablet 4 mg, 4 mg, Oral, Q6H PRN **OR** ondansetron (ZOFRAN) injection 4 mg, 4 mg, Intravenous, Q6H PRN, Georganna Skeans, MD, 4 mg at 11/10/14 1824 . ondansetron (ZOFRAN) injection 4 mg, 4 mg, Intravenous, Q4H PRN, Erline Levine, MD . oxyCODONE-acetaminophen (PERCOCET/ROXICET) 5-325 MG per tablet 1-2 tablet, 1-2 tablet, Oral, Q4H PRN, Erline Levine, MD, 1 tablet at 11/15/14 1320 . oxyCODONE-acetaminophen (PERCOCET/ROXICET) 5-325 MG per tablet 1-2 tablet, 1-2 tablet, Oral, Q4H PRN, Judeth Horn, MD, 2 tablet at 11/17/14 [phone] . pantoprazole (PROTONIX) EC tablet 40 mg, 40 mg, Oral, Daily, 40 mg at 11/20/14 1004 **OR** [DISCONTINUED] pantoprazole (PROTONIX) injection 40 mg, 40 mg, Intravenous, Daily, Georganna Skeans, MD, 40 mg at 11/17/14 1053 . polyethylene glycol (MIRALAX / GLYCOLAX) packet 17 g, 17 g, Oral, Daily PRN, Erline Levine, MD . pregabalin (LYRICA) capsule 75 mg, 75 mg, Oral, BID, Georganna Skeans, MD, 75 mg at 11/20/14 1004 . sodium chloride  0.9 % injection 10-40 mL, 10-40 mL, Intracatheter, Q12H, Erline Levine, MD, 10 mL at 11/18/14 2200 . sodium chloride 0.9 % injection 10-40 mL, 10-40 mL, Intracatheter, PRN, Erline Levine, MD, 10 mL at 11/19/14 2226 . sodium chloride 0.9 % injection 3 mL, 3 mL, Intravenous, Q12H, Erline Levine, MD, 3 mL at 11/20/14 1006 . sodium chloride 0.9 % injection 3 mL, 3 mL, Intravenous, PRN, Erline Levine, MD . sodium phosphate (FLEET) 7-19 GM/118ML enema 1 enema, 1 enema, Rectal, Once PRN, Erline Levine, MD . traMADol Veatrice Bourbon) tablet 50 mg, 50 mg, Oral, 4 times per day, Judeth Horn, MD, 50 mg at 11/20/14 2197  Patients Current Diet: DIET DYS 3 Room service appropriate?: Yes; Fluid consistency:: Thin  Precautions / Restrictions Precautions Precautions: Cervical, Fall Precaution Comments: Monitor BP closely with BP control measures (wrapping, SCDs, Ted hose EOB) Cervical Brace: Hard collar, At all times Restrictions Weight Bearing Restrictions: No   Has the patient had 2 or more falls or a fall with injury in the past year?No  Prior Activity Level Community (5-7x/wk): Went out daily. Had hobbies, went fishing, worked on [employer], worked on [employer]. 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  Home Assistive Devices / Equipment Home Equipment: None  Prior Device Use: Indicate devices/aids used by the patient prior to current illness, exacerbation or injury? None of the above. Was independent and driving PTA, no devices used.  Prior Functional Level Prior Function Level of Independence: Independent Comments: pastors a church in [place]: Did the patient need help bathing, dressing, using the toilet or eating? Independent  Indoor Mobility: Did the patient need assistance with walking from room to room (with or without device)? Independent  Stairs: Did the patient need assistance  with internal or external stairs (with or without device)? Independent  Functional Cognition:  Did the patient need help planning regular tasks such as shopping or remembering to take medications? Independent  Current Functional Level Cognition  Overall Cognitive Status: Within Functional Limits for tasks assessed Orientation Level: Oriented X4   Extremity Assessment (includes Sensation/Coordination)  Upper Extremity Assessment: RUE deficits/detail RUE Deficits / Details: Rt UE: deltoid 3+/5 - 4-/5; bicep tricep 3+/5 - 4-/5; sup/pron 2+/5; wrist flex/ext 3/5. finger ext trace; finger flex trace RUE Sensation: decreased light touch RUE Coordination: decreased fine motor, decreased gross motor LUE Deficits / Details: Rt UE: deltoid 3/5 - bicep tricep 3/5 ; sup/pron 2+/5; wrist flex/ext 3/5. finger ext 2/5; finger flex 1/5  LUE Sensation: decreased light touch  Lower Extremity Assessment: Defer to PT evaluation RLE Deficits / Details: PROM WFL no active movement noted throughout RLE Sensation: decreased light touch (intact proprioception at ankle) LLE Deficits / Details: PROM WFL no active movement noted throughout LLE Sensation: decreased light touch (intact proprioception at ankle)    ADLs  Overall ADL's : Needs assistance/impaired Eating/Feeding: Minimal assistance Eating/Feeding Details (indicate cue type and reason): Using lidded mug and universal cuff, pt was able to feed self with minA to guide Rt UE initially, and to prevent dropping of cup while drinking from cup.  Grooming: Wash/dry face, Minimal assistance, Bed level Grooming Details (indicate cue type and reason): using Rt UE  Upper Body Bathing: Total assistance, Bed level Lower Body Bathing: Total assistance, Bed level Upper Body Dressing : Total assistance, Bed level Lower Body Dressing: Total assistance, Bed level Toilet Transfer: Total assistance Toileting- Clothing Manipulation and Hygiene: Total assistance, Bed level General ADL Comments: Session focused on EOB sitting balance for trunk  strengthening and use of BIL UE for support. Pt tolerated EOB with SCD for BP support. Pt with decr SBP > 20 points with EOB. Pt    Mobility  Overal bed mobility: Needs Assistance Bed Mobility: Rolling, Sidelying to Sit Rolling: Max assist, +2 for physical assistance Sidelying to sit: Max assist, +2 for physical assistance Supine to sit: +2 for physical assistance, Total assist, HOB elevated Sit to sidelying: Total assist, +2 for physical assistance General bed mobility comments: Pt able to hook Rt arm with PT and assist with roll and pull to seated position. Required max assist to facilitate roll and for truncal control while rising to edge of bed.    Transfers  Overall transfer level: Needs assistance Equipment used: Ambulation equipment used General transfer comment: Maxi-Lift utilized    Ambulation / Gait / Stairs / Office manager / Balance Dynamic Sitting Balance Sitting balance - Comments: Sat EOB with 4/10 dizziness, BP 118/70 HR 57. Tolerated x 5 minutes. Balance Overall balance assessment: History of Falls, Needs assistance Sitting balance-Leahy Scale: Zero Sitting balance - Comments: Sat EOB with 4/10 dizziness, BP 118/70 HR 57. Tolerated x 5 minutes.    Special needs/care consideration BiPAP/CPAP No CPM No Continuous Drip IV No Dialysis No  Life Vest No Oxygen On 02 2L [place] in hospital, but no 02 at home Special Bed No Trach Size No Wound Vac (area) No  Skin Lacerations right eye, right elbow and a neck incision. Cervical collar in place.  Bowel mgmt: Last BM 11/18/14, incontinent, loose  Bladder mgmt: Urinary catheter Diabetic mgmt No    Previous Home Environment Living Arrangements: Spouse/significant other Available Help at Discharge: Family Type of Home: House Home Layout:  Multi-level, Full bath on main level, Able to live on main level with bedroom/bathroom Alternate Level Stairs-Number of Steps:  don't go up to second level, has a basement Home Access: Stairs to enter Entrance Stairs-Rails: Left, Right Entrance Stairs-Number of Steps: 6 Bathroom Shower/Tub: Tub only Biochemist, clinical: [place]: No  Discharge Living Setting Plans for Discharge Living Setting: Patient's home, House, Lives with (comment) (Lives with wife. Daughter across the street.) Type of Home at Discharge: House Discharge Home Layout: Multi-level, Laundry or work area in basement, Full bath on main level, Able to live on main level with bedroom/bathroom (Two levels with a basement as well.) Alternate Level Stairs-Number of Steps: Flight Discharge Home Access: Stairs to enter [employer] of Steps: 5-6 steps Does the patient have any problems obtaining your medications?: No  Social/Family/Support Systems Patient Roles: Spouse, Parent (Has a wife and 2 daughters.) Contact Information: Pat Boldin - wife Anticipated Caregiver: wife Anticipated Caregiver's Contact Information: Fraser Din - wife (c) [phone] Ability/Limitations of Caregiver: Wife is retired and can assist. Careers adviser: 24/7 Discharge Plan Discussed with Primary Caregiver: Yes Is Caregiver In Agreement with Plan?: Yes Does Caregiver/Family have Issues with Lodging/Transportation while Pt is in Rehab?: No  Goals/Additional Needs Patient/Family Goal for Rehab: PT/OT min/mod assist wheel chair level goals Expected length of stay: 22-28 days Cultural Considerations: Doristine Bosworth of [place], a [employer]. Dietary Needs: Dys 3, thin liquids Equipment Needs: TBD Pt/Family Agrees to Admission and willing to participate: Yes Program Orientation Provided & Reviewed with Pt/Caregiver Including Roles & Responsibilities: Yes  Decrease burden of Care through IP rehab admission: N/A  Possible need for SNF placement upon discharge: Not planned  Patient Condition: This patient's medical and functional  status has changed since the consult dated: 11/16/14 in which the Rehabilitation Physician determined and documented that the patient's condition is appropriate for intensive rehabilitative care in an inpatient rehabilitation facility. See "History of Present Illness" (above) for medical update. Functional changes are: Currently requiring max/ total assist +2 for mobility. Patient's medical and functional status update has been discussed with the Rehabilitation physician and patient remains appropriate for inpatient rehabilitation. Will admit to inpatient rehab today.  Preadmission Screen Completed By: Retta Diones, 11/20/2014 1:00 PM ______________________________________________________________________  Discussed status with Dr. Naaman Plummer on 11/20/14 at 1259 and received telephone approval for admission today.  Admission Coordinator: Retta Diones, time1300/Date08/15/16          Cosigned by: Meredith Staggers, MD at 11/20/2014 1:54 PM  Revision History     Date/Time User Provider Type Action   11/20/2014 1:54 PM Meredith Staggers, MD Physician Cosign   11/20/2014 1:00 PM Retta Diones, RN Rehab Admission Coordinator Sign

## 2014-11-20 NOTE — Discharge Summary (Signed)
Physician Discharge Summary  Carl [name] ZOX:[id number] DOB: 1950-05-22 DOA: 11/09/2014  PCP: No primary care provider on file.  Consultation: NSU---Dr. Venetia Maxon   Ophthalmology---Dr. Cheree Ditto  Admit date: 11/09/2014 Discharge date: 11/20/2014  Recommendations for Outpatient Follow-up:   Follow-up Information    Call Dorian Heckle, MD.   Specialty:  Neurosurgery   Contact information:   [address] [place] [address] [phone]       Follow up with [facility].   Why:  As needed   Contact information:   [address] [id number] mc [place] [address] [phone]     Discharge Diagnoses:  1. Fall 2. C5-C6 fractures and cord injury 3. Right facial laceration 4. Chronic bradycardia 5. Acute neurogenic shock 6. FUO   Surgical Procedure: ACDF by Dr. Venetia Maxon  Discharge Condition: stable Disposition: CIR  Diet recommendation: regular  Filed Weights   11/09/14 1550 11/16/14 0130  Weight: 88.451 kg (195 lb) 90.719 kg (200 lb)     Filed Vitals:   11/20/14 0951  BP: 107/49  Pulse: 64  Temp: 99.3 F (37.4 C)  Resp: 18     Hospital Course:  Carl [name] came to [facility] as a level II trauma followed a fall.  He was upgraded to a level 1 trauma due to hypotension.  On arrival, he was unable to move his legs.  He was found to have multiple injuries listed above and admitted to the ICU.  He required dopamine for neurogenic shock which was weaned off.  Dr. Venetia Maxon was consulted for the c spine fractures and the patient had an ACDF.  He was mobilized with therapies who recommended CIR.  He had fevers with a negative work up.  On HD#11 he was felt stable for discharge to rehab.   Ophthalmology closed right brow laceration. Found to have a right elbow laceration to the right arm which was stapled.     Discharge Instructions     Medication List    STOP taking these medications        ibuprofen 200  MG tablet  Commonly known as:  ADVIL,MOTRIN     SAW PALMETTO PO      TAKE these medications        aspirin EC 81 MG tablet  Take 81 mg by mouth daily.           Follow-up Information    Call Dorian Heckle, MD.   Specialty:  Neurosurgery   Contact information:   [address] [place] [address] [phone]       Follow up with [facility].   Why:  As needed   Contact information:   [address] [id number] mc [place] [address] [phone]       The results of significant diagnostics from this hospitalization (including imaging, microbiology, ancillary and laboratory) are listed below for reference.    Significant Diagnostic Studies: Dg Cervical Spine 2-3 Views  11/14/2014   CLINICAL DATA:  Cervical spine fracture.  EXAM: CERVICAL SPINE - 2-3 VIEW  COMPARISON:  MRI and CT 8/4 and 11/10/2014.  FINDINGS: Intraoperative lateral localization radiographs are submitted for interpretation. Initial localization shows probe at C4-C5. Subsequent images demonstrate C5-C6 ACDF. Endotracheal tube and orogastric tube present. Surgical packing is present on the last image in the precervical soft tissues.  IMPRESSION: C5-C6 ACDF.   Electronically Signed   By: Andreas Newport M.D.   On:  11/14/2014 14:22   Dg Elbow Complete Right  11/09/2014   CLINICAL DATA:  Fall with posterior elbow laceration and pain.  EXAM: RIGHT ELBOW - COMPLETE 3+ VIEW  COMPARISON:  None.  FINDINGS: Soft tissue swelling around the olecranon with faint foreign body measuring 2 mm. There is also a metallic appearing foreign body at the olecranon medially measuring 3 mm. No evidence of fracture, dislocation, or joint effusion.  Note that positioning is nonstandard due to patient condition, and this best obtainable exam is decreasing sensitivity.  IMPRESSION: 1. Two foreign bodies about the olecranon, up to 3 mm. 2. No evidence of fracture.    Electronically Signed   By: Marnee Spring M.D.   On: 11/09/2014 18:26   Ct Head Wo Contrast  11/09/2014   CLINICAL DATA:  Patient is status post fall with neck pain and bilateral shoulder pain. Patient not able to move lower extremities, with tingling in hands and arms.  EXAM: CT HEAD WITHOUT CONTRAST  TECHNIQUE: Contiguous axial images were obtained from the vertex to the thoracic inlet without intravenous contrast.  COMPARISON:  None.  FINDINGS: CT of the head: No mass effect or midline shift. No evidence of acute intracranial hemorrhage, or infarction. No abnormal extra-axial fluid collections. Gray-white matter differentiation is normal. Basal cisterns are preserved.  No depressed skull fractures. Visualized paranasal sinuses and mastoid air cells are not opacified. There is a soft tissue injury of the right forehead superior to the right orbit containing punctate hyperdense foreign body in a 2 mm gas bubble. No underlying osseous injury seen.  CT of the cervical spine: There is a comminuted minimally displaced fracture of the left inferior articular facet of C5 and minimally displaced fracture of the left superior articular facet of C6. A linear calcific fragment is seen within the L5 left neural foramen, which likely represents a fracture fragment. Additionally, there is a fracture of the posterior process of C4, with left lateral displacement of the distal fracture fragment. There is also a nondisplaced fracture of the posterior process of C5. The may be a tiny avulsion fracture off of the posterior facet of C6. There is no evidence of alignment abnormality. No evidence of epidural hematoma or spinal canal narrowing.  There are multilevel degenerative changes of the cervical spine with disc height loss, endplate sclerosis and posterior and anterior osteophytes. Ligamentous calcifications are also seen.  CT of the face: The globes and extraocular muscles appear symmetrical. No air fluid levels in the  paranasal sinuses.  The frontal bones, orbital rims, maxillary antral walls, nasal bones, nasal septum, nasal spine, maxilla, pterygoid plates, zygomatic arches, temporomandibular joints, and mandibles appear intact.  No displaced fractures are identified. Visualized thyroid cartilage and hyoid bone appear intact.  IMPRESSION: No CT evidence of intracranial traumatic injury.  Comminuted fracture of left C5 articular facet, and minimally displaced fracture of the left C6 articular facet, with possible minute fracture fragment within C5 left neural foramen.  Fractures of C4, C5 and likely C6 posterior processes.  No evidence of epidural hematoma, or spinal canal narrowing.  No evidence of facial fractures.  Given the severity of patient's symptoms, MRI of the cervical spine may be considered, when clinically feasible.  Key findings were discussed with Violeta Gelinas at the completion of the exam.   Electronically Signed   By: Ted Mcalpine M.D.   On: 11/09/2014 17:17   Ct Chest W Contrast  11/09/2014   CLINICAL DATA:  Fall.  Cannot move lower extremities.  EXAM: CT CHEST, ABDOMEN, AND PELVIS WITH CONTRAST  TECHNIQUE: Multidetector CT imaging of the chest, abdomen and pelvis was performed following the standard protocol during bolus administration of intravenous contrast.  CONTRAST:  OMNIPAQUE IOHEXOL 300 MG/ML  SOLN  COMPARISON:  None.  FINDINGS: CT CHEST FINDINGS  THORACIC INLET/BODY WALL:  No acute abnormality.  MEDIASTINUM:  Normal heart size. No pericardial effusion. No acute vascular abnormality. No adenopathy.  LUNG WINDOWS:  No contusion, hemothorax, or pneumothorax.  4 mm pulmonary nodule in the upper lingula on image 28. 2 mm subpleural nodule on the right on image 34.  OSSEOUS:  See below  CT ABDOMEN AND PELVIS FINDINGS  BODY WALL: Unremarkable.  Liver: No focal abnormality.  Biliary: No evidence of biliary obstruction or stone.  Pancreas: Unremarkable.  Spleen: Unremarkable.  Adrenals:  Unremarkable.  Kidneys and ureters: No traumatic findings.  Bladder: Unremarkable.  Reproductive: Enlarged prostate, projecting into the bladder base.  Bowel: No evidence of injury  Retroperitoneum: No mass or adenopathy.  Peritoneum: No free fluid or gas.  Vascular: No acute findings.  OSSEOUS: Nondisplaced posterior right ninth and tenth rib fractures.  Left C5 articular process fracture with bony fragment in the C5-6 foramen, as described on dedicated CT of the cervical spine. No cervical or thoracic spine fractures identified.  IMPRESSION: 1. Nondisplaced posterior right ninth and tenth rib fractures. 2. No evidence of intrathoracic or intra-abdominal injury. 3. 4 mm pulmonary nodule in the lingula. If the patient is at high risk for bronchogenic carcinoma, follow-up chest CT at 1 year is recommended. If the patient is at low risk, no follow-up is needed. This recommendation follows the consensus statement: Guidelines for Management of Small Pulmonary Nodules Detected on CT Scans: A Statement from the Fleischner Society as published in Radiology 2005; 237:395-400. 4. Enlarged prostate.   Electronically Signed   By: Marnee Spring M.D.   On: 11/09/2014 16:49   Ct Cervical Spine Wo Contrast  11/09/2014   CLINICAL DATA:  Patient is status post fall with neck pain and bilateral shoulder pain. Patient not able to move lower extremities, with tingling in hands and arms.  EXAM: CT HEAD WITHOUT CONTRAST  TECHNIQUE: Contiguous axial images were obtained from the vertex to the thoracic inlet without intravenous contrast.  COMPARISON:  None.  FINDINGS: CT of the head: No mass effect or midline shift. No evidence of acute intracranial hemorrhage, or infarction. No abnormal extra-axial fluid collections. Gray-white matter differentiation is normal. Basal cisterns are preserved.  No depressed skull fractures. Visualized paranasal sinuses and mastoid air cells are not opacified. There is a soft tissue injury of the right  forehead superior to the right orbit containing punctate hyperdense foreign body in a 2 mm gas bubble. No underlying osseous injury seen.  CT of the cervical spine: There is a comminuted minimally displaced fracture of the left inferior articular facet of C5 and minimally displaced fracture of the left superior articular facet of C6. A linear calcific fragment is seen within the L5 left neural foramen, which likely represents a fracture fragment. Additionally, there is a fracture of the posterior process of C4, with left lateral displacement of the distal fracture fragment. There is also a nondisplaced fracture of the posterior process of C5. The may be a tiny avulsion fracture off of the posterior facet of C6. There is no evidence of alignment abnormality. No evidence of epidural hematoma or spinal canal narrowing.  There are multilevel degenerative changes of the cervical spine with  disc height loss, endplate sclerosis and posterior and anterior osteophytes. Ligamentous calcifications are also seen.  CT of the face: The globes and extraocular muscles appear symmetrical. No air fluid levels in the paranasal sinuses.  The frontal bones, orbital rims, maxillary antral walls, nasal bones, nasal septum, nasal spine, maxilla, pterygoid plates, zygomatic arches, temporomandibular joints, and mandibles appear intact.  No displaced fractures are identified. Visualized thyroid cartilage and hyoid bone appear intact.  IMPRESSION: No CT evidence of intracranial traumatic injury.  Comminuted fracture of left C5 articular facet, and minimally displaced fracture of the left C6 articular facet, with possible minute fracture fragment within C5 left neural foramen.  Fractures of C4, C5 and likely C6 posterior processes.  No evidence of epidural hematoma, or spinal canal narrowing.  No evidence of facial fractures.  Given the severity of patient's symptoms, MRI of the cervical spine may be considered, when clinically feasible.  Key  findings were discussed with Violeta Gelinas at the completion of the exam.   Electronically Signed   By: Ted Mcalpine M.D.   On: 11/09/2014 17:17   Mr Cervical Spine Wo Contrast  11/10/2014   CLINICAL DATA:  Fall from roof. Tingling in the arms and fingers. Patient unable to move legs. Follow-up abnormal CT.  EXAM: MRI CERVICAL SPINE WITHOUT CONTRAST  TECHNIQUE: Multiplanar, multisequence MR imaging of the cervical spine was performed. No intravenous contrast was administered.  COMPARISON:  Cervical spine CT 11/09/2014.  FINDINGS: Alignment: Mildly exaggerated cervical lordosis. No spondylolisthesis.  Vertebrae: Bone marrow edema around the fractures at C5 and C6 is poorly visible on the inversion recovery images, partially due to technical factors. No aggressive osseous lesions.  Cord: The upper cervical cord appears normal. Abnormal cord signal is present from C5 through C6, best seen on inversion recovery images. On gradient echo images, there is an intramedullary hematoma dorsal the C6 measuring about 8 mm. This is more difficult to appreciate on the axial image sets. Cervical cord is colon compatible with cord contusion.  Posterior Fossa: Normal.  Vertebral Arteries: Flow voids present in both vertebral arteries.  Paraspinal tissues: There is both prevertebral edema and posterior paraspinal edema associated with cervical spine fractures. Extensive cervical strain is present with posterior paraspinal muscular edema. No large paraspinal muscular hematoma is identified. Although these images are mildly technically degraded, on the inversion recovery images, there appears to be discontinuity of the anterior longitudinal ligament at C5-C6 consistent with a tear. There is also probably posterior longitudinal ligament disruption given the cord injury.  Disc Signal: Diffuse disc desiccation.  Disc levels:  C2-C3:  Negative.  C3-C4: Bilateral foraminal stenosis associated with uncovertebral spurring. Central  canal patent.  C4-C5: Broad-based disc osteophyte complex and posterior ligamentum flavum redundancy produce mild central stenosis. There is bilateral uncovertebral spurring producing bilateral foraminal stenosis.  C5-C6: Cord swelling is present at this level. Superimposed degenerative disease is present with broad-based disc osteophyte complex. In conjunction with the cord swelling, there is almost complete effacement of the subarachnoid space at C5-C6 disc level. There is LEFT-greater-than-RIGHT bilateral foraminal stenosis secondary to uncovertebral spurring and the LEFT-sided C6 superior articular process fracture probably contributes to RIGHT foraminal stenosis.  C6-C7: Severe disc degeneration with shallow disc osteophyte complex producing mild central stenosis. Mild bilateral foraminal stenosis secondary to uncovertebral spurring.  C7-T1:  Bilateral facet arthrosis without stenosis.  IMPRESSION: 1. Cervical cord contusion extending dorsal to C5 and C6 vertebrae. intramedullary hematoma measuring 8 mm dorsal to the C6 vertebra. Combination of  cord swelling and degenerative disease effaces the subarachnoid space at the level of the cord contusion/hematoma. 2. Moderate multilevel cervical spondylosis detailed above. 3. LEFT eccentric cervical strain associated with previously demonstrated cervical spine fracture. 4. Critical Value/emergent results were called by telephone at the time of interpretation on 11/10/2014 at 1:56 pm to Dr. Jimmye Norman , who verbally acknowledged these results.   Electronically Signed   By: Andreas Newport M.D.   On: 11/10/2014 13:56   Ct Abdomen Pelvis W Contrast  11/09/2014   CLINICAL DATA:  Fall.  Cannot move lower extremities.  EXAM: CT CHEST, ABDOMEN, AND PELVIS WITH CONTRAST  TECHNIQUE: Multidetector CT imaging of the chest, abdomen and pelvis was performed following the standard protocol during bolus administration of intravenous contrast.  CONTRAST:  OMNIPAQUE IOHEXOL 300  MG/ML  SOLN  COMPARISON:  None.  FINDINGS: CT CHEST FINDINGS  THORACIC INLET/BODY WALL:  No acute abnormality.  MEDIASTINUM:  Normal heart size. No pericardial effusion. No acute vascular abnormality. No adenopathy.  LUNG WINDOWS:  No contusion, hemothorax, or pneumothorax.  4 mm pulmonary nodule in the upper lingula on image 28. 2 mm subpleural nodule on the right on image 34.  OSSEOUS:  See below  CT ABDOMEN AND PELVIS FINDINGS  BODY WALL: Unremarkable.  Liver: No focal abnormality.  Biliary: No evidence of biliary obstruction or stone.  Pancreas: Unremarkable.  Spleen: Unremarkable.  Adrenals: Unremarkable.  Kidneys and ureters: No traumatic findings.  Bladder: Unremarkable.  Reproductive: Enlarged prostate, projecting into the bladder base.  Bowel: No evidence of injury  Retroperitoneum: No mass or adenopathy.  Peritoneum: No free fluid or gas.  Vascular: No acute findings.  OSSEOUS: Nondisplaced posterior right ninth and tenth rib fractures.  Left C5 articular process fracture with bony fragment in the C5-6 foramen, as described on dedicated CT of the cervical spine. No cervical or thoracic spine fractures identified.  IMPRESSION: 1. Nondisplaced posterior right ninth and tenth rib fractures. 2. No evidence of intrathoracic or intra-abdominal injury. 3. 4 mm pulmonary nodule in the lingula. If the patient is at high risk for bronchogenic carcinoma, follow-up chest CT at 1 year is recommended. If the patient is at low risk, no follow-up is needed. This recommendation follows the consensus statement: Guidelines for Management of Small Pulmonary Nodules Detected on CT Scans: A Statement from the Fleischner Society as published in Radiology 2005; 237:395-400. 4. Enlarged prostate.   Electronically Signed   By: Marnee Spring M.D.   On: 11/09/2014 16:49   Dg Pelvis Portable  11/09/2014   CLINICAL DATA:  Patient fell from ladder  EXAM: PORTABLE PELVIS 1-2 VIEWS  COMPARISON:  None.  FINDINGS: There is no evidence  of pelvic fracture or dislocation. Joint spaces appear intact. No erosive change.  IMPRESSION: No demonstrable fracture or dislocation. No appreciable arthropathy.   Electronically Signed   By: Bretta Bang III M.D.   On: 11/09/2014 15:55   Dg Chest Port 1 View  11/19/2014   CLINICAL DATA:  Fever, fall, spinal cord injury, quadriparesis  EXAM: PORTABLE CHEST - 1 VIEW  COMPARISON:  11/16/2014  FINDINGS: Lungs are essentially clear. No focal consolidation. No pleural effusion or pneumothorax.  The heart is top-normal in size.  Left arm PICC terminates in the mid SVC.  Cervical spine fixation hardware.  IMPRESSION: No evidence of acute cardiopulmonary disease.  Left arm PICC terminates in the mid SVC.   Electronically Signed   By: Charline Bills M.D.   On: 11/19/2014 18:33  Dg Chest Port 1 View  11/16/2014   CLINICAL DATA:  Postop fever  EXAM: PORTABLE CHEST - 1 VIEW  COMPARISON:  11/10/2014  FINDINGS: A left-sided PICC line and is now seen with the catheter tip in the mid superior vena cava in satisfactory position. Small bilateral pleural effusions with associated atelectatic changes are seen. Cardiac shadow is stable. Previously noted rib fractures are not well appreciated. No pneumothorax is seen.  IMPRESSION: Small bilateral pleural effusions with small basilar atelectasis. Status post PICC line placement in the mid superior vena cava.   Electronically Signed   By: Alcide Clever M.D.   On: 11/16/2014 11:25   Dg Chest Port 1 View  11/10/2014   CLINICAL DATA:  Subsequent encounter for multiple rib fractures  EXAM: PORTABLE CHEST - 1 VIEW  COMPARISON:  11/09/2014.  FINDINGS: 0615 hours. Lung volumes are low without edema, focal airspace consolidation, or pleural effusion. Cardiopericardial silhouette is at upper limits of normal for size. Telemetry leads overlie the chest. Prominent gastric bubble noted over the left upper quadrant of the abdomen. Posterior right-sided rib fracture seen on recent CT  not evident on x-ray.  IMPRESSION: Low volume film without acute cardiopulmonary findings.   Electronically Signed   By: Kennith Center M.D.   On: 11/10/2014 08:00   Dg Chest Portable 1 View  11/09/2014   CLINICAL DATA:  Fall  EXAM: PORTABLE CHEST - 1 VIEW  COMPARISON:  None.  FINDINGS: Heart size at upper limits of normal. Lungs are grossly clear. Support apparatus artifact is present over the mid chest. No gross evidence for pneumothorax. No displaced fracture identified.  IMPRESSION: No gross evidence for acute cardiopulmonary process. Borderline cardiomegaly may in part be related to portable supine AP technique. Chest CT will be subsequently performed and dictated separately.   Electronically Signed   By: Christiana Pellant M.D.   On: 11/09/2014 15:56   Ct Maxillofacial Wo Cm  11/09/2014   CLINICAL DATA:  Patient is status post fall with neck pain and bilateral shoulder pain. Patient not able to move lower extremities, with tingling in hands and arms.  EXAM: CT HEAD WITHOUT CONTRAST  TECHNIQUE: Contiguous axial images were obtained from the vertex to the thoracic inlet without intravenous contrast.  COMPARISON:  None.  FINDINGS: CT of the head: No mass effect or midline shift. No evidence of acute intracranial hemorrhage, or infarction. No abnormal extra-axial fluid collections. Gray-white matter differentiation is normal. Basal cisterns are preserved.  No depressed skull fractures. Visualized paranasal sinuses and mastoid air cells are not opacified. There is a soft tissue injury of the right forehead superior to the right orbit containing punctate hyperdense foreign body in a 2 mm gas bubble. No underlying osseous injury seen.  CT of the cervical spine: There is a comminuted minimally displaced fracture of the left inferior articular facet of C5 and minimally displaced fracture of the left superior articular facet of C6. A linear calcific fragment is seen within the L5 left neural foramen, which likely  represents a fracture fragment. Additionally, there is a fracture of the posterior process of C4, with left lateral displacement of the distal fracture fragment. There is also a nondisplaced fracture of the posterior process of C5. The may be a tiny avulsion fracture off of the posterior facet of C6. There is no evidence of alignment abnormality. No evidence of epidural hematoma or spinal canal narrowing.  There are multilevel degenerative changes of the cervical spine with disc height loss, endplate  sclerosis and posterior and anterior osteophytes. Ligamentous calcifications are also seen.  CT of the face: The globes and extraocular muscles appear symmetrical. No air fluid levels in the paranasal sinuses.  The frontal bones, orbital rims, maxillary antral walls, nasal bones, nasal septum, nasal spine, maxilla, pterygoid plates, zygomatic arches, temporomandibular joints, and mandibles appear intact.  No displaced fractures are identified. Visualized thyroid cartilage and hyoid bone appear intact.  IMPRESSION: No CT evidence of intracranial traumatic injury.  Comminuted fracture of left C5 articular facet, and minimally displaced fracture of the left C6 articular facet, with possible minute fracture fragment within C5 left neural foramen.  Fractures of C4, C5 and likely C6 posterior processes.  No evidence of epidural hematoma, or spinal canal narrowing.  No evidence of facial fractures.  Given the severity of patient's symptoms, MRI of the cervical spine may be considered, when clinically feasible.  Key findings were discussed with Violeta Gelinas at the completion of the exam.   Electronically Signed   By: Ted Mcalpine M.D.   On: 11/09/2014 17:17    Microbiology: No results found for this or any previous visit (from the past 240 hour(s)).   Labs: Basic Metabolic Panel:  Recent Labs Lab 11/14/14 0615 11/16/14 1048  NA 136 134*  K 4.1 4.3  CL 100* 99*  CO2 31 30  GLUCOSE 126* 132*  BUN 16 20   CREATININE 1.05 1.13  CALCIUM 8.3* 8.2*   Liver Function Tests: No results for input(s): AST, ALT, ALKPHOS, BILITOT, PROT, ALBUMIN in the last 168 hours. No results for input(s): LIPASE, AMYLASE in the last 168 hours. No results for input(s): AMMONIA in the last 168 hours. CBC:  Recent Labs Lab 11/14/14 0615 11/16/14 1048  WBC 7.8 10.9*  HGB 10.8* 10.7*  HCT 31.2* 31.3*  MCV 87.9 89.4  PLT 147* 177   Cardiac Enzymes: No results for input(s): CKTOTAL, CKMB, CKMBINDEX, TROPONINI in the last 168 hours. BNP: BNP (last 3 results) No results for input(s): BNP in the last 8760 hours.  ProBNP (last 3 results) No results for input(s): PROBNP in the last 8760 hours.  CBG: No results for input(s): GLUCAP in the last 168 hours.  Active Problems:   C5 vertebral fracture   Quadriplegia   Time coordinating discharge: <30 mins  Signed:  Aroura Vasudevan, ANP-BC

## 2014-11-20 NOTE — PMR Pre-admission (Signed)
PMR Admission Coordinator Pre-Admission Assessment  Patient: Carl [name] is an 64 y.o., male MRN: [id number] DOB: 1951/03/10 Height: _0  (172.7 cm) Weight: 90.719 kg (200 lb)              Insurance Information HMO:    PPO: Yes     PCP:       IPA:       80/20:       OTHER:   PRIMARY: [employer] BCBS      Policy#: [id number]      Subscriber: Sheridan Suares CM Name:  Enid Derry      Phone#: [phone]     Fax#: [phone] Pre-Cert#: [id number] X 7 days initially     Employer: Retired Benefits:  Phone #: [phone]     Name: Emmit Pomfret. 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 male who fell from the roof while painting his house with complaints of neck pain and inability to move BLE with weakness BUE. Patient hypotensive at admission requiring dopamine and GCS 15. Bedside ultrasound negative and patient with poor air movement with poor respiratory effort. Work up revealed comminuted left C5 and C6 fractures with prevertebral and paraspinal edema, extensive cervical strain and cervical cord contusion with swelling extending C5-C6 with intramedullary hematoma, nondisplaced right 9th and 10th ribs and right eyelid laceration. Patient with numbness BLE as well as numbness/pain in chest and abdominal wall. Right brow lacerations  repaired by Dr. Prudencio Burly and no signs of globe injury noted. He was placed on bedrest with Aspen collar in place and underwent ACDF with PEEK cage by Dr. Vertell Limber on 08/09. PT/OT evaluations completed and CIR consulted in anticipation of extensive rehab needs.    Past Medical History  History reviewed. No pertinent past medical history.  Family History  family history includes Alzheimer's disease in his mother; Aneurysm in his brother; Heart attack in his father.  Prior Rehab/Hospitalizations:  Has the patient had major surgery during 100 days prior to admission? No  Current Medications   Current facility-administered medications:  .  0.9 %  sodium chloride infusion, 250 mL, Intravenous, Continuous, Erline Levine, MD .  acetaminophen (TYLENOL) solution 650 mg, 650 mg, Oral, Q6H PRN, Stark Klein, MD, 650 mg at 11/16/14 0951 .  acetaminophen (TYLENOL) tablet 650 mg, 650 mg, Oral, Q4H PRN, 650 mg at 11/19/14 1851 **OR** acetaminophen (TYLENOL) suppository 650 mg, 650 mg, Rectal, Q4H PRN, Erline Levine, MD .  alum & mag hydroxide-simeth (MAALOX/MYLANTA) 200-200-20 MG/5ML suspension 30 mL, 30 mL, Oral, Q6H PRN, Erline Levine, MD .  aspirin EC tablet 81 mg, 81 mg, Oral, Daily, Erline Levine, MD, 81 mg at 11/20/14 1004 .  bacitracin-polymyxin b (POLYSPORIN) ophthalmic ointment, , Right Eye, BID, Katy Apo, MD .  dextrose 5 % and 0.45 % NaCl with KCl 20 mEq/L infusion, , Intravenous, Continuous, Erline Levine, MD, Last Rate: 75 mL/hr at 11/19/14 0835 .  docusate sodium (COLACE) capsule 100 mg, 100 mg, Oral, BID, Erline Levine, MD, 100 mg at 11/20/14 1004 .  enoxaparin (LOVENOX) injection 40 mg, 40 mg, Subcutaneous, Q24H, Georganna Skeans, MD, 40 mg at 11/19/14 1207 .  HYDROmorphone (DILAUDID) injection 0.5 mg, 0.5 mg, Intravenous, Q2H PRN, Georganna Skeans, MD, 0.5 mg at 11/16/14 0124 .  HYDROmorphone (DILAUDID) injection 0.5-1 mg, 0.5-1 mg, Intravenous, Q4H PRN, Judeth Horn, MD, 0.5 mg at 11/19/14 0009 .   HYDROmorphone (DILAUDID) injection 1 mg, 1 mg, Intravenous, Q2H PRN, Georganna Skeans, MD, 1 mg at 11/19/14 1635 .  menthol-cetylpyridinium (CEPACOL) lozenge 3 mg, 1 lozenge, Oral, PRN **OR** phenol (CHLORASEPTIC) mouth spray 1 spray, 1 spray, Mouth/Throat, PRN, Erline Levine, MD, 1 spray at 11/17/14 1204 .  methocarbamol (ROBAXIN) tablet 500 mg, 500 mg, Oral, Q6H PRN, 500 mg at 11/14/14 1624 **OR** methocarbamol (ROBAXIN) 500 mg in dextrose 5 % 50 mL IVPB, 500 mg, Intravenous, Q6H PRN, Erline Levine, MD .  ondansetron [facility]) tablet 4 mg, 4 mg, Oral, Q6H PRN **OR** ondansetron (ZOFRAN) injection 4 mg, 4 mg, Intravenous, Q6H PRN, Georganna Skeans, MD, 4 mg at 11/10/14 1824 .  ondansetron (ZOFRAN) injection 4 mg, 4 mg, Intravenous, Q4H PRN, Erline Levine, MD .  oxyCODONE-acetaminophen (PERCOCET/ROXICET) 5-325 MG per tablet 1-2 tablet, 1-2 tablet, Oral, Q4H PRN, Erline Levine, MD, 1 tablet at 11/15/14 1320 .  oxyCODONE-acetaminophen (PERCOCET/ROXICET) 5-325 MG per tablet 1-2 tablet, 1-2 tablet, Oral, Q4H PRN, Judeth Horn, MD, 2 tablet at 11/17/14 [phone] .  pantoprazole (PROTONIX) EC tablet 40 mg, 40 mg, Oral, Daily, 40 mg at 11/20/14 1004 **OR** [DISCONTINUED] pantoprazole (PROTONIX) injection 40 mg, 40 mg, Intravenous, Daily, Georganna Skeans, MD, 40 mg at 11/17/14 1053 .  polyethylene glycol (MIRALAX / GLYCOLAX) packet 17 g, 17 g, Oral, Daily PRN, Erline Levine, MD .  pregabalin (LYRICA) capsule 75 mg, 75 mg, Oral, BID, Georganna Skeans, MD, 75 mg at 11/20/14 1004 .  sodium chloride 0.9 % injection 10-40 mL, 10-40 mL, Intracatheter, Q12H, Erline Levine, MD, 10 mL at 11/18/14 2200 .  sodium chloride 0.9 % injection 10-40 mL, 10-40 mL, Intracatheter, PRN, Erline Levine, MD, 10 mL at 11/19/14 2226 .  sodium chloride 0.9 % injection 3 mL, 3 mL, Intravenous, Q12H, Erline Levine, MD, 3 mL at 11/20/14 1006 .  sodium chloride 0.9 % injection 3 mL, 3 mL, Intravenous, PRN, Erline Levine, MD .  sodium phosphate (FLEET) 7-19  GM/118ML enema 1 enema, 1 enema, Rectal, Once PRN, Erline Levine, MD .  traMADol Veatrice Bourbon) tablet 50 mg, 50 mg, Oral, 4 times per day, Judeth Horn, MD, 50 mg at 11/20/14 5916  Patients Current Diet: DIET DYS 3 Room service appropriate?: Yes; Fluid consistency:: Thin  Precautions / Restrictions Precautions Precautions: Cervical, Fall Precaution Comments: Monitor BP closely with BP control measures (wrapping, SCDs, Ted hose EOB) Cervical Brace: Hard collar, At all times Restrictions Weight Bearing Restrictions: No   Has the patient had 2 or more falls or a fall with injury in the past year?No  Prior Activity Level Community (5-7x/wk): Went out daily.  Had hobbies, went fishing, worked on [employer], worked on [employer].  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  Home Assistive Devices / Equipment Home Equipment: None  Prior Device Use: Indicate devices/aids used by the patient prior to current illness, exacerbation or injury? None of the above.  Was independent and driving PTA, no devices used.  Prior Functional Level Prior Function Level of Independence: Independent Comments: pastors a church in [place]: Did the patient need help bathing, dressing, using the toilet or eating?  Independent  Indoor Mobility: Did the patient need assistance with walking from room to room (with or without device)? Independent  Stairs: Did the patient need assistance with internal or external stairs (with or without device)? Independent  Functional Cognition: Did the patient need help planning regular tasks such as shopping or remembering to take medications? Independent  Current Functional Level Cognition  Overall Cognitive Status: Within Functional Limits for tasks assessed Orientation Level: Oriented X4    Extremity Assessment (includes Sensation/Coordination)  Upper Extremity Assessment: RUE deficits/detail RUE Deficits / Details: Rt UE:  deltoid 3+/5 - 4-/5; bicep tricep 3+/5  - 4-/5; sup/pron 2+/5; wrist flex/ext 3/5.  finger ext trace; finger flex trace RUE Sensation: decreased light touch RUE Coordination: decreased fine motor, decreased gross motor LUE Deficits / Details: Rt UE:  deltoid 3/5 -  bicep tricep 3/5 ; sup/pron 2+/5; wrist flex/ext 3/5.  finger ext 2/5; finger flex 1/5  LUE Sensation: decreased light touch  Lower Extremity Assessment: Defer to PT evaluation RLE Deficits / Details: PROM WFL no active movement noted throughout RLE Sensation: decreased light touch (intact proprioception at ankle) LLE Deficits / Details: PROM WFL no active movement noted throughout LLE Sensation: decreased light touch (intact proprioception at ankle)    ADLs  Overall ADL's : Needs assistance/impaired Eating/Feeding: Minimal assistance Eating/Feeding Details (indicate cue type and reason): Using lidded mug and universal cuff, pt was able to feed self with minA to guide Rt UE initially, and to prevent dropping of cup while drinking from cup.   Grooming: Wash/dry face, Minimal assistance, Bed level Grooming Details (indicate cue type and reason): using Rt UE  Upper Body Bathing: Total assistance, Bed level Lower Body Bathing: Total assistance, Bed level Upper Body Dressing : Total assistance, Bed level Lower Body Dressing: Total assistance, Bed level Toilet Transfer: Total assistance Toileting- Clothing Manipulation and Hygiene: Total assistance, Bed level General ADL Comments: Session focused on EOB sitting balance for trunk strengthening and use of BIL UE for support. Pt tolerated EOB with SCD for BP support. Pt with decr SBP > 20 points with EOB. Pt    Mobility  Overal bed mobility: Needs Assistance Bed Mobility: Rolling, Sidelying to Sit Rolling: Max assist, +2 for physical assistance Sidelying to sit: Max assist, +2 for physical assistance Supine to sit: +2 for physical assistance, Total assist, HOB elevated Sit to sidelying: Total assist, +2 for physical  assistance General bed mobility comments: Pt able to hook Rt arm with PT and assist with roll and pull to seated position. Required max assist to facilitate roll and for truncal control while rising to edge of bed.    Transfers  Overall transfer level: Needs assistance Equipment used: Ambulation equipment used General transfer comment: Maxi-Lift utilized    Ambulation / Gait / Stairs / Office manager / Balance Dynamic Sitting Balance Sitting balance - Comments: Sat EOB with 4/10 dizziness, BP 118/70 HR 57. Tolerated x 5 minutes. Balance Overall balance assessment: History of Falls, Needs assistance Sitting balance-Leahy Scale: Zero Sitting balance - Comments: Sat EOB with 4/10 dizziness, BP 118/70 HR 57. Tolerated x 5  minutes.    Special needs/care consideration BiPAP/CPAP No CPM No Continuous Drip IV No Dialysis No         Life Vest No Oxygen On 02 2L [place] in hospital, but no 02 at home Special Bed No Trach Size No Wound Vac (area) No    Skin Lacerations right eye, right elbow and a neck incision.  Cervical collar in place.               Bowel mgmt: Last BM 11/18/14, incontinent, loose  Bladder mgmt: Urinary catheter Diabetic mgmt No    Previous Home Environment Living Arrangements: Spouse/significant other Available Help at Discharge: Family Type of Home: House Home Layout: Multi-level, Full bath on main level, Able to live on main level with bedroom/bathroom Alternate Level Stairs-Number of Steps: don't go up to second level, has a basement Home Access: Stairs to enter Entrance Stairs-Rails: Left, Right Entrance Stairs-Number of Steps: 6 Bathroom Shower/Tub: Tub only Biochemist, clinical: [place]: No  Discharge Living Setting Plans for Discharge Living Setting: Patient's home, House, Lives with (comment) (Lives with wife.  Daughter across the street.) Type of Home at Discharge: House Discharge Home Layout: Multi-level, Laundry or work  area in basement, Full bath on main level, Able to live on main level with bedroom/bathroom (Two levels with a basement as well.) Alternate Level Stairs-Number of Steps: Flight Discharge Home Access: Stairs to enter [employer] of Steps: 5-6 steps Does the patient have any problems obtaining your medications?: No  Social/Family/Support Systems Patient Roles: Spouse, Parent (Has a wife and 2 daughters.) Contact Information: Pat Pasko - wife Anticipated Caregiver: wife Anticipated Caregiver's Contact Information: Fraser Din - wife (c) [phone] Ability/Limitations of Caregiver: Wife is retired and can assist. Careers adviser: 24/7 Discharge Plan Discussed with Primary Caregiver: Yes Is Caregiver In Agreement with Plan?: Yes Does Caregiver/Family have Issues with Lodging/Transportation while Pt is in Rehab?: No  Goals/Additional Needs Patient/Family Goal for Rehab: PT/OT min/mod assist wheel chair level goals Expected length of stay: 22-28 days Cultural Considerations: Doristine Bosworth of [place], a [employer]. Dietary Needs: Dys 3, thin liquids Equipment Needs: TBD Pt/Family Agrees to Admission and willing to participate: Yes Program Orientation Provided & Reviewed with Pt/Caregiver Including Roles  & Responsibilities: Yes  Decrease burden of Care through IP rehab admission: N/A  Possible need for SNF placement upon discharge: Not planned  Patient Condition: This patient's medical and functional status has changed since the consult dated: 11/16/14 in which the Rehabilitation Physician determined and documented that the patient's condition is appropriate for intensive rehabilitative care in an inpatient rehabilitation facility. See "History of Present Illness" (above) for medical update. Functional changes are: Currently requiring max/ total assist +2 for mobility. Patient's medical and functional status update has been discussed with the Rehabilitation  physician and patient remains appropriate for inpatient rehabilitation. Will admit to inpatient rehab today.  Preadmission Screen Completed By:  Retta Diones, 11/20/2014 1:00 PM ______________________________________________________________________   Discussed status with Dr. Naaman Plummer on 11/20/14 at 1259 and received telephone approval for admission today.  Admission Coordinator:  Retta Diones, time1300/Date08/15/16

## 2014-11-20 NOTE — Progress Notes (Signed)
Rehab admissions - We have authorization from fed BCBS for acute inpatient rehab admission for today.  Bed available and will admit to inpatient rehab today.  Call me for questions.  #[phone]

## 2014-11-20 NOTE — Progress Notes (Signed)
Rehab admissions - I have submitted clinicals to [facility] for review requesting acute inpatient rehab admission.  I will follow up with all once I hear back from insurance carrier.  Call me for questions.  #[phone]

## 2014-11-20 NOTE — Progress Notes (Signed)
Trauma Service Note  Subjective: Patient spiked a fever yesterday up to 102.9.  No known source and the patient does not look sick.  Objective: Vital signs in last 24 hours: Temp:  [98.5 F (36.9 C)-102.9 F (39.4 C)] 99.3 F (37.4 C) (08/15 0951) Pulse Rate:  [49-70] 64 (08/15 0951) Resp:  [18-20] 18 (08/15 0951) BP: (98-109)/(42-59) 107/49 mmHg (08/15 0951) SpO2:  [90 %-100 %] 96 % (08/15 0951) Last BM Date: 11/18/14  Intake/Output from previous day:   Intake/Output this shift:    General: No acute distress.  Eating with the therapist currently  Lungs: Clear  Abd: Benign  Extremities: The same  Neuro: The same deficits.  Lab Results: CBC  No results for input(s): WBC, HGB, HCT, PLT in the last 72 hours. BMET No results for input(s): NA, K, CL, CO2, GLUCOSE, BUN, CREATININE, CALCIUM in the last 72 hours. PT/INR No results for input(s): LABPROT, INR in the last 72 hours. ABG No results for input(s): PHART, HCO3 in the last 72 hours.  Invalid input(s): PCO2, PO2  Studies/Results: Dg Chest Port 1 View  11/19/2014   CLINICAL DATA:  Fever, fall, spinal cord injury, quadriparesis  EXAM: PORTABLE CHEST - 1 VIEW  COMPARISON:  11/16/2014  FINDINGS: Lungs are essentially clear. No focal consolidation. No pleural effusion or pneumothorax.  The heart is top-normal in size.  Left arm PICC terminates in the mid SVC.  Cervical spine fixation hardware.  IMPRESSION: No evidence of acute cardiopulmonary disease.  Left arm PICC terminates in the mid SVC.   Electronically Signed   By: Charline Bills M.D.   On: 11/19/2014 18:33    Anti-infectives: Anti-infectives    Start     Dose/Rate Route Frequency Ordered Stop   11/14/14 2300  vancomycin (VANCOCIN) IVPB 1000 mg/200 mL premix     1,000 mg 200 mL/hr over 60 Minutes Intravenous  Once 11/14/14 1712 11/14/14 2353   11/14/14 1228  vancomycin (VANCOCIN) 1 GM/200ML IVPB    Comments:  [name]   : cabinet override   11/14/14 1228 11/15/14 0044      Assessment/Plan: s/p Procedure(s): Cervical Five-Cervical Six  Anterior Cervical Decompression/Diskectomy/Fusion Will continue to try to identify source of the fever.  LOS: 11 days   [name]. Gae Bon, MD, FACS [phone] Trauma Surgeon 11/20/2014

## 2014-11-20 NOTE — Interval H&P Note (Signed)
Carl [name] was admitted today to Inpatient Rehabilitation with the diagnosis of C6 SCI.  The patient's history has been reviewed, patient examined, and there is no change in status.  Patient continues to be appropriate for intensive inpatient rehabilitation.  I have reviewed the patient's chart and labs.  Questions were answered to the patient's satisfaction. The PAPE has been reviewed and assessment remains appropriate.  SWARTZ,ZACHARY T 11/20/2014, 7:48 PM

## 2014-11-21 ENCOUNTER — Inpatient Hospital Stay (HOSPITAL_COMMUNITY): Payer: Federal, State, Local not specified - PPO | Admitting: Occupational Therapy

## 2014-11-21 ENCOUNTER — Inpatient Hospital Stay (HOSPITAL_COMMUNITY): Payer: Federal, State, Local not specified - PPO

## 2014-11-21 ENCOUNTER — Ambulatory Visit (HOSPITAL_COMMUNITY): Payer: Federal, State, Local not specified - PPO | Attending: Physical Medicine and Rehabilitation

## 2014-11-21 DIAGNOSIS — T148 Other injury of unspecified body region: Secondary | ICD-10-CM | POA: Diagnosis not present

## 2014-11-21 DIAGNOSIS — R509 Fever, unspecified: Secondary | ICD-10-CM | POA: Insufficient documentation

## 2014-11-21 DIAGNOSIS — X58XXXD Exposure to other specified factors, subsequent encounter: Secondary | ICD-10-CM | POA: Insufficient documentation

## 2014-11-21 DIAGNOSIS — N179 Acute kidney failure, unspecified: Secondary | ICD-10-CM

## 2014-11-21 LAB — CBC WITH DIFFERENTIAL/PLATELET
Basophils Absolute: 0 10*3/uL (ref 0.0–0.1)
Basophils Relative: 0 % (ref 0–1)
Eosinophils Absolute: 0.4 10*3/uL (ref 0.0–0.7)
Eosinophils Relative: 2 % (ref 0–5)
HCT: 30.1 % — ABNORMAL LOW (ref 39.0–52.0)
HEMOGLOBIN: 10.3 g/dL — AB (ref 13.0–17.0)
LYMPHS ABS: 0.6 10*3/uL — AB (ref 0.7–4.0)
LYMPHS PCT: 4 % — AB (ref 12–46)
MCH: 30.7 pg (ref 26.0–34.0)
MCHC: 34.2 g/dL (ref 30.0–36.0)
MCV: 89.9 fL (ref 78.0–100.0)
Monocytes Absolute: 1 10*3/uL (ref 0.1–1.0)
Monocytes Relative: 6 % (ref 3–12)
NEUTROS PCT: 88 % — AB (ref 43–77)
Neutro Abs: 14.8 10*3/uL — ABNORMAL HIGH (ref 1.7–7.7)
Platelets: 241 10*3/uL (ref 150–400)
RBC: 3.35 MIL/uL — AB (ref 4.22–5.81)
RDW: 13.2 % (ref 11.5–15.5)
WBC: 16.7 10*3/uL — AB (ref 4.0–10.5)

## 2014-11-21 LAB — BASIC METABOLIC PANEL
Anion gap: 11 (ref 5–15)
BUN: 67 mg/dL — AB (ref 6–20)
CALCIUM: 8.4 mg/dL — AB (ref 8.9–10.3)
CO2: 25 mmol/L (ref 22–32)
CREATININE: 2.99 mg/dL — AB (ref 0.61–1.24)
Chloride: 97 mmol/L — ABNORMAL LOW (ref 101–111)
GFR calc non Af Amer: 21 mL/min — ABNORMAL LOW (ref >60)
GFR, EST AFRICAN AMERICAN: 24 mL/min — AB (ref >60)
Glucose, Bld: 118 mg/dL — ABNORMAL HIGH (ref 65–99)
Potassium: 5.2 mmol/L — ABNORMAL HIGH (ref 3.5–5.1)
Sodium: 133 mmol/L — ABNORMAL LOW (ref 135–145)

## 2014-11-21 LAB — COMPREHENSIVE METABOLIC PANEL
ALK PHOS: 215 U/L — AB (ref 38–126)
ALT: 41 U/L (ref 17–63)
AST: 24 U/L (ref 15–41)
Albumin: 2.1 g/dL — ABNORMAL LOW (ref 3.5–5.0)
Anion gap: 8 (ref 5–15)
BUN: 61 mg/dL — AB (ref 6–20)
CALCIUM: 8.1 mg/dL — AB (ref 8.9–10.3)
CO2: 26 mmol/L (ref 22–32)
CREATININE: 3.16 mg/dL — AB (ref 0.61–1.24)
Chloride: 96 mmol/L — ABNORMAL LOW (ref 101–111)
GFR, EST AFRICAN AMERICAN: 22 mL/min — AB (ref >60)
GFR, EST NON AFRICAN AMERICAN: 19 mL/min — AB (ref >60)
Glucose, Bld: 107 mg/dL — ABNORMAL HIGH (ref 65–99)
Potassium: 5.2 mmol/L — ABNORMAL HIGH (ref 3.5–5.1)
SODIUM: 130 mmol/L — AB (ref 135–145)
Total Bilirubin: 1.3 mg/dL — ABNORMAL HIGH (ref 0.3–1.2)
Total Protein: 5.1 g/dL — ABNORMAL LOW (ref 6.5–8.1)

## 2014-11-21 MED ORDER — ENOXAPARIN SODIUM 30 MG/0.3ML ~~LOC~~ SOLN
30.0000 mg | SUBCUTANEOUS | Status: DC
  Administered 2014-11-21: 30 mg via SUBCUTANEOUS
  Filled 2014-11-21: qty 0.3

## 2014-11-21 MED ORDER — SODIUM CHLORIDE 0.9 % IV SOLN
INTRAVENOUS | Status: DC
  Administered 2014-11-21: 100 mL/h via INTRAVENOUS
  Administered 2014-11-21 – 2014-11-23 (×5): via INTRAVENOUS

## 2014-11-21 MED ORDER — SENNA 8.6 MG PO TABS
1.0000 | ORAL_TABLET | Freq: Every day | ORAL | Status: DC
  Administered 2014-11-21 – 2014-11-23 (×3): 8.6 mg via ORAL
  Filled 2014-11-21 (×3): qty 1

## 2014-11-21 NOTE — Evaluation (Signed)
Physical Therapy Assessment and Plan  Patient Details  Name: Carl [name] MRN: [id number] Date of Birth: 1950-08-14  PT Diagnosis: Abnormal posture, Difficulty walking, Edema, Impaired sensation, Muscle weakness, Pain in cervical region, Paralysis and Quadriplegia Rehab Potential: Good ELOS: 4 weeks   Today's Date: 11/21/2014 PT Individual Time: 1000-1100 PT Individual Time Calculation (min): 60 min    Problem List:  Patient Active Problem List   Diagnosis Date Noted  . C6 spinal cord injury 11/20/2014  . Neurogenic bowel 11/20/2014  . Neurogenic bladder 11/20/2014  . Fever of unknown origin   . Quadriplegia 11/14/2014  . C5 vertebral fracture 11/09/2014    Past Medical History:  Past Medical History  Diagnosis Date  . Bradycardia   . Chronic abdominal pain   . Hesitancy of micturition    Past Surgical History:  Past Surgical History  Procedure Laterality Date  . Anterior cervical decomp/discectomy fusion N/A 11/14/2014    Procedure: Cervical Five-Cervical Six  Anterior Cervical Decompression/Diskectomy/Fusion;  Surgeon: Erline Levine, MD;  Location: [place] [facility];  Service: Neurosurgery;  Laterality: N/A;    Assessment & Plan Clinical Impression: Patient is a 64 y.o. year old male with recent admission to the hospital male who fell from the roof while painting hsi house with complaints of neck pain and inability to move BLE wiand weakness BUE. Patient hypotensive at admission requiring dopamine and GCS 15. Bedside ultrasound negative and patient with poor air movement with poor respiratory effort. Work up revealed comminuted left C5 and C6 fractures with prevertebral and paraspinal edema, extensive cervical strain and cervical cord contusion with swelling extending C5-C6 with intramedullary hematoma, nondisplaced right 9th and 10th ribs and right eyelid laceration. Patient with numbness BLE as well as numbness/pain in chest and abdominal wall. Right brow lacerations repaired by  Dr. Prudencio Burly and no signs of globe injury noted. He was placed on bedrest with Aspen collar in place and underwent ACDF with PEEk cage by Dr. Vertell Limber on 08/09. He has had bradycardia with hypotension due to neurogenic shock and was started on steriods with improvement in symptoms. Had fever 102.9 last pm and CXR negative for infection. Urine culture ordered and pending. Therapy ongoing and patient showing improvement in tolerance of activity at EOB. CIR recommended by MD and rehab team and patient . Patient transferred to CIR on 11/20/2014 .   Patient currently requires total +2 with mobility secondary to muscle weakness, muscle joint tightness and muscle paralysis, decreased cardiorespiratoy endurance and decreased oxygen support, impaired timing and sequencing and decreased coordination and decreased sitting balance, decreased standing balance, decreased postural control and decreased balance strategies.  Prior to hospitalization, patient was independent  with mobility and lived with Spouse in a House home.  Home access is 6Stairs to enter.  Patient will benefit from skilled PT intervention to maximize safe functional mobility, minimize fall risk and decrease caregiver burden for planned discharge home with 24 hour supervision.  Anticipate patient will benefit from follow up [place] at discharge.  PT - End of Session Activity Tolerance: Decreased this session Endurance Deficit: Yes Endurance Deficit Description: decreased overall endurance and BP monitored throughout session. ACE wraps used on BLE for BP control PT Assessment Rehab Potential (ACUTE/IP ONLY): Good Barriers to Discharge: Decreased caregiver support (depending on level of physical A pt requires for wife to A) PT Patient demonstrates impairments in the following area(s): Balance;Edema;Endurance;Motor;Pain;Safety;Sensory;Skin Integrity PT Transfers Functional Problem(s): Bed Mobility;Bed to Chair;Car;Furniture PT Locomotion Functional Problem(s):  Ambulation;Wheelchair Mobility;Stairs PT Plan PT Intensity:  Minimum of 1-2 x/day ,45 to 90 minutes PT Frequency: 5 out of 7 days PT Duration Estimated Length of Stay: 4 weeks PT Treatment/Interventions: Ambulation/gait training;Balance/vestibular training;Community reintegration;Discharge planning;Disease management/prevention;DME/adaptive equipment instruction;Functional mobility training;Neuromuscular re-education;Pain management;Patient/family education;Psychosocial support;Skin care/wound management;Splinting/orthotics;Stair training;Therapeutic Activities;Therapeutic Exercise;UE/LE Strength taining/ROM;UE/LE Coordination activities;Wheelchair propulsion/positioning PT Transfers Anticipated Outcome(s): min A basic transfers; mod +2 for car transfer PT Locomotion Anticipated Outcome(s): min A w/c mobility in manual w/c; may require power w/c depending on functional return PT Recommendation Recommendations for Other Services: Neuropsych consult Follow Up Recommendations: Home health PT;24 hour supervision/assistance Patient destination: Home Equipment Recommended: Sliding board;Wheelchair cushion (measurements);Wheelchair (measurements);Other (comment) (may need hospital bed)  Skilled Therapeutic Intervention Individual treatment initiated with focus on bed mobility, neuro re-ed for sitting balance and postural control while seated EOB with UE support, transfer with slideboard, OOB tolerance, discussion of goals and PT POC, education on pressure relief in tilt in space w/c and importance of OOB tolerance to build tolerance/BP control. See below for details of the above treatment interventions.   PT Evaluation Precautions/Restrictions Precautions Precautions: Cervical;Fall Precaution Comments: Using BLE ACE wraps for BP control at this time. Monitor this Required Braces or Orthoses: Cervical Brace Cervical Brace: Hard collar;At all times Restrictions Weight Bearing Restrictions: No  Vital  Signs Supine: 105/67mHg; HR = 43bpm; O2 on 2L = 96% Seated EOB: 113/61mg; HR = 47bpm; O2 on 2L = 97% Seated in w/c: 115/6227m; HR = 49bpm Pain C/o some cervical pain but premedicated and did not inhibit therapy. Home Living/Prior Functioning Home Living Available Help at Discharge: Family Type of Home: House Home Access: Stairs to enter [employer] Steps: 6 Entrance Stairs-Rails: Left;Right Home Layout: Multi-level;Full bath on main level;Able to live on main level with bedroom/bathroom Alternate Level Stairs-Number of Steps: doesn't go up to the second level Additional Comments: Ramp is already in the works to be installed.  Lives With: Spouse Prior Function Level of Independence: Independent with basic ADLs;Independent with gait;Independent with transfers  Able to Take Stairs?: Yes Leisure: Hobbies-yes (Comment) Comments[place] go out on [place] Cognition Overall Cognitive Status: Within Functional Limits for tasks assessed Sensation Sensation Light Touch Impaired Details: Impaired RUE;Impaired LUE;Impaired RLE;Impaired LLE (able to distinguish LT in BLE but reports tingling) Proprioception: Impaired by gross assessment (inconsistently able to identify ) Coordination Gross Motor Movements are Fluid and Coordinated: No Motor  Motor Motor: Abnormal postural alignment and control;Tetraplegia Motor - Skilled Clinical Observations: Incomplete quadriplegia at C5-C6     Trunk/Postural Assessment  Cervical Assessment Cervical Assessment: Exceptions to WFLBound[facility] Community Hospitalard collar at all times) Thoracic Assessment Thoracic Assessment: Exceptions to WFL[facility] Phil[facility]Ph[facility] - Havertownlexed posture due to impaired trunk control) Lumbar Assessment Lumbar Assessment: Exceptions to [facility]FL[facility] Mesait with posterior tilt; decreased trunk control) Postural Control Postural Control: Deficits on evaluation Trunk Control: decreased due to SCI Protective Responses: delayed  and inadequate  Balance Balance Balance Assessed: Yes Static Sitting Balance Static Sitting - Level of Assistance: 1: +1 Total assist;3: Mod assist;2: Max assist Dynamic Sitting Balance Dynamic Sitting - Level of Assistance: 1: +2 Total assist Static Standing Balance Static Standing - Level of Assistance: Not tested (comment) Dynamic Standing Balance Dynamic Standing - Level of Assistance: Not tested (comment) Extremity Assessment  RUE Assessment RUE Assessment: Exceptions to WFLB[facility]acon[facility] HospitalE AROM (degrees) Overall AROM Right Upper Extremity: Deficits RUE Overall AROM Comments: Able to achieve full ROM in R UE, unable to complete finger opposition RUE Strength RUE Overall Strength:  Deficits RUE Overall Strength Comments: 4+/5 shoulder, 4+/5 elbow, 3/5 wrist   RLE Assessment RLE Assessment: Exceptions to [facility] RLE Strength RLE Overall Strength Comments: pt able to wiggle toes and initiate gravity eliminated adduction in supine only; no other active movement noted LLE Assessment LLE Assessment: Exceptions to [facility] LLE Strength LLE Overall Strength Comments: no active movement noted  Function: Naval architect activity did not occur: No continent bowel/bladder event     Bed Mobility Roll left and right activity   Assist level: 2 helpers  Sit to lying activity   Assist level: 2 helpers  Lying to sitting activity   Assist level: 2 helpers  Mobility details Bed mobility details: Tactile cues for placement;Verbal cues for techniques;Verbal cues for safe use of DME/AE;Verbal cues for precautions/safety;Manual facilitation for weight shifting   Transfers Sit to stand transfer Sit to stand activity did not occur: Safety/medical concerns      Chair/bed transfer   Chair/bed transfer method: Lateral scoot Chair/bed transfer assist level: 2 helpers Chair/bed transfer assistive device: Sliding board;Orthosis;Armrests   Chair/bed transfer details: Visual cues for safe use of  DME/AE;Verbal cues for technique;Verbal cues for precautions/safety;Verbal cues for safe use of DME/AE;Manual facilitation for weight shifting;Manual facilitation for weight bearing   Toilet transfer Toilet transfer activity did not occur: Safety/medical concerns      Scientist, product/process development transfer activity did not occur: Safety/medical concerns (incomplete quadriplegia)        Locomotion Ambulation Ambulation activity did not occur: Safety/medical concerns (incomplete quadriplegia)        Walk 10 feet activity Walk 10 feet activity did not occur: Safety/medical concerns    Walk 50 feet with 2 turns activity Walk 50 feet with 2 turns activity did not occur: Safety/medical concerns    Walk 150 feet activity Walk 150 feet activity did not occur: Safety/medical concerns    Walk 10 feet on uneven surfaces activity Walk 10 feet on uneven surfaces activity did not occur: Safety/medical concerns    Stairs Stairs activity did not occur: Safety/medical concerns (incomplete quadriplegia)        Walk up/down 1 step activity Walk up/down 1 step or curb (drop down) activity did not occur: Safety/medical concerns Stairs activity did not occur: Safety/medical concerns (incomplete quadriplegia)    Walk up/down 4 steps activity Walk up/down 4 steps activity did not occur: Safety/medical concerns    Walk up/down 12 steps activity Walk up/down 12 steps activity did not occur: Safety/medical concerns    Pick up small objects from floor Pick up small object from the floor (from standing position) activity did not occur: Safety/medical concerns    Wheelchair   Type: Manual   Assist Level: Dependent (Pt equals 0%) (tilt in space w/c)  Wheel 50 feet with 2 turns activity   Assist Level: Dependent (Pt equals 0%)  Wheel 150 feet activity   Assist Level: Dependent (Pt equals 0%)   Cognition Comprehension Comprehension assist level: Follows basic conversation/direction with no assist  Expression  Expression assist level: Expresses basic needs/ideas: With extra time/assistive device  Social Interaction Social Interaction assist level: Interacts appropriately with others with medication or extra time (anti-anxiety, antidepressant).  Problem Solving Problem solving assist level: Solves basic 75 - 89% of the time/requires cueing 10 - 24% of the time  Memory Memory assist level: Complete Independence: No helper    Refer to Care Plan for Long Term Goals  Recommendations for other services: Neuropsych  Discharge Criteria: Patient will be discharged from PT  if patient refuses treatment 3 consecutive times without medical reason, if treatment goals not met, if there is a change in medical status, if patient makes no progress towards goals or if patient is discharged from hospital.  The above assessment, treatment plan, treatment alternatives and goals were discussed and mutually agreed upon: by patient and by family  Juanna Cao, PT, DPT  11/21/2014, 4:04 PM

## 2014-11-21 NOTE — Progress Notes (Signed)
Orthopedic Tech Progress Note Patient Details:  Carl [name] 1950/10/05 [id number]  Patient ID: Carl [name], male   DOB: [birth year], 64 y.o.   MRN: [id number] Called in advanced brace order; spoke with Carl [name], Carl [name] 11/21/2014, 9:27 AM

## 2014-11-21 NOTE — Patient Care Conference (Signed)
Inpatient RehabilitationTeam Conference and Plan of Care Update Date: 11/21/2014   Time: 2:10 PM    Patient Name: Carl [name]      Medical Record Number: [id number]  Date of Birth: November 18, 1950 Sex: Male         Room/Bed: 4M07C/4M07C-01 Payor Info: Payor: BLUE CROSS BLUE SHIELD / Plan: BCBS/[employer] PPO / Product Type: *No Product type* /    Admitting Diagnosis: C5-6 FX WITH INCOMPLETE SCI   Admit Date/Time:  11/20/2014  5:57 PM Admission Comments: No comment available   Primary Diagnosis:  C6 spinal cord injury Principal Problem: C6 spinal cord injury  Patient Active Problem List   Diagnosis Date Noted  . C6 spinal cord injury 11/20/2014  . Neurogenic bowel 11/20/2014  . Neurogenic bladder 11/20/2014  . Fever of unknown origin   . Quadriplegia 11/14/2014  . C5 vertebral fracture 11/09/2014    Expected Discharge Date: Expected Discharge Date: 12/19/14  Team Members Present: Physician leading conference: Dr. Faith Rogue Social Worker Present: Amada Jupiter, LCSW Nurse Present: Chana Bode, RN PT Present: Karolee Stamps, Varney Biles, PT OT Present: Donzetta Kohut, OT;Other (comment) Johnsie Cancel, OT) PPS Coordinator present : Tora Duck, RN, CRRN     Current Status/Progress Goal Weekly Team Focus  Medical   c6 ASIA C SCI, neurogenic bowel and bladder. ARF  improve activity tolerance  volume resuscitation, renal mgt, nutrition, skin   Bowel/Bladder   incontinent of bowel, foley catheter   continent of bowela nd bladder with max assist  offer toileting q2h, continue foley care   Swallow/Nutrition/ Hydration             ADL's   Total A +2 for bathing, dressing, and functional transfers  Min A overall  Functional transfers, self-feeding, AE use for ADL re-education   Mobility   total A +2  min A w/c level  sitting balance, bed mobilty, neuro re-ed, OOB tolerance, education, pressure relief, strengthening, endurance   Communication             Safety/Cognition/  Behavioral Observations            Pain   no complaints of pain, scheduled tramadol  pain less than or equal to 4 on a scale of 0-10  assess for pain q4h, medicate as indicated   Skin   incision to anterior neck, no other signs of skin injury/breakdown  no new skin injury/breakdown  assess skin q shift, keep skin clean and dry    Rehab Goals Patient on target to meet rehab goals: Yes *See Care Plan and progress notes for long and short-term goals.  Barriers to Discharge: profound defcitis    Possible Resolutions to Barriers:  ongoing NMR, adpative equipment, family ed    Discharge Planning/Teaching Needs:  new admit with plan to d/c home with wife to provide 24/7 assistance      Team Discussion:  New eval.  Need to push po fluids due to kidney issues.  Depressed? Recommend neuropsych consult.  Aiming for min/ mod w/c level and ADL goals.  Revisions to Treatment Plan:  NOne   Continued Need for Acute Rehabilitation Level of Care: The patient requires daily medical management by a physician with specialized training in physical medicine and rehabilitation for the following conditions: Daily direction of a multidisciplinary physical rehabilitation program to ensure safe treatment while eliciting the highest outcome that is of practical value to the patient.: Yes Daily medical management of patient stability for increased activity during participation in an  intensive rehabilitation regime.: Yes Daily analysis of laboratory values and/or radiology reports with any subsequent need for medication adjustment of medical intervention for : Neurological problems;Post surgical problems  Rima Blizzard 11/21/2014, 4:56 PM

## 2014-11-21 NOTE — Progress Notes (Signed)
Physical Therapy Session Note  Patient Details  Name: Carl [name] MRN: [id number] Date of Birth: 1950/11/26  Today's Date: 11/21/2014 PT Individual Time: 1135-1210 PT Individual Time Calculation (min): 35 min   Short Term Goals: Week 1:     Skilled Therapeutic Interventions/Progress Updates:    Patient up in wheelchair with wife present.  Pushed patient in chair to therapy gym with wife encouraged to follow to watch session.  Patient transferred to mat via slide board transfer +2 total assist, patient worked on sitting balance at edge of mat mod assist to supervision.  Sat about 1 minute with supervision with UE assist, then loss of balance when talking min support and mod cuesto regain.  Patient transferred back to chair +2 total assist with sliding board, then in room back to bed also with sliding board.  Sit to side to supine +2 total assist and cues for technique.  Positioned with pillows for comfort and left with RN and wife in room.  Therapy Documentation Precautions:  Precautions Precautions: Cervical, Fall Precaution Comments: Monitor BP closely with BP control measures (wrapping, SCDs, Ted hose EOB) Required Braces or Orthoses: Cervical Brace Cervical Brace: Hard collar, At all times Restrictions Weight Bearing Restrictions: No Vital Signs: Therapy Vitals Pulse Rate: (!) 42 BP: (!) 114/48 mmHg Patient Position (if appropriate): Sitting Oxygen Therapy SpO2: 98 % O2 Device: Nasal Cannula O2 Flow Rate (L/min): 2 L/min Pain: Pain Assessment Pain Assessment: 0-10 Pain Score: 2  Pain Type: Surgical pain Pain Location: Generalized Pain Intervention(s): Repositioned;Rest Mobility: Bed Mobility Bed Mobility: Rolling Right;Rolling Left Rolling Right: 1: +2 Total assist Rolling Right: Patient Percentage: 20% Rolling Right Details: Manual facilitation for weight shifting;Manual facilitation for placement;Verbal cues for sequencing;Verbal cues for technique;Tactile cues for  weight shifting Rolling Left: 1: +2 Total assist Rolling Left: Patient Percentage: 20%   Function:    Bed Mobility    Sit to lying activity   Assist level: 2 helpers         Transfers    Chair/bed transfer   Chair/bed transfer method: Lateral scoot Chair/bed transfer assist level: 2 helpers Chair/bed transfer assistive device: Sliding board                Cognition Comprehension Comprehension assist level: Follows basic conversation/direction with no assist  Expression Expression assist level: Expresses basic needs/ideas: With extra time/assistive device  Social Interaction Social Interaction assist level: Interacts appropriately with others with medication or extra time (anti-anxiety, antidepressant).  Problem Solving Problem solving assist level: Solves basic 75 - 89% of the time/requires cueing 10 - 24% of the time  Memory Memory assist level: Complete Independence: No helper    Therapy/Group: Individual Therapy  Rudi Coco [place], [place] [phone] 11/21/2014  11/21/2014, 12:48 PM

## 2014-11-21 NOTE — Progress Notes (Signed)
Nutrition Brief Note  Patient identified on the Malnutrition Screening Tool (MST) Report  Wt Readings from Last 15 Encounters:  11/16/14 200 lb (90.719 kg)   Pt reports usual body weight of ~195 lbs.   Current diet order is dysphagia 3, patient is consuming approximately 100% of meals at this time. Pt reports eating fine at home PTA with 3 meals daily and no other difficulties. Labs and medications reviewed.   No nutrition interventions warranted at this time. If nutrition issues arise, please consult RD.   Roslyn Smiling, MS, RD, LDN Pager # [phone] After hours/ weekend pager # [phone]

## 2014-11-21 NOTE — Progress Notes (Signed)
VASCULAR LAB PRELIMINARY  PRELIMINARY  PRELIMINARY  PRELIMINARY  Bilateral lower extremity venous duplex  completed.    Preliminary report:  Bilateral:  No evidence of DVT, superficial thrombosis, or Baker's Cyst.    Chantel Teti, RVT 11/21/2014, 4:11 PM

## 2014-11-21 NOTE — Progress Notes (Signed)
Patient information reviewed and entered into eRehab system by Anesa Fronek, RN, CRRN, PPS Coordinator.  Information including medical coding and functional independence measure will be reviewed and updated through discharge.    

## 2014-11-21 NOTE — Evaluation (Signed)
Occupational Therapy Assessment and Plan  Patient Details  Name: Ayo Smoak MRN: [id number] Date of Birth: 1951-01-28  OT Diagnosis: abnormal posture, acute pain, muscle weakness (generalized), pain in joint and paraplegia at level C5-C6 Rehab Potential: Rehab Potential (ACUTE ONLY): Good ELOS: 4 weeks   Today's Date: 11/21/2014 OT Individual [id number]  1430-1510 OT Individual Time Calculation (min): 60 min and 40 min     Problem List:  Patient Active Problem List   Diagnosis Date Noted  . C6 spinal cord injury 11/20/2014  . Neurogenic bowel 11/20/2014  . Neurogenic bladder 11/20/2014  . Fever of unknown origin   . Quadriplegia 11/14/2014  . C5 vertebral fracture 11/09/2014    Past Medical History:  Past Medical History  Diagnosis Date  . Bradycardia   . Chronic abdominal pain   . Hesitancy of micturition    Past Surgical History:  Past Surgical History  Procedure Laterality Date  . Anterior cervical decomp/discectomy fusion N/A 11/14/2014    Procedure: Cervical Five-Cervical Six  Anterior Cervical Decompression/Diskectomy/Fusion;  Surgeon: Erline Levine, MD;  Location: [facility];  Service: Neurosurgery;  Laterality: N/A;    Assessment & Plan Clinical Impression: Aleksandr Piechowski is a 64 y.o. male who fell from the roof while painting hsi house with complaints of neck pain and inability to move BLE wiand weakness BUE. Patient hypotensive at admission requiring dopamine and GCS 15. Bedside ultrasound negative and patient with poor air movement with poor respiratory effort. Work up revealed comminuted left C5 and C6 fractures with prevertebral and paraspinal edema, extensive cervical strain and cervical cord contusion with swelling extending C5-C6 with intramedullary hematoma, nondisplaced right 9th and 10th ribs and right eyelid laceration. Patient with numbness BLE as well as numbness/pain in chest and abdominal wall. Right brow lacerations repaired by Dr. Prudencio Burly and no  signs of globe injury noted. He was placed on bedrest with Aspen collar in place and underwent ACDF with PEEk cage by Dr. Vertell Limber on 08/09. He has had bradycardia with hypotension due to neurogenic shock and was started on steriods with improvement in symptoms. Had fever 102.9 last pm and CXR negative for infection. Urine culture ordered and pending. Therapy ongoing and patient showing improvement in tolerance of activity at EOB. CIR recommended by MD and rehab team and patient admitted today.  Patient transferred to CIR on 11/20/2014 .    Patient currently requires total with basic self-care skills secondary to muscle weakness and muscle paralysis, decreased cardiorespiratoy endurance, abnormal tone, unbalanced muscle activation and decreased coordination and decreased sitting balance, decreased postural control, decreased balance strategies and difficulty maintaining precautions.  Prior to hospitalization, patient could complete ADls/IADLs with independent .  Patient will benefit from skilled intervention to decrease level of assist with basic self-care skills and increase independence with basic self-care skills prior to discharge home with care partner.  Anticipate patient will require 24 hour supervision, minimal physical assistance and moderate physical assestance and follow up home health.      Skilled Therapeutic Intervention Session One: Pt seen for OT eval and ADL bathing and dressing session. Pt and wife educated throughout session regarding role of OT, POC, OT goals, SCI, decreased sensation, CIR, and d/c planning. Pt required max- total A +2 for functional tasks, see below for detailed assist level. Pt demonstrated ability to participate/ assist with bed mobility, able to use B UEs to assist with rolling in bed with verbal cues for technique and max facilition for movement.  Pt left in supine at  end of session for Upcoming procedure, all needs in reach.   Session Two: Pt seen for OT therapy  session focusing on fine motor coordination and functional transfers. Pt in w/c upon arrival, voicing increased fatigue, however, agreeable to tx session. He was set-up and educated regarding peg board test. However, following 2:30 minutes, pt unable to manipulate pegs, unable to pick up pegs with either hand. Pt returned to bed at end of session, with +2 total A for sliding board transfer and +2 for return to supine. See below for BP throughout session. Pt left in supine at end of session, all needs in reach. NT made aware of pt requested to be checked for BM.    OT Evaluation Precautions/Restrictions  Precautions Precautions: Cervical;Fall Precaution Comments: Using BLE ACE wraps for BP control at this time. Monitor this Required Braces or Orthoses: Cervical Brace Cervical Brace: Hard collar;At all times Restrictions Weight Bearing Restrictions: No Vital Signs BP: 104/51 (reclined in w/c) 96/44 and 91/43 (w/c in upright position) 108/61 (supine)  Pain Pain Assessment: No/ denies pain  Home Living/Prior Functioning Home Living Family/patient expects to be discharged to:: Private residence Living Arrangements: Spouse/significant other Available Help at Discharge: Family Type of Home: House Home Access: Stairs to enter Technical brewer of Steps: 6 Entrance Stairs-Rails: Left, Right Home Layout: Multi-level, Full bath on main level, Able to live on main level with bedroom/bathroom Alternate Level Stairs-Number of Steps: doesn't go up to the second level Bathroom Shower/Tub: Tub only Biochemist, clinical: Standard Bathroom Accessibility: Yes Additional Comments: Ramp is already in the works to be installed.  Lives With: Spouse IADL History Homemaking Responsibilities: Yes Current License: Yes Prior Function Level of Independence: Independent with basic ADLs, Independent with gait, Independent with transfers  Able to Take Stairs?: Yes Leisure: Hobbies-yes (Comment) Comments:  Doristine Bosworth in a church in  [place]. Likes to fish and go out on the boat Vision/Perception  Vision- History Baseline Vision/History: Wears glasses Wears Glasses: Reading only Patient Visual Report: No change from baseline  Cognition Overall Cognitive Status: Within Functional Limits for tasks assessed Arousal/Alertness: Awake/alert Orientation Level: Person;Place;Situation Person: Oriented Place: Oriented Situation: Oriented Year: 2016 Month: August Day of Week: Correct Memory: Appears intact Immediate Memory Recall: Sock;Blue;Bed Memory Recall: Sock;Blue;Bed Memory Recall Sock: Without Cue Memory Recall Blue: Without Cue Memory Recall Bed: Without Cue Awareness: Appears intact Problem Solving: Appears intact Safety/Judgment: Appears intact Sensation Sensation Light Touch Impaired Details: Impaired RUE;Impaired LUE;Impaired RLE;Impaired LLE (able to distinguish LT in BLE but reports tingling) Proprioception: Impaired by gross assessment (inconsistently able to identify ) Coordination Gross Motor Movements are Fluid and Coordinated: No Motor  Motor Motor: Paraplegia;Abnormal postural alignment and control  Trunk/Postural Assessment  Cervical Assessment Cervical Assessment: Exceptions to [facility] (hard collar at all times) Thoracic Assessment Thoracic Assessment: Exceptions to [facility] (flexed posture due to impaired trunk control) Lumbar Assessment Lumbar Assessment: Exceptions to [facility] (sit with posterior tilt; decreased trunk control) Postural Control Postural Control: Deficits on evaluation Trunk Control: decreased due to SCI Protective Responses: delayed and inadequate  Balance Balance Balance Assessed: Yes Static Sitting Balance Static Sitting - Level of Assistance: 1: +1 Total assist;3: Mod assist;2: Max assist Dynamic Sitting Balance Dynamic Sitting - Level of Assistance: 1: +2 Total assist Static Standing Balance Static Standing - Level of Assistance: Not tested  (comment) Dynamic Standing Balance Dynamic Standing - Level of Assistance: Not tested (comment) Extremity/Trunk Assessment RUE Assessment RUE Assessment: Exceptions to [facility] RUE AROM (degrees) Overall AROM Right Upper Extremity: Deficits  RUE Overall AROM Comments: Able to achieve full ROM in R UE, unable to complete finger opposition RUE Strength RUE Overall Strength: Deficits RUE Overall Strength Comments: 4+/5 shoulder, 4+/5 elbow, 3/5 wrist LUE Assessment LUE Assessment: Exceptions to WFL LUE AROM (degrees) Overall AROM Left Upper Extremity: Deficits LUE Overall AROM Comments: Able to achieve full ROM in shoulder, elbow and wrist, unable to complete finger opposition LUE Strength LUE Overall Strength: Deficits LUE Overall Strength Comments: 4+/5 shoulder, 4+/5 elbow, 3/5 wrist  Function:   Eating Eating               Grooming Oral Care,Brush Teeth, Clean Dentures Activity:             Wash, Rinse, Dry Face Activity   Assist Level: Set up   Set up :  (To obtain items)  Wash, Rinse, Dry Hands Activity          Brush, Comb Hair Activity        Shave Activity          Apply Makeup Activity Apply makeup activity did not occur: Patient does not wear makeup                                                           Bathing Bathing position   Position: Bed  Bathing parts Body parts bathed by patient: Right arm;Left arm;Chest;Abdomen Body parts bathed by helper: Front perineal area;Buttocks;Right upper leg;Left upper leg;Right lower leg;Left lower leg;Back  Bathing assist Assist Level: 2 helpers       Upper Body Dressing/Undressing Upper body dressing   What is the patient wearing?: Pull over shirt/dress       Pull over shirt/dress - Perfomed by helper: Thread/unthread right sleeve;Put head through opening;Pull shirt over trunk;Thread/unthread left sleeve        Upper body assist Assist Level: 2 helpers       Lower Body Dressing/Undressing Lower  body dressing   What is the patient wearing?: Pants;Socks       Pants- Performed by helper: Thread/unthread right pants leg;Thread/unthread left pants leg;Fasten/unfasten pants   Non-skid slipper socks- Performed by helper: Don/doff right sock;Don/doff left sock                  Lower body assist Assist Level: 2 Corporate treasurer activity did not occur: No continent bowel/bladder event        Toileting assist     Bed Mobility Roll left and right activity   Assist level: 2 helpers  Sit to lying activity   Assist level: 2 helpers  Lying to sitting activity   Assist level: 2 helpers  Mobility details     Transfers Sit to stand transfer Sit to stand activity did not occur: Safety/medical concerns      Chair/bed transfer   Chair/bed transfer method: Lateral scoot Chair/bed transfer assist level: 2 helpers Chair/bed transfer assistive deviceBanker transfer activity did not occur: Safety/medical concerns              Tub/shower transfer Tub/shower transfer activity did not occur: Safety/medical concerns           Cognition Comprehension Comprehension assist level: Follows basic conversation/direction with no assist  Expression Expression assist level: Expresses basic needs/ideas: With extra time/assistive device  Social Interaction Social Interaction assist level: Interacts appropriately with others with medication or extra time (anti-anxiety, antidepressant).  Problem Solving Problem solving assist level: Solves basic 75 - 89% of the time/requires cueing 10 - 24% of the time  Memory Memory assist level: Complete Independence: No helper    Refer to Care Plan for Long Term Goals  Recommendations for other services: Neuropsych  Discharge Criteria: Patient will be discharged from OT if patient refuses treatment 3 consecutive times without medical reason, if treatment goals not met, if there is  a change in medical status, if patient makes no progress towards goals or if patient is discharged from hospital.  The above assessment, treatment plan, treatment alternatives and goals were discussed and mutually agreed upon: by patient  Ernestina Patches 11/21/2014, 3:38 PM

## 2014-11-21 NOTE — Progress Notes (Signed)
Physical Therapy Session Note  Patient Details  Name: Carl [name] MRN: [id number] Date of Birth: May 23, 1950  Today's Date: 11/21/2014 PT Individual Time: 1300-1400 PT Individual Time Calculation (min): 60 min   Short Term Goals: Week 1:     Skilled Therapeutic Interventions/Progress Updates:    Patient in bed, rolled L and R max assist increased time and cues, pt able to manage upper body after guiding hand to rail, assist with donning brief and pulling up pants.  Patient side to sit +2 total assist, then transfer to wheelchair +2 total assist with slide board. Patient with BP 95/47 then tilted back and up to 99/53 (so deferred standing frame activities and informed RN need for abdominal binder.)  Patient performed seated therex with pulleys using loops an one plate for shoulder depression and triceps extension (with assist on left and scapular stabilization,) x 2 sets of 10, then scapular retraction with lat pull down loop assist for left scapular stability x 10 reps.  Patient pushed in chair back to room and left in tilted position with pillow at head and soft touch call bell in reach.  Therapy Documentation Precautions:  Precautions Precautions: Cervical, Fall Precaution Comments: Using BLE ACE wraps for BP control at this time. Monitor this Required Braces or Orthoses: Cervical Brace Cervical Brace: Hard collar, At all times Restrictions Weight Bearing Restrictions: No Vital Signs: Therapy Vitals Temp: 97.8 F (36.6 C) Temp Source: Oral Pulse Rate: (!) 43 Resp: 18 BP: (!) 95/47 mmHg Patient Position (if appropriate): Sitting Oxygen Therapy SpO2: 98 % O2 Device: Nasal Cannula O2 Flow Rate (L/min): 2 L/min Pain: Pain Assessment Pain Assessment: Faces Pain Score: 2  Faces Pain Scale: Hurts little more Pain Type: Surgical pain Pain Location: Shoulder Pain Orientation: Left Pain Onset: With Activity Pain Intervention(s): Splinting;Rest   Function:  Bed Mobility Roll  left and right activity   Assist level: Max assist (Pt 25 - 49%) (with rail)  Sit to lying activity   Assist level: 2 helpers  Lying to sitting activity   Assist level: 2 helpers  Mobility details     Transfers Sit to stand transfer        Chair/bed transfer   Chair/bed transfer method: Lateral scoot Chair/bed transfer assist level: 2 helpers Chair/bed transfer assistive device: Manufacturing engineer transfer            Cognition Comprehension Comprehension assist level: Follows basic conversation/direction with no assist  Expression Expression assist level: Expresses basic needs/ideas: With extra time/assistive device  Social Interaction Social Interaction assist level: Interacts appropriately with others with medication or extra time (anti-anxiety, antidepressant).  Problem Solving Problem solving assist level: Solves basic 75 - 89% of the time/requires cueing 10 - 24% of the time  Memory Memory assist level: Complete Independence: No helper    Therapy/Group: Individual Therapy  Rudi Coco [place], [place] [phone] 11/21/2014  11/21/2014, 3:22 PM

## 2014-11-21 NOTE — Progress Notes (Addendum)
[facility] PHYSICAL MEDICINE & REHABILITATION     PROGRESS NOTE    Subjective/Complaints: Had a fairl uneventful night. Denies new pain. Was able to sleep a bit. Low grade temp still   ROS: Pt denies  , rash/itching, headache, blurred or double vision, nausea, vomiting, abdominal pain, diarrhea, chest pain, shortness of breath, palpitations, dysuria, dizziness,  back pain, bleeding, anxiety, or depression   Objective: Vital Signs: Blood pressure 107/52, pulse 48, temperature 101.4 F (38.6 C), temperature source Oral, resp. rate 18, height  (1.727 m), SpO2 97 %. Dg Chest Port 1 View  11/19/2014   CLINICAL DATA:  Fever, fall, spinal cord injury, quadriparesis  EXAM: PORTABLE CHEST - 1 VIEW  COMPARISON:  11/16/2014  FINDINGS: Lungs are essentially clear. No focal consolidation. No pleural effusion or pneumothorax.  The heart is top-normal in size.  Left arm PICC terminates in the mid SVC.  Cervical spine fixation hardware.  IMPRESSION: No evidence of acute cardiopulmonary disease.  Left arm PICC terminates in the mid SVC.   Electronically Signed   By: Charline Bills M.D.   On: 11/19/2014 18:33   Dg Abd Portable 1v  11/20/2014   CLINICAL DATA:  Abdominal pain, constipation  EXAM: PORTABLE ABDOMEN - 1 VIEW  COMPARISON:  CT abdomen pelvis dated 11/09/2014  FINDINGS: No evidence of bowel obstruction.  Normal colonic stool burden.  Degenerative changes the lumbar spine.  IMPRESSION: Unremarkable abdominal radiograph.   Electronically Signed   By: Charline Bills M.D.   On: 11/20/2014 21:47    Recent Labs  11/21/14 0525  WBC 16.7*  HGB 10.3*  HCT 30.1*  PLT 241    Recent Labs  11/21/14 0525  NA 130*  K 5.2*  CL 96*  GLUCOSE 107*  BUN 61*  CREATININE 3.16*  CALCIUM 8.1*   CBG (last 3)  No results for input(s): GLUCAP in the last 72 hours.  Wt Readings from Last 3 Encounters:  11/16/14 90.719 kg (200 lb)    Physical Exam:  Constitutional: He is oriented to person,  place, and time. He appears well-developed and well-nourished.  HENT:  Head: Normocephalic and atraumatic.  Healing abrasion on face/eyelid  Eyes: Conjunctivae are normal. Pupils are equal, round, and reactive to light.  Neck:  C Collar.  Cardiovascular: Normal rate and regular rhythm.  Respiratory: Effort normal. No respiratory distress. He has no wheezes.  GI: Soft. He exhibits distension. Bowel sounds are decreased. There is tenderness.  Genitourinary:  Condom cath with yellow- brown urine.  Musculoskeletal: He exhibits tenderness. He exhibits no edema.  Neurological: He is alert and oriented to person, place, and time. No cranial nerve deficit. Coordination normal.  Patient has normal sensation proximal to C6 dermatome. Patient has reduced sensation to pinprick but is able to identify the area touched At bilateral C6 through sacral levels Patient has decreased tone in bilateral Lower extremities 4/5 bilateral deltoids, biceps, 4- triceps, 4 wrist extensors, trace right HI, 1+ left HI, right HF, KE and ADF/APF are 1/5. 0/5 left hip flexors and knee extensors ankle dorsiflexors and plantar flexors  Skin: Skin is warm and dry.  Cervical incision clean and intact   Assessment/Plan: 1. 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 Physiatrist is providing close team supervision and 24 hour management of active medical problems listed below. Physiatrist and rehab team continue to assess barriers to discharge/monitor patient progress toward functional  and medical goals. FIM:                                 Medical Problem List and Plan: 1. Functional deficits secondary to C6 ASIA C incomplete tetraplegia, spinal cord injury due to fall with C5-C6 fracture and neurogenic bowel and bladder 2. DVT Prophylaxis/Anticoagulation: Pharmaceutical: Lovenox.  3. Pain  Management: Continue Lyrica BID for neuropathic pain. 4. Mood: Team to provide ego support. LCSW to follow for evaluation and support.    -consider antidepressant, neuropsych eval 5. Neuropsych: This patient is capable of making decisions on his own behalf. 6. Skin/Wound Care: Routine pressure relief measures.  7. Fluids/Electrolytes/Nutrition: encourage PO.  I personally reviewed the patient's labs today- 8. ZOX:[id number] to rule out DVT. Encourage IS. Blood cultures if patient spikes again.  -wound appears clean  -recent cxr normal  -ucx pending, wbc's climbing 9. CV: abd binder/compression stockings to maintain sitting/upright bp  -does not require pharmaceutical rx at present 10. Acutre renal failure?---stat recheck of labs---sudden change although last lab was 5 days ago  -begin IVF, 100cc/hr---increase if lab verified.   LOS (Days) 1 A FACE TO FACE EVALUATION WAS PERFORMED  Vear Staton T 11/21/2014 8:04 AM

## 2014-11-22 ENCOUNTER — Inpatient Hospital Stay (HOSPITAL_COMMUNITY): Payer: Federal, State, Local not specified - PPO | Admitting: Rehabilitation

## 2014-11-22 ENCOUNTER — Inpatient Hospital Stay (HOSPITAL_COMMUNITY): Payer: Federal, State, Local not specified - PPO | Admitting: Occupational Therapy

## 2014-11-22 ENCOUNTER — Inpatient Hospital Stay (HOSPITAL_COMMUNITY): Payer: Federal, State, Local not specified - PPO | Admitting: *Deleted

## 2014-11-22 ENCOUNTER — Inpatient Hospital Stay (HOSPITAL_COMMUNITY): Payer: Federal, State, Local not specified - PPO | Admitting: Physical Therapy

## 2014-11-22 DIAGNOSIS — N179 Acute kidney failure, unspecified: Secondary | ICD-10-CM

## 2014-11-22 LAB — URINE CULTURE

## 2014-11-22 LAB — BASIC METABOLIC PANEL
Anion gap: 6 (ref 5–15)
BUN: 37 mg/dL — AB (ref 6–20)
CALCIUM: 8.1 mg/dL — AB (ref 8.9–10.3)
CO2: 27 mmol/L (ref 22–32)
CREATININE: 1.29 mg/dL — AB (ref 0.61–1.24)
Chloride: 99 mmol/L — ABNORMAL LOW (ref 101–111)
GFR calc non Af Amer: 57 mL/min — ABNORMAL LOW (ref >60)
GLUCOSE: 103 mg/dL — AB (ref 65–99)
Potassium: 4.6 mmol/L (ref 3.5–5.1)
Sodium: 132 mmol/L — ABNORMAL LOW (ref 135–145)

## 2014-11-22 MED ORDER — ENOXAPARIN SODIUM 40 MG/0.4ML ~~LOC~~ SOLN
40.0000 mg | SUBCUTANEOUS | Status: DC
  Administered 2014-11-22 – 2014-12-18 (×27): 40 mg via SUBCUTANEOUS
  Filled 2014-11-22 (×26): qty 0.4

## 2014-11-22 NOTE — Plan of Care (Signed)
Problem: SCI BLADDER ELIMINATION Goal: RH STG MANAGE BLADDER WITH ASSISTANCE STG Manage Bladder With Mod Assistance  Outcome: Not Progressing foley

## 2014-11-22 NOTE — Progress Notes (Signed)
Physical Therapy Session Note  Patient Details  Name: Carl [name] MRN: [id number] Date of Birth: 1950/05/08  Today's Date: 11/22/2014 PT Individual Time: 1500-1610 PT Individual Time Calculation (min): 70 min   Short Term Goals: Week 1:  PT Short Term Goal 1 (Week 1): Pt will be able to verbalize need for pressure relief when OOB in w/c every 30 min PT Short Term Goal 2 (Week 1): Pt will be able to roll with max A of 1 using bed rails PRN PT Short Term Goal 3 (Week 1): Pt will be able to tolerate EOB x 10 min with max A  during functional task PT Short Term Goal 4 (Week 1): Pt will be able to tolerate OOB x 2 hours a day  Skilled Therapeutic Interventions/Progress Updates:   Session focused on functional transfers and patient directed care, pressure relief, sitting balance, and standing tolerance. Patient directed setup of wheelchair in preparation for slide board transfer with mod-max cues. Sitting balance edge of mat with patient propped on BUE with min-max A and brief periods of supervision. When cued to reposition hands posteriorly to patient as opposed to next to patient, balance improved. Patient recalled frequency and technique for pressure relief in tilt in space wheelchair with boosting but stated, "There's not anyone around to do it," when discussing pressure relief performed today. Reinforced education for patient to advocate for self and call for assistance for boosting. Timer provided to increase compliance with pressure relief. Patient tolerated standing frame x 7.5 min, see BP below. See below for transfers and bed mobility. Sp02 >94% on room air throughout session. Patient requested to return to bed at end of session, left semi reclined in bed with BUE supported on pillows, soft call bell in lap, and family in room.   Therapy Documentation Precautions:  Precautions Precautions: Cervical, Fall Precaution Comments: Using BLE ACE wraps for BP control at this time. Monitor  this Required Braces or Orthoses: Cervical Brace Cervical Brace: Hard collar, At all times Restrictions Weight Bearing Restrictions: No Vital Signs: Sitting BP = 102/57 Standing x 1 min in standing frame BP = 104/53 Standing x 7 min in standing frame BP 91/60 Pain: Pain Assessment Pain Assessment: Faces Faces Pain Scale: Hurts little more Pain Type: Acute pain Pain Location: Wrist Pain Orientation: Right Pain Descriptors / Indicators: Discomfort Pain Onset: With Activity Pain Intervention(s): Repositioned;Rest   Function:   Bed Mobility Roll left and right activity   Sit to lying activity   Assist level: 2 helpers  Lying to sitting activity      Mobility details Bed mobility details: Verbal cues for techniques;Manual facilitation for weight shifting;Manual facilitation for placement   Transfers Sit to stand transfer   Sit to stand assist level: 2 helpers Sit to stand assistive device: Other (Standing Frame)  Chair/bed transfer   Chair/bed transfer method: Lateral scoot Chair/bed transfer assist level: 2 helpers Chair/bed transfer assistive device: Sliding board;Armrests   Chair/bed transfer details: Verbal cues for precautions/safety;Verbal cues for technique;Verbal cues for sequencing;Manual facilitation for weight shifting;Manual facilitation for placement   Toilet transfer        Car transfer          Locomotion Ambulation Ambulation activity did not occur: Safety/medical concerns        Walk 10 feet activity      Walk 50 feet with 2 turns activity      Walk 150 feet activity      Walk 10 feet on uneven surfaces activity  Stairs          Walk up/down 1 step activity        Walk up/down 4 steps activity      Walk up/down 12 steps activity      Pick up small objects from floor      Wheelchair   Type: Manual   Assist Level: Dependent (Pt equals 0%)  Wheel 50 feet with 2 turns activity   Assist Level: Dependent (Pt equals 0%)  Wheel  150 feet activity   Assist Level: Dependent (Pt equals 0%)   Cognition Comprehension Comprehension assist level: Follows basic conversation/direction with no assist  Expression Expression assist level: Expresses basic needs/ideas: With extra time/assistive device  Social Interaction Social Interaction assist level: Interacts appropriately with others with medication or extra time (anti-anxiety, antidepressant).  Problem Solving Problem solving assist level: Solves basic 75 - 89% of the time/requires cueing 10 - 24% of the time  Memory Memory assist level: Complete Independence: No helper    Therapy/Group: Individual Therapy  Kerney Elbe 11/22/2014, 4:43 PM

## 2014-11-22 NOTE — Progress Notes (Signed)
Physical Therapy Session Note  Patient Details  Name: Carl [name] MRN: [id number] Date of Birth: 03-03-1951  Today's Date: 11/22/2014 PT Individual Time: 1100-1200 PT Individual Time Calculation (min): 60 min   Short Term Goals: Week 1:  PT Short Term Goal 1 (Week 1): Pt will be able to verbalize need for pressure relief when OOB in w/c every 30 min PT Short Term Goal 2 (Week 1): Pt will be able to roll with max A of 1 using bed rails PRN PT Short Term Goal 3 (Week 1): Pt will be able to tolerate EOB x 10 min with max A  during functional task PT Short Term Goal 4 (Week 1): Pt will be able to tolerate OOB x 2 hours a day  Skilled Therapeutic Interventions/Progress Updates:   Pt received lying in bed, agreeable to therapy session.  C/o pain in B UEs but states that he had gotten pain meds prior to session.  Note pt still wearing 2LO2, therefore checked sats and were 97%, therefore, performed bed mobility and sitting EOB at 1LO2 with SaO2 at 95%.  Removed O2 for remainder of session and note that sats remained at or above 92%, RN made aware and was told to leave O2 off.  Skilled session focused on functional bed mobility, slideboard transfers, recall of set up of transfers and sequencing, and sitting balance/postural control. See below for details on bed mobility and transfers.  Requires min to max A for sitting balance due to poor trunk control.  Worked on controlled activation of abdominals and back extensors with forwards and backwards weight shifts with BUEs on physioball.  Requires max fading to min/mod A for task with cues for finding midline and holding.  Tends to lose balance to the R more so than L.  Assisted back to TIS w/c and to room.  Left with all needs in reach and wife present in room (who was present during session to observe).    Therapy Documentation Precautions:  Precautions Precautions: Cervical, Fall Precaution Comments: Using BLE ACE wraps for BP control at this time.  Monitor this Required Braces or Orthoses: Cervical Brace Cervical Brace: Hard collar, At all times Restrictions Weight Bearing Restrictions: No  Pain: Pain Assessment Pain Assessment: No/denies pain Pain Score: 3    Function:  Toileting Toileting       Bed Mobility Roll left and right activity   Assist level: 2 helpers  Sit to lying activity   Assist level: 2 helpers  Lying to sitting activity      Mobility details Bed mobility details: Tactile cues for placement;Verbal cues for techniques;Verbal cues for safe use of DME/AE;Verbal cues for precautions/safety;Manual facilitation for weight shifting;Manual facilitation for placement;Verbal cues for sequencing   Transfers Sit to stand transfer        Chair/bed transfer   Chair/bed transfer method: Lateral scoot Chair/bed transfer assist level: 2 helpers Chair/bed transfer assistive device: Sliding board;Orthosis;Armrests   Chair/bed transfer details: Visual cues for safe use of DME/AE;Verbal cues for technique;Verbal cues for precautions/safety;Verbal cues for safe use of DME/AE;Manual facilitation for weight shifting;Manual facilitation for weight bearing;Manual facilitation for placement   Toilet transfer        Car transfer          Locomotion Ambulation          Walk 10 feet activity      Walk 50 feet with 2 turns activity      Walk 150 feet activity  Walk 10 feet on uneven surfaces activity      Stairs          Walk up/down 1 step activity        Walk up/down 4 steps activity      Walk up/down 12 steps activity      Pick up small objects from floor      Wheelchair   Type: Manual   Assist Level: Dependent (Pt equals 0%)  Wheel 50 feet with 2 turns activity   Assist Level: Dependent (Pt equals 0%)  Wheel 150 feet activity   Assist Level: Dependent (Pt equals 0%)   Cognition Comprehension Comprehension assist level: Follows basic conversation/direction with no assist  Expression  Expression assist level: Expresses basic needs/ideas: With extra time/assistive device  Social Interaction Social Interaction assist level: Interacts appropriately with others with medication or extra time (anti-anxiety, antidepressant).  Problem Solving Problem solving assist level: Solves basic 75 - 89% of the time/requires cueing 10 - 24% of the time  Memory Memory assist level: Complete Independence: No helper    Therapy/Group: Individual Therapy  Vista Deck 11/22/2014, 12:11 PM

## 2014-11-22 NOTE — Plan of Care (Signed)
Problem: SCI BOWEL ELIMINATION Goal: RH STG SCI MANAGE BOWEL WITH MEDICATION WITH ASSISTANCE STG SCI Manage bowel with medication with assistance.  Outcome: Not Progressing No BM

## 2014-11-22 NOTE — Evaluation (Signed)
Recreational Therapy Assessment and Plan  Patient Details  Name: Carl [name] MRN: [id number] Date of Birth: 22-Jan-1951 Today's Date: 11/22/2014  Rehab Potential: Good ELOS: 4 weeks   Assessment Clinical Impression:  Problem List:  Patient Active Problem List   Diagnosis Date Noted  . C6 spinal cord injury 11/20/2014  . Neurogenic bowel 11/20/2014  . Neurogenic bladder 11/20/2014  . Fever of unknown origin   . Quadriplegia 11/14/2014  . C5 vertebral fracture 11/09/2014    Past Medical History:  Past Medical History  Diagnosis Date  . Bradycardia   . Chronic abdominal pain   . Hesitancy of micturition    Past Surgical History:  Past Surgical History  Procedure Laterality Date  . Anterior cervical decomp/discectomy fusion N/A 11/14/2014    Procedure: Cervical Five-Cervical Six Anterior Cervical Decompression/Diskectomy/Fusion; Surgeon: Erline Levine, MD; Location: [facility]; Service: Neurosurgery; Laterality: N/A;    Assessment & Plan Clinical Impression: Patient is a 64 y.o. year old male with recent admission to the hospital male who fell from the roof while painting hsi house with complaints of neck pain and inability to move BLE wiand weakness BUE. Patient hypotensive at admission requiring dopamine and GCS 15. Bedside ultrasound negative and patient with poor air movement with poor respiratory effort. Work up revealed comminuted left C5 and C6 fractures with prevertebral and paraspinal edema, extensive cervical strain and cervical cord contusion with swelling extending C5-C6 with intramedullary hematoma, nondisplaced right 9th and 10th ribs and right eyelid laceration. Patient with numbness BLE as well as numbness/pain in chest and abdominal wall. Right brow lacerations repaired by Dr. Prudencio Burly and no signs of globe injury noted. He was placed on bedrest with Aspen collar in place and underwent ACDF with PEEk cage by Dr.  Vertell Limber on 08/09. He has had bradycardia with hypotension due to neurogenic shock and was started on steriods with improvement in symptoms. Had fever 102.9 last pm and CXR negative for infection. Urine culture ordered and pending. Therapy ongoing and patient showing improvement in tolerance of activity at EOB. CIR recommended by MD and rehab team and patient . Patient transferred to CIR on 11/20/2014 .         Pt presents with decreased activity tolerance, decreased oxygen support, decreased functional mobility, decreased balance, decreased coordination Limiting pt's independence with leisure/community pursuits.  Leisure History/Participation Premorbid leisure interest/current participation: [employer];Community - Travel (Comment);Crafts - Woodworking;Joretta Bachelor care;Petra K[employer] - [employer] Other Leisure Interests: Television;Reading Leisure Participation Style: Alone;With Family/Friends Awareness of Community Resources: Excellent Psychosocial / Spiritual Spiritual Interests: Church;[employer] (pastor) Patient agreeable to Pet Therapy: Yes Does patient have pets?: Yes Social interaction - Mood/Behavior: Cooperative Engineer, drilling for Education?: Yes Recreational Therapy Orientation Orientation -Reviewed with patient: Available activity resources Strengths/Weaknesses Patient Strengths/Abilities: Willingness to participate;Active premorbidly Patient weaknesses: Physical limitations TR Patient demonstrates impairments in the following area(s): Edema;Endurance;Motor;Pain;Safety;Skin Integrity  Plan Rec Therapy Plan Is patient appropriate for Therapeutic Recreation?: Yes Rehab Potential: Good Treatment times per week: Min 1 time per week >20 minutes Estimated Length of Stay: 4 weeks TR Treatment/Interventions: Adaptive equipment instruction;1:1 session;Balance/vestibular training;Functional mobility training;Community  reintegration;Patient/family education;Therapeutic activities;Recreation/leisure participation;Therapeutic exercise;UE/LE Coordination activities;Wheelchair propulsion/positioning  Recommendations for other services: None  Discharge Criteria: Patient will be discharged from TR if patient refuses treatment 3 consecutive times without medical reason.  If treatment goals not met, if there is a change in medical status, if patient makes no progress towards goals or if patient is discharged from  hospital.  The above assessment, treatment plan, treatment alternatives and goals were discussed and mutually agreed upon: by patient  Cleary 11/22/2014, 11:20 AM

## 2014-11-22 NOTE — Progress Notes (Addendum)
Occupational Therapy Session Note  Patient Details  Name: Carl [name] MRN: [id number] Date of Birth: 1950-04-30  Today's Date: 11/22/2014 OT Individual Time: 0830-0930 and 1400-1500 OT Individual Time Calculation (min): 60 min and 60 min   Short Term Goals: Week 1:  OT Short Term Goal 1 (Week 1): Pt will bathe in supine with mod A OT Short Term Goal 2 (Week 1): Pt will direct care for bed mobility with min questioning cues OT Short Term Goal 3 (Week 1): Pt will self feed 50% of meal with min A  Skilled Therapeutic Interventions/Progress Updates:    Session One: Pt seen for OT ADL bathing and dressing session. Pt in supine upon arrival, agreeable to tx session. See below for and level of assist. Pt provided with LH sponge and Education provided regarding use of LB bathing. Pt demonstrated decreased ability to have functional grasp of sponge. Washclothes taped around handle for built up grip to ease manipulation. He demonstrated ability to have gross grasp to hold deodorant container.Pt remained with eye clothes throughout majority of session, stating it was due to fatigue following eating breakfast.  +2 assist required for log rolling and tactile and VCs for sequencing/ technique of bed mobility. Pt able to grasp bed rail to assist with bed mobility.  Pt and caregiver educated throughout session regarding positioning, purpose of TED hose/ ACE wraps, and d/[name] planning.   Session Two: Pt seen for OT therapy session focusing on functional sitting balance, functional activity tolerance, and fine motor coordination. Pt in supine upon arrival, voicing fatigue, however agreeable to tx session.BP remained stable throughout session with ACE wraps on. 98/53 in supine and 105/89 sitting in w/[name].  He required +2 assist for all mobility and transfers. In therapy gym, he sat on EOM with posterior support required for static sitting balance. Pt completed cone,cup, and golf all grasping task, requiring increased  time and trials to grasp objects with decreased control of release due to decreased functional grip strength. Pt educated regarding use of tenodesis grasp for functional use and displayed ability to complete in functional manner. Pt left in w/[name] at end of session with maxi-move pad placed for nursing to assist for return to bed when pt ready. Educated pt on importance of boosting schedule and pressure relief. He did not recall information taught in yesterday's session regarding boosting schedule. Boosting schedule sheet placed in pt's room, RN made aware. Pt provided with timer for monitoring time of pressure relief needs.    Therapy Documentation Precautions:  Precautions Precautions: Cervical, Fall Precaution Comments: Using BLE ACE wraps for BP control at this time. Monitor this Required Braces or Orthoses: Cervical Brace Cervical Brace: Hard collar, At all times Restrictions Weight Bearing Restrictions: No Pain: Pain Assessment Pain Assessment: No/denies pain  Function:   Grooming Oral Care,Brush Teeth, Clean Dentures Activity:             Wash, Rinse, Dry Face Activity   Assist Level: Set up   Set up : To obtain items;To open containers  Wash, Rinse, Dry Hands Activity          Brush, Comb Hair Activity        Shave Activity          Apply Makeup Activity  Bathing Bathing position   Position: Bed  Bathing parts Body parts bathed by patient: Right arm;Left arm;Chest;Abdomen;Right upper leg;Left upper leg Body parts bathed by helper: Front perineal area;Buttocks;Right lower leg;Left lower leg;Back  Bathing assist Assist Level: 2 helpers;Assistive device Assistive Device Comment: LH sponge     Upper Body Dressing/Undressing Upper body dressing   What is the patient wearing?: Pull over shirt/dress       Pull over shirt/dress - Perfomed by helper: Thread/unthread right sleeve;Put head through  opening;Pull shirt over trunk;Thread/unthread left sleeve        Upper body assist Assist Level: 2 helpers       Lower Body Dressing/Undressing Lower body dressing   What is the patient wearing?: Pants;Socks       Pants- Performed by helper: Thread/unthread right pants leg;Thread/unthread left pants leg;Fasten/unfasten pants   Non-skid slipper socks- Performed by helper: Don/doff right sock;Don/doff left sock                  Lower body assist Assist Level: 2 Helpers         Bed Mobility Roll left and right activity   Assist level: 2 helpers  Sit to lying activity   Assist level: 2 helpers  Lying to sitting activity  Assist level: 2 helpers    Mobility details          Therapy/Group: Individual Therapy  Carl [name] 11/22/2014, 11:41 AM

## 2014-11-22 NOTE — Progress Notes (Signed)
[place] PHYSICAL MEDICINE & REHABILITATION     PROGRESS NOTE    Subjective/Complaints: A little tired from therapies. Had a reasonable night. Denies pain.  ROS: Pt denies  , rash/itching, headache, blurred or double vision, nausea, vomiting, abdominal pain, diarrhea, chest pain, shortness of breath, palpitations, dysuria, dizziness,  back pain, bleeding, anxiety, or depression   Objective: Vital Signs: Blood pressure 108/53, pulse 48, temperature 98.7 F (37.1 C), temperature source Oral, resp. rate 18, height  (1.727 m), SpO2 100 %. Dg Abd Portable 1v  11/20/2014   CLINICAL DATA:  Abdominal pain, constipation  EXAM: PORTABLE ABDOMEN - 1 VIEW  COMPARISON:  CT abdomen pelvis dated 11/09/2014  FINDINGS: No evidence of bowel obstruction.  Normal colonic stool burden.  Degenerative changes the lumbar spine.  IMPRESSION: Unremarkable abdominal radiograph.   Electronically Signed   By: Charline Bills M.D.   On: 11/20/2014 21:47    Recent Labs  11/21/14 0525  WBC 16.7*  HGB 10.3*  HCT 30.1*  PLT 241    Recent Labs  11/21/14 1225 11/22/14 0525  NA 133* 132*  K 5.2* 4.6  CL 97* 99*  GLUCOSE 118* 103*  BUN 67* 37*  CREATININE 2.99* 1.29*  CALCIUM 8.4* 8.1*   CBG (last 3)  No results for input(s): GLUCAP in the last 72 hours.  Wt Readings from Last 3 Encounters:  11/16/14 90.719 kg (200 lb)    Physical Exam:  Constitutional: He is oriented to person, place, and time. He appears well-developed and well-nourished.  HENT:  Head: Normocephalic and atraumatic.  Healing abrasion on face/eyelid  Eyes: Conjunctivae are normal. Pupils are equal, round, and reactive to light.  Neck:  C Collar.  Cardiovascular: Normal rate and regular rhythm.  Respiratory: Effort normal. No respiratory distress. He has no wheezes.  GI: Soft. He exhibits distension. Bowel sounds are decreased. There is tenderness.  Genitourinary:  Foley with urine  Musculoskeletal: He exhibits  tenderness. He exhibits no edema.  Neurological: He is alert and oriented to person, place, and time. No cranial nerve deficit. Coordination normal.  Patient has normal sensation proximal to C6 dermatome. Patient has reduced sensation to pinprick but is able to identify the area touched At bilateral C6 through sacral levels Patient has decreased tone in bilateral Lower extremities 4/5 bilateral deltoids, biceps, 4- triceps, 4 wrist extensors, trace right HI, 1+ left HI, right HF, KE and ADF/APF are 1/5. 0/5 left hip flexors and knee extensors ankle dorsiflexors and plantar flexors  Skin: Skin is warm and dry.  Cervical incision clean and intact   Assessment/Plan: 1. 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 Physiatrist is providing close team supervision and 24 hour management of active medical problems listed below. Physiatrist and rehab team continue to assess barriers to discharge/monitor patient progress toward functional and medical goals. FIM: Function - Bathing Position: Bed Body parts bathed by patient: Right arm, Left arm, Chest, Abdomen Body parts bathed by helper: Front perineal area, Buttocks, Right upper leg, Left upper leg, Right lower leg, Left lower leg, Back Assist Level: 2 helpers  Function- Upper Body Dressing/Undressing What is the patient wearing?: Pull over shirt/dress Pull over shirt/dress - Perfomed by helper: Thread/unthread right sleeve, Put head through opening, Pull shirt over trunk, Thread/unthread left sleeve Assist Level: 2 helpers Function - Lower Body Dressing/Undressing What is the patient wearing?: Pants, Socks Position: Bed Pants- Performed by  helper: Thread/unthread right pants leg, Thread/unthread left pants leg, Fasten/unfasten pants Non-skid slipper socks- Performed by helper: Don/doff right sock, Don/doff left sock Assist Level: 2  Helpers  Function - Toileting Toileting activity did not occur: No continent bowel/bladder event  Function - Archivist transfer activity did not occur: Safety/medical concerns  Function - Chair/bed transfer Chair/bed transfer method: Lateral scoot Chair/bed transfer assist level: 2 helpers Chair/bed transfer assistive device: Sliding board, Orthosis, Armrests Chair/bed transfer details: Visual cues for safe use of DME/AE, Verbal cues for technique, Verbal cues for precautions/safety, Verbal cues for safe use of DME/AE, Manual facilitation for weight shifting, Manual facilitation for weight bearing  Function - Locomotion: Wheelchair Will patient use wheelchair at discharge?: Yes Type: Manual Assist Level: Dependent (Pt equals 0%) (tilt in space w/c) Assist Level: Dependent (Pt equals 0%) Assist Level: Dependent (Pt equals 0%) Function - Locomotion: Ambulation Ambulation activity did not occur: Safety/medical concerns (incomplete quadriplegia) Walk 10 feet activity did not occur: Safety/medical concerns Walk 50 feet with 2 turns activity did not occur: Safety/medical concerns Walk 150 feet activity did not occur: Safety/medical concerns Walk 10 feet on uneven surfaces activity did not occur: Safety/medical concerns  Function - Comprehension Comprehension: Auditory Comprehension assist level: Follows basic conversation/direction with no assist  Function - Expression Expression: Verbal Expression assist level: Expresses basic needs/ideas: With extra time/assistive device  Function - Social Interaction Social Interaction assist level: Interacts appropriately with others with medication or extra time (anti-anxiety, antidepressant).  Function - Problem Solving Problem solving assist level: Solves basic 75 - 89% of the time/requires cueing 10 - 24% of the time  Function - Memory Memory assist level: Complete Independence: No helper Patient normally able to recall  (first 3 days only): Location of own room, Staff names and faces, That he or she is in a hospital Medical Problem List and Plan: 1. Functional deficits secondary to C6 ASIA C incomplete tetraplegia, spinal cord injury due to fall with C5-C6 fracture and neurogenic bowel and bladder 2. DVT Prophylaxis/Anticoagulation: Pharmaceutical: Lovenox.  3. Pain Management: Continue Lyrica BID for neuropathic pain. 4. Mood: Team to provide ego support.  .    -consider antidepressant, neuropsych eval requested 5. Neuropsych: This patient is capable of making decisions on his own behalf. 6. Skin/Wound Care: Routine pressure relief measures.  7. Fluids/Electrolytes/Nutrition: encourage PO.  I personally reviewed the patient's labs today- 8. ZOX:WRUEAVWU to rule out DVT. Encourage IS. Blood cultures if patient spikes again.  -wound appears clean  -recent cxr normal  -ucx pending, wbc's climbing---recheck tomorrow  -afebrile over last 24 hours 9. CV: abd binder/compression stockings to maintain sitting/upright bp  -does not require pharmaceutical rx at present 10. Acutre renal failure---volume depletion---responded to ivf   -decrease IVF to 75cc/hr.   LOS (Days) 2 A FACE TO FACE EVALUATION WAS PERFORMED  Lafayette Dunlevy T 11/22/2014 7:49 AM

## 2014-11-23 ENCOUNTER — Encounter (HOSPITAL_COMMUNITY): Payer: Federal, State, Local not specified - PPO

## 2014-11-23 ENCOUNTER — Inpatient Hospital Stay (HOSPITAL_COMMUNITY): Payer: Federal, State, Local not specified - PPO | Admitting: Occupational Therapy

## 2014-11-23 ENCOUNTER — Inpatient Hospital Stay (HOSPITAL_COMMUNITY): Payer: Federal, State, Local not specified - PPO

## 2014-11-23 DIAGNOSIS — A499 Bacterial infection, unspecified: Secondary | ICD-10-CM

## 2014-11-23 DIAGNOSIS — N390 Urinary tract infection, site not specified: Secondary | ICD-10-CM

## 2014-11-23 LAB — CBC
HCT: 31.5 % — ABNORMAL LOW (ref 39.0–52.0)
Hemoglobin: 10.7 g/dL — ABNORMAL LOW (ref 13.0–17.0)
MCH: 30.4 pg (ref 26.0–34.0)
MCHC: 34 g/dL (ref 30.0–36.0)
MCV: 89.5 fL (ref 78.0–100.0)
PLATELETS: 319 10*3/uL (ref 150–400)
RBC: 3.52 MIL/uL — AB (ref 4.22–5.81)
RDW: 12.9 % (ref 11.5–15.5)
WBC: 15 10*3/uL — ABNORMAL HIGH (ref 4.0–10.5)

## 2014-11-23 LAB — BASIC METABOLIC PANEL
Anion gap: 7 (ref 5–15)
BUN: 24 mg/dL — AB (ref 6–20)
CALCIUM: 8.4 mg/dL — AB (ref 8.9–10.3)
CO2: 28 mmol/L (ref 22–32)
CREATININE: 0.87 mg/dL (ref 0.61–1.24)
Chloride: 101 mmol/L (ref 101–111)
GFR calc Af Amer: >60 mL/min (ref >60)
GLUCOSE: 119 mg/dL — AB (ref 65–99)
Potassium: 4.5 mmol/L (ref 3.5–5.1)
SODIUM: 136 mmol/L (ref 135–145)

## 2014-11-23 MED ORDER — SULFAMETHOXAZOLE-TRIMETHOPRIM 400-80 MG PO TABS
1.0000 | ORAL_TABLET | Freq: Two times a day (BID) | ORAL | Status: AC
Start: 2014-11-23 — End: 2014-11-29
  Administered 2014-11-23 – 2014-11-29 (×14): 1 via ORAL
  Filled 2014-11-23 (×16): qty 1

## 2014-11-23 NOTE — Progress Notes (Signed)
Occupational Therapy Session Note  Patient Details  Name: Carl [name] MRN: [id number] Date of Birth: 11-19-1950  Today's Date: 11/23/2014 OT Individual Time: 1100-1200 OT Individual Time Calculation (min): 60 min    Short Term Goals: Week 1:  OT Short Term Goal 1 (Week 1): Pt will bathe in supine with mod A OT Short Term Goal 2 (Week 1): Pt will direct care for bed mobility with min questioning cues OT Short Term Goal 3 (Week 1): Pt will self feed 50% of meal with min A  Skilled Therapeutic Interventions/Progress Updates:    Pt resting in w/c upon arrival with wife present.  Pt initially engaged in BUE therex with emphasis on open chain movements at shoulder and elbow.  Pt completed unilateral therex with 1# weights on wrists.  Pt also performed biceps curls with 4# weight bar.  Pt transitioned to self feeding activities.  Pt was able to scoop food and bring to mouth using built up spoon.  Pt also used built up fork to stab pieces of pie.  Pt used bilateral hands to appropriately reposition spoon to increase success with scooping/stabbing food and bringing to mouth.  Focus on increased BUE use to increase independence with self feeding and BADLs.  Therapy Documentation Precautions:  Precautions Precautions: Cervical, Fall Precaution Comments: Using BLE ACE wraps for BP control at this time. Monitor this Required Braces or Orthoses: Cervical Brace Cervical Brace: Hard collar, At all times Restrictions Weight Bearing Restrictions: No Pain: Pain Assessment Pain Assessment: 0-10 Pain Score: 4  Pain Type: Acute pain Pain Location: Shoulder Pain Orientation: Right;Left Pain Descriptors / Indicators: Discomfort;Sore Pain Onset: On-going Pain Intervention(s): RN made aware;Repositioned Other Treatments:    Function:   Eating Eating   Eating Assist Level: Help managing cup/glass   Eating Set Up Assist For: Applying device (includes dentures);Opening containers                                                                 Therapy/Group: Individual Therapy  Rich Brave 11/23/2014, 12:08 PM

## 2014-11-23 NOTE — Progress Notes (Signed)
Physical Therapy Session Note  Patient Details  Name: Carl [name] MRN: [id number] Date of Birth: Sep 25, 1950  Today's Date: 11/23/2014 PT Individual Time: 1000-1100 PT Individual Time Calculation (min): 60 min   Short Term Goals: Week 1:  PT Short Term Goal 1 (Week 1): Pt will be able to verbalize need for pressure relief when OOB in w/c every 30 min PT Short Term Goal 2 (Week 1): Pt will be able to roll with max A of 1 using bed rails PRN PT Short Term Goal 3 (Week 1): Pt will be able to tolerate EOB x 10 min with max A  during functional task PT Short Term Goal 4 (Week 1): Pt will be able to tolerate OOB x 2 hours a day  Skilled Therapeutic Interventions/Progress Updates:   Session focused on addressing functional transfers with slideboard with emphasis on pt directing steps for transfer with S level cues and focus on technique, neuro re-ed seated edge of mat with focus on static and dynamic sitting balance, postural control, upright posture, and functional use of BUE for reaching task (rings and then tossing to target), and education and review of boosting technique with pt's wife in tilt in space w/c. Pt able to maintain static balance with min A at times with cues for hand placement and weightshifting and dynamic sitting balance ranged from min to total A due to decreased control with anterior weightshift. Pt demonstrating good progress with balance though requires increased assist as fatigues. Rest breaks as needed. [name] able to return demonstrate boosting technique successfully and pt able to recall recommendation for every 30 min x 2 min for boosting. Pt has timer in room as well to aid with this.   Vitals with BLE ACE wraps donned: BP up in w/c= 99/50 mmHg; HR = 43 bpm BP EOM = 96/56 mmHg; HR = 44 bpm; O2 = 94% on room air  Therapy Documentation Precautions:  Precautions Precautions: Cervical, Fall Precaution Comments: Using BLE ACE wraps for BP control at this time. Monitor  this Required Braces or Orthoses: Cervical Brace Cervical Brace: Hard collar, At all times Restrictions Weight Bearing Restrictions: No  Pain: Reports some discomfort in shoulders - premedicated.   Function:  Toileting Toileting       Bed Mobility Roll left and right activity   Assist level: 2 helpers  Sit to lying activity   Assist level: 2 helpers  Lying to sitting activity   Assist level: 2 helpers  Mobility details     Transfers Sit to stand transfer        Chair/bed transfer   Chair/bed transfer method: Lateral scoot (slideboard) Chair/bed transfer assist level: 2 helpers Chair/bed transfer assistive device: Sliding board;Armrests;Orthosis   Chair/bed transfer details: Verbal cues for precautions/safety;Verbal cues for technique;Verbal cues for sequencing;Manual facilitation for weight shifting;Manual facilitation for placement   Toilet transfer        Car transfer          Locomotion Ambulation          Walk 10 feet activity      Walk 50 feet with 2 turns activity      Walk 150 feet activity      Walk 10 feet on uneven surfaces activity      Stairs          Walk up/down 1 step activity        Walk up/down 4 steps activity      Walk up/down 12 steps activity  Pick up small objects from floor      Wheelchair   Type: Manual   Assist Level: Dependent (Pt equals 0%) (Tilt in space w/c)  Wheel 50 feet with 2 turns activity   Assist Level: Dependent (Pt equals 0%)  Wheel 150 feet activity   Assist Level: Dependent (Pt equals 0%)   Cognition Comprehension Comprehension assist level: Follows basic conversation/direction with no assist  Expression    Social Interaction Social Interaction assist level: Interacts appropriately with others with medication or extra time (anti-anxiety, antidepressant).  Problem Solving Problem solving assist level: Solves basic problems with no assist  Memory Memory assist level: Complete Independence: No  helper    Therapy/Group: Individual Therapy  Karolee Stamps Darrol Poke, PT, DPT  11/23/2014, 12:13 PM

## 2014-11-23 NOTE — Progress Notes (Signed)
[place] PHYSICAL MEDICINE & REHABILITATION     PROGRESS NOTE    Subjective/Complaints: No new complaints. Slept ok. Offers little really. States pain is controlled.  ROS: Pt denies  , rash/itching, headache, blurred or double vision, nausea, vomiting, abdominal pain, diarrhea, chest pain, shortness of breath, palpitations, dysuria, dizziness,  back pain, bleeding, anxiety, or depression   Objective: Vital Signs: Blood pressure 108/56, pulse 42, temperature 97.6 F (36.4 C), temperature source Oral, resp. rate 18, height 5\' 8"  (1.727 m), weight 87.59 kg (193 lb 1.6 oz), SpO2 94 %. No results found.  Recent Labs  11/21/14 0525  WBC 16.7*  HGB 10.3*  HCT 30.1*  PLT 241    Recent Labs  11/21/14 1225 11/22/14 0525  NA 133* 132*  K 5.2* 4.6  CL 97* 99*  GLUCOSE 118* 103*  BUN 67* 37*  CREATININE 2.99* 1.29*  CALCIUM 8.4* 8.1*   CBG (last 3)  No results for input(s): GLUCAP in the last 72 hours.  Wt Readings from Last 3 Encounters:  11/22/14 87.59 kg (193 lb 1.6 oz)  11/16/14 90.719 kg (200 lb)    Physical Exam:  Constitutional: He is oriented to person, place, and time. He appears well-developed and well-nourished.  HENT:  Head: Normocephalic and atraumatic.  Healing abrasion on face/eyelid  Eyes: Conjunctivae are normal. Pupils are equal, round, and reactive to light.  Neck:  C Collar.  Cardiovascular: Normal rate and regular rhythm.  Respiratory: Effort normal. No respiratory distress. He has no wheezes.  GI: Soft. He exhibits distension. Bowel sounds are decreased. There is tenderness.  Genitourinary:  Foley with clear urine  Musculoskeletal: He exhibits tenderness. He exhibits no edema.  Neurological: He is alert and oriented to person, place, and time. No cranial nerve deficit. Coordination normal.  Patient has normal sensation proximal to C6 dermatome. Patient has reduced sensation to pinprick but is able to identify the area touched At bilateral  C6 through sacral levels Patient has decreased tone in bilateral Lower extremities 4/5 bilateral deltoids, biceps, 4- triceps, 4 wrist extensors, trace right HI, 1+ left HI, right HF, KE and ADF/APF are 1/5. 0/5 left hip flexors and knee extensors ankle dorsiflexors and plantar flexors  Skin: Skin is warm and dry.  Cervical incision clean and intact   Assessment/Plan: 1. 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 Physiatrist is providing close team supervision and 24 hour management of active medical problems listed below. Physiatrist and rehab team continue to assess barriers to discharge/monitor patient progress toward functional and medical goals.  FIM: Function - Bathing Position: Bed Body parts bathed by patient: Right arm, Left arm, Chest, Abdomen, Right upper leg, Left upper leg Body parts bathed by helper: Front perineal area, Buttocks, Right lower leg, Left lower leg, Back Assist Level: 2 helpers, Assistive device Assistive Device Comment: LH sponge  Function- Upper Body Dressing/Undressing What is the patient wearing?: Pull over shirt/dress Pull over shirt/dress - Perfomed by helper: Thread/unthread right sleeve, Put head through opening, Pull shirt over trunk, Thread/unthread left sleeve Assist Level: 2 helpers Function - Lower Body Dressing/Undressing What is the patient wearing?: Pants, Socks Position: Bed Pants- Performed by helper: Thread/unthread right pants leg, Thread/unthread left pants leg, Fasten/unfasten pants Non-skid slipper socks- Performed by helper: Don/doff right sock, Don/doff left sock Assist Level: 2 Helpers  Function - Toileting Toileting activity did not occur: No continent bowel/bladder event Assist level:  Two helpers  Function - Archivist transfer activity did not occur: Safety/medical concerns  Function - Chair/bed  transfer Chair/bed transfer method: Lateral scoot Chair/bed transfer assist level: 2 helpers Chair/bed transfer assistive device: Sliding board, Armrests Chair/bed transfer details: Verbal cues for precautions/safety, Verbal cues for technique, Verbal cues for sequencing, Manual facilitation for weight shifting, Manual facilitation for placement  Function - Locomotion: Wheelchair Will patient use wheelchair at discharge?: Yes Type: Manual Assist Level: Dependent (Pt equals 0%) Assist Level: Dependent (Pt equals 0%) Assist Level: Dependent (Pt equals 0%) Function - Locomotion: Ambulation Ambulation activity did not occur: Safety/medical concerns Walk 10 feet activity did not occur: Safety/medical concerns Walk 50 feet with 2 turns activity did not occur: Safety/medical concerns Walk 150 feet activity did not occur: Safety/medical concerns Walk 10 feet on uneven surfaces activity did not occur: Safety/medical concerns  Function - Comprehension Comprehension: Auditory Comprehension assist level: Follows basic conversation/direction with no assist  Function - Expression Expression: Verbal Expression assist level: Expresses basic needs/ideas: With extra time/assistive device  Function - Social Interaction Social Interaction assist level: Interacts appropriately with others with medication or extra time (anti-anxiety, antidepressant).  Function - Problem Solving Problem solving assist level: Solves basic 75 - 89% of the time/requires cueing 10 - 24% of the time  Function - Memory Memory assist level: Complete Independence: No helper Patient normally able to recall (first 3 days only): Location of own room, Staff names and faces, That he or she is in a hospital Medical Problem List and Plan: 1. Functional deficits secondary to C6 ASIA C incomplete tetraplegia, spinal cord injury due to fall with C5-C6 fracture and neurogenic bowel and bladder 2. DVT Prophylaxis/Anticoagulation:  Pharmaceutical: Lovenox to contiue  -dopplers negative 3. Pain Management: Continue Lyrica BID for neuropathic pain. 4. Mood: Team to provide ego support.  .    -consider antidepressant, neuropsych eval completed 5. Neuropsych: This patient is capable of making decisions on his own behalf. 6. Skin/Wound Care: Routine pressure relief measures.  7. Fluids/Electrolytes/Nutrition: encourage PO.  I personally reviewed the patient's labs today- 8. ZOX:WRUEAVWU negatie. continue to encourage IS. Blood cultures if patient spikes again.  -recent cxr normal  -ucx + for klebsiella oxytoca---add septra for 7 days  -afebrile over last 48 hours 9. CV: abd binder/compression stockings to maintain sitting/upright bp  -does not require pharmaceutical rx at present 10. Acutre renal failure---volume depletion---responded to ivf   -decreased IVF to 75cc/hr yesterday  -bmet pending for today   LOS (Days) 3 A FACE TO FACE EVALUATION WAS PERFORMED  Russ Looper T 11/23/2014 7:28 AM

## 2014-11-23 NOTE — Progress Notes (Signed)
Occupational Therapy Session Note  Patient Details  Name: Carl [name] MRN: [id number] Date of Birth: January 16, 1951  Today's Date: 11/23/2014 OT Individual Time: 0830-0930 and 1400-1500 OT Individual Time Calculation (min): 60 min and 60 min   Short Term Goals: Week 1:  OT Short Term Goal 1 (Week 1): Pt will bathe in supine with mod A OT Short Term Goal 2 (Week 1): Pt will direct care for bed mobility with min questioning cues OT Short Term Goal 3 (Week 1): Pt will self feed 50% of meal with min A  Skilled Therapeutic Interventions/Progress Updates:    Session One: Pt seen for OT ADL bathing and dressing session. Pt in supine upon arrival, agreeable to tx, he declined full bathing/dressing session, stating that nursing had assisted with cleaning prior to session. See below for assist required for bathing/dressing tasks. Upon pt completing log roll he had incontinent episode of bowel, placed on bed pan to complete task. Pt voiced being able to feel that BM was taking place. Total A required for hygiene and clothing management.  Pt transferred to w/c and completed oral care seated in w/c at the sink. Hand over hand assist required for grasping toothbrush. Pt left in w/c at end of session, all needs in reach.  Pt educated regarding pressure relief schedule, OT goals, POC, and d/c planning.   Session Two:  Pt seen for OT session focusing on functional sitting balance and fine motor coordination. Pt in w/c upon arrival, agreeable to tx session. Pt taken to therapy gym and transferred onto mat with +2 assist using sliding board. While seated on mat with support from behind and min-mod steadying assist with VCs for positioning, pt completed large peg board task, able to grasp and release pegs and place into peg board. He demonstrated fair grasp/release abilities with B UEs. Pt returned to room at end of session, requesting return to bed. +2 required for all transfers. Pt left in supine upon arrival, all  needs in reach, RN made aware of pt's needs. Pt's BP remained stable throughout session: 94/48: sitting up in w/c 94/63 and 87/50: sitting on EOM  Therapy Documentation Precautions:  Precautions Precautions: Cervical, Fall Precaution Comments: Using BLE ACE wraps for BP control at this time. Monitor this Required Braces or Orthoses: Cervical Brace Cervical Brace: Hard collar, At all times Restrictions Weight Bearing Restrictions: No Vital Signs:  Pain: Pain Assessment Pain Assessment: 0-10 Pain Score: 3   Function:   Eating Eating               Grooming Oral Care,Brush Teeth, Clean Dentures Activity:      Assist Level: Touching or steadying assistance(Pt > 75%) (Hand over hand assist)      Wash, Rinse, Dry Face Activity          Wash, Rinse, Dry Hands Activity          Brush, Comb Hair Activity        Shave Activity          Apply Makeup Activity                                                                Lower Body Dressing/Undressing Lower body dressing   What is the patient wearing?: Pants;Socks;Shoes  Pants- Performed by helper: Thread/unthread right pants leg;Thread/unthread left pants leg;Pull pants up/down       Socks - Performed by helper: Don/doff right sock;Don/doff left sock   Shoes - Performed by helper: Don/doff right shoe;Don/doff left shoe;Fasten left;Fasten right          Lower body assist          Bed Mobility Roll left and right activity   Assist level: 2 helpers  Sit to lying activity   Assist level: 2 helpers  Lying to sitting activity   Assist level: 2 helpers  Mobility details     Transfers Sit to stand transfer        Chair/bed transfer   Chair/bed transfer method: Lateral scoot (Sliding Board)   Chair/bed transfer assistive device: Sliding board;Armrests;Bedrails               Therapy/Group: Individual Therapy  [name], Carl Basurto C 11/23/2014, 11:54 AM

## 2014-11-23 NOTE — IPOC Note (Signed)
Overall Plan of Care [facility]) Patient Details Name: Carl [name] MRN: [id number] DOB: Dec 19, 1950  Admitting Diagnosis: C5-6 FX WITH INCOMPLETE SCI   Hospital Problems: Principal Problem:   C6 spinal cord injury Active Problems:   C5 vertebral fracture   Quadriplegia   Neurogenic bowel   Neurogenic bladder   Fever of unknown origin   Acute renal failure syndrome     Functional Problem List: Nursing Bladder, Bowel, Endurance, Medication Management, Motor, Nutrition, Pain, Safety, Skin Integrity  PT Balance, Edema, Endurance, Motor, Pain, Safety, Sensory, Skin Integrity  OT Balance, Endurance, Safety, Motor, Pain, Sensory, Skin Integrity, Edema  SLP    TR Edema, Endurance, Motor, Pain, Safety, Skin Integrity       Basic ADL's: OT Eating, Grooming, Bathing, Dressing, Toileting     Advanced  ADL's: OT       Transfers: PT Bed Mobility, Bed to Chair, Car, Occupational psychologist, Research scientist (life sciences): PT Ambulation, Psychologist, prison and probation services, Stairs     Additional Impairments: OT Fuctional Use of Upper Extremity  SLP        TR      Anticipated Outcomes Item Anticipated Outcome  Self Feeding Set- up  Swallowing      Basic self-care  Min A  Toileting  Min A   Bathroom Transfers Min A  Bowel/Bladder  Incontinent to bowel and bladder.Using condom cath.  Transfers  min A basic transfers; mod +2 for car transfer  Locomotion  min A w/c mobility in manual w/c; may require power w/c depending on functional return  Communication     Cognition     Pain  Less than 3 on sacale 1 to 10  Safety/Judgment  Free from falls during his stay in rehab.   Therapy Plan: PT Intensity: Minimum of 1-2 x/day ,45 to 90 minutes PT Frequency: 5 out of 7 days PT Duration Estimated Length of Stay: 4 weeks OT Intensity: Minimum of 1-2 x/day, 45 to 90 minutes OT Frequency: 5 out of 7 days OT Duration/Estimated Length of Stay: 4 weeks         Team Interventions: Nursing  Interventions Patient/Family Education, Bladder Management, Bowel Management, Disease Management/Prevention, Pain Management, Medication Management, Skin Care/Wound Management, Discharge Planning  PT interventions Ambulation/gait training, Balance/vestibular training, Community reintegration, Discharge planning, Disease management/prevention, DME/adaptive equipment instruction, Functional mobility training, Neuromuscular re-education, Pain management, Patient/family education, Psychosocial support, Skin care/wound management, Splinting/orthotics, Stair training, Therapeutic Activities, Therapeutic Exercise, UE/LE Strength taining/ROM, UE/LE Coordination activities, Wheelchair propulsion/positioning  OT Interventions Warden/ranger, Cognitive remediation/compensation, Discharge planning, Community reintegration, DME/adaptive equipment instruction, Functional mobility training, Neuromuscular re-education, Pain management, Psychosocial support, Patient/family education, Self Care/advanced ADL retraining, Therapeutic Activities, Therapeutic Exercise, Splinting/orthotics, UE/LE Strength taining/ROM, UE/LE Coordination activities, Wheelchair propulsion/positioning  SLP Interventions    TR Interventions Adaptive equipment instruction, 1:1 session, Warden/ranger, Functional mobility training, Firefighter, Equities trader education, Therapeutic activities, Recreation/leisure participation, Therapeutic exercise, UE/LE Coordination activities, Wheelchair propulsion/positioning  SW/CM Interventions Discharge Planning, Facilities manager, Restaurant manager, fast food    Team Discharge Planning: Destination: PT-Home ,OT- Home , SLP-  Projected Follow-up: PT-Home health PT, 24 hour supervision/assistance, OT-  Home health OT, SLP-  Projected Equipment Needs: PT-Sliding board, Wheelchair cushion (measurements), Wheelchair (measurements), Other (comment) (may need hospital bed), OT- 3  in 1 bedside comode, To be determined, SLP-  Equipment Details: PT- , OT-  Patient/family involved in discharge planning: PT- Patient, Family member/caregiver,  OT-Patient, Family member/caregiver, SLP-   MD ELOS: 20-28d Medical Rehab Prognosis:  Good Assessment:  64 y.o. male who fell from the roof while painting hsi house with complaints of neck pain and inability to move BLE wiand weakness BUE. Patient hypotensive at admission requiring dopamine and GCS 15. Bedside ultrasound negative and patient with poor air movement with poor respiratory effort. Work up revealed comminuted left C5 and C6 fractures with prevertebral and paraspinal edema, extensive cervical strain and cervical cord contusion with swelling extending C5-C6 with intramedullary hematoma, nondisplaced right 9th and 10th ribs and right eyelid laceration. Patient with numbness BLE as well as numbness/pain in chest and abdominal wall. Right brow lacerations repaired by Dr. Randon Goldsmith and no signs of globe injury noted. He was placed on bedrest with Aspen collar in place and underwent ACDF with PEEk cage by Dr. Venetia Maxon on 08/09   Now requiring 24/7 Rehab RN,MD, as well as CIR level PT, OT and SLP.  Treatment team will focus on ADLs and mobility with goals set at [facility] A  See Team Conference Notes for weekly updates to the plan of care

## 2014-11-24 ENCOUNTER — Inpatient Hospital Stay (HOSPITAL_COMMUNITY): Payer: Federal, State, Local not specified - PPO

## 2014-11-24 ENCOUNTER — Inpatient Hospital Stay (HOSPITAL_COMMUNITY): Payer: Federal, State, Local not specified - PPO | Admitting: Occupational Therapy

## 2014-11-24 MED ORDER — SENNOSIDES-DOCUSATE SODIUM 8.6-50 MG PO TABS
2.0000 | ORAL_TABLET | Freq: Every day | ORAL | Status: DC
  Administered 2014-11-24 – 2014-12-07 (×14): 2 via ORAL
  Filled 2014-11-24 (×17): qty 2

## 2014-11-24 NOTE — Progress Notes (Signed)
Physical Therapy Session Note  Patient Details  Name: Carl [name] MRN: [id number] Date of Birth: October 29, 1950  Today's Date: 11/24/2014 PT Individual Time: 1400-1500 PT Individual Time Calculation (min): 60 min   Short Term Goals: Week 1:  PT Short Term Goal 1 (Week 1): Pt will be able to verbalize need for pressure relief when OOB in w/c every 30 min PT Short Term Goal 2 (Week 1): Pt will be able to roll with max A of 1 using bed rails PRN PT Short Term Goal 3 (Week 1): Pt will be able to tolerate EOB x 10 min with max A  during functional task PT Short Term Goal 4 (Week 1): Pt will be able to tolerate OOB x 2 hours a day  Skilled Therapeutic Interventions/Progress Updates:   Pt up in w/c in reclined position with intermittent reports of being lightheaded. Agreeable for back to bed with focus on functional transfer (see details below) with focus on technique and trunk control, sitting balance EOB with min to total A and lateral weightshifts onto UE, bed mobility (see below), and supine neuro re-ed for RLE activation and BLE ROM/stretching. Performed hamstring stretch, heel cord stretches, and hip IR/ER and trunk rotation stretching x 3 reps each x 30 second hold. Pt able to activate RLE PF and DF and hip adduction today in supine. Possible trace ankle movement on L. Performed AAROM/PROM to hip, knee and ankle x 10 reps each BLE. Educated on importance of stretching/therex program for flexibility and increasing strength. PRAFO boots donned and positioned for comfort.   Therapy Documentation Precautions:  Precautions Precautions: Cervical, Fall Precaution Comments: Using BLE ACE wraps for BP control at this time. Monitor this Required Braces or Orthoses: Cervical Brace Cervical Brace: Hard collar, At all times Restrictions Weight Bearing Restrictions: No   Pain: Denies pain. Reports some dizziness/blurred vision at times.    See DocFlowsheets for vitals.  Function:    Bed  Mobility Roll left and right activity   Assist level: 2 helpers  Sit to lying activity   Assist level: 2 helpers  Lying to sitting activity   Assist level: 2 helpers  Mobility details Bed mobility details: Verbal cues for techniques;Manual facilitation for weight shifting;Manual facilitation for placement   Transfers Sit to stand transfer        Chair/bed transfer   Chair/bed transfer method: Lateral scoot Chair/bed transfer assist level: 2 helpers Chair/bed transfer assistive device: Sliding board;Armrests;Orthosis   Chair/bed transfer details: Verbal cues for precautions/safety;Verbal cues for technique;Verbal cues for sequencing;Manual facilitation for weight shifting;Manual facilitation for placement   Toilet transfer        Car transfer          Locomotion Ambulation          Walk 10 feet activity      Walk 50 feet with 2 turns activity      Walk 150 feet activity      Walk 10 feet on uneven surfaces activity      Stairs          Walk up/down 1 step activity        Walk up/down 4 steps activity      Walk up/down 12 steps activity      Pick up small objects from floor      Wheelchair          Wheel 50 feet with 2 turns activity      Wheel 150 feet activity  Cognition Comprehension    Expression    Social Interaction    Problem Solving    Memory      Therapy/Group: Individual Therapy  Karolee Stamps Darrol Poke, PT, DPT  11/24/2014, 3:25 PM

## 2014-11-24 NOTE — Progress Notes (Signed)
Occupational Therapy Session Note  Patient Details  Name: Carl [name] MRN: [id number] Date of Birth: May 24, 1950  Today's Date: 11/24/2014 OT Individual Time: 0800-0900 OT Individual Time Calculation (min): 60 min    Short Term Goals: Week 1:  OT Short Term Goal 1 (Week 1): Pt will bathe in supine with mod A OT Short Term Goal 2 (Week 1): Pt will direct care for bed mobility with min questioning cues OT Short Term Goal 3 (Week 1): Pt will self feed 50% of meal with min A  Skilled Therapeutic Interventions/Progress Updates:    Pt resting in bed upon arrival with wife present.  Focus of session on BADL retraining and bed mobility to decreased burden of care and increase independence with BADLs.  Pt incontinent of bowel and required tot A + 2 for hygiene.  See function below.  Pt initiates rolling right and left but requires assistance with positioning of BLE and support behind back to complete tasks.  Pt initiates and assists with threading BUE into shirt sleeves.  Pt directed assistance throughout session.    Therapy Documentation Precautions:  Precautions Precautions: Cervical, Fall Precaution Comments: Using BLE ACE wraps for BP control at this time. Monitor this Required Braces or Orthoses: Cervical Brace Cervical Brace: Hard collar, At all times Restrictions Weight Bearing Restrictions: No  Pain: Pain Assessment Pain Assessment: 0-10 Pain Score: 3  Pain Type: Acute pain Pain Location: Shoulder Pain Orientation: Right;Left Pain Descriptors / Indicators: Aching Pain Onset: On-going Patients Stated Pain Goal: 2 Pain Intervention(s): RN made aware;Repositioned Multiple Pain Sites: No    Function:                                                    Upper Body Dressing/Undressing Upper body dressing   What is the patient wearing?: Pull over shirt/dress       Pull over shirt/dress - Perfomed by helper: Thread/unthread right sleeve;Put head through opening;Pull shirt over  trunk;Thread/unthread left sleeve        Upper body assist Assist Level: 2 helpers       Lower Body Dressing/Undressing Lower body dressing   What is the patient wearing?: Pants       Pants- Performed by helper: Thread/unthread right pants leg;Thread/unthread left pants leg;Pull pants up/down                      Lower body assist Assist Level: 2 Helpers         Bed Mobility Roll left and right activity   Assist level: 2 helpers  Sit to lying activity      Lying to sitting activity   Assist level: 2 helpers  Mobility details Bed mobility details: Verbal cues for techniques;Manual facilitation for weight shifting;Manual facilitation for placement   Transfers Sit to stand transfer        Chair/bed transfer   Chair/bed transfer method: Lateral scoot Chair/bed transfer assist level: 2 helpers Chair/bed transfer assistive device: Sliding board;Armrests;Orthosis   Chair/bed transfer details: Verbal cues for precautions/safety;Verbal cues for technique;Verbal cues for sequencing;Manual facilitation for weight shifting;Manual facilitation for placement            Therapy/Group: Individual Therapy  Rich Brave 11/24/2014, 12:17 PM

## 2014-11-24 NOTE — Progress Notes (Signed)
Social Work Patient ID: Carl [name], male   DOB: [birth year], 64 y.o.   MRN: [id number]   Met with pt and wife yesterday to review team conference.  Both area aware and agreeable with targeted d/c date of 9/13, however, pt very hopeful that this will be moved up earlier.  Agreeable with goals of min/mod assistance at w/c level and report that ramp is already underway.  Pt's affect remains flat.  Neuropsychology consult prior to my visit.  Will continue to follow for support and d/c planning needs.  Ralyn Stlaurent, LCSW

## 2014-11-24 NOTE — Progress Notes (Addendum)
[facility]     PROGRESS NOTE    Subjective/Complaints: Wife in helping with breakfast. Spirits a bit improved today  ROS: Pt denies  , rash/itching, headache, blurred or double vision, nausea, vomiting, abdominal pain, diarrhea, chest pain, shortness of breath, palpitations, dysuria, dizziness,  back pain, bleeding, anxiety, or depression   Objective: Vital Signs: Blood pressure 102/53, pulse 42, temperature 99 F (37.2 C), temperature source Oral, resp. rate 18, height  (1.727 m), weight 87.59 kg (193 lb 1.6 oz), SpO2 93 %. No results found.  Recent Labs  11/23/14 0815  WBC 15.0*  HGB 10.7*  HCT 31.5*  PLT 319    Recent Labs  11/22/14 0525 11/23/14 0815  NA 132* 136  K 4.6 4.5  CL 99* 101  GLUCOSE 103* 119*  BUN 37* 24*  CREATININE 1.29* 0.87  CALCIUM 8.1* 8.4*   CBG (last 3)  No results for input(s): GLUCAP in the last 72 hours.  Wt Readings from Last 3 Encounters:  11/22/14 87.59 kg (193 lb 1.6 oz)  11/16/14 90.719 kg (200 lb)    Physical Exam:  Constitutional: He is oriented to person, place, and time. He appears well-developed and well-nourished.  HENT:  Head: Normocephalic and atraumatic.  Healing abrasion on face/right eyelid  Eyes: Conjunctivae are normal. Pupils are equal, round, and reactive to light.  Neck:  C Collar.  Cardiovascular: Normal rate and regular rhythm.  Respiratory: Effort normal. No respiratory distress. He has no wheezes.  GI: Soft. He exhibits distension. Bowel sounds are decreased. There is tenderness.  Genitourinary:  Foley with clear urine  Musculoskeletal: He exhibits tenderness. He exhibits no edema.  Neurological: He is alert and oriented to person, place, and time. No cranial nerve deficit. Coordination normal.  Patient has normal sensation proximal to C6 dermatome. Patient has reduced sensation to pinprick but is able to identify the area touched At bilateral C6 through sacral  levels Patient has decreased tone in bilateral Lower extremities 4/5 bilateral deltoids, biceps, 4- triceps, 4 wrist extensors, trace right HI, 1+ to 2- left HI, right HF, KE and ADF/APF are 1/5. 0/5 left hip flexors and knee extensors ankle dorsiflexors and plantar flexors  Skin: Skin is warm and dry.  Cervical incision clean and intact   Assessment/Plan: 1. 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 Physiatrist is providing close team supervision and 24 hour management of active medical problems listed below. Physiatrist and rehab team continue to assess barriers to discharge/monitor patient progress toward functional and medical goals.  FIM: Function - Bathing Position: Bed Body parts bathed by patient: Right arm, Left arm, Chest, Abdomen, Right upper leg, Left upper leg Body parts bathed by helper: Front perineal area, Buttocks, Right lower leg, Left lower leg, Back Assist Level: 2 helpers, Assistive device Assistive Device Comment: LH sponge  Function- Upper Body Dressing/Undressing What is the patient wearing?: Pull over shirt/dress Pull over shirt/dress - Perfomed by helper: Thread/unthread right sleeve, Put head through opening, Pull shirt over trunk, Thread/unthread left sleeve Assist Level: 2 helpers Function - Lower Body Dressing/Undressing What is the patient wearing?: Pants, Socks, Shoes Position: Bed Pants- Performed by helper: Thread/unthread right pants leg, Thread/unthread left pants leg, Pull pants up/down Non-skid slipper socks- Performed by helper: Don/doff right sock, Don/doff left sock Socks - Performed by helper: Don/doff right sock, Don/doff left sock Shoes - Performed by helper:  Don/doff right shoe, Don/doff left shoe, Fasten left, Fasten right Assist Level: 2 Helpers  Function - Toileting Toileting activity did not occur: No continent bowel/bladder  event Assist level: Two helpers  Function - Archivist transfer activity did not occur: Safety/medical concerns  Function - Chair/bed transfer Chair/bed transfer method: Lateral scoot (slideboard) Chair/bed transfer assist level: 2 helpers Chair/bed transfer assistive device: Sliding board, Armrests, Orthosis Chair/bed transfer details: Verbal cues for precautions/safety, Verbal cues for technique, Verbal cues for sequencing, Manual facilitation for weight shifting, Manual facilitation for placement  Function - Locomotion: Wheelchair Will patient use wheelchair at discharge?: Yes Type: Manual Assist Level: Dependent (Pt equals 0%) (Tilt in space w/c) Assist Level: Dependent (Pt equals 0%) Assist Level: Dependent (Pt equals 0%) Function - Locomotion: Ambulation Ambulation activity did not occur: Safety/medical concerns Walk 10 feet activity did not occur: Safety/medical concerns Walk 50 feet with 2 turns activity did not occur: Safety/medical concerns Walk 150 feet activity did not occur: Safety/medical concerns Walk 10 feet on uneven surfaces activity did not occur: Safety/medical concerns  Function - Comprehension Comprehension: Auditory Comprehension assist level: Follows basic conversation/direction with no assist  Function - Expression Expression: Verbal Expression assist level: Expresses basic needs/ideas: With extra time/assistive device  Function - Social Interaction Social Interaction assist level: Interacts appropriately with others with medication or extra time (anti-anxiety, antidepressant).  Function - Problem Solving Problem solving assist level: Solves basic problems with no assist  Function - Memory Memory assist level: Complete Independence: No helper Patient normally able to recall (first 3 days only): Location of own room, Staff names and faces, That he or she is in a hospital Medical Problem List and Plan: 1. Functional deficits secondary to  C6 ASIA C incomplete tetraplegia, spinal cord injury due to fall with C5-C6 fracture and neurogenic bowel and bladder 2. DVT Prophylaxis/Anticoagulation: Pharmaceutical: Lovenox to contiue  -dopplers negative 3. Pain Management: Continue Lyrica BID for neuropathic pain. 4. Mood: Team to provide ego support.  .    -consider antidepressant, neuropsych eval completed 5. Neuropsych: This patient is capable of making decisions on his own behalf. 6. Skin/Wound Care: Routine pressure relief measures.  7. Fluids/Electrolytes/Nutrition: encourage PO.   8. ZOX:WRUEAVWU negatie. continue to encourage IS. Blood cultures if patient spikes again.  -recent cxr normal  -ucx + for klebsiella oxytoca--continue septra for 7 total days  -afebrile over last 48 hours 9. CV: abd binder/compression stockings to maintain sitting/upright bp  -does not require pharmaceutical rx at present  -can use coban wrap,compression to finger to decreased pinching from ring 10. Acutre renal failure---volume depletion---responded to ivf   -dc IVF---push po  -I personally reviewed the patient's labs today.  11. Neurogenic bladder---continue foley through weekend 12. Neurogenic bowel: working on qd bowel program   LOS (Days) 4 A FACE TO FACE EVALUATION WAS PERFORMED  SWARTZ,ZACHARY T 11/24/2014 8:03 AM

## 2014-11-24 NOTE — Progress Notes (Signed)
Aide reported to this nurse Bp- 82/36,  HR- 56. Assessment done by this nurse rechecked Bp- 85/43, HR-58 manually. Pt stated that he was a little dizzy from being up in the chair but otherwise asymptomatic. No noted distress. PA notified, no new orders. Will continue to monitor.

## 2014-11-24 NOTE — Progress Notes (Signed)
Occupational Therapy Note  Patient Details  Name: Carl [name] MRN: [id number] Date of Birth: 09-21-1950  Today's Date: 11/24/2014 OT Individual Time: 1300-1400 OT Individual Time Calculation (min): 60 min   Pt seen for OT therapy session focusing on fine motor coordination. Pt reclined back in w/[name] upon arrival with RN present. Pt's BP 85/42 while reclined in w/[name] with ACE wraps on. RN requested pt to stay in w/[name] for first part of session and she would reassess midway through session. Pt completed 3-d construction of object using PVC pipe with emphasis on fine motor coordination, in-hand manipulation, and grip strength. Pt displayed ability to move B UEs in functional manner to place pipes, requiring increased time and concentration to fit small pipe ends together. Pt demonstrated difficulty pulling pipes apart at end due to decreased grip strength. Pt's RN came back into room to re-assess BP, BP remained stable at 85/42, and RN cont to encourage for pt to not transfer. Pt then completed task of opening/closing commonly used self -care items/ small bottles. Koban applied around bottles to assist with using B UE at stabilizer level. Pt with decreased lateral pinch strength to grasp onto lid to twist open cap and flip open top. Pt verbalized need for pressure relief at end of session, pt left in reclined position at end of session with hand off to PT.  Pt's caregiver educated during session regarding pt progress, OT goals, recommendation to keep journal of pt's progress, caregiver burden, and d/[name] planning.   Melvyn Neth, Carl [name] 11/24/2014, 2:27 PM

## 2014-11-24 NOTE — Progress Notes (Signed)
Occupational Therapy Note  Patient Details  Name: Carl [name] MRN: [id number] Date of Birth: 10-Dec-1950  Today's Date: 11/24/2014 OT Individual Time: 1000-1100 OT Individual Time Calculation (min): 60 min   Pt c/o 3/10 pain in Bilateral shoulders; repositioned Individual Therapy  Pt resting in bed upon arrival and agreeable to therapy.  Pt directed care for sequencing and assistance required to complete supine->sit EOB in preparation for SBT to w/c.  Pt required tot A + 2 for supine->sit and SBT.  Pt transitioned to therapy gym and engaged in BUE therapeutic activities with focus of finger flexion/extension (flexion-R>L, extension-L>R), wrist flexion/extension (R>L), shoulder flexion, elbow flexion, and grasping objects.  Pt also engaged in dynamics sitting balance tasks including trunk flexion and resting elbows on knees and then returning to upright position.  Focus on increased BUE strength and function to increased independence with BADLs.  Pt returned to room and remained in w/c with wife present.   Lavone Neri [facility] 11/24/2014, 12:25 PM

## 2014-11-24 NOTE — Consult Note (Signed)
  INITIAL DIAGNOSTIC EVALUATION - [facility]   MEDICAL NECESSITY:  Carl [name] was seen on the [facility] Unit for an initial diagnostic evaluation owing to the patient's diagnosis of SCI.   According to medical records, Carl [name] was admitted to the rehab unit owing to "Functional deficits secondary to C6 ASIA C incomplete tetraplegia, spinal cord injury due to fall with C5-C6 fracture and neurogenic bowel and bladder." Records also indicate that he is a "64 y.o. male who fell from the roof while painting his house with complaints of neck pain and inability to move BLE and weakness BUE.  Patient hypotensive at admission requiring dopamine and GCS 15. Bedside ultrasound negative and patient with poor air movement with poor respiratory effort. Work up revealed comminuted left C5 and C6 fractures with prevertebral and paraspinal edema, extensive cervical strain and cervical cord contusion with swelling extending C5-C6 with intramedullary hematoma, nondisplaced right 9th and 10th ribs and right eyelid laceration."  During today's appointment, Carl [name] was accompanied by his wife. She assisted with the history. He said that he hit his head when he fell from the ladder and may have lost consciousness for a second two.  Patient denied any cognitive concerns post-accident. His wife agreed.   Carl [name] denied any history of treatment for depression or anxiety. He also denied experiencing any prolonged periods of mood symptoms at any time. He is not happy that this event occurred but he is adjusting as well as can be expected. No adjustment issues endorsed. Suicidal/homicidal ideation, plan or intent was denied. No manic or hypomanic episodes were reported. The patient denied ever experiencing any auditory/visual hallucinations. No major behavioral or personality changes were endorsed.   In therapy Carl [name] is making slow progress but he  does feel that he is making strides. He described the rehab staff as "great." No barriers to therapy identified. He is retired from [employer] and presently operates as a Education officer, environmental. He wishes to eventually return to this line of work at some point upon discharge. He has ample social support via his wife and family.   PROCEDURES ADMINISTERED: [1 unit 90791] Diagnostic clinical interview  Review of available records  Behavioral Evaluation: Carl [name] was appropriately dressed for season and situation, and he appeared tidy and well-groomed. He was friendly and rapport was easily established. His speech was as expected and he was able to express ideas effectively. His affect was somewhat blunted. Attention and motivation were good.    SUMMARY & IMPRESSION: Overall, Carl [name] denied suffering from any major cognitive or emotional difficulties post-accident. He seems to be adjusting well to this admission and he has ample social support, particularly via his wife. At this time I see no need for neuropsychology to follow-up with the patient for the remainder of this admission unless requested by the patient or rehab staff.     Debbe Mounts, Psy.D.  Clinical Neuropsychologist

## 2014-11-25 ENCOUNTER — Inpatient Hospital Stay (HOSPITAL_COMMUNITY): Payer: Federal, State, Local not specified - PPO | Admitting: Physical Therapy

## 2014-11-25 ENCOUNTER — Inpatient Hospital Stay (HOSPITAL_COMMUNITY): Payer: Federal, State, Local not specified - PPO | Admitting: Occupational Therapy

## 2014-11-25 DIAGNOSIS — S14106D Unspecified injury at C6 level of cervical spinal cord, subsequent encounter: Principal | ICD-10-CM

## 2014-11-25 MED ORDER — OXYCODONE HCL 5 MG PO TABS
5.0000 mg | ORAL_TABLET | ORAL | Status: DC | PRN
  Administered 2014-11-26: 5 mg via ORAL
  Filled 2014-11-25 (×2): qty 1

## 2014-11-25 NOTE — Care Management Note (Signed)
Inpatient Rehabilitation Center Individual Statement of Services  Patient Name:  Carl [name]  Date:  11/23/2014  Welcome to the Inpatient Rehabilitation Center.  Our goal is to provide you with an individualized program based on your diagnosis and situation, designed to meet your specific needs.  With this comprehensive rehabilitation program, you will be expected to participate in at least 3 hours of rehabilitation therapies Monday-Friday, with modified therapy programming on the weekends.  Your rehabilitation program will include the following services:  Physical Therapy (PT), Occupational Therapy (OT), 24 hour per day rehabilitation nursing, Therapeutic Recreaction (TR), Neuropsychology, Case Management (Social Worker), Rehabilitation Medicine, Nutrition Services and Pharmacy Services  Weekly team conferences will be held on Tuesdays to discuss your progress.  Your Social Worker will talk with you frequently to get your input and to update you on team discussions.  Team conferences with you and your family in attendance may also be held.  Expected length of stay: 3-4 weeks  Overall anticipated outcome: min/ mod assist @ wheelchair level  Depending on your progress and recovery, your program may change. Your Social Worker will coordinate services and will keep you informed of any changes. Your Social Worker's name and contact numbers are listed  below.  The following services may also be recommended but are not provided by the Inpatient Rehabilitation Center:   Driving Evaluations  Home Health Rehabiltiation Services  Outpatient Rehabilitation Services  Vocational Rehabilitation   Arrangements will be made to provide these services after discharge if needed.  Arrangements include referral to agencies that provide these services.  Your insurance has been verified to be:  [facility] primary doctor is:  Dr. Jacqlyn Larsen  Pertinent information will be shared with your doctor and your  insurance company.  Social Worker:  [place], [place] [phone] or ([phone]   Information discussed with and copy given to patient by: Amada Jupiter, 11/23/2014, 5:32 PM

## 2014-11-25 NOTE — Progress Notes (Signed)
Occupational Therapy Session Note  Patient Details  Name: Carl [name] MRN: [id number] Date of Birth: 18-Jun-1950  Today's Date: 11/25/2014 OT Individual Time: 1200-1300 OT Individual Time Calculation (min): 60 min    Short Term Goals: Week 1:  OT Short Term Goal 1 (Week 1): Pt will bathe in supine with mod A OT Short Term Goal 2 (Week 1): Pt will direct care for bed mobility with min questioning cues OT Short Term Goal 3 (Week 1): Pt will self feed 50% of meal with min A  Skilled Therapeutic Interventions/Progress Updates:   Patient seen for occupational therapy this afternoon bedlevel.  Patient supine in bed upon therapist arrival with only brief and thigh length TED hose donned.  Patient reported lunch had just arrived.  Patient and wife educated on OT working on self feeding as goal; patient agreeable.  Patient provided with universal cuff to R hand and bent utensil.  Patient positioned upright in bed with HOB elevated for more functional position.  Patient able to scoop food and bring to mouth 100% of the time, after setup from therapist to cut food and position within patient's reach.  Increased time required but min rest breaks needed.  Patient requires assist 100% of the time for grasp of cup and bringing cup to mouth.  Able to chew/swallow.    Patient educated on tenodesis grasp for functional grasp with R hand and importance of not stretching R digits at this time.  Patient's wife present; both verbalized understanding but will require reinforcement for improved understanding.    Patient reported he felt he had bowel incontinence.  Patient returned to supine position.  Rolls R/L with total assist +1 for management for bowel incontinence.  Verbal cues for hooking UE over bed rail to assist with stabilization in sidelying position; good participation.  Total assist hygiene and replacement of brief.  TED hose doffed secondary to incontinence leak onto TED hose while removing brief.   Patient's RN alerted to removal of thigh length TED hose.  Patient re-positioned in bed with total assist +1.  Metaboots donned to B feet for optimal positioning and prevention of foot drop.  Patient's BUEs positioned on pillows.  Educated patient on turning in bed every 2 hours to prevent skin breakdown; will require reinforcement.    Therapy Documentation Precautions:  Precautions Precautions: Cervical, Fall Precaution Comments: Using BLE ACE wraps for BP control at this time. Monitor this Required Braces or Orthoses: Cervical Brace Cervical Brace: Hard collar, At all times Restrictions Weight Bearing Restrictions: No Vital Signs: Therapy Vitals BP: (!) 97/54 mmHg Pain: Pain Assessment Pain Assessment: 0-10 Pain Score: 3  Pain Type: Acute pain Pain Location: Shoulder Pain Orientation: Left Pain Descriptors / Indicators: Aching Pain Onset: On-going Pain Intervention(s): Medication (See eMAR) Multiple Pain Sites: No  Function:   Eating Eating   Eating Assist Level: Set up assist for;More than reasonable amount of time;Help managing cup/glass   Eating Set Up Assist For: Opening containers;Cutting food       Toileting Toileting  total assist        Toileting assist Dependent for bowel incontinence   Bed Mobility Roll left and right activity   Assist level: Total assist (Pt < 25%)  Sit to lying activity      Lying to sitting activity      Mobility details Bed mobility details: Visual cues/gestures for sequencing;Verbal cues for techniques;Verbal cues for precautions/safety;Manual facilitation for weight shifting    Therapy/Group: Individual Therapy  Boneta Lucks  L 11/25/2014, 1:51 PM

## 2014-11-25 NOTE — Progress Notes (Signed)
[facility]     PROGRESS NOTE    Subjective/Complaints: Pt working with PT, slept in ACE wraps yest, switching to TEDs thigh high  ROS: Pt denies  , rash/itching, headache, blurred or double vision, nausea, vomiting, abdominal pain, diarrhea, chest pain, shortness of breath, palpitations, dysuria, dizziness,  back pain, bleeding, anxiety, or depression   Objective: Vital Signs: Blood pressure 93/54, pulse 44, temperature 98.2 F (36.8 C), temperature source Oral, resp. rate 17, height 5\' 8"  (1.727 m), weight 87.59 kg (193 lb 1.6 oz), SpO2 93 %. No results found.  Recent Labs  11/23/14 0815  WBC 15.0*  HGB 10.7*  HCT 31.5*  PLT 319    Recent Labs  11/23/14 0815  NA 136  K 4.5  CL 101  GLUCOSE 119*  BUN 24*  CREATININE 0.87  CALCIUM 8.4*   CBG (last 3)  No results for input(s): GLUCAP in the last 72 hours.  Wt Readings from Last 3 Encounters:  11/22/14 87.59 kg (193 lb 1.6 oz)  11/16/14 90.719 kg (200 lb)    Physical Exam:  Constitutional: He is oriented to person, place, and time. He appears well-developed and well-nourished.  HENT:  Head: Normocephalic and atraumatic.  Healing abrasion on face/right eyelid  Eyes: Conjunctivae are normal. Pupils are equal, round, and reactive to light.  Neck:  C Collar.  Cardiovascular: Normal rate and regular rhythm.  Respiratory: Effort normal. No respiratory distress. He has no wheezes.  GI: Soft. He exhibits distension. Bowel sounds are decreased. There is tenderness.  Genitourinary:  Foley with clear urine  Musculoskeletal: He exhibits tenderness. He exhibits no edema.  Neurological: He is alert and oriented to person, place, and time. No cranial nerve deficit. Coordination normal.  Patient has normal sensation proximal to C6 dermatome. Patient has reduced sensation to pinprick but is able to identify the area touched At bilateral C6 through sacral levels Patient has decreased  tone in bilateral Lower extremities 4/5 bilateral deltoids, biceps, 4- triceps, 4 wrist extensors, trace right HI, 1+ to 2- left HI, right HF, KE and ADF/APF are 1/5. 0/5 left hip flexors and knee extensors ankle dorsiflexors and plantar flexors  Skin: Skin is warm and dry.  Cervical incision clean and intact   Assessment/Plan: 1. 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 Physiatrist is providing close team supervision and 24 hour management of active medical problems listed below. Physiatrist and rehab team continue to assess barriers to discharge/monitor patient progress toward functional and medical goals.  FIM: Function - Bathing Position: Bed Body parts bathed by patient: Right arm, Left arm, Chest, Abdomen, Right upper leg, Left upper leg Body parts bathed by helper: Front perineal area, Buttocks, Right lower leg, Left lower leg, Back Assist Level: 2 helpers, Assistive device Assistive Device Comment: LH sponge  Function- Upper Body Dressing/Undressing What is the patient wearing?: Pull over shirt/dress Pull over shirt/dress - Perfomed by helper: Thread/unthread right sleeve, Put head through opening, Pull shirt over trunk, Thread/unthread left sleeve Assist Level: 2 helpers Function - Lower Body Dressing/Undressing What is the patient wearing?: Pants Position: Bed Pants- Performed by helper: Thread/unthread right pants leg, Thread/unthread left pants leg, Pull pants up/down Non-skid slipper socks- Performed by helper: Don/doff right sock, Don/doff left sock Socks - Performed by helper: Don/doff right sock, Don/doff left sock Shoes - Performed by helper: Don/doff right shoe, Don/doff left shoe, Fasten  left, Fasten right Assist Level: 2 Helpers  Function - Toileting Toileting activity did not occur: No continent bowel/bladder event Assist level: Two helpers  Function -  Archivist transfer activity did not occur: Safety/medical concerns  Function - Chair/bed transfer Chair/bed transfer method: Lateral scoot Chair/bed transfer assist level: 2 helpers Chair/bed transfer assistive device: Sliding board, Armrests, Orthosis Chair/bed transfer details: Verbal cues for precautions/safety, Verbal cues for technique, Verbal cues for sequencing, Manual facilitation for weight shifting, Manual facilitation for placement  Function - Locomotion: Wheelchair Will patient use wheelchair at discharge?: Yes Type: Manual Assist Level: Dependent (Pt equals 0%) (Tilt in space w/c) Assist Level: Dependent (Pt equals 0%) Assist Level: Dependent (Pt equals 0%) Function - Locomotion: Ambulation Ambulation activity did not occur: Safety/medical concerns Walk 10 feet activity did not occur: Safety/medical concerns Walk 50 feet with 2 turns activity did not occur: Safety/medical concerns Walk 150 feet activity did not occur: Safety/medical concerns Walk 10 feet on uneven surfaces activity did not occur: Safety/medical concerns  Function - Comprehension Comprehension: Auditory Comprehension assist level: Follows basic conversation/direction with no assist  Function - Expression Expression: Verbal Expression assist level: Expresses basic needs/ideas: With no assist  Function - Social Interaction Social Interaction assist level: Interacts appropriately with others with medication or extra time (anti-anxiety, antidepressant).  Function - Problem Solving Problem solving assist level: Solves complex problems: With extra time  Function - Memory Memory assist level: Complete Independence: No helper Patient normally able to recall (first 3 days only): Location of own room, Staff names and faces, That he or she is in a hospital Medical Problem List and Plan: 1. Functional deficits secondary to C6 ASIA C incomplete tetraplegia, spinal cord injury due to fall with  C5-C6 fracture and neurogenic bowel and bladder 2. DVT Prophylaxis/Anticoagulation: Pharmaceutical: Lovenox to contiue  -dopplers negative 3. Pain Management: Continue Lyrica BID for neuropathic pain. 4. Mood: Team to provide ego support.  .    -consider antidepressant, neuropsych eval completed 5. Neuropsych: This patient is capable of making decisions on his own behalf. 6. Skin/Wound Care: Routine pressure relief measures.  7. Fluids/Electrolytes/Nutrition: 100% meals  , enc fluids between meals 8. ZOX:WRUEAVWU negatie. continue to encourage IS. Blood cultures if patient spikes again.  -recent cxr normal  -ucx + for klebsiella oxytoca--continue septra for 7 total days  -afebrile over last 72 hours 9. CV: abd binder/compression stockings to maintain sitting/upright bp  -does not require pharmaceutical rx at present  -can use coban wrap,compression to finger to decreased pinching from ring 10. Acutre renal failure---volume depletion---responded to ivf   -dc IVF---push po  -last BMET 8/18 nl  Except mildly elevated BUN 11. Neurogenic bladder---continue foley through weekend- voiding trial on Monday 12. Neurogenic bowel: working on qd bowel program 13.  Low BP due to reduced autonomic tone related to quadriplegia, would rec ted hose, narcotics may contribute, will try tramadol rather than oxy for pain, push fluids LOS (Days) 5 A FACE TO FACE EVALUATION WAS PERFORMED  Leinaala Catanese E 11/25/2014 9:50 AM

## 2014-11-25 NOTE — Progress Notes (Addendum)
Physical Therapy Session Note  Patient Details  Name: Carl [name] MRN: [id number] Date of Birth: April 26, 1950  Today's Date: 11/25/2014 PT Individual Time: 1191-4782 PT Individual Time Calculation (min): 30 min   Short Term Goals: Week 1:  PT Short Term Goal 1 (Week 1): Pt will be able to verbalize need for pressure relief when OOB in w/c every 30 min PT Short Term Goal 2 (Week 1): Pt will be able to roll with max A of 1 using bed rails PRN PT Short Term Goal 3 (Week 1): Pt will be able to tolerate EOB x 10 min with max A  during functional task PT Short Term Goal 4 (Week 1): Pt will be able to tolerate OOB x 2 hours a day  Skilled Therapeutic Interventions/Progress Updates:    Pt received in bed with wife present - ace wraps in place and foley in place - agreeable to PT. PT educates pt and wife that ace wraps are to be removed overnight so that skin can rest. PT removes ace wraps, obtains large size, medium length, thigh high TEDs and places them on pt, educating pt and family to ensure there are no wrinkles in TEDs. Therapeutic Exercise - gastroc stretch bilaterally x 1 minute each - PT demonstrates and educates wife how she can do this in between therapy time, if she wishes, focus on hand placement and proper body mechanics for wife. PT instructs pt in R LE AROM: ankle pumps (partial ROM), AAROM R SAQ, isometric gluteal contractions bilaterally: x 10 reps each. Therapeutic Activity - see below for details. Pt rolled from supine to L side lie with pillow support to reduce pressure areas, tray table in reach with water so pt can increase fluid intake, per MD orders. RN notified that pt's abdominal binders are ill-fitting (one too large, one too small) and requests she order pt the appropriate size binder. All needs in reach - continue per PT POC.   Therapy Documentation Precautions:  Precautions Precautions: Cervical, Fall Precaution Comments: Using BLE ACE wraps for BP control at this time.  Monitor this Required Braces or Orthoses: Cervical Brace Cervical Brace: Hard collar, At all times Restrictions Weight Bearing Restrictions: No Pain: Pain Assessment Pain Assessment: 0-10 Pain Score: 2  Pain Type: Acute pain Pain Location: Neck Pain Orientation: Left Pain Descriptors / Indicators: Aching Pain Onset: On-going Pain Intervention(s): Rest;Repositioned Multiple Pain Sites: No   Function:  Toileting Toileting       Bed Mobility Roll left and right activity   Assist level: 2 helpers  Sit to lying activity      Lying to sitting activity      Mobility details Bed mobility details: Manual facilitation for placement;Visual cues/gestures for precautions/safety;Verbal cues for techniques;Manual facilitation for weight shifting   Transfers Sit to stand transfer        Chair/bed transfer               Personal assistant Ambulation          Walk 10 feet activity      Walk 50 feet with 2 turns activity      Walk 150 feet activity      Walk 10 feet on uneven surfaces activity      Stairs          Walk up/down 1 step activity        Walk  up/down 4 steps activity      Walk up/down 12 steps activity      Pick up small objects from floor      Wheelchair          Wheel 50 feet with 2 turns activity      Wheel 150 feet activity       Cognition Comprehension Comprehension assist level: Follows complex conversation/direction with extra time/assistive device  Expression Expression assist level: Expresses complex 90% of the time/cues < 10% of the time  Social Interaction Social Interaction assist level: Interacts appropriately with others with medication or extra time (anti-anxiety, antidepressant).  Problem Solving Problem solving assist level: Solves complex problems: With extra time  Memory Memory assist level: Complete Independence: No helper    Therapy/Group: Individual Therapy  Carl [name]  M 11/25/2014, 10:36 AM

## 2014-11-25 NOTE — Progress Notes (Signed)
Social Work  Social Work Assessment and Plan  Patient Details  Name: Carl [name] MRN: [id number] Date of Birth: 1950/06/22  Today's Date: 11/23/2014  Problem List:  Patient Active Problem List   Diagnosis Date Noted  . Acute renal failure syndrome   . C6 spinal cord injury 11/20/2014  . Neurogenic bowel 11/20/2014  . Neurogenic bladder 11/20/2014  . Fever of unknown origin   . Quadriplegia 11/14/2014  . C5 vertebral fracture 11/09/2014   Past Medical History:  Past Medical History  Diagnosis Date  . Bradycardia   . Chronic abdominal pain   . Hesitancy of micturition    Past Surgical History:  Past Surgical History  Procedure Laterality Date  . Anterior cervical decomp/discectomy fusion N/A 11/14/2014    Procedure: Cervical Five-Cervical Six  Anterior Cervical Decompression/Diskectomy/Fusion;  Surgeon: Maeola Harman, MD;  Location: [facility];  Service: Neurosurgery;  Laterality: N/A;   Social History:  has no tobacco, alcohol, and drug history on file.  Family / Support Systems Marital Status: Married Patient Roles: Spouse, Parent, Other (Comment) Field seismologist) Spouse/Significant Other: wife, Elease Hashimoto Melchor @ (C) [phone] Children: Daughters, [place] Ana Mobius ([place]) and [place] Josten [place]) - both working f/t Other Supports: pt's sister, Virgilio Frees Agricultural engineer) Anticipated Caregiver: wife Ability/Limitations of Caregiver: Wife is retired and can assist. Medical laboratory scientific officer: 24/7 Family Dynamics: Pt describes all family as very supportive.  Wife in good health and can provide physical assistance.  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 Legal Hisotry/Current Legal Issues: None Guardian/Conservator: None - per MD, pt capable of making decisions on his own behalf   Abuse/Neglect Physical Abuse: Denies Verbal Abuse: Denies Sexual Abuse: Denies Exploitation of patient/patient's resources: Denies Self-Neglect: Denies  Emotional Status Pt's affect, behavior adn adjustment status: Pt very soft spoken and not very talkative.  Completes interview without difficulty, however, offers no spontaneous conversation.  He denies any s/s of depression or anxiety.  Reports that he is "hopeful" he will make more gain than expected.  Neuropsychology consult already completed and no depressinve symptoms endorced. Recent Psychosocial Issues: None Pyschiatric History: None Substance Abuse History: None  Patient / Family Perceptions, Expectations & Goals Pt/Family understanding of illness & functional limitations: Pt and wife with good understanding of his injury and resulting limitations.  Appear to have good understanding of the longer term disability expected and goals set for w/c level. Premorbid pt/family roles/activities: Pt independent and very active.  Working f/t as a Education officer, environmental. Anticipated changes in roles/activities/participation: Pt will require 24/7 assistance and wife to assume primary caregiver role. Pt/family expectations/goals: "I just want to get as much (function) back as I can."  [employer]: None Premorbid Home Care/DME Agencies: None Transportation available at discharge: yes Resource referrals recommended: Neuropsychology  Discharge Planning Living Arrangements: Spouse/significant other Support Systems: Spouse/significant other, Children, Other relatives, Friends/neighbors, Psychologist, clinical community Type of Residence: Private residence Insurance Resources: Media planner (specify) (Fed Acupuncturist) Architect: Employment, Other  (Comment) ([employer] retirement) Surveyor, quantity Screen Referred: No Living Expenses: Own Money Management: Patient Does the patient have any problems obtaining your medications?: No Home Management: shared betw pt and wife Patient/Family  Preliminary Plans: Pt to d/c home with wife as primary caregiver. Social Work Anticipated Follow Up Needs: HH/OP Expected length of stay: 3-4 weeks  Clinical Impression Unfortunate gentleman here following a fall and now with SCI.  Pt and wife are hopeful about returning function, however, have a good understanding of anticipated care needs upon d/c.  Pt does not endorse any s/s of depression or anxiety - will monitor.  Wife very involved and supportive.  Will follow for support and d/c planning needs.  Onesty Clair 11/23/2014, 5:26 PM

## 2014-11-26 ENCOUNTER — Inpatient Hospital Stay (HOSPITAL_COMMUNITY): Payer: Federal, State, Local not specified - PPO | Admitting: Occupational Therapy

## 2014-11-26 ENCOUNTER — Inpatient Hospital Stay (HOSPITAL_COMMUNITY): Payer: Federal, State, Local not specified - PPO

## 2014-11-26 NOTE — Progress Notes (Signed)
Physical Therapy Session Note  Patient Details  Name: Carl [name] MRN: [id number] Date of Birth: February 05, 1951  Today's Date: 11/26/2014 PT Individual Time: 1415-1445 PT Individual Time Calculation (min): 30 min   Short Term Goals: Week 1:  PT Short Term Goal 1 (Week 1): Pt will be able to verbalize need for pressure relief when OOB in w/c every 30 min PT Short Term Goal 2 (Week 1): Pt will be able to roll with max A of 1 using bed rails PRN PT Short Term Goal 3 (Week 1): Pt will be able to tolerate EOB x 10 min with max A  during functional task PT Short Term Goal 4 (Week 1): Pt will be able to tolerate OOB x 2 hours a day  Skilled Therapeutic Interventions/Progress Updates:   Session focused on addressing education on OOB tolerance and positioning for BP management (BP lower again this PM while OOB), functional transfer back to bed using slideboard with +2 assist with focus on anterior weightshift and functional support using BUE's by patient with cues for technique, sitting balance EOB to initiate forward trunk movement x 10 reps to aid with transfers, returned to supine with +2 assist and removed ACE wraps and donned PRAFO boots. Encouraged pt to get up again at dinned time to increase upright tolerance and reminded him that all ACE wraps, binders, and Tedhose should be removed by nursing staff nightly.   Therapy Documentation Precautions:  Precautions Precautions: Cervical, Fall Precaution Comments: Using BLE ACE wraps for BP control at this time. Monitor this Required Braces or Orthoses: Cervical Brace Cervical Brace: Hard collar, At all times Restrictions Weight Bearing Restrictions: No Pain: Pain Assessment Pain Score: 0-No pain   Function:  Toileting Toileting       Bed Mobility Roll left and right activity   Assist level: 2 helpers  Sit to lying activity      Lying to sitting activity   Assist level: 2 helpers  Mobility details Bed mobility details: Manual  facilitation for weight shifting;Manual facilitation for placement;Verbal cues for precautions/safety;Verbal cues for techniques   Transfers Sit to stand transfer        Chair/bed transfer   Chair/bed transfer method: Lateral scoot Chair/bed transfer assist level: 2 helpers Chair/bed transfer assistive device: Sliding board;Armrests;Orthosis   Chair/bed transfer details: Verbal cues for precautions/safety;Verbal cues for technique;Verbal cues for sequencing;Manual facilitation for weight shifting;Manual facilitation for placement   Toilet transfer        Car transfer          Locomotion Ambulation          Walk 10 feet activity      Walk 50 feet with 2 turns activity      Walk 150 feet activity      Walk 10 feet on uneven surfaces activity      Stairs          Walk up/down 1 step activity        Walk up/down 4 steps activity      Walk up/down 12 steps activity      Pick up small objects from floor      Wheelchair       Assist Level: Dependent (Pt equals 0%)  Wheel 50 feet with 2 turns activity      Wheel 150 feet activity       Cognition Comprehension Comprehension assist level: Follows complex conversation/direction with no assist  Expression Expression assist level: Expresses complex 90% of the time/cues <  10% of the time  Social Interaction Social Interaction assist level: Interacts appropriately with others - No medications needed.  Problem Solving Problem solving assist level: Solves complex problems: Recognizes & self-corrects  Memory Memory assist level: More than reasonable amount of time    Therapy/Group: Individual Therapy  Karolee Stamps Darrol Poke, PT, DPT  11/26/2014, 3:07 PM

## 2014-11-26 NOTE — Progress Notes (Signed)
Orthopedic Tech Progress Note Patient Details:  Carl [name] 1950-12-22 [id number]  Ortho Devices Type of Ortho Device: Abdominal binder Ortho Device/Splint Location: abdomen Ortho Device/Splint Interventions: Freeman Caldron, Jamarea Selner 11/26/2014, 11:15 AM

## 2014-11-26 NOTE — Progress Notes (Signed)
Occupational Therapy Session Note  Patient Details  Name: Carl [name] MRN: [id number] Date of Birth: 02-06-1951  Today's Date: 11/26/2014 OT Individual Time:08:00-09:00 and 1300-1345   OT Total Time: 60 min and 45 min   Short Term Goals: Week 1:  OT Short Term Goal 1 (Week 1): Pt will bathe in supine with mod A OT Short Term Goal 2 (Week 1): Pt will direct care for bed mobility with min questioning cues OT Short Term Goal 3 (Week 1): Pt will self feed 50% of meal with min A  Skilled Therapeutic Interventions/Progress Updates:    Session One: Pt seen for OT therapy session focusing on bed mobility and ADL re-training. Pt in supine upon arrival, agreeable to tx session. Pt excited regarding slight movement now being noted in L LE, able to ADDuct  L LE slightly when moving R LE at same time. He completed bathing and dressing task in supine, see below for required amount of assistance. Focus on pt directing care for bed mobility. With increased time, he was able to open deodorant container with assist provided to apply deoderantfor needed pressure. Pt able to manipulate wash cloth in hand to wash face and torso. Attempted use of LH sponge, however, due to not having enough pressure through sponge due to decreased hand/grip strength, assist provided for thoroughness. Upon dressing in supine, pt reported he felt as if he had BM, brief checked, and pt had small amount of stool. Total A provided for hygiene and clothing management. Pt able to use UE  hooking arm on bed rail to assist with bed mobility. Pt educated regarding OT goals, POC, and d/c planning.  Pt left in supine at end of session, all needs in reach.   Session Two: Pt seen for OT session focusing on ADL re-training. Pt sitting up in w/c upon arrival, pt eating lunch with NT present. OT assisted with remainder of feeding. Pt able to self feed using R UE with universal cuff donned with spoon insert. Pt able to manipulate dish with L hand to  assist with feeding task. Pt then completed grooming task set-up at the sink. Universal cuff donned, and with increased time pt able to put toothpaste on toothbrush. Increased time required due to pt's decreased finger/ hand strength need for functional fine motor task. Assist required to bring pt's back off w/c due to decreased core strength/stability. Pt voiced desire to complete shaving task as his beard is uncomfortable with cervical hard collar. He required total A for shaving task, able to self direct care for use of electric razor. Pt voiced need for boosting to be performed at end of session, able to direct care for boosting technique and setting timer for next boosting to occur. Pt demonstrates good safety awareness and adherence to boosting schedule. Pt left in w/c at end of session, all needs in reach.   Therapy Documentation Precautions:  Precautions Precautions: Cervical, Fall Precaution Comments: Using BLE ACE wraps for BP control at this time. Monitor this Required Braces or Orthoses: Cervical Brace Cervical Brace: Hard collar, At all times Restrictions Weight Bearing Restrictions: No Pain: Pain Assessment Pain Score: 0-No pain  Function:   Eating Eating   Eating Assist Level: Set up assist for;More than reasonable amount of time;Help managing cup/glass   Eating Set Up Assist For: Opening containers;Applying device (includes dentures)       Grooming Oral Care,Brush Teeth, Clean Dentures Activity:      Assist Level: Assistive device Assistive Device Comment: Set-up  with universal cuff    Wash, Rinse, Dry Face Activity   Assist Level: Set up   Set up : To obtain items;To open containers  Wash, Rinse, Dry Hands Activity          Brush, Comb Hair Activity        Shave Activity   Assist Level: Helper performed activity      Apply Makeup Activity                                                             Bathing Bathing position   Position: Bed  Bathing  parts Body parts bathed by patient: Right arm;Left arm;Chest;Abdomen Body parts bathed by helper: Front perineal area;Buttocks;Right upper leg;Left upper leg;Right lower leg;Left lower leg  Bathing assist Assist Level: 2 helpers;Assistive device Assistive Device Comment: LH sponge     Upper Body Dressing/Undressing Upper body dressing   What is the patient wearing?: Pull over shirt/dress     Pull over shirt/dress - Perfomed by patient: Put head through opening Pull over shirt/dress - Perfomed by helper: Thread/unthread right sleeve;Thread/unthread left sleeve;Pull shirt over trunk        Upper body assist Assist Level: 2 helpers       Lower Body Dressing/Undressing Lower body dressing   What is the patient wearing?: Pants       Pants- Performed by helper: Thread/unthread right pants leg;Thread/unthread left pants leg;Pull pants up/down   Non-skid slipper socks- Performed by helper: Don/doff right sock;Don/doff left sock                  Lower body assist               Bed Mobility Roll left and right activity   Assist level: 2 helpers  Sit to lying activity      Lying to sitting activity   Assist level: 2 helpers  Mobility details Bed mobility details: Manual facilitation for weight shifting;Manual facilitation for placement;Verbal cues for precautions/safety;Verbal cues for techniques   Transfers Sit to stand transfer        Chair/bed transfer   Chair/bed transfer method: Lateral scoot Chair/bed transfer assist level: 2 helpers Chair/bed transfer assistive device: Sliding board;Armrests;Orthosis   Chair/bed transfer details: Verbal cues for precautions/safety;Verbal cues for technique;Verbal cues for sequencing;Manual facilitation for weight shifting;Manual facilitation for placement  Toilet transfer                Tub/shower transfer             Cognition Comprehension Comprehension assist level: Follows complex conversation/direction  with no assist  Expression Expression assist level: Expresses complex 90% of the time/cues < 10% of the time  Social Interaction Social Interaction assist level: Interacts appropriately with others - No medications needed.  Problem Solving Problem solving assist level: Solves complex problems: Recognizes & self-corrects  Memory Memory assist level: More than reasonable amount of time    Therapy/Group: Individual Therapy  Lewis, Ammy Lienhard C 11/26/2014, 2:08 PM

## 2014-11-26 NOTE — Progress Notes (Signed)
Physical Therapy Session Note  Patient Details  Name: Carl [name] MRN: [id number] Date of Birth: 1950/12/12  Today's Date: 11/26/2014 PT Individual Time: 1000-1100 PT Individual Time Calculation (min): 60 min   Short Term Goals: Week 1:  PT Short Term Goal 1 (Week 1): Pt will be able to verbalize need for pressure relief when OOB in w/c every 30 min PT Short Term Goal 2 (Week 1): Pt will be able to roll with max A of 1 using bed rails PRN PT Short Term Goal 3 (Week 1): Pt will be able to tolerate EOB x 10 min with max A  during functional task PT Short Term Goal 4 (Week 1): Pt will be able to tolerate OOB x 2 hours a day  Skilled Therapeutic Interventions/Progress Updates:    Therapist donned BLE Tedhose and ACE wraps for BP control. Notified and discussed with RN about getting appropriately sized abdominal binder for OOB. Pt more limited with upright tolerance this session due to not getting OOB today and required increased time for BP to normalize EOB before safe to perform transfer. Focused on addressing functional bed mobility (see below for details), neuro re-ed for sitting balance and motor activation in RLE while seated EOB to increase upright tolerance, functional transfer training using slideboard with focus on anterior weightshift and pt maintaining balance during transfer to get into w/c, seated in w/c performed UE motor and coordination task to toss, catch, and tap ball back and forth increasing upright position to challenge pt's tolerance. End of session left up in w/c with all needs in reach and timer set for pressure relief.   Therapy Documentation Precautions:  Precautions Precautions: Cervical, Fall Precaution Comments: Using BLE ACE wraps for BP control at this time. Monitor this Required Braces or Orthoses: Cervical Brace Cervical Brace: Hard collar, At all times Restrictions Weight Bearing Restrictions: No   Vital Signs: Therapy Vitals Pulse Rate: (!) 43 BP: (!)  120/60 mmHg Patient Position (if appropriate): Sitting  See DocFlowsheets for other values during session.  Lowest = 87/47 mHg Pain: Premedicated for pain in shoulders.  Function:  Toileting Toileting       Bed Mobility Roll left and right activity   Assist level: 2 helpers  Sit to lying activity      Lying to sitting activity   Assist level: 2 helpers  Mobility details Bed mobility details: Manual facilitation for weight shifting;Manual facilitation for placement;Verbal cues for precautions/safety;Verbal cues for techniques   Transfers Sit to stand transfer        Chair/bed transfer   Chair/bed transfer method: Lateral scoot Chair/bed transfer assist level: 2 helpers Chair/bed transfer assistive device: Sliding board;Armrests;Orthosis   Chair/bed transfer details: Verbal cues for precautions/safety;Verbal cues for technique;Verbal cues for sequencing;Manual facilitation for weight shifting;Manual facilitation for placement   Toilet transfer        Car transfer          Locomotion Ambulation          Walk 10 feet activity      Walk 50 feet with 2 turns activity      Walk 150 feet activity      Walk 10 feet on uneven surfaces activity      Stairs          Walk up/down 1 step activity        Walk up/down 4 steps activity      Walk up/down 12 steps activity      Pick up small  objects from floor      Wheelchair       Assist Level: Dependent (Pt equals 0%)  Wheel 50 feet with 2 turns activity      Wheel 150 feet activity       Cognition Comprehension Comprehension assist level: Follows complex conversation/direction with no assist  Expression Expression assist level: Expresses complex 90% of the time/cues < 10% of the time  Social Interaction Social Interaction assist level: Interacts appropriately with others - No medications needed.  Problem Solving Problem solving assist level: Solves complex problems: Recognizes & self-corrects  Memory  Memory assist level: More than reasonable amount of time    Therapy/Group: Individual Therapy  Karolee Stamps Darrol Poke, PT, DPT  11/26/2014, 12:05 PM

## 2014-11-26 NOTE — Progress Notes (Signed)
[facility] PHYSICAL MEDICINE & REHABILITATION     PROGRESS NOTE    Subjective/Complaints: Pt feels better after being washed up this am. No issues overnite  ROS: Pt denies  , rash/itching, headache, blurred or double vision, nausea, vomiting, abdominal pain, diarrhea, chest pain, shortness of breath, palpitations, dysuria, dizziness,  back pain, bleeding, anxiety, or depression + neck pain controlled  Objective: Vital Signs: Blood pressure 88/50, pulse 41, temperature 99.2 F (37.3 C), temperature source Oral, resp. rate 18, height  (1.727 m), weight 87.59 kg (193 lb 1.6 oz), SpO2 92 %. No results found. No results for input(s): WBC, HGB, HCT, PLT in the last 72 hours. No results for input(s): NA, K, CL, GLUCOSE, BUN, CREATININE, CALCIUM in the last 72 hours.  Invalid input(s): CO CBG (last 3)  No results for input(s): GLUCAP in the last 72 hours.  Wt Readings from Last 3 Encounters:  11/22/14 87.59 kg (193 lb 1.6 oz)  11/16/14 90.719 kg (200 lb)    Physical Exam:  Constitutional: He is oriented to person, place, and time. He appears well-developed and well-nourished.  HENT:  Head: Normocephalic and atraumatic.  Healing abrasion on face/right eyelid  Eyes: Conjunctivae are normal. Pupils are equal, round, and reactive to light.  Neck:  C Collar.  Cardiovascular: Normal rate and regular rhythm.  Respiratory: Effort normal. No respiratory distress. He has no wheezes.  GI: Soft. He exhibits distension. Bowel sounds are decreased. There is tenderness.  Genitourinary:  Foley with clear urine  Musculoskeletal: He exhibits tenderness. He exhibits no edema.  Neurological: He is alert and oriented to person, place, and time. No cranial nerve deficit. Coordination normal.  Patient has normal sensation proximal to C6 dermatome. Patient has reduced sensation to pinprick but is able to identify the area touched At bilateral C6 through sacral levels Patient has decreased tone  in bilateral Lower extremities 4/5 bilateral deltoids, biceps, 4- triceps, 4 wrist extensors, trace right HI, 1+ to 2- left HI, right HF, KE and ADF/APF are 1/5. 0/5 left hip flexors and knee extensors ankle dorsiflexors and plantar flexors  Skin: Skin is warm and dry.  Cervical incision clean and intact   Assessment/Plan: 1. 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 Physiatrist is providing close team supervision and 24 hour management of active medical problems listed below. Physiatrist and rehab team continue to assess barriers to discharge/monitor patient progress toward functional and medical goals.  FIM: Function - Bathing Position: Bed Body parts bathed by patient: Right arm, Left arm, Chest, Abdomen, Right upper leg, Left upper leg Body parts bathed by helper: Front perineal area, Buttocks, Right lower leg, Left lower leg, Back Assist Level: 2 helpers, Assistive device Assistive Device Comment: LH sponge  Function- Upper Body Dressing/Undressing What is the patient wearing?: Pull over shirt/dress Pull over shirt/dress - Perfomed by helper: Thread/unthread right sleeve, Thread/unthread left sleeve, Put head through opening, Pull shirt over trunk Assist Level: 2 helpers Function - Lower Body Dressing/Undressing What is the patient wearing?: Pants Position: Bed Pants- Performed by helper: Thread/unthread right pants leg, Thread/unthread left pants leg, Pull pants up/down Non-skid slipper socks- Performed by helper: Don/doff right sock, Don/doff left sock Socks - Performed by helper: Don/doff right sock, Don/doff left sock Shoes - Performed by helper: Don/doff right shoe, Don/doff left shoe, Fasten left, Fasten right Assist Level: 2 Helpers  Function - Toileting Toileting activity did not  occur: No continent bowel/bladder event Assist level: Two helpers  Function -  Archivist transfer activity did not occur: Safety/medical concerns  Function - Chair/bed transfer Chair/bed transfer method: Lateral scoot Chair/bed transfer assist level: 2 helpers Chair/bed transfer assistive device: Sliding board, Armrests, Orthosis Chair/bed transfer details: Verbal cues for precautions/safety, Verbal cues for technique, Verbal cues for sequencing, Manual facilitation for weight shifting, Manual facilitation for placement  Function - Locomotion: Wheelchair Will patient use wheelchair at discharge?: Yes Type: Manual Assist Level: Dependent (Pt equals 0%) (Tilt in space w/c) Assist Level: Dependent (Pt equals 0%) Assist Level: Dependent (Pt equals 0%) Function - Locomotion: Ambulation Ambulation activity did not occur: Safety/medical concerns Walk 10 feet activity did not occur: Safety/medical concerns Walk 50 feet with 2 turns activity did not occur: Safety/medical concerns Walk 150 feet activity did not occur: Safety/medical concerns Walk 10 feet on uneven surfaces activity did not occur: Safety/medical concerns  Function - Comprehension Comprehension: Auditory Comprehension assist level: Follows complex conversation/direction with no assist  Function - Expression Expression: Verbal Expression assist level: Expresses complex 90% of the time/cues < 10% of the time  Function - Social Interaction Social Interaction assist level: Interacts appropriately with others - No medications needed.  Function - Problem Solving Problem solving assist level: Solves complex problems: Recognizes & self-corrects  Function - Memory Memory assist level: More than reasonable amount of time Patient normally able to recall (first 3 days only): Location of own room, Staff names and faces, That he or she is in a hospital, Current season Medical Problem List and Plan: 1. Functional deficits secondary to C6 ASIA C incomplete tetraplegia, spinal cord injury due to fall  with C5-C6 fracture and neurogenic bowel and bladder, discussed  timeframe of recovery  2. DVT Prophylaxis/Anticoagulation: Pharmaceutical: Lovenox to continue  -dopplers negative 3. Pain Management: Continue Lyrica BID for neuropathic pain.tramdol for mod and oxy for severe pain 4. Mood: Team to provide ego support.  .    -consider antidepressant, neuropsych eval completed 5. Neuropsych: This patient is capable of making decisions on his own behalf. 6. Skin/Wound Care: Routine pressure relief measures.  7. Fluids/Electrolytes/Nutrition: 100% meals  , enc fluids between meals 8. QMV:HQIONGEX negatie. continue to encourage IS. Blood cultures if patient spikes again.  -recent cxr normal  -ucx + for klebsiella oxytoca--continue septra for 7 total days, d/c date 8/25  -afebrile over last 72 hours 9. CV: abd binder/compression stockings to maintain sitting/upright bp, check orthostatic vitals  -does not require pharmaceutical rx at present  -can use coban wrap,compression to finger to decreased pinching from ring 10. Acutre renal failure---volume depletion---responded to ivf   -dc IVF---push po  -last BMET 8/18 nl  Except mildly elevated BUN 11. Neurogenic bladder---continue foley through weekend- voiding trial on Monday 12. Neurogenic bowel: working on qd bowel program, last stool 8/19, order daily dulc supp 13.  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

## 2014-11-27 ENCOUNTER — Inpatient Hospital Stay (HOSPITAL_COMMUNITY): Payer: Federal, State, Local not specified - PPO | Admitting: Occupational Therapy

## 2014-11-27 ENCOUNTER — Inpatient Hospital Stay (HOSPITAL_COMMUNITY): Payer: Federal, State, Local not specified - PPO

## 2014-11-27 ENCOUNTER — Inpatient Hospital Stay (HOSPITAL_COMMUNITY): Payer: Federal, State, Local not specified - PPO | Admitting: *Deleted

## 2014-11-27 ENCOUNTER — Inpatient Hospital Stay (HOSPITAL_COMMUNITY): Payer: Federal, State, Local not specified - PPO | Admitting: Physical Therapy

## 2014-11-27 LAB — CBC
HCT: 34.4 % — ABNORMAL LOW (ref 39.0–52.0)
Hemoglobin: 11.6 g/dL — ABNORMAL LOW (ref 13.0–17.0)
MCH: 29.7 pg (ref 26.0–34.0)
MCHC: 33.7 g/dL (ref 30.0–36.0)
MCV: 88.2 fL (ref 78.0–100.0)
PLATELETS: 397 10*3/uL (ref 150–400)
RBC: 3.9 MIL/uL — AB (ref 4.22–5.81)
RDW: 13 % (ref 11.5–15.5)
WBC: 11.1 10*3/uL — AB (ref 4.0–10.5)

## 2014-11-27 LAB — BASIC METABOLIC PANEL
Anion gap: 8 (ref 5–15)
BUN: 19 mg/dL (ref 6–20)
CALCIUM: 8.7 mg/dL — AB (ref 8.9–10.3)
CO2: 28 mmol/L (ref 22–32)
CREATININE: 1.03 mg/dL (ref 0.61–1.24)
Chloride: 98 mmol/L — ABNORMAL LOW (ref 101–111)
Glucose, Bld: 108 mg/dL — ABNORMAL HIGH (ref 65–99)
Potassium: 4.5 mmol/L (ref 3.5–5.1)
SODIUM: 134 mmol/L — AB (ref 135–145)

## 2014-11-27 NOTE — Progress Notes (Signed)
Physical Therapy Weekly Progress Note  Patient Details  Name: Carl [name] MRN: [id number] Date of Birth: January 12, 1951  Beginning of progress report period: November 21, 2014 End of progress report period: November 28, 2014  Today's Date: 11/28/2014 PT Individual Time: 6734-1937 PT Individual Time Calculation (min): 32 min   Patient has made steady progress and has met 4 of 4 short term goals.  Pt is demonstrating some motor return in bilat LE but continues to require max-total A for bed mobility, transfers with slideboard and continues to be dependent for w/c mobility in tilt in space for BP management and pressure relief.  Due to BP issues pt has not been able to tolerate >4 hours in w/c consistently.    Patient continues to demonstrate the following deficits: tetraparesis, impaired activity tolerance/endurance and BP issues, impaired postural control, balance and therefore will continue to benefit from skilled PT intervention to enhance overall performance with activity tolerance, balance, postural control and functional use of  right upper extremity, right lower extremity, left upper extremity and left lower extremity.  Patient progressing toward long term goals..  Continue plan of care.  PT Short Term Goals Week 1:  PT Short Term Goal 1 (Week 1): Pt will be able to verbalize need for pressure relief when OOB in w/c every 30 min PT Short Term Goal 1 - Progress (Week 1): Met PT Short Term Goal 2 (Week 1): Pt will be able to roll with max A of 1 using bed rails PRN PT Short Term Goal 2 - Progress (Week 1): Met PT Short Term Goal 3 (Week 1): Pt will be able to tolerate EOB x 10 min with max A  during functional task PT Short Term Goal 3 - Progress (Week 1): Met PT Short Term Goal 4 (Week 1): Pt will be able to tolerate OOB x 2 hours a day PT Short Term Goal 4 - Progress (Week 1): Met Week 2:  PT Short Term Goal 1 (Week 2): Pt will perform bed mobility rolling and supine <> sit with use of leg  loops and max A of one person PT Short Term Goal 2 (Week 2): Pt will perform slideboard transfer bed <> chair on level surface with max A of one person and use of leg loops PT Short Term Goal 3 (Week 2): Pt will trial and perform w/c mobility in manual w/c x 50' with mod A  PT Short Term Goal 4 (Week 2): Pt will tolerate sitting up OOB x 4-6 hours and be independent with requesting pressure relief every 30 minutes PT Short Term Goal 5 (Week 2): Pt will tolerate standing in frame x 5 minutes for WB and core strengthening during dynamic UE activity  Skilled Therapeutic Interventions/Progress Updates:   Pt received in w/c; requesting boost for pressure relief.  Pt agreeable to return to bed to practice use of leg loops for bed mobility.  Pt transferred w/c > bed with slideboard as below with pt demonstrating improved initiation and use of UE and LE extension to assist with transfer.  Pt transitioned to supine as below; pt still requiring cues to verbalize need to doff all BP management garments once in supine.  Donned leg loops and demonstrated use to pt.  Practiced use of leg loops for rolling L and R as below; pt left in bed with leg loops donned to allow OT to continue training and trial of leg loops for mobility.     Therapy Documentation Precautions:  Precautions Precautions:  Cervical, Fall Precaution Comments: Using BLE ACE wraps for BP control at this time. Monitor this Required Braces or Orthoses: Cervical Brace Cervical Brace: Hard collar, At all times Restrictions Weight Bearing Restrictions: No Vital Signs: Therapy Vitals Temp: 97.9 F (36.6 C) Temp Source: Oral Pulse Rate: (!) 42 Resp: 17 BP: (!) 96/48 mmHg Patient Position (if appropriate): Lying Oxygen Therapy SpO2: 96 % O2 Device: Not Delivered Pain:  No c/o pain  Function:  Bed Mobility Roll left and right activity   Assist level: Max assist (Pt 25 - 49%) Roll left and right assistive device: Bedrails;Other (leg  loops)  Sit to lying activity   Assist level: 2 helpers    Lying to sitting activity   Assist level: 2 helpers Lying to sitting assistive device: HOB elevated  Mobility details Bed mobility details: Tactile cues for initiation;Tactile cues for placement;Tactile cues for sequencing;Visual cues/gestures for sequencing;Verbal cues for techniques;Verbal cues for sequencing;Manual facilitation for weight shifting;Manual facilitation for placement   Transfers Sit to stand transfer        Chair/bed transfer   Chair/bed transfer method: Lateral scoot Chair/bed transfer assist level: 2 helpers Chair/bed transfer assistive device: Sliding board;Armrests;Orthosis   Chair/bed transfer details: Verbal cues for sequencing;Verbal cues for technique;Verbal cues for precautions/safety;Manual facilitation for weight shifting;Tactile cues for posture   Toilet transfer                Car transfer           Cognition Comprehension Comprehension assist level: Follows complex conversation/direction with no assist  Expression Expression assist level: Expresses complex ideas: With no assist  Social Interaction Social Interaction assist level: Interacts appropriately with others with medication or extra time (anti-anxiety, antidepressant).  Problem Solving Problem solving assist level: Solves complex problems: Recognizes & self-corrects  Memory Memory assist level: More than reasonable amount of time    Therapy/Group: Individual Therapy  Raylene Everts [facility] 11/28/2014, 3:42 PM

## 2014-11-27 NOTE — Progress Notes (Signed)
Physical Therapy Session Note  Patient Details  Name: Carl [name] MRN: [id number] Date of Birth: 09/20/1950  Today's Date: 11/27/2014 PT Individual Time: 1430-1530 PT Individual Time Calculation (min): 60 min   Short Term Goals: Week 1:  PT Short Term Goal 1 (Week 1): Pt will be able to verbalize need for pressure relief when OOB in w/c every 30 min PT Short Term Goal 2 (Week 1): Pt will be able to roll with max A of 1 using bed rails PRN PT Short Term Goal 3 (Week 1): Pt will be able to tolerate EOB x 10 min with max A  during functional task PT Short Term Goal 4 (Week 1): Pt will be able to tolerate OOB x 2 hours a day  Skilled Therapeutic Interventions/Progress Updates:   Pt received in w/c and requesting pressure relief in w/c; pt placed in fully tilted position x 2 minutes and returned to sitting. Re-emphasized importance of proper use of ace wraps, THT and abdominal binder for BP management with donning prior to transitioning to upright and doffing when returning to supine.  Pt verbalized understanding.  Assessed BP in sitting prior to attempting to stand in frame: 93/60, HR: 40 bpm.  Pt agreeable to standing.  Performed sit > stand and prolonged standing in frame (total A) x 2-3 minutes at a time with BP: 112/78, HR:97 with pt reporting min-mod lightheadedness.  During standing performed active shoulder and scapular exercises and maintaining trunk control transitioning from 2 >> 1 UE support.  Returned to room and pt performed active LE exercises/activation while awaiting linens to be changed on bed.  Performed transfer back to bed and to supine as below.  Pt left in bed with SR x 3 , bed alarm on and all items within reach.  Therapy Documentation Precautions:  Precautions Precautions: Cervical, Fall Precaution Comments: Using BLE ACE wraps for BP control at this time. Monitor this Required Braces or Orthoses: Cervical Brace Cervical Brace: Hard collar, At all  times Restrictions Weight Bearing Restrictions: No Vital Signs: Therapy Vitals Temp: 98.2 F (36.8 C) Temp Source: Oral Pulse Rate: (!) 42 Resp: 16 BP: (!) 91/55 mmHg Patient Position (if appropriate): Sitting Oxygen Therapy SpO2: 94 % O2 Device: Not Delivered Pain: Pain Assessment Pain Assessment: 0-10 Pain Score: 0-No pain  Function:   Bed Mobility Roll left and right activity   Assist level: 2 helpers  Sit to lying activity   Assist level: 2 helpers  Lying to sitting activity   Assist level: 2 helpers  Mobility details Bed mobility details: Tactile cues for initiation;Verbal cues for sequencing;Verbal cues for techniques;Tactile cues for sequencing;Tactile cues for posture   Transfers Sit to stand transfer   Sit to stand assist level: 2 helpers Sit to stand assistive device: Other (standing frame)  Chair/bed transfer   Chair/bed transfer method: Lateral scoot Chair/bed transfer assist level: 2 helpers Chair/bed transfer assistive device: Sliding board;Armrests;Orthosis   Chair/bed transfer details: Verbal cues for sequencing;Verbal cues for technique;Verbal cues for precautions/safety;Manual facilitation for weight shifting  Locomotion                               Wheelchair       Assist Level: Dependent (Pt equals 0%)  Wheel 50 feet with 2 turns activity   Assist Level: Dependent (Pt equals 0%)  Wheel 150 feet activity   Assist Level: Dependent (Pt equals 0%)    Therapy/Group: Individual Therapy  Edman Circle Faucette 11/27/2014, 5:08 PM

## 2014-11-27 NOTE — Progress Notes (Signed)
Orthopedic Tech Progress Note Patient Details:  Carl [name] 1951/04/07 [id number]  Patient ID: [name], male   DOB: 1950-07-24, 64 y.o.   MRN: [id number]   [name] 11/27/2014, 11:15 AMcalled hanger for right resting wrist and hand orthosis.

## 2014-11-27 NOTE — Progress Notes (Signed)
Occupational Therapy Session Note  Patient Details  Name: Carl [name] MRN: [id number] Date of Birth: 05-Sep-1950  Today's Date: 11/27/2014 OT Individual Time: 0800-0900 OT Individual Time Calculation (min): 60 min    Short Term Goals: Week 1:  OT Short Term Goal 1 (Week 1): Pt will bathe in supine with mod A OT Short Term Goal 2 (Week 1): Pt will direct care for bed mobility with min questioning cues OT Short Term Goal 3 (Week 1): Pt will self feed 50% of meal with min A  Skilled Therapeutic Interventions/Progress Updates:    Pt resting in bed with HOB elevated with wife feeding breakfast.  Pt c/o "not feeling right" and HOB lowered about 15 degrees.  BP at 89/52 and HR at 40.  Pt stated he felt like he was having an additional BM.  Pt initiated rolling to left to facilitate BM and changing of adult brief after hygiene.  Ace wraps were applied and BP at 84/47. RN aware. HOB elevated to facilitate donning of shirt.  Pt initiated threading bilateral shirt sleeves but required assistance to complete task.  Pt was able to pull shirt over head.  Focus on activity tolerance, bed mobility, and UB dressing to increase independence with BADLs and decrease burden of care.  Therapy Documentation Precautions:  Precautions Precautions: Cervical, Fall Precaution Comments: Using BLE ACE wraps for BP control at this time. Monitor this Required Braces or Orthoses: Cervical Brace Cervical Brace: Hard collar, At all times Restrictions Weight Bearing Restrictions: No  Pain: Pain Assessment Pain Assessment: No/denies pain Pain Score: 0-No pain  Function:   Eating Eating         Helper Scoops Food on Utensil: Every scoop Helper Brings Food to Mouth: Every scoop                                                         Upper Body Dressing/Undressing Upper body dressing   What is the patient wearing?: Pull over shirt/dress     Pull over shirt/dress - Perfomed by patient: Put head through  opening Pull over shirt/dress - Perfomed by helper: Thread/unthread right sleeve;Thread/unthread left sleeve;Pull shirt over trunk        Upper body assist Assist Level: 2 helpers         Printmaker assist Assist level: Two helpers         Therapy/Group: Individual Therapy  Rich Brave 11/27/2014, 12:21 PM

## 2014-11-27 NOTE — Progress Notes (Signed)
[facility]     PROGRESS NOTE    Subjective/Complaints: Working on bowel program. Pain increased at times---hesitant to take medication due to bp.    ROS: Pt denies  , rash/itching, headache, blurred or double vision, nausea, vomiting, abdominal pain, diarrhea, chest pain, shortness of breath, palpitations, dysuria, dizziness,  back pain, bleeding, anxiety, or depression + neck pain controlled  Objective: Vital Signs: Blood pressure 89/52, pulse 40, temperature 98.9 F (37.2 C), temperature source Oral, resp. rate 18, height  (1.727 m), weight 87.59 kg (193 lb 1.6 oz), SpO2 94 %. No results found.  Recent Labs  11/27/14 0450  WBC 11.1*  HGB 11.6*  HCT 34.4*  PLT 397    Recent Labs  11/27/14 0450  NA 134*  K 4.5  CL 98*  GLUCOSE 108*  BUN 19  CREATININE 1.03  CALCIUM 8.7*   CBG (last 3)  No results for input(s): GLUCAP in the last 72 hours.  Wt Readings from Last 3 Encounters:  11/22/14 87.59 kg (193 lb 1.6 oz)  11/16/14 90.719 kg (200 lb)    Physical Exam:  Constitutional: He is oriented to person, place, and time. He appears well-developed and well-nourished.  HENT:  Head: Normocephalic and atraumatic.  Healing abrasion on face/right eyelid  Eyes: Conjunctivae are normal. Pupils are equal, round, and reactive to light.  Neck:  C Collar.  Cardiovascular: Normal rate and regular rhythm.  Respiratory: Effort normal. No respiratory distress. He has no wheezes.  GI: Soft. He exhibits distension. Bowel sounds are decreased. There is tenderness.  Genitourinary:  Foley with clear urine  Musculoskeletal: He exhibits tenderness. He exhibits no edema.  Neurological: He is alert and oriented to person, place, and time. No cranial nerve deficit. Coordination normal.  Patient has normal sensation proximal to C6 dermatome. Patient has reduced sensation to pinprick but is able to identify the area touched At bilateral C6  through sacral levels Patient has decreased tone in bilateral Lower extremities 4/5 bilateral deltoids, biceps, 4- triceps, 4 wrist extensors, trace right HI, 1+ to 2- left HI, right HF, KE and ADF/APF are 1/5. 0/5 left hip flexors and knee extensors ankle dorsiflexors and plantar flexors  Skin: Skin is warm and dry.  Cervical incision clean and intact   Assessment/Plan: 1. 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 Physiatrist is providing close team supervision and 24 hour management of active medical problems listed below. Physiatrist and rehab team continue to assess barriers to discharge/monitor patient progress toward functional and medical goals.  FIM: Function - Bathing Position: Bed Body parts bathed by patient: Right arm, Left arm, Chest, Abdomen Body parts bathed by helper: Front perineal area, Buttocks, Right upper leg, Left upper leg, Right lower leg, Left lower leg Assist Level: 2 helpers, Assistive device Assistive Device Comment: LH sponge  Function- Upper Body Dressing/Undressing What is the patient wearing?: Pull over shirt/dress Pull over shirt/dress - Perfomed by patient: Put head through opening Pull over shirt/dress - Perfomed by helper: Thread/unthread right sleeve, Thread/unthread left sleeve, Pull shirt over trunk Assist Level: 2 helpers Function - Lower Body Dressing/Undressing What is the patient wearing?: Pants Position: Bed Pants- Performed by helper: Thread/unthread right pants leg, Thread/unthread left pants leg, Pull pants up/down Non-skid slipper socks- Performed by helper: Don/doff right sock, Don/doff left sock Socks - Performed by helper: Don/doff right sock, Don/doff left sock Shoes -  Performed by helper: Don/doff right shoe, Don/doff left shoe, Fasten left, Fasten right Assist Level: 2 Helpers  Function - Toileting Toileting activity did  not occur: No continent bowel/bladder event Assist level: Two helpers  Function - Archivist transfer activity did not occur: Safety/medical concerns  Function - Chair/bed transfer Chair/bed transfer method: Lateral scoot Chair/bed transfer assist level: 2 helpers Chair/bed transfer assistive device: Sliding board, Armrests, Orthosis Chair/bed transfer details: Verbal cues for precautions/safety, Verbal cues for technique, Verbal cues for sequencing, Manual facilitation for weight shifting, Manual facilitation for placement  Function - Locomotion: Wheelchair Will patient use wheelchair at discharge?: Yes Type: Manual Assist Level: Dependent (Pt equals 0%) Assist Level: Dependent (Pt equals 0%) Assist Level: Dependent (Pt equals 0%) Function - Locomotion: Ambulation Ambulation activity did not occur: Safety/medical concerns Walk 10 feet activity did not occur: Safety/medical concerns Walk 50 feet with 2 turns activity did not occur: Safety/medical concerns Walk 150 feet activity did not occur: Safety/medical concerns Walk 10 feet on uneven surfaces activity did not occur: Safety/medical concerns  Function - Comprehension Comprehension: Auditory Comprehension assist level: Follows basic conversation/direction with no assist  Function - Expression Expression: Verbal Expression assist level: Expresses complex 90% of the time/cues < 10% of the time  Function - Social Interaction Social Interaction assist level: Interacts appropriately with others with medication or extra time (anti-anxiety, antidepressant).  Function - Problem Solving Problem solving assist level: Solves complex problems: Recognizes & self-corrects  Function - Memory Memory assist level: More than reasonable amount of time Patient normally able to recall (first 3 days only): Location of own room, Staff names and faces, That he or she is in a hospital, Current season Medical Problem List and Plan: 1.  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 DVT Prophylaxis/Anticoagulation: Pharmaceutical: Lovenox to continue  -dopplers negative 3. Pain Management: Continue Lyrica BID for neuropathic pain.tramdol for mod and oxy for severe pain 4. Mood: Team to provide ego support.  .    -consider antidepressant, neuropsych eval completed 5. Neuropsych: This patient is capable of making decisions on his own behalf. 6. Skin/Wound Care: Routine pressure relief measures.  7. Fluids/Electrolytes/Nutrition: 100% meals  , enc fluids between meals 8. ZOX:WRUEAVWU negatie. continue to encourage IS. Blood cultures if patient spikes again.  -recent cxr normal  -ucx + for klebsiella oxytoca--continue septra for 7 total days, d/c date 8/25  -afebrile over last 72 hours 9. CV: abd binder/compression stockings to maintain sitting/upright bp, check orthostatic vitals  -does not require pharmaceutical rx at present  -encourage fluids  -limit pain meds (as possible) 10. Acutre renal failure---volume depletion---responded to ivf   - push po  -I personally reviewed the patient's labs today. Bun/cr normal 11. Neurogenic bladder---will hold on voiding trial until tuesday 12. Neurogenic bowel: working on qd bowel program. +bm this am  LOS (Days) 7 A FACE TO FACE EVALUATION WAS PERFORMED  Rosabell Geyer T 11/27/2014 9:01 AM

## 2014-11-27 NOTE — Progress Notes (Signed)
Occupational Therapy Session Note  Patient Details  Name: Carl [name] MRN: [id number] Date of Birth: 1950/10/12  Today's Date: 11/27/2014 OT Individual Time: 1300-1400 OT Individual Time Calculation (min): 60 min    Short Term Goals: Week 1:  OT Short Term Goal 1 (Week 1): Pt will bathe in supine with mod A OT Short Term Goal 2 (Week 1): Pt will direct care for bed mobility with min questioning cues OT Short Term Goal 3 (Week 1): Pt will self feed 50% of meal with min A  Skilled Therapeutic Interventions/Progress Updates:    Pt seen for OT therapy session focusing on ADL re-training. Pt sitting up in w/c upon arrival, with NT present helping feed pt lunch. OT assisted with remainder of lunch, applying universal cuff to R hand with spoon placed. Pt able to self feed with set up of AE. He then voiced need for boosting to be performed, able to direct care with task. Pt voiced desire to complete grooming task. Set up in front of sink, pt completed oral care and face washing task, see below for required assist. He required assist due to decreased fine motor coordination  And decreased grip strength. Physical assist required to help pt come off back of w/c to access sink due to decreased core strength/stability. Pt left in w/c at end of session, all needs in reach.   Pt educated regarding importance of participation with ADLs , boosting schedule, OOB schedule, and d/c planning.   Therapy Documentation Precautions:  Precautions Precautions: Cervical, Fall Precaution Comments: Using BLE ACE wraps for BP control at this time. Monitor this Required Braces or Orthoses: Cervical Brace Cervical Brace: Hard collar, At all times Restrictions Weight Bearing Restrictions: No Pain: Pain Assessment Pain Assessment: No/denies pain Faces Pain Scale: Hurts a little bit Pain Location: Shoulder Pain Orientation: Left Pain Descriptors / Indicators: Aching Pain Intervention(s):  Repositioned;Acupuncture  Function:   Eating Eating   Eating Assist Level: Set up assist for;More than reasonable amount of time;Help managing cup/glass   Eating Set Up Assist For: Opening containers;Applying device (includes dentures) Helper Scoops Food on Utensil: Every scoop Helper Brings Food to Mouth: Every scoop   Grooming Oral Care,Brush Teeth, Clean Dentures Activity:      Assist Level: Assistive device;Set up;Touching or steadying assistance(Pt > 75%) (Assist required for pt to come off back of w/c to reach sink) Assistive Device Comment: Set-up with universal cuff Set up : To open containers;To adjust water temperature  Wash, Rinse, Dry Face Activity   Assist Level: Set up   Set up : To obtain items;To adjust water temperature  Wash, Rinse, Dry Hands Activity   Assist Level: Touching or steadying assistance(Pt > 75%)      Brush, Comb Hair Activity        Shave Activity          Apply Makeup Activity                                                              Upper body assist Assist Level: 2 helpers        Lower body assist                Therapy/Group: Individual Therapy  Lewis, Graceanne Guin C 11/27/2014, 3:15 PM

## 2014-11-27 NOTE — Progress Notes (Signed)
Occupational Therapy Session Note  Patient Details  Name: Carl [name] MRN: [id number] Date of Birth: 07/14/1950  Today's Date: 11/27/2014 OT Individual Time: 0930-1030 OT Individual Time Calculation (min): 60 min    Short Term Goals: Week 1:  OT Short Term Goal 1 (Week 1): Pt will bathe in supine with mod A OT Short Term Goal 2 (Week 1): Pt will direct care for bed mobility with min questioning cues OT Short Term Goal 3 (Week 1): Pt will self feed 50% of meal with min A  Skilled Therapeutic Interventions/Progress Updates: Therapeutic activity with focus on improved activity tolerance, sitting balance, direction of caregivers (sequencing), bed mobility, slide board transfer, and BUE strengthening.  Pt received supine in bed with abdominal binder and ace wrap on BLE.   Pt reported awareness of low BP and need for recovery supine but was willing to continue assisted BADL (lower body dressing) and transfer.   Pt benefits from +2 assist however completes bed mobility with use of DME and only 1 assist (max) to manage LE and assist with trunk rotation.   Pt rose from side lying to EOB with max assist (pt=20% trunk lift off with HOB elevated).   Pt maintains static sitting with +2 helper to maintain postural control unsupported at EOB.   Pt able to sequence 75% of slide board transfer and completed transfer with max A X 1.   Vitals monitored during session with low of 86/54 when sitting at EOB unsupported.   Pt recovered to 104/73 after slide board transfer and being reclined in w/c for 5 min before resuming tx.   Pt progressed through BUE strengthening with therapist providing manual facilitation to resist movements through elbow flex/ex and wrist flex/ex, 10 reps each, unilaterally.     Therapy Documentation Precautions:  Precautions Precautions: Cervical, Fall Precaution Comments: Using BLE ACE wraps for BP control at this time. Monitor this Required Braces or Orthoses: Cervical Brace Cervical  Brace: Hard collar, At all times Restrictions Weight Bearing Restrictions: No  Pain: Pain Assessment Pain Assessment: No/denies pain Pain Score: 0-No pain  Therapy/Group: Individual Therapy  Haunani Dickard 11/27/2014, 12:23 PM

## 2014-11-28 ENCOUNTER — Inpatient Hospital Stay (HOSPITAL_COMMUNITY): Payer: Federal, State, Local not specified - PPO | Admitting: Occupational Therapy

## 2014-11-28 ENCOUNTER — Inpatient Hospital Stay (HOSPITAL_COMMUNITY): Payer: Federal, State, Local not specified - PPO | Admitting: Physical Therapy

## 2014-11-28 MED ORDER — MIDODRINE HCL 2.5 MG PO TABS
2.5000 mg | ORAL_TABLET | Freq: Two times a day (BID) | ORAL | Status: DC
  Administered 2014-11-28 – 2014-11-30 (×4): 2.5 mg via ORAL
  Filled 2014-11-28 (×7): qty 1

## 2014-11-28 NOTE — Progress Notes (Signed)
Physical Therapy Session Note  Patient Details  Name: Carl [name] MRN: [id number] Date of Birth: 1951-02-08  Today's Date: 11/28/2014 PT Individual Time:0800-900 and  1100-1200 PT Individual Time Calculation (min): 60 min and 60 min  Short Term Goals: Week 1:  PT Short Term Goal 1 (Week 1): Pt will be able to verbalize need for pressure relief when OOB in w/c every 30 min PT Short Term Goal 1 - Progress (Week 1): Met PT Short Term Goal 2 (Week 1): Pt will be able to roll with max A of 1 using bed rails PRN PT Short Term Goal 3 (Week 1): Pt will be able to tolerate EOB x 10 min with max A  during functional task PT Short Term Goal 3 - Progress (Week 1): Met PT Short Term Goal 4 (Week 1): Pt will be able to tolerate OOB x 2 hours a day PT Short Term Goal 4 - Progress (Week 1): Met  Skilled Therapeutic Interventions/Progress Updates:  Session 1 focused on bed mobility, and LE stretching. SPT and PT assisted pt with LB/UB dressing, donning tedhose, shoes, and abdominal binder and bed mobility see details below. Pt was experiencing low BP intially 89/48 that dropped with position change to sitting EOB to 65/33. Pt repositioned supine in bed with SPT performing manual stretching with instruction to wife with return demonstration to bilat LE's for heelcords, and hamstrings, and wife declined hands on stretching for hip IR,ER, and abductors all 3X30 sec. Pt left in bed with bed alarm set, all needs in reach and wife present.  Session 2 focused on sitting balance, and activity tolerance. Pt received in bed with abdominal binder, and ted hose, ace wraps applied bilat. Pt performed slideboard transfers bed<>wheelchair X 3 with 2 helpers and total A and pt verbalizing sequence and technique requiring min to mod cues. Pt performed core strengthening/sitting balance activity in wheelchair with feet supported reaching for beach ball in multidirections with manual facilitation at hips and low back for anterior  pelvic tilt. Pt demonstrated balance reactions with the ability to catch himself with hands on knees. Pt performed sitting edge of mat activity of lateral weight shifting onto elbow and returning to midline then repeated to other side requiring mod A and cuing for sequence, technique. Pt tolerated sitting activity with BP increasing from 65/39 to 99/59 after activity. Pt returned to room and left in tilt-in-space wheelchair with nursing staff present and timer started for boosting schedule.  Therapy Documentation Precautions:  Precautions Precautions: Cervical, Fall Precaution Comments: Using BLE ACE wraps for BP control at this time. Monitor this Required Braces or Orthoses: Cervical Brace Cervical Brace: Hard collar, At all times Restrictions Weight Bearing Restrictions: No General:   Vital Signs: Therapy Vitals Pulse Rate: (!) 42 BP: (!) 99/59 mmHg (after activity) Patient Position (if appropriate): Sitting Pain: Pain Assessment: faces Pain Score: hurts a little Pain Location: Shoulder Pain Orientation: Mid Pain Descriptors / Indicators: Aching Pain Intervention(s):;Repositioned, rest   Function:  Toileting Toileting             Bed Mobility Roll left and right activity   Assist level: 2 helpers Roll left and right assistive device: Bedrails;HOB elevated  Sit to lying activity   Assist level: 2 helpers    Lying to sitting activity   Assist level: 2 helpers Lying to sitting assistive device: HOB elevated  Mobility details Bed mobility details: Tactile cues for initiation;Verbal cues for sequencing;Verbal cues for techniques;Tactile cues for sequencing;Tactile cues for posture  Transfers Sit to stand transfer        Chair/bed transfer   Chair/bed transfer method: Lateral scoot Chair/bed transfer assist level: 2 helpers Chair/bed transfer assistive device: Sliding board;Armrests;Orthosis   Chair/bed transfer details: Verbal cues for sequencing;Verbal cues for  technique;Verbal cues for precautions/safety;Manual facilitation for weight shifting   Toilet transfer                Car transfer          Locomotion Ambulation          Walk 10 feet activity      Walk 50 feet with 2 turns activity      Walk 150 feet activity      Walk 10 feet on uneven surfaces activity      Stairs          Walk up/down 1 step activity        Walk up/down 4 steps activity      Walk up/down 12 steps activity      Pick up small objects from floor      Wheelchair   Type: Manual   Assist Level: Dependent (Pt equals 0%)  Wheel 50 feet with 2 turns activity   Assist Level: Dependent (Pt equals 0%)  Wheel 150 feet activity   Assist Level: Dependent (Pt equals 0%)   Cognition Comprehension Comprehension assist level: Follows complex conversation/direction with no assist  Expression Expression assist level: Expresses complex ideas: With no assist  Social Interaction Social Interaction assist level: Interacts appropriately with others with medication or extra time (anti-anxiety, antidepressant).  Problem Solving Problem solving assist level: Solves complex problems: Recognizes & self-corrects  Memory Memory assist level: More than reasonable amount of time    Therapy/Group: Individual Therapy  Elsie Ra 11/28/2014, 12:18 PM

## 2014-11-28 NOTE — Progress Notes (Signed)
Occupational Therapy Weekly Progress Note  Patient Details  Name: Carl [name] MRN: [id number] Date of Birth: [birth year]  Beginning of progress report period: November 21, 2014 End of progress report period: November 28, 2014  Today's Date: 11/28/2014 OT Individual Time: 1430-1500 and 1430-1500 OT Individual Time Calculation (min): 30 min and 30 min   Patient has met 2 of 3 short term goals.  Pt did not meet bathing goal. Pt able to bathe 7/10 parts, however, he requires +2 assist to roll in order for buttock hygiene to be completed.   Patient continues to demonstrate the following deficits: abnormal posture, acute pain, muscle weakness (generalized), pain in joint and paraplegia at level C5-C6 and therefore will continue to benefit from skilled OT intervention to enhance overall performance with BADL and Reduce care partner burden.  Patient progressing toward long term goals..  Continue plan of care.  OT Short Term Goals Week 1:  OT Short Term Goal 1 (Week 1): Pt will bathe in supine with mod A OT Short Term Goal 1 - Progress (Week 1): Not met OT Short Term Goal 2 (Week 1): Pt will direct care for bed mobility with min questioning cues OT Short Term Goal 2 - Progress (Week 1): Met OT Short Term Goal 3 (Week 1): Pt will self feed 50% of meal with min A OT Short Term Goal 3 - Progress (Week 1): Met Week 2:  OT Short Term Goal 1 (Week 2): Pt will thread B UEs into shirt in order to increase independence with ADLs OT Short Term Goal 2 (Week 2): Pt will place toothpaste on toothbrush with set-up to open container during grooming task to increase independence with ADLs OT Short Term Goal 3 (Week 2): Pt will manipulate water cup and bring to mouth independently in order to increase independence with ADLs.   Skilled Therapeutic Interventions/Progress Updates:    Session One:  Pt seen for OT therapy session focusing on ADL re-training and mobility. Pt in supine upon arrival, agreeable to tx  session. Assist had already been provided for dressing task. Pt's BP with HOB slightly elevated was 81/50 with TEDs donned. ACE wraps applied and BP 85/53. HOB gradually raised and pt placed in full right sitting position in bed, BP 82/47. In upright sitting, pt educated regarding circle sit position for LB bathing/ dressing tasks. He was able to assist with managing LE to bring into semi-circle sit position. Pt able to tolerate circle sit position with one LE positioned in circle position at a time. Focus on pt straightening LE back to extension, able to straighten L LE out without use of UEs demonstrating improved movement in L  LE. While in circle sit position, pt actively participated in donning of shoes.  He transferred to EOB with +2 assist, and completed sliding board transfer into w/c with +1 max A for physical assistance and +2 available to steady equipment. Upon getting in w/c, BP 79/44, and pt voiced he felt he had BM. He returned to bed in same manner as described above. Left in supine with staff present to assist with hygiene.  Session Two: Pt seen for OT therapy session focusing on education and bed mobility. Pt in supine upon arrival, agreeable to tx session. Pt educated extensively regarding hands free phone device for use while in rehab and potential for use at d/c. Pt demonstrated ability to push buttons to activate machine, will cont to work with pt in gaining competence and experience with use of phone. He  then practiced bed mobility using leg loops. Work with pt to obtain circle sitting position, he required assist to position B LEs into circle sit position, however, demonstrated increased independence with mobility compared to previous session without leg loops. He was able to return both legs to extended position with only VCs for technique. Pt voiced desire to turn to sidelying position for comfort. Max A required to roll pt, with pt able to assist by hooking elbow to bed rail to assist  with rolling. Pt left in sidelying position, all needs in reach.  Pt educated regarding importance of self advocacy, hands free phone device, use of leg loops, benefits of independence with mobility, and skin care.   Therapy Documentation Precautions:  Precautions Precautions: Cervical, Fall Precaution Comments: Using BLE ACE wraps for BP control at this time. Monitor this Required Braces or Orthoses: Cervical Brace Cervical Brace: Hard collar, At all times Restrictions Weight Bearing Restrictions: No Pain:  Voiced some discomfort in L UE due to strain when performing bed mobility.   Function:    Upper Body Dressing/Undressing Upper body dressing   What is the patient wearing?: Pull over shirt/dress     Pull over shirt/dress - Perfomed by patient: Put head through opening Pull over shirt/dress - Perfomed by helper: Thread/unthread right sleeve;Thread/unthread left sleeve;Pull shirt over trunk        Upper body assist Assist Level: 2 helpers   Set up : To obtain clothing/put away   Lower Body Dressing/Undressing Lower body dressing   What is the patient wearing?: Pants       Pants- Performed by helper: Thread/unthread right pants leg;Thread/unthread left pants leg;Pull pants up/down           Shoes - Performed by helper: Don/doff right shoe;Don/doff left shoe;Fasten right;Fasten left          Lower body assist Assist Level: 2 Helpers        Bed Mobility Roll left and right activity   Assist level: Max assist (Pt 25 - 49%)  Sit to lying activity   Assist level: 2 helpers  Lying to sitting activity   Assist level: 2 helpers  Mobility details Bed mobility details: Tactile cues for initiation;Tactile cues for placement;Tactile cues for sequencing;Visual cues/gestures for sequencing;Verbal cues for techniques;Verbal cues for sequencing;Manual facilitation for weight shifting;Manual facilitation for placement   Transfers Sit to stand transfer         Chair/bed transfer   Chair/bed transfer method: Lateral scoot Chair/bed transfer assist level: 2 helpers Chair/bed transfer assistive device: Sliding board;Armrests;Orthosis   Chair/bed transfer details: Verbal cues for sequencing;Verbal cues for technique;Verbal cues for precautions/safety;Manual facilitation for weight shifting;Tactile cues for posture  Toilet transfer                Tub/shower transfer             Cognition Comprehension Comprehension assist level: Follows complex conversation/direction with no assist  Expression Expression assist level: Expresses complex ideas: With no assist  Social Interaction Social Interaction assist level: Interacts appropriately with others with medication or extra time (anti-anxiety, antidepressant).  Problem Solving Problem solving assist level: Solves complex problems: Recognizes & self-corrects  Memory Memory assist level: More than reasonable amount of time    Therapy/Group: Individual Therapy  Lewis, Telissa Palmisano C 11/28/2014, 3:47 PM

## 2014-11-28 NOTE — Progress Notes (Signed)
[facility]     PROGRESS NOTE    Subjective/Complaints: A little sore. States pain is tolerable. Able to sleep. Received WHO    ROS: Pt denies  , rash/itching, headache, blurred or double vision, nausea, vomiting, abdominal pain, diarrhea, chest pain, shortness of breath, palpitations, dysuria, dizziness,  back pain, bleeding, anxiety, or depression + neck pain controlled  Objective: Vital Signs: Blood pressure 93/57, pulse 43, temperature 97.9 F (36.6 C), temperature source Oral, resp. rate 17, height 5\' 8"  (1.727 m), weight 87.59 kg (193 lb 1.6 oz), SpO2 96 %. No results found.  Recent Labs  11/27/14 0450  WBC 11.1*  HGB 11.6*  HCT 34.4*  PLT 397    Recent Labs  11/27/14 0450  NA 134*  K 4.5  CL 98*  GLUCOSE 108*  BUN 19  CREATININE 1.03  CALCIUM 8.7*   CBG (last 3)  No results for input(s): GLUCAP in the last 72 hours.  Wt Readings from Last 3 Encounters:  11/22/14 87.59 kg (193 lb 1.6 oz)  11/16/14 90.719 kg (200 lb)    Physical Exam:  Constitutional: He is oriented to person, place, and time. He appears well-developed and well-nourished.  HENT:  Head: Normocephalic and atraumatic.  Healing abrasion on face/right eyelid  Eyes: Conjunctivae are normal. Pupils are equal, round, and reactive to light.  Neck:  C Collar.  Cardiovascular: Normal rate and regular rhythm.  Respiratory: Effort normal. No respiratory distress. He has no wheezes.  GI: Soft. He exhibits distension. Bowel sounds are decreased. There is tenderness.  Genitourinary:  Foley with clear urine  Musculoskeletal: He exhibits tenderness. He exhibits no edema.  Neurological: He is alert and oriented to person, place, and time. No cranial nerve deficit. Coordination normal.  Patient has normal sensation proximal to C6 dermatome. Patient has reduced sensation to pinprick but is able to identify the area touched At bilateral C6 through sacral  levels Patient has decreased tone in bilateral Lower extremities 4/5 bilateral deltoids, biceps, 4- triceps, 4 wrist extensors, trace right HI, 1+ to 2- left HI, right HF, KE and ADF/APF are 1/5. 0/5 left hip flexors and knee extensors ankle dorsiflexors and plantar flexors  Skin: Skin is warm and dry.  Cervical incision clean and intact   Assessment/Plan: 1. 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 Physiatrist is providing close team supervision and 24 hour management of active medical problems listed below. Physiatrist and rehab team continue to assess barriers to discharge/monitor patient progress toward functional and medical goals.  FIM: Function - Bathing Position: Bed Body parts bathed by patient: Right arm, Left arm, Chest, Abdomen Body parts bathed by helper: Front perineal area, Buttocks, Right upper leg, Left upper leg, Right lower leg, Left lower leg Assist Level: 2 helpers, Assistive device Assistive Device Comment: LH sponge  Function- Upper Body Dressing/Undressing What is the patient wearing?: Pull over shirt/dress Pull over shirt/dress - Perfomed by patient: Put head through opening Pull over shirt/dress - Perfomed by helper: Thread/unthread right sleeve, Thread/unthread left sleeve, Pull shirt over trunk Assist Level: 2 helpers Function - Lower Body Dressing/Undressing What is the patient wearing?: Pants Position: Bed Pants- Performed by helper: Thread/unthread right pants leg, Thread/unthread left pants leg, Pull pants up/down Non-skid slipper socks- Performed by helper: Don/doff right sock, Don/doff left sock Socks - Performed by helper: Don/doff right sock, Don/doff left sock Shoes - Performed  by helper: Don/doff right shoe, Don/doff left shoe, Fasten left, Fasten right Assist Level: 2 Helpers  Function - Toileting Toileting activity did not occur: No  continent bowel/bladder event Toileting steps completed by helper: Adjust clothing prior to toileting, Performs perineal hygiene, Adjust clothing after toileting Assist level: Two helpers  Function - Archivist transfer activity did not occur: Safety/medical concerns  Function - Chair/bed transfer Chair/bed transfer method: Lateral scoot Chair/bed transfer assist level: 2 helpers Chair/bed transfer assistive device: Sliding board, Armrests, Orthosis Chair/bed transfer details: Verbal cues for sequencing, Verbal cues for technique, Verbal cues for precautions/safety, Manual facilitation for weight shifting  Function - Locomotion: Wheelchair Will patient use wheelchair at discharge?: Yes Type: Manual Assist Level: Dependent (Pt equals 0%) Assist Level: Dependent (Pt equals 0%) Assist Level: Dependent (Pt equals 0%) Function - Locomotion: Ambulation Ambulation activity did not occur: Safety/medical concerns Walk 10 feet activity did not occur: Safety/medical concerns Walk 50 feet with 2 turns activity did not occur: Safety/medical concerns Walk 150 feet activity did not occur: Safety/medical concerns Walk 10 feet on uneven surfaces activity did not occur: Safety/medical concerns  Function - Comprehension Comprehension: Auditory Comprehension assist level: Follows complex conversation/direction with no assist  Function - Expression Expression: Verbal Expression assist level: Expresses complex 90% of the time/cues < 10% of the time  Function - Social Interaction Social Interaction assist level: Interacts appropriately with others with medication or extra time (anti-anxiety, antidepressant).  Function - Problem Solving Problem solving assist level: Solves complex problems: Recognizes & self-corrects  Function - Memory Memory assist level: More than reasonable amount of time Patient normally able to recall (first 3 days only): Location of own room, Staff names and  faces, That he or she is in a hospital, Current season Medical Problem List and Plan: 1. 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 DVT Prophylaxis/Anticoagulation: Pharmaceutical: Lovenox to continue  -dopplers negative 3. Pain Management: Continue Lyrica BID for neuropathic pain.tramdol for mod and oxy for severe pain 4. Mood: Team to provide ego support.  .    -continue to monitor clinically. neuropsych consult appreciated 5. Neuropsych: This patient is capable of making decisions on his own behalf. 6. Skin/Wound Care: Routine pressure relief measures.  7. Fluids/Electrolytes/Nutrition: 100% meals  , enc fluids between meals 8. ZOX:WRUEAVWU negatie. continue to encourage IS. Blood cultures if patient spikes again.  -recent cxr normal  -ucx + for klebsiella oxytoca--continue septra through 8/25  -afebrile  9. CV: abd binder/compression stockings to maintain sitting/upright bp, check orthostatic vitals  -does not require pharmaceutical rx at present  -encourage fluids  -limit pain meds (as possible) 10. Acutre renal failure---volume depletion---responded to ivf   - push po  - Bun/cr normal 11. Neurogenic bladder---dc foley--voiding trial 12. Neurogenic bowel: qam bowel program improving  LOS (Days) 8 A FACE TO FACE EVALUATION WAS PERFORMED  SWARTZ,ZACHARY T 11/28/2014 8:00 AM

## 2014-11-29 ENCOUNTER — Inpatient Hospital Stay (HOSPITAL_COMMUNITY): Payer: Federal, State, Local not specified - PPO | Admitting: Occupational Therapy

## 2014-11-29 ENCOUNTER — Inpatient Hospital Stay (HOSPITAL_COMMUNITY): Payer: Federal, State, Local not specified - PPO | Admitting: Physical Therapy

## 2014-11-29 DIAGNOSIS — S12400A Unspecified displaced fracture of fifth cervical vertebra, initial encounter for closed fracture: Secondary | ICD-10-CM

## 2014-11-29 NOTE — Progress Notes (Signed)
Social Work Patient ID: Carl [name], male   DOB: 04/12/1950, 64 y.o.   MRN: [id number]   Met with pt to review team conference.  Pt understanding that d/c date still targeted for 9/13 with min/mod assistance w/c goals.  Pt pleased that he is experiencing some slight return of movement in LE, however, states, "I just wish it would happen faster and more."  He reports new pain in left shoulder the past couple of days.  Remains very reserved with affect and verbal expression.  He does become tearful briefly when he talks about his wife and states, "I hate that I'm putting her through all this..."  Attempted to encourage more discussion about his situation, worries, however, he states simply, "...but we'll get through this...just praying for a miracle."   Will continue to follow for emotional support and d/c planning.  Desire Fulp, LCSW

## 2014-11-29 NOTE — Progress Notes (Signed)
[facility]     PROGRESS NOTE    Subjective/Complaints: Notes some tingling/burning in LUE--not too severe.    ROS: Pt denies  , rash/itching, headache, blurred or double vision, nausea, vomiting, abdominal pain, diarrhea, chest pain, shortness of breath, palpitations, dysuria, dizziness,  back pain, bleeding, anxiety, or depression    Objective: Vital Signs: Blood pressure 95/45, pulse 48, temperature 98.5 F (36.9 C), temperature source Oral, resp. rate 16, height  (1.727 m), weight 87.59 kg (193 lb 1.6 oz), SpO2 95 %. No results found.  Recent Labs  11/27/14 0450  WBC 11.1*  HGB 11.6*  HCT 34.4*  PLT 397    Recent Labs  11/27/14 0450  NA 134*  K 4.5  CL 98*  GLUCOSE 108*  BUN 19  CREATININE 1.03  CALCIUM 8.7*   CBG (last 3)  No results for input(s): GLUCAP in the last 72 hours.  Wt Readings from Last 3 Encounters:  11/22/14 87.59 kg (193 lb 1.6 oz)  11/16/14 90.719 kg (200 lb)    Physical Exam:  Constitutional: He is oriented to person, place, and time. He appears well-developed and well-nourished.  HENT:  Head: Normocephalic and atraumatic.  Healing abrasion on face/right eyelid  Eyes: Conjunctivae are normal. Pupils are equal, round, and reactive to light.  Neck:  C Collar.  Cardiovascular: Normal rate and regular rhythm.  Respiratory: Effort normal. No respiratory distress. He has no wheezes.  GI: Soft. Bowel sounds +.   Genitourinary: foley out Musculoskeletal: He exhibits tenderness. He exhibits no edema.  Neurological: He is alert and oriented to person, place, and time. No cranial nerve deficit. Coordination normal.  Patient has normal sensation proximal to C6 dermatome. Patient has reduced sensation to pinprick but is able to identify the area touched at bilateral C6 through sacral levels Patient has decreased tone in bilateral Lower extremities 4/5 bilateral deltoids, biceps, 4- triceps, 4 wrist  extensors, trace right HI, 1+ to 2- left HI, right HF, KE and ADF/APF are 1/5. 0/5 left hip flexors and knee extensors ankle dorsiflexors and plantar flexors  Skin: Skin is warm and dry.  Cervical incision intact   Assessment/Plan: 1. 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 Physiatrist is providing close team supervision and 24 hour management of active medical problems listed below. Physiatrist and rehab team continue to assess barriers to discharge/monitor patient progress toward functional and medical goals.  FIM: Function - Bathing Position: Bed Body parts bathed by patient: Right arm, Left arm, Chest, Abdomen Body parts bathed by helper: Front perineal area, Buttocks, Right upper leg, Left upper leg, Right lower leg, Left lower leg Assist Level: 2 helpers, Assistive device Assistive Device Comment: LH sponge  Function- Upper Body Dressing/Undressing What is the patient wearing?: Pull over shirt/dress Pull over shirt/dress - Perfomed by patient: Put head through opening Pull over shirt/dress - Perfomed by helper: Thread/unthread right sleeve, Thread/unthread left sleeve, Pull shirt over trunk Assist Level: 2 helpers Set up : To obtain clothing/put away Function - Lower Body Dressing/Undressing What is the patient wearing?: Pants Position: Bed Pants- Performed by helper: Thread/unthread right pants leg, Thread/unthread left pants leg, Pull pants up/down Non-skid slipper socks- Performed by helper: Don/doff right sock, Don/doff left sock Socks - Performed by helper: Don/doff right sock, Don/doff left sock Shoes - Performed by helper: Don/doff right shoe, Don/doff left shoe, Fasten right, Fasten left  Assist Level: 2 Helpers  Function - Toileting Toileting activity did not occur: No continent bowel/bladder event Toileting steps completed by helper: Adjust clothing prior to  toileting, Performs perineal hygiene, Adjust clothing after toileting Assist level: Two helpers  Function - Archivist transfer activity did not occur: Safety/medical concerns  Function - Chair/bed transfer Chair/bed transfer method: Lateral scoot Chair/bed transfer assist level: 2 helpers Chair/bed transfer assistive device: Sliding board, Armrests, Orthosis Chair/bed transfer details: Verbal cues for sequencing, Verbal cues for technique, Verbal cues for precautions/safety, Manual facilitation for weight shifting, Tactile cues for posture  Function - Locomotion: Wheelchair Will patient use wheelchair at discharge?: Yes Type: Manual Assist Level: Dependent (Pt equals 0%) Assist Level: Dependent (Pt equals 0%) Assist Level: Dependent (Pt equals 0%) Function - Locomotion: Ambulation Ambulation activity did not occur: Safety/medical concerns Walk 10 feet activity did not occur: Safety/medical concerns Walk 50 feet with 2 turns activity did not occur: Safety/medical concerns Walk 150 feet activity did not occur: Safety/medical concerns Walk 10 feet on uneven surfaces activity did not occur: Safety/medical concerns  Function - Comprehension Comprehension: Auditory Comprehension assist level: Follows complex conversation/direction with no assist  Function - Expression Expression: Verbal Expression assist level: Expresses complex ideas: With no assist  Function - Social Interaction Social Interaction assist level: Interacts appropriately with others with medication or extra time (anti-anxiety, antidepressant).  Function - Problem Solving Problem solving assist level: Solves complex problems: Recognizes & self-corrects  Function - Memory Memory assist level: More than reasonable amount of time Patient normally able to recall (first 3 days only): Location of own room, Staff names and faces, That he or she is in a hospital, Current season Medical Problem List and  Plan: 1. 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 DVT Prophylaxis/Anticoagulation: Pharmaceutical: Lovenox to continue  -dopplers negative 3. Pain Management: Continue Lyrica BID for neuropathic pain.tramdol for mod and oxy for severe pain 4. Mood: Team to provide ego support.  .    -continue to monitor clinically. neuropsych consult appreciated 5. Neuropsych: This patient is capable of making decisions on his own behalf. 6. Skin/Wound Care: Routine pressure relief measures.  7. Fluids/Electrolytes/Nutrition: 100% meals  , enc fluids between meals 8. ZOX:WRUEAVWU negatie. afebrile  -ucx + for klebsiella oxytoca--continue septra through 8/25   9. CV: abd binder/compression stockings to maintain sitting/upright bp, check orthostatic vitals  -does not require pharmaceutical rx at present  -encourage fluids  -added low dose midodrine to assist bp while up  -limit pain meds (as possible) 10. Acutre renal failure---volume depletion---responded to ivf   - push po  - Bun/cr normal on most recent labs 11. Neurogenic bladder---I/O caths---no urge or sense of bladder fullness yesterday  -continue to keep volumes 300-500cc 12. Neurogenic bowel: qam bowel program improving in consistency  LOS (Days) 9 A FACE TO FACE EVALUATION WAS PERFORMED  SWARTZ,ZACHARY T 11/29/2014 8:07 AM

## 2014-11-29 NOTE — Progress Notes (Signed)
Physical Therapy Session Note  Patient Details  Name: Carl [name] MRN: [id number] Date of Birth: 02/12/1951  Today's Date: 11/29/2014 PT Individual Time: 1430-1530 PT Individual Time Calculation (min): 60 min   Short Term Goals: Week 2:  PT Short Term Goal 1 (Week 2): Pt will perform bed mobility rolling and supine <> sit with use of leg loops and max A of one person PT Short Term Goal 2 (Week 2): Pt will perform slideboard transfer bed <> chair on level surface with max A of one person and use of leg loops PT Short Term Goal 3 (Week 2): Pt will trial and perform w/c mobility in manual w/c x 50' with mod A  PT Short Term Goal 4 (Week 2): Pt will tolerate sitting up OOB x 4-6 hours and be independent with requesting pressure relief every 30 minutes PT Short Term Goal 5 (Week 2): Pt will tolerate standing in frame x 5 minutes for WB and core strengthening during dynamic UE activity  Skilled Therapeutic Interventions/Progress Updates:    Pt received up in w/c with hard c-collar in place, TEDs, thigh high ace wraps, and abdominal binder in place. Vitals stable and pt able and willing to participate in PT. W/C Management - see below for details. Pt attempts propulsion in TIS w/c due to long arm length, but distance limited by L shoulder pain. Neuromuscular Reeducation - PT instructs pt in B LE NMR activities: isometric glute squeezes x 5 second holds, isometric hip adduction in sit x 5 second holds, isometric hip abduction/ER in sit x 5 second holds, partial LAQ, partial knee flexion in sit, partial R AROM ankle DF, PROM L ankle DF, min resis to ankle PF, chair push-ups AAROM: x 10 reps each. PT places pt in standing frame with vitals monitored closely and pt tolerates 3 bouts of 1.5 minutes up in frame with pt attempting to push through legs to stand - time limited by dizziness/low BP/high HR. Pt ended up in w/c with soft call bell in place and all needs in reach, tilted slightly, reporting comfort.  Continue per PT POC.   Therapy Documentation Precautions:  Precautions Precautions: Cervical, Fall Precaution Comments: Using BLE ACE wraps for BP control at this time. Monitor this Required Braces or Orthoses: Cervical Brace Cervical Brace: Hard collar, At all times Restrictions Weight Bearing Restrictions: No Vital Signs: Therapy Vitals Temp: 98.3 F (36.8 C) Temp Source: Oral Pulse Rate: 98 Resp: 16 BP: (!) 82/52 mmHg Patient Position (if appropriate): Standing Oxygen Therapy SpO2: 97 % O2 Device: Not Delivered Pain: Pain Assessment Pain Assessment: 0-10 Pain Score: 5  Pain Type: Acute pain;Surgical pain Pain Location: Neck Pain Descriptors / Indicators: Aching Pain Onset: On-going Pain Intervention(s): Rest;Repositioned Multiple Pain Sites: No   Function:  Toileting Toileting             Bed Mobility Roll left and right activity     Sit to lying activity        Lying to sitting activity     Mobility details    Transfers Sit to stand transfer   Sit to stand assist level: Total assist (Pt < 25%) Sit to stand assistive device: Other (standing frame)  Chair/bed transfer        Therapist, art Ambulation          Walk 10  feet activity      Walk 50 feet with 2 turns activity      Walk 150 feet activity      Walk 10 feet on uneven surfaces activity      Stairs          Walk up/down 1 step activity        Walk up/down 4 steps activity      Walk up/down 12 steps activity      Pick up small objects from floor      Wheelchair   Type: Manual Max wheelchair distance: 60 Assist Level: Moderate assistance (Pt 50 - 74%)  Wheel 50 feet with 2 turns activity      Wheel 150 feet activity       Cognition Comprehension Comprehension assist level: Follows complex conversation/direction with no assist  Expression Expression assist level: Expresses complex ideas: With no assist   Social Interaction Social Interaction assist level: Interacts appropriately with others with medication or extra time (anti-anxiety, antidepressant).  Problem Solving Problem solving assist level: Solves complex problems: Recognizes & self-corrects  Memory Memory assist level: More than reasonable amount of time    Therapy/Group: Individual Therapy with [employer], PT Tech as +2  Ronette Deter M 11/29/2014, 3:23 PM

## 2014-11-29 NOTE — Progress Notes (Signed)
Occupational Therapy Session Note  Patient Details  Name: Carl [name] MRN: [id number] Date of Birth: 11/14/1950  Today's Date: 11/29/2014 OT Individual Time: 830-930 and 1300-1400 OT Individual Time Calculation (min): 60 min and 60 min   Short Term Goals: Week 2:  OT Short Term Goal 1 (Week 2): Pt will thread B UEs into shirt in order to increase independence with ADLs OT Short Term Goal 2 (Week 2): Pt will place toothpaste on toothbrush with set-up to open container during grooming task to increase independence with ADLs OT Short Term Goal 3 (Week 2): Pt will manipulate water cup and bring to mouth independently in order to increase independence with ADLs.   Skilled Therapeutic Interventions/Progress Updates:    Session One: Pt seen for OT ADL bathing and dressing session. Pt in sidelying upon arrival, set-up in position for BM as part of establishment of bowel program. Pt agreeable to bathing/dressing session. Bathing completed at bed level. See below for level of assist required. He required increased assist to wash R UE due to decreased strength/ control of L UE. Pt used hospital bed functions to sit upright in bed and LEs placed in circle sit position. Pt able to wash tops of B LEs when in circle sit position. He required assist to wash B feet due to strain in neck/ shoulders when attempting to reach feet. Pt able to bring R LE into straight position from circle sit position with cues for technique demonstrating increased movement and control of R LE. With increased time, pt able to thread B UEs into shirt, an improvement from previous session. Pt requested return to sidelying at end of session in hopes of having BM. Pt left in sidelying position with all needs in reach.   Session Two: Pt seen for OT session focusing on ADL re-training and neuromuscular re-education. Pt sitting up in w/c upon arrival, waiting to eat lunch. He demonstrated ability to don universal cuff with increased time and  cues. He was also able to pick up standard class from table, using B UEs together and drink from cup without assist- an improvement from previous sessions. Pt then completed grooming tasks set-up at sink. Focus on pt opening and manipulating self care items including removing twist cap and pouring mouthwash into cup. Pt brushed teeth and with increased time and effort, able to come forward off back of w/c to spit in sink.  Pt taken to therapy day room, where he completed towel pushes on raised table top with emphasis on weight bearing through B UEs and core strength/ stability of coming forward and backward off back of chair in prep for functional sitting balance and transfers. Pt returned to room at end of session, left sitting in w/c with all needs in reach. Pt very attentive to boosting schedule, verbalizing need to be boosted during session. Pt and caregiver educated regarding bowel program, incomplete SCI, OT goals, POC, and d/c planning.   Therapy Documentation Precautions:  Precautions Precautions: Cervical, Fall Precaution Comments: Using BLE ACE wraps for BP control at this time. Monitor this Required Braces or Orthoses: Cervical Brace Cervical Brace: Hard collar, At all times Restrictions Weight Bearing Restrictions: No Vital Signs:  Pain: Pain Assessment Pain Assessment: No/denies pain  Function:   Eating Eating   Eating Assist Level: Set up assist for;More than reasonable amount of time   Eating Set Up Assist For: Cutting food       Grooming Oral Care,Brush Teeth, Clean Dentures Activity:  Assist Level: Assistive device;Set up Assistive Device Comment: Set-up with built up gripper on toothbrush Set up : To apply toothpaste  Wash, Rinse, Dry Face Activity          Wash, Rinse, Dry Hands Activity          Brush, Comb Hair Activity        Shave Activity          Apply Makeup Activity                                                              Bathing Bathing position   Position: Bed  Bathing parts Body parts bathed by patient: Right arm;Left arm;Chest;Abdomen;Left upper leg Body parts bathed by helper: Back;Left lower leg;Right lower leg;Right arm  Bathing assist   Assistive Device Comment: Hospital bed functions     Upper Body Dressing/Undressing Upper body dressing   What is the patient wearing?: Pull over shirt/dress     Pull over shirt/dress - Perfomed by patient: Thread/unthread right sleeve;Thread/unthread left sleeve;Put head through opening Pull over shirt/dress - Perfomed by helper: Pull shirt over trunk        Upper body assist         Lower Body Dressing/Undressing Lower body dressing   What is the patient wearing?: Ted Hose;Socks           Non-skid slipper socks- Performed by helper: Don/doff right sock;Don/doff left sock               TED Hose - Performed by helper: Don/doff right TED hose;Don/doff left TED hose  Lower body assist          Bed Mobility Roll left and right activity   Assist level: Max assist (Pt 25 - 49%)  Sit to lying activity      Lying to sitting activity   Assist level: Total assist (Pt < 25%)  Mobility details Bed mobility details: Tactile cues for initiation;Tactile cues for weight shifting;Tactile cues for placement;Tactile cues for sequencing;Verbal cues for techniques;Verbal cues for sequencing;Manual facilitation for weight shifting   Transfers Sit to stand transfer        Chair/bed transfer   Chair/bed transfer method: Lateral scoot Chair/bed transfer assist level: Total assist (Pt < 25%) Chair/bed transfer assistive device: Sliding board;Armrests;Orthosis;Other (leg loops)   Chair/bed transfer details: Tactile cues for sequencing;Tactile cues for posture;Tactile cues for weight shifting;Tactile cues for placement;Verbal cues for sequencing;Verbal cues for technique;Verbal cues for precautions/safety;Verbal cues for safe use of DME/AE;Manual  facilitation for weight shifting  Toilet transfer                Tub/shower transfer                Therapy/Group: Individual Therapy  Lewis, Jasmon Mattice C 11/29/2014, 3:04 PM

## 2014-11-29 NOTE — Progress Notes (Signed)
Physical Therapy Session Note  Patient Details  Name: Carl [name] MRN: [id number] Date of Birth: Jul 25, 1950  Today's Date: 11/29/2014 PT Individual Time: 1100-1200 PT Individual Time Calculation (min): 60 min   Short Term Goals: Week 2:  PT Short Term Goal 1 (Week 2): Pt will perform bed mobility rolling and supine <> sit with use of leg loops and max A of one person PT Short Term Goal 2 (Week 2): Pt will perform slideboard transfer bed <> chair on level surface with max A of one person and use of leg loops PT Short Term Goal 3 (Week 2): Pt will trial and perform w/c mobility in manual w/c x 50' with mod A  PT Short Term Goal 4 (Week 2): Pt will tolerate sitting up OOB x 4-6 hours and be independent with requesting pressure relief every 30 minutes PT Short Term Goal 5 (Week 2): Pt will tolerate standing in frame x 5 minutes for WB and core strengthening during dynamic UE activity  Skilled Therapeutic Interventions/Progress Updates:   Pt received in bed; pt reporting not being able to successfully evacuate bowels this am during OT session.  Pt still unable to sense urge to urinate or have BM.  Assisted pt with donning ace wraps, pants and leg loops in supine while discussing home set up, possible equipment needs and what his wife had been educated on so far.  Performed rolling to L and R as below with pt providing verbal cues to therapist for sequencing to pull up pants and don abdominal binder; BP recorded in supine.  Performed bed mobility as below with pt use of leg loops and use of HOB elevated; pt able to advance each LE to EOB and pull to sitting with max-total A of one person.  Re-assessed BP in sitting.  Transitioned to w/c with slideboard as below.  In gym pt engaged in NMR seated in w/c; see below.  At end of session pt returned to room with family present to assist with lunch and pressure relief.    Therapy Documentation Precautions:  Precautions Precautions: Cervical,  Fall Precaution Comments: Using BLE ACE wraps for BP control at this time. Monitor this Required Braces or Orthoses: Cervical Brace Cervical Brace: Hard collar, At all times Restrictions Weight Bearing Restrictions: No Vital Signs: Therapy Vitals Patient Position (if appropriate): Orthostatic Vitals Pain: Pain Assessment Pain Assessment: No/denies pain Other Treatments: Treatments Neuromuscular Facilitation: Right;Left;Upper Extremity;Activity to increase coordination;Activity to increase motor control;Activity to increase timing and sequencing;Activity to increase sustained activation to focus on grip with each UE to grasp red clothes pins (use of tenodesis on RUE) and focus on trunk control/activation of core and dynamic sitting balance while reaching to place clothes pins on target x 2-3 reps each UE.    Function:  Bed Mobility Roll left and right activity   Assist level: Max assist (Pt 25 - 49%) Roll left and right assistive device: Bedrails;Other (leg loops)  Sit to lying activity        Lying to sitting activity   Assist level: Total assist (Pt < 25%) Lying to sitting assistive device: Bedrails;HOB elevated;Other (leg loops)  Mobility details Bed mobility details: Tactile cues for initiation;Tactile cues for weight shifting;Tactile cues for placement;Tactile cues for sequencing;Verbal cues for techniques;Verbal cues for sequencing;Manual facilitation for weight shifting   Transfers Sit to stand transfer        Chair/bed transfer   Chair/bed transfer method: Lateral scoot Chair/bed transfer assist level: Total assist (Pt < 25%)  Chair/bed transfer assistive device: Sliding board;Armrests;Orthosis;Other (leg loops)   Chair/bed transfer details: Tactile cues for sequencing;Tactile cues for posture;Tactile cues for weight shifting;Tactile cues for placement;Verbal cues for sequencing;Verbal cues for technique;Verbal cues for precautions/safety;Verbal cues for safe use of  DME/AE;Manual facilitation for weight shifting   Toilet transfer                Car transfer          Locomotion Ambulation          Walk 10 feet activity      Walk 50 feet with 2 turns activity      Walk 150 feet activity      Walk 10 feet on uneven surfaces activity      Stairs          Walk up/down 1 step activity        Walk up/down 4 steps activity      Walk up/down 12 steps activity      Pick up small objects from floor      Wheelchair  Total A in tilt in space        Wheel 50 feet with 2 turns activity  Total A    Wheel 150 feet activity  Total A      Therapy/Group: Individual Therapy  Edman Circle [facility] 11/29/2014, 1:09 PM

## 2014-11-29 NOTE — Patient Care Conference (Signed)
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 C6 spinal cord injury 11/20/2014  . Neurogenic bowel 11/20/2014  . Neurogenic bladder 11/20/2014  . Fever of unknown origin   . Quadriplegia 11/14/2014  . C5 vertebral fracture 11/09/2014    Expected Discharge Date: Expected Discharge Date: 12/19/14  Team Members Present: Physician leading conference: Dr. Faith Rogue Social Worker Present: Amada Jupiter, LCSW Nurse Present: Other (comment) Cheri Guppy, RN) PT Present: Edman Circle, PT OT Present: Roney Mans, OT;Other (comment) Johnsie Cancel, OT) SLP Present: Feliberto Gottron, SLP PPS Coordinator present : Tora Duck, RN, CRRN     Current Status/Progress Goal Weekly Team Focus  Medical   bp's low. volume repleted. voiding re-trial  see prior, increased oob  see prior. add meds for bp mgt   Bowel/Bladder   incontinent of bowel, foley catheter   continent of bowel and bladder with max assist  offer toileting q2h, maintain bowel program, continue foley care   Swallow/Nutrition/ Hydration             ADL's   Max A bathing and dressing in supine; +2 functional transfers; set-up for eating and grooming  Min A overall  Functional transfers; bathing and dressing; Sitting balance/ endurance.    Mobility   Continues to require total A for slideboard transfers, bed mobility, w/c mobility and standing frame for standing   min-mod A w/c level  sitting balance/postural control, OOB tolerance, UE strengthening and LE strengthening/WB in standing frame   Communication             Safety/Cognition/ Behavioral Observations            Pain   no complaitns of pain, scheduled tramadol  pain oless than or equal to 4 on a scale of 0-10  assess for pain q4h, medicate if indicated   Skin   incidion to anterior neck (OTA), no other skgns of breakdown/injury  no new skin injury/breakdown  assess pain q shift, keep skin clean and dry    Rehab Goals Patient on target to meet rehab goals: Yes *See Care Plan and progress notes for long and short-term goals.  Barriers to Discharge: ongoing neuro deficits    Possible Resolutions to Barriers:  family ed, scheduled bowel/bladder program    Discharge Planning/Teaching Needs:  home with wife to provide 24/7 assistance      Team Discussion:  Displaying some return in LE, wounds healing nicely.  Renal improved and foley out today.  Low BP concerns - adjust meds in addition to other strategies.  Need to continue to work on b/b program.  Affect remains flat, however, pt does not endorse any depressive symptoms to staff.  Will continue to monitor this.  Revisions to Treatment Plan:  None   Continued Need for Acute Rehabilitation Level of Care: The patient requires daily medical management by a physician  with specialized training in physical medicine and rehabilitation for the following conditions: Daily direction of a multidisciplinary physical rehabilitation program to ensure safe treatment while eliciting the highest outcome that is of practical value to the patient.: Yes Daily medical management of patient stability for increased activity during participation in an intensive rehabilitation regime.: Yes Daily analysis of laboratory values and/or radiology reports with any subsequent need for medication adjustment of medical intervention for : Post surgical problems;Neurological  problems  Carl [name] 11/29/2014, 12:48 PM

## 2014-11-30 ENCOUNTER — Inpatient Hospital Stay (HOSPITAL_COMMUNITY): Payer: Federal, State, Local not specified - PPO | Admitting: Physical Therapy

## 2014-11-30 ENCOUNTER — Inpatient Hospital Stay (HOSPITAL_COMMUNITY): Payer: Federal, State, Local not specified - PPO | Admitting: Occupational Therapy

## 2014-11-30 DIAGNOSIS — I951 Orthostatic hypotension: Secondary | ICD-10-CM

## 2014-11-30 MED ORDER — MIDODRINE HCL 5 MG PO TABS
5.0000 mg | ORAL_TABLET | Freq: Two times a day (BID) | ORAL | Status: DC
  Administered 2014-11-30 – 2014-12-04 (×8): 5 mg via ORAL
  Filled 2014-11-30 (×10): qty 1

## 2014-11-30 NOTE — Progress Notes (Signed)
[place]     PROGRESS NOTE    Subjective/Complaints: Notes some tingling/burning in LUE--not too severe.    ROS: Pt denies  , rash/itching, headache, blurred or double vision, nausea, vomiting, abdominal pain, diarrhea, chest pain, shortness of breath, palpitations, dysuria, dizziness,  back pain, bleeding, anxiety, or depression    Objective: Vital Signs: Blood pressure 109/53, pulse 44, temperature 98.3 F (36.8 C), temperature source Oral, resp. rate 16, height  (1.727 m), weight 77.61 kg (171 lb 1.6 oz), SpO2 95 %. No results found. No results for input(s): WBC, HGB, HCT, PLT in the last 72 hours. No results for input(s): NA, K, CL, GLUCOSE, BUN, CREATININE, CALCIUM in the last 72 hours.  Invalid input(s): CO CBG (last 3)  No results for input(s): GLUCAP in the last 72 hours.  Wt Readings from Last 3 Encounters:  11/29/14 77.61 kg (171 lb 1.6 oz)  11/16/14 90.719 kg (200 lb)    Physical Exam:  Constitutional: He is oriented to person, place, and time. He appears well-developed and well-nourished.  HENT:  Head: Normocephalic and atraumatic.  Healing abrasion on face/right eyelid  Eyes: Conjunctivae are normal. Pupils are equal, round, and reactive to light.  Neck:  C Collar.  Cardiovascular: Normal rate and regular rhythm.  Respiratory: Effort normal. No respiratory distress. He has no wheezes.  GI: Soft. Bowel sounds +.   Genitourinary: foley out Musculoskeletal: He exhibits tenderness. He exhibits no edema.  Neurological: He is alert and oriented to person, place, and time. No cranial nerve deficit. Coordination normal.  Patient has normal sensation proximal to C6 dermatome. Patient has reduced sensation to pinprick but is able to identify the area touched at bilateral C6 through sacral levels Patient has decreased tone in bilateral Lower extremities 4/5 bilateral deltoids, biceps, 4- triceps, 4 wrist extensors, trace  right HI, 1+ to 2- left HI, right HF, KE and ADF/APF are 1/5. 0/5 left hip flexors and knee extensors ankle dorsiflexors and plantar flexors  Skin: Skin is warm and dry.  Cervical incision intact   Assessment/Plan: 1. 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 Physiatrist is providing close team supervision and 24 hour management of active medical problems listed below. Physiatrist and rehab team continue to assess barriers to discharge/monitor patient progress toward functional and medical goals.  FIM: Function - Bathing Position: Bed Body parts bathed by patient: Right arm, Left arm, Chest, Abdomen, Left upper leg Body parts bathed by helper: Back, Left lower leg, Right lower leg, Right arm Assist Level: 2 helpers, Assistive device Assistive Device Comment: Hospital bed functions  Function- Upper Body Dressing/Undressing What is the patient wearing?: Pull over shirt/dress Pull over shirt/dress - Perfomed by patient: Thread/unthread right sleeve, Thread/unthread left sleeve, Put head through opening Pull over shirt/dress - Perfomed by helper: Pull shirt over trunk Assist Level: 2 helpers Set up : To obtain clothing/put away Function - Lower Body Dressing/Undressing What is the patient wearing?: Faythe Dingwall, Socks Position: Bed Pants- Performed by helper: Thread/unthread right pants leg, Thread/unthread left pants leg, Pull pants up/down Non-skid slipper socks- Performed by helper: Don/doff right sock, Don/doff left sock Socks - Performed by helper: Don/doff right sock, Don/doff left sock Shoes - Performed by helper: Don/doff right shoe, Don/doff left shoe, Fasten right, Fasten left TED Hose - Performed by helper: Don/doff right TED hose, Don/doff left TED hose Assist Level: 2  Helpers  Function - Interior and spatial designer activity did not occur: No continent bowel/bladder  event Toileting steps completed by helper: Adjust clothing prior to toileting, Performs perineal hygiene, Adjust clothing after toileting Assist level: Two helpers  Function - Archivist transfer activity did not occur: Safety/medical concerns  Function - Chair/bed transfer Chair/bed transfer method: Lateral scoot Chair/bed transfer assist level: Total assist (Pt < 25%) Chair/bed transfer assistive device: Sliding board, Armrests, Orthosis, Other (leg loops) Chair/bed transfer details: Tactile cues for sequencing, Tactile cues for posture, Tactile cues for weight shifting, Tactile cues for placement, Verbal cues for sequencing, Verbal cues for technique, Verbal cues for precautions/safety, Verbal cues for safe use of DME/AE, Manual facilitation for weight shifting  Function - Locomotion: Wheelchair Will patient use wheelchair at discharge?: Yes Type: Manual Max wheelchair distance: 60 Assist Level: Moderate assistance (Pt 50 - 74%) Assist Level: Dependent (Pt equals 0%) Assist Level: Dependent (Pt equals 0%) Function - Locomotion: Ambulation Ambulation activity did not occur: Safety/medical concerns Walk 10 feet activity did not occur: Safety/medical concerns Walk 50 feet with 2 turns activity did not occur: Safety/medical concerns Walk 150 feet activity did not occur: Safety/medical concerns Walk 10 feet on uneven surfaces activity did not occur: Safety/medical concerns  Function - Comprehension Comprehension: Auditory Comprehension assist level: Follows complex conversation/direction with no assist  Function - Expression Expression: Verbal Expression assist level: Expresses complex ideas: With no assist  Function - Social Interaction Social Interaction assist level: Interacts appropriately with others with medication or extra time (anti-anxiety, antidepressant).  Function - Problem Solving Problem solving assist level: Solves complex problems: Recognizes &  self-corrects  Function - Memory Memory assist level: More than reasonable amount of time Patient normally able to recall (first 3 days only): Location of own room, Staff names and faces, That he or she is in a hospital, Current season Medical Problem List and Plan: 1. 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 DVT Prophylaxis/Anticoagulation: Pharmaceutical: Lovenox to continue  -dopplers negative 3. Pain Management: Continue Lyrica BID for neuropathic pain.tramdol for mod and oxy for severe pain 4. Mood: Team to provide ego support.  .    -continue to monitor clinically. neuropsych consult appreciated 5. Neuropsych: This patient is capable of making decisions on his own behalf. 6. Skin/Wound Care: Routine pressure relief measures.  7. Fluids/Electrolytes/Nutrition: 100% meals. Pushing fluids 8. ZOX:WRUEAVWU negatie. afebrile  -ucx + for klebsiella oxytoca--continue septra through today   9. CV: abd binder/compression stockings to maintain sitting/upright bp, check orthostatic vitals  -does not require pharmaceutical rx at present  -encourage fluids  -bp's still on the low end despite midodrine, increase to 0.5mg   -limit pain meds (as possible) 10. Acutre renal failure---volume depletion--resolved   - push po  - Bun/cr normal on most recent labs 11. Neurogenic bladder---I/O caths---no urge or sense of bladder fullness yet  -continue to keep volumes 300-500cc 12. Neurogenic bowel: qam bowel program improving in consistency  LOS (Days) 10 A FACE TO FACE EVALUATION WAS PERFORMED  SWARTZ,ZACHARY T 11/30/2014 8:08 AM

## 2014-11-30 NOTE — Progress Notes (Signed)
Occupational Therapy Session Note  Patient Details  Name: Carl [name] MRN: [id number] Date of Birth: July 16, 1950  Today's Date: 11/30/2014 OT Individual Time:0830-1000 1400-1500 OT Individual Time Calculation (min): 60 min    Short Term Goals: Week 2:  OT Short Term Goal 1 (Week 2): Pt will thread B UEs into shirt in order to increase independence with ADLs OT Short Term Goal 2 (Week 2): Pt will place toothpaste on toothbrush with set-up to open container during grooming task to increase independence with ADLs OT Short Term Goal 3 (Week 2): Pt will manipulate water cup and bring to mouth independently in order to increase independence with ADLs.   Skilled Therapeutic Interventions/Progress Updates:    Session One: Pt seen for OT ADL bathing and dressing session. Pt in supine upon arrival, voicing desire to complete shaving task. Pt set-up completely flat in bed, cervical collar removed, and shaving completed at total A level. Cervical pads cleaned and changed with education provided regarding cleaning and caring for pads. Will cont to educate pt's wife when she is available. Pt completed bathing/ dressing session at supine level. +2 assist available to assist with bed mobility. Pt demonstrated increased ROM and grasping ability to wash B UEs and to manipulate cap on deodorant bottle. Pt unable to twist cap off standard bottle of deodorant, however, when Koban applied around bottle for built up textured surface, pt able to open independently. Assist provided to put deodorant on R LE due to decreased strength/ ROM in wrist on R UE. Pt completed log rolling to B sides to don pants. Pt voiced dizziness when rolling to R side, will cont. To monitor and request vestibular eval. Pt left in supine at end of session, all needs in reach.   Session Two: Pt seen for OT therapy focusing on ADL re-training and UE ROM. Pt successfully set-up with hand free telophone device, now he is able to call wife  independently. Wife and pt educated regarding use and return demonstrated ability to use device independently. He then completed oral care seated in w/c at sink. With increased time, pt able to manage all small caps and demonstrated functional in hand manipulation to complete oral care task. Completed shaving/ haircutting task which was started in previous session, total assist required due to decrease ROM secondary to cervical precautions and impaired use of B UEs.  Pt then completed ball squeeze task, holding and releasing beach ball from between him legs demonstrating muscle activation and control in B LEs. He then completed ball toss activity with emphasis on B UEs control and movement. Pt left sitting up in w/c at end of session, all needs in reach. Pt provided with music boombox, and music set to pt's request in order to aid with coping and relaxation.  He verbalized need throughout session for pressure relief every 30 minutes using timer to manage time.  Therapy Documentation Precautions:  Precautions Precautions: Cervical, Fall Precaution Comments: Using BLE ACE wraps for BP control at this time. Monitor this Required Braces or Orthoses: Cervical Brace Cervical Brace: Hard collar, At all times Restrictions Weight Bearing Restrictions: No Pain: Pain Assessment Pain Assessment: 0-10 Pain Score: 3  Pain Type: Acute pain (possible median nerve pain in L forearm) Pain Location: Arm Pain Orientation: Distal Pain Descriptors / Indicators: Aching;Sore Pain Onset: With Activity Pain Intervention(s): Ambulation/increased activity;Repositioned Multiple Pain Sites: Yes 2nd Pain Site Pain Score: 2 Pain Type: Acute pain Pain Location: Generalized Pain Descriptors / Indicators: Sore Pain Onset: Gradual Pain Intervention(s):  Repositioned   Function:   Eating Eating   Eating Assist Level: Set up assist for;More than reasonable amount of time           Grooming Oral Care,Brush  Teeth, Clean Dentures Activity:      Assist Level: Assistive device;Set up Assistive Device Comment: Set-up with built up gripper on toothbrush    Wash, Rinse, Dry Face Activity          Wash, Rinse, Dry Hands Activity          Brush, Comb Hair Activity   Assist Level: Helper performed activity    Shave Activity   Assist Level: Helper performed activity      Apply Makeup Activity                                                             Bathing Bathing position   Position: Bed  Bathing parts Body parts bathed by patient: Right arm;Left arm;Chest;Abdomen;Left upper leg;Right upper leg Body parts bathed by helper: Buttocks;Back;Left lower leg;Right lower leg  Bathing assist Assist Level: 2 helpers;Assistive device Assistive Device Comment: Hospital bed functions     Upper Body Dressing/Undressing Upper body dressing   What is the patient wearing?: Pull over shirt/dress     Pull over shirt/dress - Perfomed by patient: Thread/unthread right sleeve;Thread/unthread left sleeve Pull over shirt/dress - Perfomed by helper: Put head through opening;Pull shirt over trunk        Upper body assist Assist Level: 2 helpers   Set up : To obtain clothing/put away   Lower Body Dressing/Undressing Lower body dressing   What is the patient wearing?: Ted Hose;Non-skid slipper socks       Pants- Performed by helper: Thread/unthread right pants leg;Thread/unthread left pants leg;Pull pants up/down   Non-skid slipper socks- Performed by helper: Don/doff right sock;Don/doff left sock               TED Hose - Performed by helper: Don/doff right TED hose;Don/doff left TED hose  Lower body assist         Toileting Toileting          Toileting assist      Bed Mobility Roll left and right activity   Assist level: Max assist (Pt 25 - 49%)  Sit to lying activity   Assist level: Max assist (Pt 25 - 49%)  Lying to sitting activity      Mobility details Bed mobility  details: Verbal cues for techniques;Verbal cues for safe use of DME/AE;Manual facilitation for placement;Manual facilitation for weight shifting   Transfers Sit to stand transfer        Chair/bed transfer   Chair/bed transfer method: Lateral scoot Chair/bed transfer assist level: 2 helpers Chair/bed transfer assistive device: Sliding board;Orthosis (leg loops)   Chair/bed transfer details: Verbal cues for safe use of DME/AE;Manual facilitation for weight shifting;Manual facilitation for placement;Verbal cues for technique;Verbal cues for sequencing  Toilet transfer                Tub/shower transfer             Cognition Comprehension Comprehension assist level: Follows complex conversation/direction with no assist  Expression Expression assist level: Expresses complex ideas: With no assist  Social Interaction Social Interaction assist level: Interacts appropriately with  others with medication or extra time (anti-anxiety, antidepressant).  Problem Solving Problem solving assist level: Solves complex problems: Recognizes & self-corrects  Memory Memory assist level: More than reasonable amount of time    Therapy/Group: Individual Therapy  Lewis, Rashad Auld C 11/30/2014, 3:14 PM

## 2014-11-30 NOTE — Progress Notes (Signed)
Physical Therapy Session Note  Patient Details  Name: Carl [name] MRN: [id number] Date of Birth: Aug 13, 1950  Today's Date: 11/30/2014 PT Individual Time: 1100-1200 PT Individual Time Calculation (min): 60 min   Short Term Goals: Week 2:  PT Short Term Goal 1 (Week 2): Pt will perform bed mobility rolling and supine <> sit with use of leg loops and max A of one person PT Short Term Goal 2 (Week 2): Pt will perform slideboard transfer bed <> chair on level surface with max A of one person and use of leg loops PT Short Term Goal 3 (Week 2): Pt will trial and perform w/c mobility in manual w/c x 50' with mod A  PT Short Term Goal 4 (Week 2): Pt will tolerate sitting up OOB x 4-6 hours and be independent with requesting pressure relief every 30 minutes PT Short Term Goal 5 (Week 2): Pt will tolerate standing in frame x 5 minutes for WB and core strengthening during dynamic UE activity  Skilled Therapeutic Interventions/Progress Updates:  Treatment Session 1:   Pt received in bed with c-collar and thigh high TEDs in place - agreeable to PT session, but continuing to c/o L arm (mostly forearm) soreness. Therapeutic Activity - see below for details. PT placed abdominal binder and thigh high TEDS on prior to transfer oob. During rolling to pt's R, pt c/o dizziness and a few beats of nystagmus noted, but so few that PT had difficult time discerning direction (possibly horizontal canal). PT explained vestibular specialist can examine pt, but would be limited with intervention due to c-collar. Pt able to correctly state head-hips relationship, but guards L arm and minimally uses it due to pain during scoot transfer. Therapeutic Exercise - PT introduces STOMPS protocol for ROM and gentle strengthening (strengthening and optimization of use for painful shoulder): doorway anterior shoulder capsule/pect stretch, posterior capsule stretch (AAROM from PT): 3 x 15 seconds each. "Empty can" arm elevation in scaption,  arm ER with elbow by side: 3 x 5 reps each. Pt ended up in w/c after pressure relief with family present, in room, awaiting lunch and cathing.   Treatment Session 2: Pt received up in w/c - agreeable to PT. W/C Propulsion - see below for details. Pt initially req min A, progressing to mod A with fatigue. Neuromuscular Reeducation - PT places pt in standing frame and instructs him in isometric gluteal/quad strengthening activity x 10 reps, progressing to mini-squats in standing frame with PT manual assist x 10 reps. PT instructs pt in postural reeducation activity while up in w/c using arms to pull trunk forward and focus on trunk extension towards neutral x 10 reps - pt began having a BM after last rep of this exercise and so pt brought back to room and RN notified, agreeing to transfer pt back to bed and clean him up. Continue per PT POC.   Therapy Documentation Precautions:  Precautions Precautions: Cervical, Fall Precaution Comments: Using BLE ACE wraps for BP control at this time. Monitor this Required Braces or Orthoses: Cervical Brace Cervical Brace: Hard collar, At all times Restrictions Weight Bearing Restrictions: No Pain: Pain Assessment Pain Assessment: 0-10 Pain Score: 6  Pain Type: Acute pain (possible median nerve pain in L forearm) Pain Location: Arm Pain Orientation: Distal Pain Descriptors / Indicators: Aching;Sore Pain Onset: With Activity Pain Intervention(s): Rest;Other (Comment) (stretching) Multiple Pain Sites: Yes 2nd Pain Site Pain Score: 2 Pain Type: Acute pain Pain Location: Generalized Pain Descriptors / Indicators: Sore Pain Onset:  Gradual Pain Intervention(s): Repositioned Treatment Session 2: No c/o pain.   Function:  Toileting Toileting             Bed Mobility Roll left and right activity   Assist level: Max assist (Pt 25 - 49%) Roll left and right assistive device: Bedrails  Sit to lying activity   Assist level: Max assist (Pt 25 -  49%) Sit to lying assistive device: Bedrails  Lying to sitting activity        Mobility details Bed mobility details: Verbal cues for techniques;Verbal cues for safe use of DME/AE;Manual facilitation for placement;Manual facilitation for weight shifting   Transfers Sit to stand transfer        Chair/bed transfer   Chair/bed transfer method: Lateral scoot Chair/bed transfer assist level: 2 helpers Chair/bed transfer assistive device: Sliding board;Orthosis (leg loops)   Chair/bed transfer details: Verbal cues for safe use of DME/AE;Manual facilitation for weight shifting;Manual facilitation for placement;Verbal cues for technique;Verbal cues for sequencing   Toilet transfer                Car transfer          Locomotion Ambulation          Walk 10 feet activity      Walk 50 feet with 2 turns activity      Walk 150 feet activity      Walk 10 feet on uneven surfaces activity      Stairs          Walk up/down 1 step activity        Walk up/down 4 steps activity      Walk up/down 12 steps activity      Pick up small objects from floor      Wheelchair   Type: Manual Max wheelchair distance: 100 Assist Level: Moderate assistance (Pt 50 - 74%)  Wheel 50 feet with 2 turns activity   Assist Level: Moderate assistance (Pt 50 - 74%)  Wheel 150 feet activity       Cognition Comprehension Comprehension assist level: Follows complex conversation/direction with no assist  Expression Expression assist level: Expresses complex ideas: With no assist  Social Interaction Social Interaction assist level: Interacts appropriately with others with medication or extra time (anti-anxiety, antidepressant).  Problem Solving Problem solving assist level: Solves complex problems: Recognizes & self-corrects  Memory Memory assist level: More than reasonable amount of time    Therapy/Group: Individual Therapy  Wally Behan M 11/30/2014, 12:18 PM

## 2014-12-01 ENCOUNTER — Inpatient Hospital Stay (HOSPITAL_COMMUNITY): Payer: Federal, State, Local not specified - PPO | Admitting: Occupational Therapy

## 2014-12-01 ENCOUNTER — Inpatient Hospital Stay (HOSPITAL_COMMUNITY): Payer: Federal, State, Local not specified - PPO | Admitting: Physical Therapy

## 2014-12-01 DIAGNOSIS — I951 Orthostatic hypotension: Secondary | ICD-10-CM

## 2014-12-01 MED ORDER — MECLIZINE HCL 25 MG PO TABS: 25.0000 mg | ORAL_TABLET | Freq: Three times a day (TID) | ORAL | Status: DC | PRN

## 2014-12-01 NOTE — Progress Notes (Signed)
[facility] PHYSICAL MEDICINE & REHABILITATION     PROGRESS NOTE    Subjective/Complaints: Bowels still not regulated. Had some dizziness (vertigo" while in therapy yesterday. bp's seemed better   ROS: Pt denies  , rash/itching, headache, blurred or double vision, nausea, vomiting, abdominal pain, diarrhea, chest pain, shortness of breath, palpitations, dysuria, dizziness,  back pain, bleeding, anxiety, or depression    Objective: Vital Signs: Blood pressure 105/56, pulse 45, temperature 98 F (36.7 C), temperature source Oral, resp. rate 18, height 5\' 8"  (1.727 m), weight 77.61 kg (171 lb 1.6 oz), SpO2 96 %. No results found. No results for input(s): WBC, HGB, HCT, PLT in the last 72 hours. No results for input(s): NA, K, CL, GLUCOSE, BUN, CREATININE, CALCIUM in the last 72 hours.  Invalid input(s): CO CBG (last 3)  No results for input(s): GLUCAP in the last 72 hours.  Wt Readings from Last 3 Encounters:  11/29/14 77.61 kg (171 lb 1.6 oz)  11/16/14 90.719 kg (200 lb)    Physical Exam:  Constitutional: He is oriented to person, place, and time. He appears well-developed and well-nourished.  HENT:  Head: Normocephalic and atraumatic.  Nearly healed abrasion on face/right eyelid  Eyes: Conjunctivae are normal. Pupils are equal, round, and reactive to light.  Neck:  C Collar.  Cardiovascular: Normal rate and regular rhythm.  Respiratory: Effort normal. No respiratory distress. He has no wheezes.  GI: Soft. Bowel sounds +.   Genitourinary: foley out Musculoskeletal: He exhibits tenderness. He exhibits no edema.  Neurological: He is alert and oriented to person, place, and time. No cranial nerve deficit. Coordination normal.  Patient has normal sensation proximal to C6 dermatome. Patient has reduced sensation to pinprick but is able to identify the area touched at bilateral C6 through sacral levels Patient has decreased tone in bilateral Lower extremities 4/5 bilateral  deltoids, biceps, 4- triceps, 4 wrist extensors, trace right HI, 1+ to 2- left HI, right HF, KE and ADF/APF are 1 to 2-/5. Tr 1/5 left hip flexors and knee extensors ankle dorsiflexors and plantar flexors  Skin: Skin is warm and dry.  Cervical incision intact   Assessment/Plan: 1. 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 Physiatrist is providing close team supervision and 24 hour management of active medical problems listed below. Physiatrist and rehab team continue to assess barriers to discharge/monitor patient progress toward functional and medical goals.  FIM: Function - Bathing Position: Bed Body parts bathed by patient: Right arm, Left arm, Chest, Abdomen, Left upper leg, Right upper leg Body parts bathed by helper: Buttocks, Back, Left lower leg, Right lower leg Assist Level: 2 helpers, Assistive device Assistive Device Comment: Hospital bed functions  Function- Upper Body Dressing/Undressing What is the patient wearing?: Pull over shirt/dress Pull over shirt/dress - Perfomed by patient: Thread/unthread right sleeve, Thread/unthread left sleeve Pull over shirt/dress - Perfomed by helper: Put head through opening, Pull shirt over trunk Assist Level: 2 helpers Set up : To obtain clothing/put away Function - Lower Body Dressing/Undressing What is the patient wearing?: Ted Hose, Non-skid slipper socks Position: Bed Pants- Performed by helper: Thread/unthread right pants leg, Thread/unthread left pants leg, Pull pants up/down Non-skid slipper socks- Performed by helper: Don/doff right sock, Don/doff left sock Socks - Performed by helper: Don/doff right sock, Don/doff left sock Shoes - Performed by helper: Don/doff right shoe, Don/doff left shoe, Fasten right, Fasten left TED Hose -  Performed by helper: Don/doff right TED hose, Don/doff left TED hose Assist Level: 2  Helpers  Function - Toileting Toileting activity did not occur: No continent bowel/bladder event Toileting steps completed by helper: Adjust clothing prior to toileting, Performs perineal hygiene, Adjust clothing after toileting Assist level: Two helpers  Function - Archivist transfer activity did not occur: Safety/medical concerns  Function - Chair/bed transfer Chair/bed transfer method: Lateral scoot Chair/bed transfer assist level: 2 helpers Chair/bed transfer assistive device: Sliding board, Orthosis (leg loops) Chair/bed transfer details: Verbal cues for safe use of DME/AE, Manual facilitation for weight shifting, Manual facilitation for placement, Verbal cues for technique, Verbal cues for sequencing  Function - Locomotion: Wheelchair Will patient use wheelchair at discharge?: Yes Type: Manual Max wheelchair distance: 100 Assist Level: Moderate assistance (Pt 50 - 74%) Assist Level: Moderate assistance (Pt 50 - 74%) Assist Level: Dependent (Pt equals 0%) Function - Locomotion: Ambulation Ambulation activity did not occur: Safety/medical concerns Walk 10 feet activity did not occur: Safety/medical concerns Walk 50 feet with 2 turns activity did not occur: Safety/medical concerns Walk 150 feet activity did not occur: Safety/medical concerns Walk 10 feet on uneven surfaces activity did not occur: Safety/medical concerns  Function - Comprehension Comprehension: Auditory Comprehension assist level: Follows complex conversation/direction with no assist  Function - Expression Expression: Verbal Expression assist level: Expresses complex ideas: With no assist  Function - Social Interaction Social Interaction assist level: Interacts appropriately with others with medication or extra time (anti-anxiety, antidepressant).  Function - Problem Solving Problem solving assist level: Solves complex problems: Recognizes & self-corrects  Function - Memory Memory assist  level: More than reasonable amount of time Patient normally able to recall (first 3 days only): Location of own room, Staff names and faces, That he or she is in a hospital, Current season Medical Problem List and Plan: 1. 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 DVT Prophylaxis/Anticoagulation: Pharmaceutical: Lovenox to continue  -dopplers negative 3. Pain Management: Continue Lyrica BID for neuropathic pain.tramdol for mod and oxy for severe pain 4. Mood: Team to provide ego support.  .    -continue to monitor clinically. neuropsych consult appreciated 5. Neuropsych: This patient is capable of making decisions on his own behalf. 6. Skin/Wound Care: Routine pressure relief measures.  7. Fluids/Electrolytes/Nutrition: 100% meals. Pushing fluids 8. WFU:XNATFTDD negatie. afebrile  -ucx + for klebsiella oxytoca--continue septra through today   9. CV: abd binder/compression stockings to maintain sitting/upright bp, check orthostatic vitals  -does not require pharmaceutical rx at present  -encourage fluids  -bp's better on midodrine  0.5mg   -limit pain meds (as possible)  -some of dizziness may be vestibular---add antivert, treatment limited by collar 10. Acutre renal failure---volume depletion--resolved   - push po  - Bun/cr normal on most recent labs 11. Neurogenic bladder---I/O caths---no urge or sense of bladder fullness yet  -continue to keep volumes 300-500cc 12. Neurogenic bowel: qam bowel program improving in consistency  LOS (Days) 11 A FACE TO FACE EVALUATION WAS PERFORMED  SWARTZ,ZACHARY T 12/01/2014 8:11 AM

## 2014-12-01 NOTE — Progress Notes (Signed)
Physical Therapy Session Note  Patient Details  Name: Carl [name] MRN: [id number] Date of Birth: [birth year]  Today's Date: 12/01/2014 PT Individual Time: 0930-1040 and 1400-1450 PT Individual Time Calculation (min): 70 min and 50 min  Short Term Goals: Week 2:  PT Short Term Goal 1 (Week 2): Pt will perform bed mobility rolling and supine <> sit with use of leg loops and max A of one person PT Short Term Goal 2 (Week 2): Pt will perform slideboard transfer bed <> chair on level surface with max A of one person and use of leg loops PT Short Term Goal 3 (Week 2): Pt will trial and perform w/c mobility in manual w/c x 50' with mod A  PT Short Term Goal 4 (Week 2): Pt will tolerate sitting up OOB x 4-6 hours and be independent with requesting pressure relief every 30 minutes PT Short Term Goal 5 (Week 2): Pt will tolerate standing in frame x 5 minutes for WB and core strengthening during dynamic UE activity  Skilled Therapeutic Interventions/Progress Updates:   Received in w/c fully tilted for pressure relief after OT session.  Pt still c/o pain in L forearm; pt screened for precautions/contraindications and educated on use of TENS/heat for pain management and possible use of kinesiotape and requesting muscle rub cream if appropriate.  Pt agreeable to use of TENS + heat.  Pt set up with TENS unit over L wrist/finger extensors muscle group + disposable heat pack x 15 minutes while performing transfers and seated postural control.  Performed transfers as below for w/c <> mat with pt verbalizing 85-90% of sequence and pt assisting 15% with LE management with leg loops during lateral leans and slideboard placement.  On mat performed trunk control and sitting balance training with UE support on rolling table and therapist providing low back support/positioning with therapy ball with pt initiating from lower trunk/pelvis and maintaining balance/posture during dynamic UE movement and during trunk forwards and  backwards leaning.  Also performed propping on extended UE for balance without back support on mat while therapist set up w/c.  Once back in w/c performed adjustments to w/c back to improve trunk and pelvic position in w/c for increased lumbar support, decreased sacral sitting to relieve pressure on sacrum.  Pt left in room in w/c in semi tilted position with pt starting timer for pressure relief.    PM session:  Pt received in tilt in space after OT session. Pt declined vestibular assessment this pm secondary to not having any symptoms this morning with rolling.  Pt agreeable attempting w/c propulsion in regular manual w/c.  Transferred w/c <> w/c with slideboard as below.  Performed w/c mobility in manual w/c as below with therapist providing verbal and visual cues for propulsion sequence for energy conservation and joint conservation; in order to continue to use current manual w/c will need to lower axle to raise seat height and install a new back for positioning.  Transferred to bed uphill with slideboard and +2 A.  Pt left in bed with BP management garments doffed with all items within reach.  No c/o LUE pain during w/c propulsion.  Therapy Documentation Precautions:  Precautions Precautions: Cervical, Fall Precaution Comments: Using BLE ACE wraps for BP control at this time. Monitor this Required Braces or Orthoses: Cervical Brace Cervical Brace: Hard collar, At all times Restrictions Weight Bearing Restrictions: No Pain: Pain Assessment Pain Assessment: No/denies pain   Function:  Bed Mobility Roll left and right activity  Assist level: Max assist (Pt 25 - 49%) Roll left and right assistive device: Bedrails  Sit to lying activity        Lying to sitting activity   Assist level: 2 helpers Lying to sitting assistive device: Bedrails  Mobility details     Transfers    Chair/bed transfer   Chair/bed transfer method: Lateral scoot Chair/bed transfer assist level: Total assist (Pt  < 25%) Chair/bed transfer assistive device: Sliding board;Orthosis   Chair/bed transfer details: Tactile cues for initiation;Tactile cues for sequencing;Tactile cues for weight shifting;Tactile cues for posture;Verbal cues for sequencing;Verbal cues for technique;Verbal cues for precautions/safety;Manual facilitation for weight shifting;Manual facilitation for placement   Toilet transfer                Locomotion                               Wheelchair   Type: Manual Max wheelchair distance: 125 Assist Level: Touching or steadying assistance (Pt > 75%)  Wheel 50 feet with 2 turns activity   Assist Level: Touching or steadying assistance (Pt > 75%)         Cognition Comprehension Comprehension assist level: Follows complex conversation/direction with extra time/assistive device  Expression Expression assist level: Expresses complex ideas: With extra time/assistive device  Social Interaction Social Interaction assist level: Interacts appropriately with others with medication or extra time (anti-anxiety, antidepressant).  Problem Solving Problem solving assist level: Solves complex problems: With extra time  Memory Memory assist level: Recognizes or recalls 90% of the time/requires cueing < 10% of the time    Therapy/Group: Individual Therapy  Edman Circle [facility] 12/01/2014, 11:58 AM

## 2014-12-01 NOTE — Progress Notes (Signed)
Occupational Therapy Session Note  Patient Details  Name: Carl [name] MRN: [id number] Date of Birth: 06-07-1950  Today's Date: 12/01/2014 OT Individual Time: 1300-1400 OT Individual Time Calculation (min): 60 min    Short Term Goals: Week 2:  OT Short Term Goal 1 (Week 2): Pt will thread B UEs into shirt in order to increase independence with ADLs OT Short Term Goal 2 (Week 2): Pt will place toothpaste on toothbrush with set-up to open container during grooming task to increase independence with ADLs OT Short Term Goal 3 (Week 2): Pt will manipulate water cup and bring to mouth independently in order to increase independence with ADLs.   Skilled Therapeutic Interventions/Progress Updates:    1:1 focus on transfer training with Slide board tilt in space w/c<>mat. Pt with increased ability to initiate and maintain forward weight shift once board is place. Pt able to slide/ scoot along board with bilateral UE support with min A all 4 times!!!! Pt required min A to maintain the forward weight shift and maintain balance and extra time. Once sitting EOB focus on static sitting balance with and without UE support. Pt able to demonstrate this with supervision to min A. Focus on achieving hip translation from posterior to anterior pelvic tilt, elongation of trunk and trunk extension with tactile facilitation. Then able to transition to dynamic sitting balance with one UE support during functional reaching task with achieving anterior pelvic tilt and maintaining trunk elongation.   Therapy Documentation Precautions:  Precautions Precautions: Cervical, Fall Precaution Comments: Using BLE ACE wraps for BP control at this time. Monitor this Required Braces or Orthoses: Cervical Brace Cervical Brace: Hard collar, At all times Restrictions Weight Bearing Restrictions: No General:   Vital Signs:  Throughout session : 13:15 80/55      13:17  80/51     13:45 145/64      13:57 101/64 Pain: Pain  Assessment Pain Score: 0-No pain     Therapy/Group: Individual Therapy  Roney Mans [facility] 12/01/2014, 3:29 PM

## 2014-12-01 NOTE — Progress Notes (Signed)
Occupational Therapy Session Note  Patient Details  Name: Carl [name] MRN: [id number] Date of Birth: 05/21/1950  Today's Date: 12/01/2014 OT Individual Time: 0830-0930 OT Individual Time Calculation (min): 60 min    Short Term Goals: Week 2:  OT Short Term Goal 1 (Week 2): Pt will thread B UEs into shirt in order to increase independence with ADLs OT Short Term Goal 2 (Week 2): Pt will place toothpaste on toothbrush with set-up to open container during grooming task to increase independence with ADLs OT Short Term Goal 3 (Week 2): Pt will manipulate water cup and bring to mouth independently in order to increase independence with ADLs.   Skilled Therapeutic Interventions/Progress Updates:    Pt seen for OT therapy session focusing on ADL re-training. Pt in supine upon arrival, agreeable to tx session. Pt completed LB bathing from bed level. HOB elevated and pt assisted to obtain circle sit positioning. Pt required assist to manage straight sitting position in bed. Assist provided to hold LEs into position for pt to be able to reach to wash. Due to discomfort in UEs, pt required assist to wash B feet. Pt transferred to EOB with +2 assist. With TED hose and ACE wraps donned, pt's BP 71/39. Abdominal binder donned and pt transferred with +2 assist via sliding board into reclining w/[name] where he was reclined and BP 131/64. RN aware. Pt then completed grooming task from w/[name] level, able to manage containers, opening/ closing containers with increased time. Using sink ledge to assist, pt able to pull self up off back of w/[name] to access sink. Pt taken to therapy gym with handoff to PT.   Therapy Documentation Precautions:  Precautions Precautions: Cervical, Fall Precaution Comments: Using BLE ACE wraps for BP control at this time. Monitor this Required Braces or Orthoses: Cervical Brace Cervical Brace: Hard collar, At all times Restrictions Weight Bearing Restrictions: No Pain: Pain Assessment Pain  Assessment: No/denies pain  Function:   Grooming Oral Care,Brush Teeth, Clean Dentures Activity:      Assist Level: Set up   Set up : To apply toothpaste  Wash, Rinse, Dry Face Activity   Assist Level: Set up   Set up : To obtain items  Wash, Rinse, Dry Hands Activity          Brush, Comb Hair Activity        Shave Activity          Apply Makeup Activity                                                             Bathing Bathing position      Bathing parts Body parts bathed by patient: Right upper leg;Left upper leg Body parts bathed by helper: Right lower leg;Left lower leg  Bathing assist Assist Level: Assistive device       Upper Body Dressing/Undressing Upper body dressing           Pull over shirt/dress - Perfomed by helper: Put head through opening;Pull shirt over trunk;Thread/unthread left sleeve;Thread/unthread right sleeve        Upper body assist         Lower Body Dressing/Undressing Lower body dressing   What is the patient wearing?: Ted Hose;Non-skid slipper socks;Pants       Pants- Performed by  helper: Thread/unthread right pants leg;Thread/unthread left pants leg;Pull pants up/down   Non-skid slipper socks- Performed by helper: Don/doff right sock;Don/doff left sock               TED Hose - Performed by helper: Don/doff right TED hose;Don/doff left TED hose  Lower body assist Assist Level: 2 Helpers       Bed Mobility Roll left and right activity   Assist level: Max assist (Pt 25 - 49%)  Sit to lying activity      Lying to sitting activity   Assist level: 2 helpers  Mobility details     Transfers Sit to stand transfer        Chair/bed transfer   Chair/bed transfer method: Lateral scoot Chair/bed transfer assist level: 2 helpers Chair/bed transfer assistive device: Sliding board;Orthosis      Toilet transfer                Tub/shower transfer               Therapy/Group: Individual  Therapy  Carl [name] 12/01/2014, 10:37 AM

## 2014-12-02 ENCOUNTER — Inpatient Hospital Stay (HOSPITAL_COMMUNITY): Payer: Federal, State, Local not specified - PPO

## 2014-12-02 ENCOUNTER — Inpatient Hospital Stay (HOSPITAL_COMMUNITY): Payer: Federal, State, Local not specified - PPO | Admitting: Physical Therapy

## 2014-12-02 NOTE — Progress Notes (Signed)
Physical Therapy Session Note  Patient Details  Name: Carl [name] MRN: [id number] Date of Birth: 1950-11-23  Today's Date: 12/02/2014 PT Individual Time: 1302-1410 PT Individual Time Calculation (min): 68 min   Short Term Goals: Week 2:  PT Short Term Goal 1 (Week 2): Pt will perform bed mobility rolling and supine <> sit with use of leg loops and max A of one person PT Short Term Goal 2 (Week 2): Pt will perform slideboard transfer bed <> chair on level surface with max A of one person and use of leg loops PT Short Term Goal 3 (Week 2): Pt will trial and perform w/c mobility in manual w/c x 50' with mod A  PT Short Term Goal 4 (Week 2): Pt will tolerate sitting up OOB x 4-6 hours and be independent with requesting pressure relief every 30 minutes PT Short Term Goal 5 (Week 2): Pt will tolerate standing in frame x 5 minutes for WB and core strengthening during dynamic UE activity    Skilled Therapeutic Interventions/Progress Updates:   PT donned bil LE ACE wraps over TEDS and abdominal binder for hypotension protection iwith pt in supine in bed.   Donned pants in supine via rolling multiple times L<>R.   supine> sit with head of bed raised, mod assist. Pt stated he did not have leg loops in room.  BP sitting EOB x 5 minutes 100/55, HR 43.SBT +2 transfer , pt directing transfer with min cues.  He needed min cues to initiate head/hips relationship, functionally.  W/c prop in regular manual w/c x 156' wth close S, VCs for steering, min assist for turns.  PT instructed pt in best techniques for efficitent turns. BP 126/74, HR 40.   Standing frame x 45 sec, x 30 sec, x 15 sec.  neuromuscular re-education via visual feedback, VCs for R lateral wt shifting to bring hips over feet, to achieve midline orientation.  Standing duration limited by pt discomfort, hypotension.  Sitting rest breaks x 2-3 minutes between each trial of standing, BPs 104/59 first stand; 85/58 by 3rd stand.   From standing  frame, pt transferred into tilt in space w/c, set at 25 degrees tilt.  Quick release belt applied; pt returned to room and all needs left within reach.      Therapy Documentation Precautions:  Precautions Precautions: Cervical, Fall Precaution Comments: Using BLE ACE wraps for BP control at this time. Monitor this Required Braces or Orthoses: Cervical Brace Cervical Brace: Hard collar, At all times Restrictions Weight Bearing Restrictions: No  Pain:none voiced       Function:  Toileting Toileting Toileting activity did not occur: No continent bowel/bladder event           Bed Mobility Roll left and right activity   Assist level: Mod assist (Pt 50 - 74%, lift 2 legs) Roll left and right assistive device: Bedrails  Sit to lying activity        Lying to sitting activity   Assist level: 2 helpers Lying to sitting assistive device: HOB elevated  Mobility details Bed mobility details: Manual facilitation for placement;Verbal cues for techniques;Manual facilitation for weight shifting   Transfers Sit to stand transfer        Chair/bed transfer   Chair/bed transfer method: Lateral scoot Chair/bed transfer assist level: 2 helpers Chair/bed transfer assistive device: Sliding board;Orthosis   Chair/bed transfer details: Tactile cues for initiation;Tactile cues for sequencing;Tactile cues for weight shifting;Tactile cues for posture;Verbal cues for sequencing;Verbal cues for technique;Verbal cues  for precautions/safety;Manual facilitation for weight shifting;Manual facilitation for placement   Toilet transfer Toilet transfer activity did not occur: Safety/medical concerns              Car transfer          Locomotion Ambulation          Walk 10 feet activity      Walk 50 feet with 2 turns activity      Walk 150 feet activity      Walk 10 feet on uneven surfaces activity      Stairs          Walk up/down 1 step activity        Walk up/down 4 steps  activity      Walk up/down 12 steps activity      Pick up small objects from floor      Wheelchair   Type: Manual (standard, not tilt in space) Max wheelchair distance: 156 Assist Level: Supervision or verbal cues;Touching or steadying assistance (Pt > 75%)  Wheel 50 feet with 2 turns activity   Assist Level: Touching or steadying assistance (Pt > 75%);Supervision or verbal cues  Wheel 150 feet activity   Assist Level: Supervision or verbal cues   Cognition Comprehension Comprehension assist level: Follows complex conversation/direction with no assist  Expression Expression assist level: Expresses complex ideas: With no assist  Social Interaction Social Interaction assist level: Interacts appropriately with others - No medications needed.  Problem Solving Problem solving assist level: Solves complex problems: Recognizes & self-corrects  Memory Memory assist level: Recognizes or recalls 90% of the time/requires cueing < 10% of the time    Therapy/Group: Individual Therapy  Ninnie Fein 12/02/2014, 5:02 PM

## 2014-12-02 NOTE — Progress Notes (Signed)
Physical Therapy Session Note  Patient Details  Name: Carl [name] MRN: [id number] Date of Birth: 17-Jul-1950  Today's Date: 12/02/2014 PT Individual Time: 1000-1030 PT Individual Time Calculation (min): 30 min   Short Term Goals: Week 2:  PT Short Term Goal 1 (Week 2): Pt will perform bed mobility rolling and supine <> sit with use of leg loops and max A of one person PT Short Term Goal 2 (Week 2): Pt will perform slideboard transfer bed <> chair on level surface with max A of one person and use of leg loops PT Short Term Goal 3 (Week 2): Pt will trial and perform w/c mobility in manual w/c x 50' with mod A  PT Short Term Goal 4 (Week 2): Pt will tolerate sitting up OOB x 4-6 hours and be independent with requesting pressure relief every 30 minutes PT Short Term Goal 5 (Week 2): Pt will tolerate standing in frame x 5 minutes for WB and core strengthening during dynamic UE activity  Skilled Therapeutic Interventions/Progress Updates:    Pt received in bed with hard c-collar in place, agreeable to PT, choosing to do leg exercises in bed due to short (30 min) PT session. Therapeutic Activity - PT places thigh high TEDs on pt and provides max A with doffing sweaty shirt and donning clean t-shirt in bed (long sit) - pt able to get head in/out of shirt hole, but req PT assist at trunk for balance and PT assist for arms through arm holes. After NMR activity, pt reports actively having a BM - see below for toileting details with nurse tech in cleaning pt up. Pt ended in R side lie with pillows assisting in positioning and reducing pressure points; soft call bell in reach. Neuromuscular Reeducation - PT instructs pt in B LE motor recover activities: AAROM-min/mod resistance as pt tolerates: ankle PF/DF, knee to chest/pushing leg towards extension: x 10 reps each side. Continue per PT POC.   Therapy Documentation Precautions:  Precautions Precautions: Cervical, Fall Precaution Comments: Using BLE ACE  wraps for BP control at this time. Monitor this Required Braces or Orthoses: Cervical Brace Cervical Brace: Hard collar, At all times Restrictions Weight Bearing Restrictions: No Pain: Pain Assessment Pain Assessment: 0-10 Pain Score: 1  Pain Type: Acute pain Pain Location: Arm Pain Orientation: Left Pain Descriptors / Indicators: Aching;Sore Pain Onset: On-going Pain Intervention(s): Rest;Repositioned Multiple Pain Sites: No   Function:  Financial trader activity did not occur: No continent bowel/bladder event   Toileting steps completed by helper: Adjust clothing prior to toileting;Performs perineal hygiene;Adjust clothing after toileting   Assist level: Two helpers   Bed Mobility Roll left and right activity   Assist level: Mod assist (Pt 50 - 74%, lift 2 legs) Roll left and right assistive device: Bedrails  Sit to lying activity        Lying to sitting activity        Mobility details Bed mobility details: Manual facilitation for placement;Verbal cues for techniques;Manual facilitation for weight shifting   Transfers Sit to stand transfer        Chair/bed transfer               Toilet transfer                Car transfer          Locomotion Ambulation          Walk 10 feet activity      Walk 50 feet with 2  turns activity      Walk 150 feet activity      Walk 10 feet on uneven surfaces activity      Stairs          Walk up/down 1 step activity        Walk up/down 4 steps activity      Walk up/down 12 steps activity      Pick up small objects from floor      Wheelchair          Wheel 50 feet with 2 turns activity      Wheel 150 feet activity       Cognition Comprehension Comprehension assist level: Follows complex conversation/direction with extra time/assistive device  Expression Expression assist level: Expresses complex ideas: With extra time/assistive device  Social Interaction Social Interaction  assist level: Interacts appropriately with others with medication or extra time (anti-anxiety, antidepressant).  Problem Solving Problem solving assist level: Solves complex problems: With extra time  Memory Memory assist level: Recognizes or recalls 90% of the time/requires cueing < 10% of the time    Therapy/Group: Individual Therapy  Arushi Partridge M 12/02/2014, 12:34 PM

## 2014-12-02 NOTE — Progress Notes (Signed)
[place] PHYSICAL MEDICINE & REHABILITATION     PROGRESS NOTE    Subjective/Complaints: Had loose/soft stool at 0240 this morning. Still struggling with bowel program.  ROS: Pt denies  , rash/itching, headache, blurred or double vision, nausea, vomiting, abdominal pain, diarrhea, chest pain, shortness of breath, palpitations, dysuria, dizziness,  back pain, bleeding, anxiety, or depression    Objective: Vital Signs: Blood pressure 90/58, pulse 37, temperature 98.5 F (36.9 C), temperature source Oral, resp. rate 18, height 5\' 8"  (1.727 m), weight 77.61 kg (171 lb 1.6 oz), SpO2 96 %. No results found. No results for input(s): WBC, HGB, HCT, PLT in the last 72 hours. No results for input(s): NA, K, CL, GLUCOSE, BUN, CREATININE, CALCIUM in the last 72 hours.  Invalid input(s): CO CBG (last 3)  No results for input(s): GLUCAP in the last 72 hours.  Wt Readings from Last 3 Encounters:  11/29/14 77.61 kg (171 lb 1.6 oz)  11/16/14 90.719 kg (200 lb)    Physical Exam:  Constitutional: He is oriented to person, place, and time. He appears well-developed and well-nourished.  HENT:  Head: Normocephalic and atraumatic.    Eyes: Conjunctivae are normal. Pupils are equal, round, and reactive to light.  Neck:  C Collar.  Cardiovascular: Normal rate and regular rhythm.  Respiratory: Effort normal. No respiratory distress. He has no wheezes.  GI: Soft. Bowel sounds +.   Genitourinary: foley out Musculoskeletal: He exhibits tenderness. He exhibits no edema.  Neurological: He is alert and oriented to person, place, and time. No cranial nerve deficit. Coordination normal.  Patient has normal sensation proximal to C6 dermatome. Patient has reduced sensation to pinprick but is able to identify the area touched at bilateral C6 through sacral levels Patient has decreased tone in bilateral Lower extremities 4/5 bilateral deltoids, biceps, 4- triceps, 4 wrist extensors, trace right HI, 1+ to  2- left HI, right HF, KE and ADF/APF are 1 to 2-/5. Tr 1/5 left hip flexors and knee extensors ankle dorsiflexors and plantar flexors  Skin: Skin is warm and dry.  Cervical incision intact   Assessment/Plan: 1. 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 Physiatrist is providing close team supervision and 24 hour management of active medical problems listed below. Physiatrist and rehab team continue to assess barriers to discharge/monitor patient progress toward functional and medical goals.  FIM: Function - Bathing Position: Bed Body parts bathed by patient: Right upper leg, Left upper leg Body parts bathed by helper: Right lower leg, Left lower leg Assist Level: Assistive device Assistive Device Comment: Hospital bed functions  Function- Upper Body Dressing/Undressing What is the patient wearing?: Pull over shirt/dress Pull over shirt/dress - Perfomed by patient: Thread/unthread right sleeve, Thread/unthread left sleeve Pull over shirt/dress - Perfomed by helper: Put head through opening, Pull shirt over trunk, Thread/unthread left sleeve, Thread/unthread right sleeve Assist Level: 2 helpers Set up : To obtain clothing/put away Function - Lower Body Dressing/Undressing What is the patient wearing?: Ted Hose, Non-skid slipper socks, Pants Position: Bed Pants- Performed by helper: Thread/unthread right pants leg, Thread/unthread left pants leg, Pull pants up/down Non-skid slipper socks- Performed by helper: Don/doff right sock, Don/doff left sock Socks - Performed by helper: Don/doff right sock, Don/doff left sock Shoes - Performed by helper: Don/doff right shoe, Don/doff left shoe, Fasten right, Fasten left TED Hose - Performed by helper: Don/doff right TED hose, Don/doff left TED hose  Assist Level: 2 Helpers  Function - Toileting Toileting activity did not occur: No continent  bowel/bladder event Toileting steps completed by helper: Adjust clothing prior to toileting, Performs perineal hygiene, Adjust clothing after toileting Assist level: Two helpers  Function - Archivist transfer activity did not occur: Safety/medical concerns  Function - Chair/bed transfer Chair/bed transfer method: Lateral scoot Chair/bed transfer assist level: Total assist (Pt < 25%) Chair/bed transfer assistive device: Sliding board, Orthosis Chair/bed transfer details: Tactile cues for initiation, Tactile cues for sequencing, Tactile cues for weight shifting, Tactile cues for posture, Verbal cues for sequencing, Verbal cues for technique, Verbal cues for precautions/safety, Manual facilitation for weight shifting, Manual facilitation for placement  Function - Locomotion: Wheelchair Will patient use wheelchair at discharge?: Yes Type: Manual Max wheelchair distance: 125 Assist Level: Touching or steadying assistance (Pt > 75%) Assist Level: Touching or steadying assistance (Pt > 75%) Assist Level: Dependent (Pt equals 0%) Function - Locomotion: Ambulation Ambulation activity did not occur: Safety/medical concerns Walk 10 feet activity did not occur: Safety/medical concerns Walk 50 feet with 2 turns activity did not occur: Safety/medical concerns Walk 150 feet activity did not occur: Safety/medical concerns Walk 10 feet on uneven surfaces activity did not occur: Safety/medical concerns  Function - Comprehension Comprehension: Auditory Comprehension assist level: Follows complex conversation/direction with extra time/assistive device  Function - Expression Expression: Verbal Expression assist level: Expresses complex ideas: With extra time/assistive device  Function - Social Interaction Social Interaction assist level: Interacts appropriately with others with medication or extra time (anti-anxiety, antidepressant).  Function - Problem Solving Problem solving assist  level: Solves complex problems: With extra time  Function - Memory Memory assist level: Recognizes or recalls 90% of the time/requires cueing < 10% of the time Patient normally able to recall (first 3 days only): Location of own room, Staff names and faces, That he or she is in a hospital, Current season Medical Problem List and Plan: 1. 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 DVT Prophylaxis/Anticoagulation: Pharmaceutical: Lovenox to continue  -dopplers negative 3. Pain Management: Continue Lyrica BID for neuropathic pain.tramdol for mod and oxy for severe pain 4. Mood: Team to provide ego support.  .    -continue to monitor clinically. neuropsych consult appreciated 5. Neuropsych: This patient is capable of making decisions on his own behalf. 6. Skin/Wound Care: Routine pressure relief measures.  7. Fluids/Electrolytes/Nutrition: 100% meals. Pushing fluids 8. WUJ:[id number] negatie. afebrile  -ucx + for klebsiella oxytoca--continue septra completed   9. CV: abd binder/compression stockings to maintain sitting/upright bp, check orthostatic vitals  -does not require pharmaceutical rx at present  -encourage fluids  -bp's better on midodrine  0.5mg --watch for elevated sbp  -limit pain meds (as possible)  -some of dizziness may be vestibular---added antivert, treatment limited by collar 10. Acutre renal failure---volume depletion--resolved   - push po  - Bun/cr normal last week---follow up monday 11. Neurogenic bladder---I/O caths---no urge or sense of bladder fullness yet  -continue to keep volumes 300-500cc 12. Neurogenic bowel: working on qam bowel program  LOS (Days) 12 A FACE TO FACE EVALUATION WAS PERFORMED  Carl [name] T 12/02/2014 8:22 AM

## 2014-12-03 ENCOUNTER — Inpatient Hospital Stay (HOSPITAL_COMMUNITY): Payer: Federal, State, Local not specified - PPO | Admitting: Occupational Therapy

## 2014-12-03 DIAGNOSIS — R001 Bradycardia, unspecified: Secondary | ICD-10-CM

## 2014-12-03 NOTE — Progress Notes (Signed)
PHYSICAL MEDICINE & REHABILITATION     PROGRESS NOTE    Subjective/Complaints: Had loose/soft stool at 0240 this morning. Still struggling with bowel program.  ROS: Pt denies  , rash/itching, headache, blurred or double vision, nausea, vomiting, abdominal pain, diarrhea, chest pain, shortness of breath, palpitations, dysuria, dizziness,  back pain, bleeding, anxiety, or depression    Objective: Vital Signs: Blood pressure 82/51, pulse 40, temperature 98.3 F (36.8 C), temperature source Oral, resp. rate 18, height 5\' 8"  (1.727 m), weight 77.61 kg (171 lb 1.6 oz), SpO2 99 %. No results found. No results for input(s): WBC, HGB, HCT, PLT in the last 72 hours. No results for input(s): NA, K, CL, GLUCOSE, BUN, CREATININE, CALCIUM in the last 72 hours.  Invalid input(s): CO CBG (last 3)  No results for input(s): GLUCAP in the last 72 hours.  Wt Readings from Last 3 Encounters:  11/29/14 77.61 kg (171 lb 1.6 oz)  11/16/14 90.719 kg (200 lb)    Physical Exam:  Constitutional: He is oriented to person, place, and time. He appears well-developed and well-nourished.  HENT:  Head: Normocephalic and atraumatic.    Eyes: Conjunctivae are normal. Pupils are equal, round, and reactive to light.  Neck:  C Collar.  Cardiovascular: Normal rate and regular rhythm.  Respiratory: Effort normal. No respiratory distress. He has no wheezes.  GI: Soft. Bowel sounds +.   Genitourinary: foley out Musculoskeletal: He exhibits tenderness. He exhibits no edema.  Neurological: He is alert and oriented to person, place, and time. No cranial nerve deficit. Coordination normal.  Patient has normal sensation proximal to C6 dermatome. Patient has reduced sensation to pinprick but is able to identify the area touched at bilateral C6 through sacral levels Patient has decreased tone in bilateral Lower extremities 4/5 bilateral deltoids, biceps, 4- triceps, 4 wrist extensors, trace right HI, 1+ to  2- left HI, right HF, KE and ADF/APF are 1 to 2-/5. Tr 1/5 left hip flexors and knee extensors ankle dorsiflexors and plantar flexors  Skin: Skin is warm and dry.  Cervical incision intact   Assessment/Plan: 1. 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 Physiatrist is providing close team supervision and 24 hour management of active medical problems listed below. Physiatrist and rehab team continue to assess barriers to discharge/monitor patient progress toward functional and medical goals.  FIM: Function - Bathing Position: Bed Body parts bathed by patient: Right upper leg, Left upper leg Body parts bathed by helper: Right lower leg, Left lower leg Assist Level: Assistive device Assistive Device Comment: Hospital bed functions  Function- Upper Body Dressing/Undressing What is the patient wearing?: Pull over shirt/dress Pull over shirt/dress - Perfomed by patient: Thread/unthread right sleeve, Thread/unthread left sleeve Pull over shirt/dress - Perfomed by helper: Put head through opening, Pull shirt over trunk, Thread/unthread left sleeve, Thread/unthread right sleeve Assist Level: 2 helpers Set up : To obtain clothing/put away Function - Lower Body Dressing/Undressing What is the patient wearing?: Ted Hose, Non-skid slipper socks, Pants Position: Bed Pants- Performed by helper: Thread/unthread right pants leg, Thread/unthread left pants leg, Pull pants up/down Non-skid slipper socks- Performed by helper: Don/doff right sock, Don/doff left sock Socks - Performed by helper: Don/doff right sock, Don/doff left sock Shoes - Performed by helper: Don/doff right shoe, Don/doff left shoe, Fasten right, Fasten left TED Hose - Performed by helper: Don/doff right TED hose, Don/doff left TED hose  Assist Level: 2 Helpers  Function - Toileting Toileting activity did not occur: No continent  bowel/bladder event Toileting steps completed by helper: Adjust clothing prior to toileting, Performs perineal hygiene, Adjust clothing after toileting Assist level: Two helpers  Function - Archivist transfer activity did not occur: Safety/medical concerns  Function - Chair/bed transfer Chair/bed transfer method: Lateral scoot Chair/bed transfer assist level: 2 helpers Chair/bed transfer assistive device: Sliding board, Orthosis Chair/bed transfer details: Tactile cues for initiation, Tactile cues for sequencing, Tactile cues for weight shifting, Tactile cues for posture, Verbal cues for sequencing, Verbal cues for technique, Verbal cues for precautions/safety, Manual facilitation for weight shifting, Manual facilitation for placement  Function - Locomotion: Wheelchair Will patient use wheelchair at discharge?: Yes Type: Manual (standard, not tilt in space) Max wheelchair distance: 156 Assist Level: Supervision or verbal cues, Touching or steadying assistance (Pt > 75%) Assist Level: Touching or steadying assistance (Pt > 75%), Supervision or verbal cues Assist Level: Supervision or verbal cues Function - Locomotion: Ambulation Ambulation activity did not occur: Safety/medical concerns Walk 10 feet activity did not occur: Safety/medical concerns Walk 50 feet with 2 turns activity did not occur: Safety/medical concerns Walk 150 feet activity did not occur: Safety/medical concerns Walk 10 feet on uneven surfaces activity did not occur: Safety/medical concerns  Function - Comprehension Comprehension: Auditory Comprehension assist level: Follows complex conversation/direction with no assist  Function - Expression Expression: Verbal Expression assist level: Expresses complex ideas: With no assist  Function - Social Interaction Social Interaction assist level: Interacts appropriately with others - No medications needed.  Function - Problem Solving Problem solving assist  level: Solves complex problems: Recognizes & self-corrects  Function - Memory Memory assist level: Recognizes or recalls 90% of the time/requires cueing < 10% of the time Patient normally able to recall (first 3 days only): Location of own room, Staff names and faces, That he or she is in a hospital, Current season Medical Problem List and Plan: 1. 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 DVT Prophylaxis/Anticoagulation: Pharmaceutical: Lovenox to continue  -dopplers negative 3. Pain Management: Continue Lyrica BID for neuropathic pain.tramdol for mod and oxy for severe pain 4. Mood: Team to provide ego support.  .    -continue to monitor clinically. neuropsych consult appreciated 5. Neuropsych: This patient is capable of making decisions on his own behalf. 6. Skin/Wound Care: Routine pressure relief measures.  7. Fluids/Electrolytes/Nutrition: 100% meals. Pushing fluids 8. RUE:[id number] negatie. afebrile  -ucx + for klebsiella oxytoca--continue septra completed   9. CV: abd binder/compression stockings to maintain sitting/upright bp, check orthostatic vitals  -continued bradycardia--asymptomatic. Check ekg today to rule out heart block  -encourage fluids  -bp's better on midodrine  0.5mg --watch for elevated sbp  -limit pain meds (as possible)  -some of dizziness may be vestibular---added antivert, treatment limited by collar 10. Acutre renal failure---volume depletion--resolved   - push po  - Bun/cr normal last week---follow up monday 11. Neurogenic bladder---I/O caths---no urge or sense of bladder fullness yet  -continue to keep volumes 300-500cc 12. Neurogenic bowel: working on qam bowel program  LOS (Days) 13 A FACE TO FACE EVALUATION WAS PERFORMED  Dj Senteno T 12/03/2014 9:08 AM

## 2014-12-03 NOTE — Plan of Care (Signed)
Problem: SCI BOWEL ELIMINATION Goal: RH STG MANAGE BOWEL WITH ASSISTANCE STG Manage Bowel with total Assistance.  Outcome: Not Progressing Pt is in bowel program Goal: RH STG MANAGE BOWEL W/EQUIPMENT W/ASSISTANCE STG Manage Bowel With Equipment With total Assistance  Outcome: Not Progressing Patient has bowel program in place  Problem: SCI BLADDER ELIMINATION Goal: RH STG MANAGE BLADDER WITH ASSISTANCE STG Manage Bladder With Max Assistance  Outcome: Not Progressing I/o cath q6h

## 2014-12-03 NOTE — Progress Notes (Signed)
Nursing Note: Pt in and out cathed for 400 cc of clear yellow urine. Pt cathed by Crystal,NT and pt tolerated well.wbb

## 2014-12-03 NOTE — Progress Notes (Signed)
12/03/14 1037 NURSING- EKG done- results are in patients chart.

## 2014-12-03 NOTE — Progress Notes (Signed)
Nursing Note: Pt in and out cathed for 275 cc of clear yellow urine.Pt catrhed by Crystal,NT.Pt tolerated well.wbb

## 2014-12-03 NOTE — Progress Notes (Signed)
Nursing Note: pt in and out cathed per policy for 200 cc of clear yellow urine..Pt tolerated well.Pt cathed by Donna Bernard

## 2014-12-03 NOTE — Progress Notes (Signed)
Occupational Therapy Session Note  Patient Details  Name: Clinton Dragone MRN: [id number] Date of Birth: [birth year]  Today's Date: 12/03/2014 OT Individual Time: 1100-1210 OT Individual Time Calculation (min): 70 min    Short Term Goals: Week 1:  OT Short Term Goal 1 (Week 1): Pt will bathe in supine with mod A OT Short Term Goal 1 - Progress (Week 1): Not met OT Short Term Goal 2 (Week 1): Pt will direct care for bed mobility with min questioning cues OT Short Term Goal 2 - Progress (Week 1): Met OT Short Term Goal 3 (Week 1): Pt will self feed 50% of meal with min A OT Short Term Goal 3 - Progress (Week 1): Met Week 2:  OT Short Term Goal 1 (Week 2): Pt will thread B UEs into shirt in order to increase independence with ADLs OT Short Term Goal 2 (Week 2): Pt will place toothpaste on toothbrush with set-up to open container during grooming task to increase independence with ADLs OT Short Term Goal 3 (Week 2): Pt will manipulate water cup and bring to mouth independently in order to increase independence with ADLs.  Week 3:     Skilled Therapeutic Interventions/Progress Updates:  Pt. Lying in bed.  Addressed dressing, transfers.  Monitored BP throughout session.  Applied THT, ace, binder.  See below for BP readings and functions.  Performed BUE therapeutic exericises in supine.,  Dressing:  Pt able to thread BUE into shirt.  Supine to EOB with total +2.  Sat EOB with total assist +1.  Educated pt on engaging trunk and using UE as supports.  Used TB to go to tilt in space with +2.   Pt required min A to maintain the forward weight shift and maintain balance and extra time. Positioned in wc with all needs in reach.  AE set up for lunch  Therapy Documentation Precautions:  Precautions Precautions: Cervical, Fall Precaution Comments: Using BLE ACE wraps for BP control at this time. Monitor this Required Braces or Orthoses: Cervical Brace Cervical Brace: Hard collar, At all  times Restrictions Weight Bearing Restrictions: No    Vital Signs: Therapy Vitals Temp: 98.3 F (36.8 C) Pullse Rate: (!) 40 Resp: 18 BP: 105/62, HRo= 38;  Sitting Edge of Bed: 81/62 Pulse: 49 (!)110/66 after dressing in supine and applying binder and THT and ACE Wraps; mmHg  Sitting Edge of Bed: 81/62;  Transferred to wc 85/61; HR= 46;   Patient Position (if appropriate): Lying and sitting Oxygen Therapy SpO2: 99 % O2 Device: Not Delivered Pain: Pain Assessment Pain Score: 3       Function:   Eating Eating               Grooming Oral Care,Brush Teeth, Clean Dentures Activity:             Wash, Rinse, Dry Face Activity          Wash, Rinse, Dry Hands Activity          Brush, Comb Hair Activity        Shave Activity          Apply Makeup Activity                                                             Bathing  Bathing position      Bathing parts      Bathing assist         Upper Body Dressing/Undressing Upper body dressing   What is the patient wearing?: Pull over shirt/dress       Pull over shirt/dress - Perfomed by helper: Put head through opening;Pull shirt over trunk;Thread/unthread left sleeve;Thread/unthread right sleeve        Upper body assist Assist Level: 2 helpers   Set up : To obtain clothing/put away   Lower Body Dressing/Undressing Lower body dressing   What is the patient wearing?: Ted Hose;Non-skid slipper socks;Pants       Pants- Performed by helper: Thread/unthread right pants leg;Thread/unthread left pants leg;Pull pants up/down   Non-skid slipper socks- Performed by helper: Don/doff right sock;Don/doff left sock   Socks - Performed by helper: Don/doff right sock;Don/doff left sock   Shoes - Performed by helper: Don/doff right shoe;Don/doff left shoe;Fasten right;Fasten left       TED Hose - Performed by helper: Don/doff right TED hose;Don/doff left TED hose  Lower body assist Assist Level: 2  Helpers       Toileting Toileting          Toileting assist      Bed Mobility Roll left and right activity   Assist level: Max assist (Pt 25 - 49%)  Sit to lying activity Sit to lying acitivty did not occur: Safety/medical/concerns Assist level: Max assist (Pt 25 - 49%)  Lying to sitting activity Lying to sitting acitivty did not occur: Safety/medical/concerns Assist level: 2 helpers  Mobility details Bed mobility details: Manual facilitation for placement;Verbal cues for techniques;Manual facilitation for weight shifting   Transfers Sit to stand transfer        Chair/bed transfer   Chair/bed transfer method: Lateral scoot Chair/bed transfer assist level: 2 helpers Chair/bed transfer assistive device: Sliding board;Orthosis   Chair/bed transfer details: Manual facilitation for weight shifting;Manual facilitation for placement  Toilet transfer                Tub/shower transfer             Cognition Comprehension    Expression    Social Interaction    Problem Solving    Memory      Therapy/Group: Individual Therapy  Lisa Roca 12/03/2014, 12:27 PM

## 2014-12-04 ENCOUNTER — Inpatient Hospital Stay (HOSPITAL_COMMUNITY): Payer: Federal, State, Local not specified - PPO | Admitting: Physical Therapy

## 2014-12-04 ENCOUNTER — Inpatient Hospital Stay (HOSPITAL_COMMUNITY): Payer: Federal, State, Local not specified - PPO | Admitting: Occupational Therapy

## 2014-12-04 ENCOUNTER — Inpatient Hospital Stay (HOSPITAL_COMMUNITY): Payer: Federal, State, Local not specified - PPO

## 2014-12-04 DIAGNOSIS — R55 Syncope and collapse: Secondary | ICD-10-CM

## 2014-12-04 LAB — BASIC METABOLIC PANEL
ANION GAP: 8 (ref 5–15)
BUN: 22 mg/dL — ABNORMAL HIGH (ref 6–20)
CALCIUM: 9 mg/dL (ref 8.9–10.3)
CO2: 27 mmol/L (ref 22–32)
Chloride: 97 mmol/L — ABNORMAL LOW (ref 101–111)
Creatinine, Ser: 0.97 mg/dL (ref 0.61–1.24)
Glucose, Bld: 106 mg/dL — ABNORMAL HIGH (ref 65–99)
Potassium: 4.3 mmol/L (ref 3.5–5.1)
Sodium: 132 mmol/L — ABNORMAL LOW (ref 135–145)

## 2014-12-04 LAB — CBC
HCT: 37.1 % — ABNORMAL LOW (ref 39.0–52.0)
HEMOGLOBIN: 12.2 g/dL — AB (ref 13.0–17.0)
MCH: 29.5 pg (ref 26.0–34.0)
MCHC: 32.9 g/dL (ref 30.0–36.0)
MCV: 89.6 fL (ref 78.0–100.0)
Platelets: 300 10*3/uL (ref 150–400)
RBC: 4.14 MIL/uL — AB (ref 4.22–5.81)
RDW: 13.1 % (ref 11.5–15.5)
WBC: 7 10*3/uL (ref 4.0–10.5)

## 2014-12-04 MED ORDER — FLUDROCORTISONE ACETATE 0.1 MG PO TABS
0.1000 mg | ORAL_TABLET | Freq: Every day | ORAL | Status: DC
  Administered 2014-12-04 – 2014-12-14 (×11): 0.1 mg via ORAL
  Filled 2014-12-04 (×15): qty 1

## 2014-12-04 NOTE — Progress Notes (Signed)
Nursing Note: Pt in and out cathed by Marissa, NT for 350 cc of clear yellow urine  per hospital policy. Pt tolerated well.wbb

## 2014-12-04 NOTE — Progress Notes (Signed)
Recreational Therapy Session Note  Patient Details  Name: Carl [name] MRN: [id number] Date of Birth: 11-08-1950 Today's Date: 12/04/2014  Pt participated in animal assisted activity/therapy bed level with supervision/set up assist.  [name] 12/04/2014, 3:24 PM

## 2014-12-04 NOTE — Progress Notes (Signed)
Occupational Therapy Session Note  Patient Details  Name: Carl [name] MRN: [id number] Date of Birth: [birth year]  Today's Date: 12/04/2014 OT Individual Time: 0830-0930 and 1100-1200 and 1300-1400 OT Individual Time Calculation (min): 60 min and 60 min and 60 min   Short Term Goals: Week 2:  OT Short Term Goal 1 (Week 2): Pt will thread B UEs into shirt in order to increase independence with ADLs OT Short Term Goal 2 (Week 2): Pt will place toothpaste on toothbrush with set-up to open container during grooming task to increase independence with ADLs OT Short Term Goal 3 (Week 2): Pt will manipulate water cup and bring to mouth independently in order to increase independence with ADLs.   Skilled Therapeutic Interventions/Progress Updates:    Session One Pt seen for OT ADL bathing and dressing session. Pt in supine upon arrival, agreeable to tx session. Pt completed LB bathing task sitting upright in bed with HOB fully upright. Assist provided to position pt in circle sit position. Pt with increased motor movement/ activation in R LE> L LE to assist in flexing knee and positioning in circle sit position. Pt able to extend both LEs out to neutral position with assist of UEs. Pants applied in supine, rolling in bed with +2 assist to pull pants up. Pt transferred to EOB with +2 assist. He donned shirt sitting EOB, able to maintain balance with close supervision while completing functional task. Sitting EOB with TED hose and ACE wraps applied to B LEs, BP 70/43. Abdominal binder donned.  During sliding board transfer to w/c, pt voiced having felt as if he had BM. Transferred back to supine and NT made aware. Pt left in supine with nursing present to assist with care.   Session Two: Pt seen for OT therapy session focusing on fine motor coordination, static/ dynamic sitting balance, and functional activity tolerance. Pt sitting on EOM upon arrival with hand off from PT. Sitting EOM, pt completed lateral  leans to R side to reach for cone in prep for LB dressing and functional sitting tasks. Min- mod steadying assist required to come back into upright sitting as pt with posterior lean and decreased endurance. Attempted lateral leans to R, however, due to R UE pain, pt unable to tolerate position. Pt fatigued halfway through session sitting unsupported. Poserior back support provided in sitting for remainder of session. Pt completed 9 hole peg test- results below. Pt improved from not being able to manipulate any pegs on day of eval, demonstrating increased pincer grasp and fine motor coordination in B UEs. Pt returned to w/c at end of session. Pt reclined in tilt-in-space w/c. BP 152/72. RN made aware. Pt left in room at end of session, soft call bell in reach.   9 Hole Peg Test R 2 min 46 sec L 7 min 58 sec  Session Three Pt seen for OT therapy session focusing on ADL re-training, functional transfers, and LE strengthening. Pt sitting up in w/c upon arrival, not yet set-up with meal tray for lunch. Pt voiced feeling incredibly fatigued from AM therapy sessions, however, willing to attempt therapy. He was set-up with lunch items, with min A able to open small cap on condiment container and don universal cuff set-up with adapted spoon. Pt self-fed lunch. He then transferred to bed, VCs provided for technique to WB through B LEs during transfer in order to increase independence/ decrease caregiver assist with transfers. With increased time and cuing pt able to complete SB transfer with min  A, however, fatigued quickly, requiring increased assist at end of transfer. +2 with verbal and tactile cuing for return to supine. In supine, pt demonstrated ability  atoctivate B glutes and abdominal muscles to come into modified bridging position- completed x3 trials. Pt's BP in supine with TEDs only BP 141/70. Pt positioned in "chair" function of hospital bed with TEDs and abdominal binder, BP 102/63. Pt requested return to  supine in bed at end of session, returned bed to supine position, pt left with all needs in reach.  Pt educated throughout session regarding benefits of OOB, encouraging pt to get OOB for dinner/ evening time, importance of self- advocacy, progression of removing ACE/TEDs/ abdominal binder, and d/c planning.   Therapy Documentation Precautions:  Precautions Precautions: Cervical, Fall Precaution Comments: Using BLE ACE wraps for BP control at this time. Monitor this Required Braces or Orthoses: Cervical Brace Cervical Brace: Hard collar, At all times Restrictions Weight Bearing Restrictions: No Pain: Pain Assessment Pain Assessment: 0-10 Pain Score: Asleep Pain Type: Acute pain Pain Location: Arm Pain Orientation: Left Pain Radiating Towards: shoulder Pain Descriptors / Indicators: Aching;Sore Pain Frequency: Intermittent Pain Onset: On-going Patients Stated Pain Goal: 0 Pain Intervention(s): Medication (See eMAR)  Function:   Eating Eating               Grooming Oral Care,Brush Teeth, Clean Dentures Activity:             Wash, Rinse, Dry Face Activity          Wash, Rinse, Dry Hands Activity   Assist Level: Set up      Brush, Comb Hair Activity        Shave Activity          Apply Makeup Activity                                                             Bathing Bathing position   Position: Bed  Bathing parts Body parts bathed by patient: Right upper leg;Left upper leg Body parts bathed by helper: Right lower leg;Left lower leg;Buttocks  Bathing assist         Upper Body Dressing/Undressing Upper body dressing   What is the patient wearing?: Pull over shirt/dress     Pull over shirt/dress - Perfomed by patient: Thread/unthread right sleeve;Thread/unthread left sleeve;Put head through opening Pull over shirt/dress - Perfomed by helper: Pull shirt over trunk        Upper body assist Assist Level: 2 helpers       Lower Body  Dressing/Undressing Lower body dressing   What is the patient wearing?: Ted Hose;Non-skid slipper socks;Pants       Pants- Performed by helper: Thread/unthread right pants leg;Thread/unthread left pants leg;Pull pants up/down   Non-skid slipper socks- Performed by helper: Don/doff right sock;Don/doff left sock   Socks - Performed by helper: Don/doff right sock;Don/doff left sock           TED Hose - Performed by helper: Don/doff right TED hose;Don/doff left TED hose  Lower body assist           Bed Mobility Roll left and right activity   Assist level: Max assist (Pt 25 - 49%)  Sit to lying activity   Assist level: Max assist (Pt 25 - 49%)  Lying to sitting activity   Assist level: 2 helpers  Mobility details     Transfers Sit to stand transfer        Chair/bed transfer   Chair/bed transfer method: Lateral scoot Chair/bed transfer assist level: Moderate assist (Pt 50 - 74%/lift or lower) Chair/bed transfer assistive device: Sliding board;Orthosis      Toilet transfer                Tub/shower transfer                Therapy/Group: Individual Therapy  Lewis, Madsen Riddle C 12/04/2014, 4:18 PM

## 2014-12-04 NOTE — Progress Notes (Signed)
Physical Therapy Weekly Progress Note  Patient Details  Name: Carl [name] MRN: [id number] Date of Birth: 1950-12-18  Beginning of progress report period: November 28, 2014 End of progress report period: December 04, 2014  Today's Date: 12/04/2014 PT Individual Time: 1000-1105   PT Individual Time Calculation (min): 65 min    Patient has made steady progress and has met 4 of 5 short term goals.  Pt is currently mod-max A with bed mobility with use of bed controls and leg loops, bed <> w/c transfers with slide board and leg loops, is able to tolerate 4-6 hours OOB in tilt in space w/c and is independent with calling for boosting every 30 minutes, has begun trials of w/c mobility in light weight manual w/c, and is able to tolerate supported standing/WB in standing frame x less than 1 min due to orthostatic BP.  Patient continues to demonstrate the following deficits: Tetraparesis with impaired sensation, strength, motor control and coordination, impaired postural control, balance, impaired activity tolerance/endurance, inability to stand or ambulate without total A and therefore will continue to benefit from skilled PT intervention to enhance overall performance with activity tolerance, balance, postural control, ability to compensate for deficits, functional use of  right upper extremity, right lower extremity, left upper extremity and left lower extremity and coordination.  Patient progressing toward long term goals..  Continue plan of care.  PT Short Term Goals Week 2:  PT Short Term Goal 1 (Week 2): Pt will perform bed mobility rolling and supine <> sit with use of leg loops and max A of one person PT Short Term Goal 1 - Progress (Week 2): Met PT Short Term Goal 2 (Week 2): Pt will perform slideboard transfer bed <> chair on level surface with max A of one person and use of leg loops PT Short Term Goal 2 - Progress (Week 2): Met PT Short Term Goal 3 (Week 2): Pt will trial and perform w/c  mobility in manual w/c x 50' with mod A  PT Short Term Goal 3 - Progress (Week 2): Met PT Short Term Goal 4 (Week 2): Pt will tolerate sitting up OOB x 4-6 hours and be independent with requesting pressure relief every 30 minutes PT Short Term Goal 4 - Progress (Week 2): Met PT Short Term Goal 5 (Week 2): Pt will tolerate standing in frame x 5 minutes for WB and core strengthening during dynamic UE activity PT Short Term Goal 5 - Progress (Week 2): Progressing toward goal Week 3:  PT Short Term Goal 1 (Week 3): Pt will perform bed mobility with use of leg loops and mod A PT Short Term Goal 2 (Week 3): Pt will perform bed <> w/c transfers with consistent mod A (on even surfaces) PT Short Term Goal 3 (Week 3): Pt will perform w/c mobility x 100' with min A in controlled environment PT Short Term Goal 4 (Week 3): Pt will tolerate dynamic standing balance activities and pre-gait activities with max A  PT Short Term Goal 5 (Week 3): Pt will maintain sitting balance during dynamic activities with min A  Skilled Therapeutic Interventions/Progress Updates:   Pt received in bed with socks still donned.  Engaged pt in functional activity of maintaining balance in circle sitting with HOB elevated and sequencing use of UE for doffing R and L sock and donning R and L shoe with min-mod a overall.  Performed transfer to EOB as described below with use of leg loops.   Performed transfers bed >  w/c > mat with slideboard as below with pt demonstrating improved sitting balance and ability to initiate lateral scooting with trunk and UE.  Pt also performed functional oral hygiene in w/c with towel roll at lumbar spine to provide improved trunk position and allow for increased postural control during bilat UE dynamic movement at sink.  Once in gym on mat pt able to maintain static sitting balance with supervision propping on UE and also without UE support.  Pt left with OT for second session.   Therapy  Documentation Precautions:  Precautions Precautions: Cervical, Fall Precaution Comments: Using BLE ACE wraps for BP control at this time. Monitor this Required Braces or Orthoses: Cervical Brace Cervical Brace: Hard collar, At all times Restrictions Weight Bearing Restrictions: No Pain:  Still c/o intermittent pain L forearm with certain movements.  Function:  Bed Mobility Roll left and right activity   Assist level: Max assist (Pt 25 - 49%) Roll left and right assistive device: Bedrails  Sit to lying activity   Assist level: Max assist (Pt 25 - 49%) Sit to lying assistive device: Bedrails  Lying to sitting activity   Assist level: Moderate assist (Pt 50 - 74%, lift 2 legs) Lying to sitting assistive device: Bedrails;HOB elevated;Other (comment) (leg loops)  Mobility details Bed mobility details: Tactile cues for weight shifting;Tactile cues for sequencing;Tactile cues for posture;Verbal cues for techniques;Verbal cues for sequencing;Manual facilitation for weight shifting   Transfers Sit to stand transfer        Chair/bed transfer   Chair/bed transfer method: Lateral scoot Chair/bed transfer assist level: Moderate assist (Pt 50 - 74%/lift or lower) Chair/bed transfer assistive device: Sliding board;Orthosis   Chair/bed transfer details: Tactile cues for initiation;Tactile cues for sequencing;Tactile cues for posture;Verbal cues for sequencing;Verbal cues for technique;Verbal cues for precautions/safety;Manual facilitation for weight shifting   Toilet transfer                Car transfer         Locomotion Ambulation          Walk 10 feet activity      Walk 50 feet with 2 turns activity      Walk 150 feet activity      Walk 10 feet on uneven surfaces activity      Stairs          Walk up/down 1 step activity        Walk up/down 4 steps activity      Walk up/down 12 steps activity      Pick up small objects from floor      Wheelchair        Assist Level: Dependent (Pt equals 0%)  Wheel 50 feet with 2 turns activity   Assist Level: Dependent (Pt equals 0%)  Wheel 150 feet activity   Assist Level: Dependent (Pt equals 0%)    Cognition Comprehension Comprehension assist level: Follows complex conversation/direction with no assist  Expression Expression assist level: Expresses complex ideas: With extra time/assistive device  Social Interaction Social Interaction assist level: Interacts appropriately with others with medication or extra time (anti-anxiety, antidepressant).  Problem Solving Problem solving assist level: Solves complex problems: With extra time  Memory Memory assist level: Complete Independence: No helper    Therapy/Group: Individual Therapy   Raylene Everts [facility] 12/04/2014, 12:15 PM

## 2014-12-04 NOTE — Progress Notes (Signed)
  Echocardiogram 2D Echocardiogram has been performed.  Cathie Beams 12/04/2014, 4:09 PM

## 2014-12-04 NOTE — Progress Notes (Signed)
Carl [name] PHYSICAL MEDICINE & REHABILITATION     PROGRESS NOTE    Subjective/Complaints: Pt had a good day yesterday. Happy to rest a bit.   ROS: Pt denies  , rash/itching, headache, blurred or double vision, nausea, vomiting, abdominal pain, diarrhea, chest pain, shortness of breath, palpitations, dysuria, dizziness,  back pain, bleeding, anxiety, or depression    Objective: Vital Signs: Blood pressure 98/59, pulse 39, temperature 98.7 F (37.1 C), temperature source Oral, resp. rate 18, height 5\' 8"  (1.727 m), weight 77.61 kg (171 lb 1.6 oz), SpO2 97 %. No results found.  Recent Labs  12/04/14 0425  WBC 7.0  HGB 12.2*  HCT 37.1*  PLT 300    Recent Labs  12/04/14 0425  NA 132*  K 4.3  CL 97*  GLUCOSE 106*  BUN 22*  CREATININE 0.97  CALCIUM 9.0   CBG (last 3)  No results for input(s): GLUCAP in the last 72 hours.  Wt Readings from Last 3 Encounters:  11/29/14 77.61 kg (171 lb 1.6 oz)  11/16/14 90.719 kg (200 lb)    Physical Exam:  Constitutional: He is oriented to person, place, and time. He appears well-developed and well-nourished.  HENT:  Head: Normocephalic and atraumatic.    Eyes: Conjunctivae are normal. Pupils are equal, round, and reactive to light.  Neck:  C Collar.  Cardiovascular: bradycardia and regular rhythm. Respiratory: Effort normal. No respiratory distress. He has no wheezes.  GI: Soft. Bowel sounds +.   Genitourinary: foley out Musculoskeletal: He exhibits tenderness. He exhibits no edema.  Neurological: He is alert and oriented to person, place, and time. No cranial nerve deficit. Coordination normal.  Patient has normal sensation proximal to C6 dermatome. Patient has reduced sensation to pinprick but is able to identify the area touched at bilateral C6 through sacral levels Patient has decreased tone in bilateral Lower extremities 4/5 bilateral deltoids, biceps, 4- triceps, 4 wrist extensors, trace right HI, 1+ to 2- left HI,  right HF, KE and ADF/APF are 1 to 2-/5. Tr 1/5 left hip flexors and knee extensors ankle dorsiflexors and plantar flexors  Skin: Skin is warm and dry.  Cervical incision intact   Assessment/Plan: 1. 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 Physiatrist is providing close team supervision and 24 hour management of active medical problems listed below. Physiatrist and rehab team continue to assess barriers to discharge/monitor patient progress toward functional and medical goals.  FIM: Function - Bathing Position: Bed Body parts bathed by patient: Right upper leg, Left upper leg Body parts bathed by helper: Right lower leg, Left lower leg Assist Level: Assistive device Assistive Device Comment: Hospital bed functions  Function- Upper Body Dressing/Undressing What is the patient wearing?: Pull over shirt/dress Pull over shirt/dress - Perfomed by patient: Thread/unthread right sleeve, Thread/unthread left sleeve Pull over shirt/dress - Perfomed by helper: Put head through opening, Pull shirt over trunk, Thread/unthread left sleeve, Thread/unthread right sleeve Assist Level: 2 helpers Set up : To obtain clothing/put away Function - Lower Body Dressing/Undressing What is the patient wearing?: Ted Hose, Non-skid slipper socks, Pants Position: Bed Pants- Performed by helper: Thread/unthread right pants leg, Thread/unthread left pants leg, Pull pants up/down Non-skid slipper socks- Performed by helper: Don/doff right sock, Don/doff left sock Socks - Performed by helper: Don/doff right sock, Don/doff left sock Shoes - Performed by helper: Don/doff right shoe, Don/doff left shoe, Fasten right, Fasten left  TED Hose - Performed by helper: Don/doff right TED hose, Don/doff left TED hose Assist Level: 2 Helpers  Function - Toileting Toileting activity did not occur: No continent  bowel/bladder event Toileting steps completed by helper: Adjust clothing prior to toileting, Performs perineal hygiene, Adjust clothing after toileting Assist level: Two helpers  Function - Archivist transfer activity did not occur: Safety/medical concerns  Function - Chair/bed transfer Chair/bed transfer method: Lateral scoot Chair/bed transfer assist level: 2 helpers Chair/bed transfer assistive device: Sliding board, Orthosis Chair/bed transfer details: Manual facilitation for weight shifting, Manual facilitation for placement  Function - Locomotion: Wheelchair Will patient use wheelchair at discharge?: Yes Type: Manual (standard, not tilt in space) Max wheelchair distance: 156 Assist Level: Supervision or verbal cues, Touching or steadying assistance (Pt > 75%) Assist Level: Touching or steadying assistance (Pt > 75%), Supervision or verbal cues Assist Level: Supervision or verbal cues Function - Locomotion: Ambulation Ambulation activity did not occur: Safety/medical concerns Walk 10 feet activity did not occur: Safety/medical concerns Walk 50 feet with 2 turns activity did not occur: Safety/medical concerns Walk 150 feet activity did not occur: Safety/medical concerns Walk 10 feet on uneven surfaces activity did not occur: Safety/medical concerns  Function - Comprehension Comprehension: Auditory Comprehension assist level: Follows complex conversation/direction with no assist  Function - Expression Expression: Verbal Expression assist level: Expresses complex ideas: With no assist  Function - Social Interaction Social Interaction assist level: Interacts appropriately with others - No medications needed.  Function - Problem Solving Problem solving assist level: Solves complex problems: Recognizes & self-corrects  Function - Memory Memory assist level: Complete Independence: No helper Patient normally able to recall (first 3 days only): Location of own  room, Staff names and faces, That he or she is in a hospital, Current season Medical Problem List and Plan: 1. 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 DVT Prophylaxis/Anticoagulation: Pharmaceutical: Lovenox to continue  -dopplers negative 3. Pain Management: Continue Lyrica BID for neuropathic pain.tramdol for mod and oxy for severe pain 4. Mood: Team to provide ego support.  .    -continue to provide positive reinforcement 5. Neuropsych: This patient is capable of making decisions on his own behalf. 6. Skin/Wound Care: Routine pressure relief measures.  7. Fluids/Electrolytes/Nutrition: 100% meals. Pushing fluids 8. QQP:YPPJKDTO negatie. afebrile  -ucx + for klebsiella oxytoca-- septra completed   9. CV: abd binder/compression stockings to maintain sitting/upright bp, check orthostatic vitals  -continued bradycardia due to lost sympathetic tone--asymptomatic. EKG with bradycardia.   -likely worsened by midodrine---dc and try florinef for bp  -encourage fluids  -bp's better on midodrine  0.5mg --watch for elevated sbp  -limit pain meds (as possible)  - added antivert for vestibular component of dizziness, treatment limited by collar 10. Acutre renal failure---volume depletion--resolved   - continue to push po  - BUN fairly stable---I personally reviewed the patient's labs today.  11. Neurogenic bladder---I/O caths---no urge or sense of bladder fullness yet  -continue to keep volumes 300-500cc 12. Neurogenic bowel: working on qam bowel program  LOS (Days) 14 A FACE TO FACE EVALUATION WAS PERFORMED  Carl [name] T 12/04/2014 7:12 AM

## 2014-12-05 ENCOUNTER — Inpatient Hospital Stay (HOSPITAL_COMMUNITY): Payer: Federal, State, Local not specified - PPO | Admitting: Physical Therapy

## 2014-12-05 ENCOUNTER — Inpatient Hospital Stay (HOSPITAL_COMMUNITY): Payer: Federal, State, Local not specified - PPO | Admitting: Occupational Therapy

## 2014-12-05 MED ORDER — FLEET ENEMA 7-19 GM/118ML RE ENEM
1.0000 | ENEMA | Freq: Every day | RECTAL | Status: DC
  Administered 2014-12-06 – 2014-12-19 (×14): 1 via RECTAL
  Filled 2014-12-05 (×15): qty 1

## 2014-12-05 MED ORDER — PREGABALIN 50 MG PO CAPS
50.0000 mg | ORAL_CAPSULE | Freq: Two times a day (BID) | ORAL | Status: DC
  Administered 2014-12-05 – 2014-12-19 (×28): 50 mg via ORAL
  Filled 2014-12-05 (×28): qty 1

## 2014-12-05 NOTE — Progress Notes (Signed)
Occupational Therapy Weekly Progress Note  Patient Details  Name: Carl [name] MRN: [id number] Date of Birth: [birth year]  Beginning of progress report period: November 28, 2014 End of progress report period: December 05, 2014  Today's Date: 12/05/2014 OT Individual Time: 3734-2876 and 1300-1400 OT Individual Time Calculation (min): 55 min and 60 min   Patient has met 3 of 3 short term goals.  Pt making slow progress towards OT goals. He has been limited by fatigue and BP regulation with mobility. Focus on pt directing care and family training this week as pt's wife plans to take pt home.   Patient continues to demonstrate the following deficits: abnormal posture, acute pain, muscle weakness (generalized), pain in joint and paraplegia at level C5-C6  and therefore will continue to benefit from skilled OT intervention to enhance overall performance with BADL and Reduce care partner burden.  Patient progressing toward long term goals..  Continue plan of care.   OT Short Term Goals Week 2:  OT Short Term Goal 1 (Week 2): Pt will thread B UEs into shirt in order to increase independence with ADLs OT Short Term Goal 1 - Progress (Week 2): Met OT Short Term Goal 2 (Week 2): Pt will place toothpaste on toothbrush with set-up to open container during grooming task to increase independence with ADLs OT Short Term Goal 2 - Progress (Week 2): Met OT Short Term Goal 3 (Week 2): Pt will manipulate water cup and bring to mouth independently in order to increase independence with ADLs.  OT Short Term Goal 3 - Progress (Week 2): Met Week 3:  OT Short Term Goal 1 (Week 3): Pt will thread B LEs into pants with set-up assist in circle sit position OT Short Term Goal 2 (Week 3): Pt will complete functional transfers with min A. OT Short Term Goal 3 (Week 3): Pt will direct caregiver with sliding board transfer with no VCs. OT Short Term Goal 4 (Week 3): Pt will bathe LB with min A following set-up of circle sit  position.   Skilled Therapeutic Interventions/Progress Updates:    Session One: Pt seen for OT ADL bathing and dressing session. Pt in supine upon arrival, agreeable to tx session. Shaving task completed at bed level with total A with cervical brace doffed in supine for positioning. Pt completed bathing task from supine level, see required assist levels below. Assist required to place position in modified circle sit position. In circle sit position, pt participated in threading B LEs into pants with VCs for technique and min A for clothing management. +2 used to roll pt to pull pants up. ACE wraps and TED hose donned. Pt left in supine with handoff to PT.   Session Two: Pt in supine upon arrival with wife present, agreeable to tx session. Education provided regarding having wife come in more often for tx session for hands on training. [name] provided with home measurement sheet and questions answered regarding car transfers and family training. Pt transferred to EOB with max A, focusing on pt directing care. Pt able to sit EOB with B UE support, maintaining slouched position, unable to come into full upright sitting due to decreased core strength/ stability. He completed sliding board transfer with +2 assist to stabilize equipment. He demonstrated functional sitting balance seated on bariatric BSC with back rest. While pt sat on BSC remainder of shaving task completed total A. At end of session, pt requested return to bed. Max A of 1 for sliding board transfer  back to bed and max A for return to supine. Pt left in supine at end of session, all needs in reach.   Therapy Documentation Precautions:  Precautions Precautions: Cervical, Fall Precaution Comments: Using BLE ACE wraps for BP control at this time. Monitor this Required Braces or Orthoses: Cervical Brace Cervical Brace: Hard collar, At all times Restrictions Weight Bearing Restrictions: No Pain:  No/ Denies pain  Function:    Grooming Oral Care,Brush Teeth, Clean Dentures Activity:             Wash, Rinse, Dry Face Activity          Wash, Rinse, Dry Hands Activity          Brush, Comb Hair Activity        Shave Activity   Assist Level: Helper performed activity      Apply Makeup Activity                                                             Bathing Bathing position   Position: Bed  Bathing parts Body parts bathed by patient: Right upper leg;Left upper leg;Right arm;Left arm;Chest;Abdomen;Right lower leg Body parts bathed by helper: Back;Left lower leg  Bathing assist   Assistive Device Comment: Hospital bed functions; declined buttock hygiene, RN performed cathing during session and performed perineal hygiene.      Upper Body Dressing/Undressing Upper body dressing   What is the patient wearing?: Pull over shirt/dress     Pull over shirt/dress - Perfomed by patient: Thread/unthread right sleeve;Thread/unthread left sleeve Pull over shirt/dress - Perfomed by helper: Put head through opening;Pull shirt over trunk        Upper body assist Assist Level: 2 helpers       Lower Body Dressing/Undressing Lower body dressing   What is the patient wearing?: Ted Hose;Pants       Pants- Performed by helper: Thread/unthread right pants leg;Thread/unthread left pants leg;Pull pants up/down                   TED Hose - Performed by helper: Don/doff right TED hose;Don/doff left TED hose  Lower body assist          Bed Mobility Roll left and right activity   Assist level: 2 helpers  Sit to lying activity      Lying to sitting activity      Mobility details     Transfers Sit to stand transfer        Chair/bed transfer     Chair/bed transfer assist level: Moderate assist (Pt 50 - 74%/lift or lower) Chair/bed transfer assistive device: Sliding board;Orthosis   Chair/bed transfer details: Tactile cues for initiation;Tactile cues for sequencing;Tactile cues for  posture;Verbal cues for sequencing;Verbal cues for technique;Verbal cues for precautions/safety;Manual facilitation for weight shifting  Toilet transfer   Toilet transfer assistive device: Sliding board       Assist level to toilet: 2 helpers Assist level from toilet: Maximal assist (Pt 25 - 49%/lift and lower)  Tub/shower transfer               Therapy/Group: Individual Therapy  Lewis, Garlen Reinig C 12/05/2014, 9:59 AM

## 2014-12-05 NOTE — Progress Notes (Signed)
Physical Therapy Session Note  Patient Details  Name: Carl [name] MRN: [id number] Date of Birth: [birth year]  Today's Date: 12/05/2014 PT Individual Time: 0935-1030 PT Individual Time Calculation (min): 55 min   Short Term Goals: Week 3:  PT Short Term Goal 1 (Week 3): Pt will perform bed mobility with use of leg loops and mod A PT Short Term Goal 2 (Week 3): Pt will perform bed <> w/c transfers with consistent mod A (on even surfaces) PT Short Term Goal 3 (Week 3): Pt will perform w/c mobility x 100' with min A in controlled environment PT Short Term Goal 4 (Week 3): Pt will tolerate dynamic standing balance activities and pre-gait activities with max A  PT Short Term Goal 5 (Week 3): Pt will maintain sitting balance during dynamic activities with min A  Skilled Therapeutic Interventions/Progress Updates:   Session focused on sitting balance, functional transfers, wheelchair mobility, standing tolerance, and activity tolerance. Patient received in bed with OT, performed rolling with +2A for LB dressing with ace wraps and THTs donned. Patient transferred to edge of bed with [facility] elevated and use of rail and performed sitting balance with BUE supported and supervision edge of bed while therapist donned shoes. Performed slide board transfer bed > manual wheelchair with mod A overall and improved BLE weightbearing during transfer to assist in hip clearance/scooting. Patient propelled wheelchair using BUE x 160 ft to gym with S-min A for turning, where therapist added theraband to wheelchair rims to increase patient's grip as he reported that wheelchair gloves provided previously were too small. Patient verbalized improved ability and efficiency with propulsion after theraband added. Seated BP 104/58, HR 44. Patient agreeable to standing frame. Patient tolerated standing frame for about 1 min before reporting lightheadedness, BP 78/46, HR 47. Patient propelled back towards room with supervision and  agreeable to sitting up in wheelchair for 30 min until next session. Patient left sitting in manual wheelchair with quick release belt on and soft call bell in lap, RN notified of patient position.   Therapy Documentation Precautions:  Precautions Precautions: Cervical, Fall Precaution Comments: Using BLE ACE wraps for BP control at this time. Monitor this Required Braces or Orthoses: Cervical Brace Cervical Brace: Hard collar, At all times Restrictions Weight Bearing Restrictions: No Vital Signs: Therapy Vitals Pulse Rate: (!) 47 BP: (!) 78/46 mmHg Patient Position (if appropriate): Standing Pain: Pain Assessment Pain Assessment: No/denies pain   Function:  Toileting Toileting             Bed Mobility Roll left and right activity   Assist level: 2 helpers Roll left and right assistive device: Bedrails  Sit to lying activity        Lying to sitting activity   Assist level: Maximal assist (Pt 25 - 49%) Lying to sitting assistive device: Bedrails;HOB elevated  Mobility details Bed mobility details: Tactile cues for weight shifting;Tactile cues for sequencing;Tactile cues for posture;Verbal cues for techniques;Verbal cues for sequencing;Manual facilitation for weight shifting   Transfers Sit to stand transfer   Sit to stand assist level: Total assist (Pt < 25%) Sit to stand assistive device: Other (standing frame)  Chair/bed transfer   Chair/bed transfer method: Lateral scoot Chair/bed transfer assist level: Moderate assist (Pt 50 - 74%/lift or lower) Chair/bed transfer assistive device: Sliding board;Orthosis   Chair/bed transfer details: Tactile cues for initiation;Tactile cues for sequencing;Tactile cues for posture;Verbal cues for sequencing;Verbal cues for technique;Verbal cues for precautions/safety;Manual facilitation for weight shifting   Toilet transfer  Car transfer          Locomotion Ambulation          Walk 10 feet activity       Walk 50 feet with 2 turns activity      Walk 150 feet activity      Walk 10 feet on uneven surfaces activity      Stairs          Walk up/down 1 step activity        Walk up/down 4 steps activity      Walk up/down 12 steps activity      Pick up small objects from floor      Wheelchair   Type: Manual Max wheelchair distance: 150 ft Assist Level: Supervision or verbal cues;Touching or steadying assistance (Pt > 75%)  Wheel 50 feet with 2 turns activity   Assist Level: Touching or steadying assistance (Pt > 75%)  Wheel 150 feet activity   Assist Level: Supervision or verbal cues;Touching or steadying assistance (Pt > 75%)   Cognition Comprehension Comprehension assist level: Follows complex conversation/direction with no assist  Expression Expression assist level: Expresses complex ideas: With extra time/assistive device  Social Interaction Social Interaction assist level: Interacts appropriately with others with medication or extra time (anti-anxiety, antidepressant).  Problem Solving Problem solving assist level: Solves complex problems: With extra time  Memory Memory assist level: Complete Independence: No helper    Therapy/Group: Individual Therapy  Kerney Elbe 12/05/2014, 10:53 AM

## 2014-12-05 NOTE — Progress Notes (Signed)
Recreational Therapy Session Note  Patient Details  Name: Carl [name] MRN: [id number] Date of Birth: 09/28/1950 Today's Date: 12/05/2014  Pain: no c/o Skilled Therapeutic Interventions/Progress Updates: Session focused on slide board transfers to & from [facility], w/c & bed & toileting.  Pt reported need to have a BM at beginning of session as well as significant fatigue.  Pt performed slide-board transfers on/off BSC with +2 assist, slide board transfers to w/c & bed with mod assist.  Therapy/Group: Co-Treatment   Jaynie Hitch 12/05/2014, 2:24 PM

## 2014-12-05 NOTE — Progress Notes (Signed)
[place] PHYSICAL MEDICINE & REHABILITATION     PROGRESS NOTE    Subjective/Complaints: Felt very tired and weak at end of day yesterday. Echo performed yesterday afternoon. Left forearm sore, feels that he strained it while throwing the ball in therapy.  ROS: Pt denies  , rash/itching, headache, blurred or double vision, nausea, vomiting, abdominal pain, diarrhea, chest pain, shortness of breath, palpitations, dysuria, dizziness,  back pain, bleeding, anxiety, or depression    Objective: Vital Signs: Blood pressure 102/55, pulse 37, temperature 98.2 F (36.8 C), temperature source Oral, resp. rate 16, height  (1.727 m), weight 77.61 kg (171 lb 1.6 oz), SpO2 97 %. No results found.  Recent Labs  12/04/14 0425  WBC 7.0  HGB 12.2*  HCT 37.1*  PLT 300    Recent Labs  12/04/14 0425  NA 132*  K 4.3  CL 97*  GLUCOSE 106*  BUN 22*  CREATININE 0.97  CALCIUM 9.0   CBG (last 3)  No results for input(s): GLUCAP in the last 72 hours.  Wt Readings from Last 3 Encounters:  11/29/14 77.61 kg (171 lb 1.6 oz)  11/16/14 90.719 kg (200 lb)    Physical Exam:  Constitutional: He is oriented to person, place, and time. He appears well-developed and well-nourished.  HENT:  Head: Normocephalic and atraumatic.    Eyes: Conjunctivae are normal. Pupils are equal, round, and reactive to light.  Neck:  C Collar.  Cardiovascular: bradycardia and regular rhythm. Respiratory: Effort normal. No respiratory distress. He has no wheezes.  GI: Soft. Bowel sounds +.   Genitourinary: foley out Musculoskeletal: He exhibits tenderness along extensor compartment of left arm. He exhibits no edema.  Neurological: He is alert and oriented to person, place, and time. No cranial nerve deficit. Coordination normal.  Patient has normal sensation proximal to C6 dermatome. Patient has reduced sensation to pinprick but is able to identify the area touched at bilateral C6 through sacral  levels Patient has decreased tone in bilateral Lower extremities 4/5 bilateral deltoids, biceps, 4- triceps, 4 wrist extensors, trace right HI, 1+ to 2- left HI, right HF, KE and ADF/APF are 1 to 2-/5. Tr 1/5 left hip flexors and knee extensors ankle dorsiflexors and plantar flexors  Skin: Skin is warm and dry.  Cervical incision intact   Assessment/Plan: 1. 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 Physiatrist is providing close team supervision and 24 hour management of active medical problems listed below. Physiatrist and rehab team continue to assess barriers to discharge/monitor patient progress toward functional and medical goals.  FIM: Function - Bathing Position: Bed Body parts bathed by patient: Right upper leg, Left upper leg Body parts bathed by helper: Right lower leg, Left lower leg, Buttocks Assist Level: Assistive device Assistive Device Comment: Hospital bed functions  Function- Upper Body Dressing/Undressing What is the patient wearing?: Pull over shirt/dress Pull over shirt/dress - Perfomed by patient: Thread/unthread right sleeve, Thread/unthread left sleeve, Put head through opening Pull over shirt/dress - Perfomed by helper: Pull shirt over trunk Assist Level: 2 helpers Set up : To obtain clothing/put away Function - Lower Body Dressing/Undressing What is the patient wearing?: Ted Hose, Non-skid slipper socks, Pants Position: Bed Pants- Performed by helper: Thread/unthread right pants leg, Thread/unthread left pants leg, Pull pants up/down Non-skid slipper socks- Performed by helper: Don/doff right sock, Don/doff left sock Socks - Performed by patient: Don/doff right sock, Don/doff  left sock Socks - Performed by helper: Don/doff right sock, Don/doff left sock Shoes - Performed by patient: Don/doff right shoe, Don/doff left shoe Shoes - Performed by helper:  Don/doff right shoe, Don/doff left shoe, Fasten right, Fasten left TED Hose - Performed by helper: Don/doff right TED hose, Don/doff left TED hose Assist Level: Touching or steadying assistance (Pt > 75%)  Function - Toileting Toileting activity did not occur: No continent bowel/bladder event Toileting steps completed by helper: Adjust clothing prior to toileting, Performs perineal hygiene, Adjust clothing after toileting Assist level: Two helpers  Function - Archivist transfer activity did not occur: Safety/medical concerns  Function - Chair/bed transfer Chair/bed transfer method: Lateral scoot Chair/bed transfer assist level: Moderate assist (Pt 50 - 74%/lift or lower) Chair/bed transfer assistive device: Sliding board, Orthosis Chair/bed transfer details: Tactile cues for initiation, Tactile cues for sequencing, Tactile cues for posture, Verbal cues for sequencing, Verbal cues for technique, Verbal cues for precautions/safety, Manual facilitation for weight shifting  Function - Locomotion: Wheelchair Will patient use wheelchair at discharge?: Yes Type: Manual (standard, not tilt in space) Max wheelchair distance: 156 Assist Level: Dependent (Pt equals 0%) Assist Level: Dependent (Pt equals 0%) Assist Level: Dependent (Pt equals 0%) Function - Locomotion: Ambulation Ambulation activity did not occur: Safety/medical concerns Walk 10 feet activity did not occur: Safety/medical concerns Walk 50 feet with 2 turns activity did not occur: Safety/medical concerns Walk 150 feet activity did not occur: Safety/medical concerns Walk 10 feet on uneven surfaces activity did not occur: Safety/medical concerns  Function - Comprehension Comprehension: Auditory Comprehension assist level: Follows complex conversation/direction with no assist  Function - Expression Expression: Verbal Expression assist level: Expresses complex ideas: With extra time/assistive device  Function -  Social Interaction Social Interaction assist level: Interacts appropriately with others with medication or extra time (anti-anxiety, antidepressant).  Function - Problem Solving Problem solving assist level: Solves complex problems: With extra time  Function - Memory Memory assist level: Complete Independence: No helper Patient normally able to recall (first 3 days only): Location of own room, Staff names and faces, That he or she is in a hospital, Current season Medical Problem List and Plan: 1. 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 DVT Prophylaxis/Anticoagulation: Pharmaceutical: Lovenox to continue  -dopplers negative 3. Pain Management: Continue Lyrica BID for neuropathic pain.tramdol for mod and oxy for severe pain 4. Mood: Team to provide ego support.  .    -continue to provide positive reinforcement 5. Neuropsych: This patient is capable of making decisions on his own behalf. 6. Skin/Wound Care: Routine pressure relief measures.  7. Fluids/Electrolytes/Nutrition: 100% meals. Encouraging fluids 8. WUJ:[id number] negatie. afebrile  -ucx + for klebsiella oxytoca-- septra completed   9. CV/bradycardia: abd binder/compression stockings to maintain sitting/upright bp,    -continued bradycardia due to lost sympathetic tone--remains asymptomatic. EKG with bradycardia.  -spoke with cardiology (Dr. Delton See) who recommended ECHO---I have reviewed ECHO and it is completely normal---he has not been symptomatic from bradycardia, so we will continue manage conservatively  -encourage fluids  -have added florinef to better sustain BP's---they looked better yesterday  -limit pain meds (as possible)  -continue prn antivert for vestibular component of dizziness, treatment limited by collar 10. Acutre renal failure---volume depletion--resolved   -  continue to push po  - BUN fairly stable 11. Neurogenic bladder---I/O caths---no urge or sense of  bladder fullness yet  -continue to keep volumes 300-500cc 12. Neurogenic bowel: working on qam bowel program  LOS (Days) 15 A FACE TO FACE EVALUATION WAS PERFORMED  Carl [name] T 12/05/2014 7:59 AM

## 2014-12-05 NOTE — Plan of Care (Signed)
Problem: SCI BLADDER ELIMINATION Goal: RH STG MANAGE BLADDER WITH ASSISTANCE STG Manage Bladder With Max Assistance  Outcome: Not Progressing I&O cath needed

## 2014-12-05 NOTE — Progress Notes (Signed)
Physical Therapy Session Note  Patient Details  Name: Carl [name] MRN: [id number] Date of Birth: [birth year]  Today's Date: 12/05/2014 PT Individual Time: 7903-8333 PT Individual Time Calculation (min): 60 min   Short Term Goals: Week 2:  PT Short Term Goal 1 (Week 2): Pt will perform bed mobility rolling and supine <> sit with use of leg loops and max A of one person PT Short Term Goal 1 - Progress (Week 2): Met PT Short Term Goal 2 (Week 2): Pt will perform slideboard transfer bed <> chair on level surface with max A of one person and use of leg loops PT Short Term Goal 2 - Progress (Week 2): Met PT Short Term Goal 3 (Week 2): Pt will trial and perform w/c mobility in manual w/c x 50' with mod A  PT Short Term Goal 3 - Progress (Week 2): Met PT Short Term Goal 4 (Week 2): Pt will tolerate sitting up OOB x 4-6 hours and be independent with requesting pressure relief every 30 minutes PT Short Term Goal 4 - Progress (Week 2): Met PT Short Term Goal 5 (Week 2): Pt will tolerate standing in frame x 5 minutes for WB and core strengthening during dynamic UE activity PT Short Term Goal 5 - Progress (Week 2): Progressing toward goal  Skilled Therapeutic Interventions/Progress Updates:   Pt received in manual w/c; pt reporting need to have BM and also reporting significant fatigue but willing to attempt to have BM sitting upright on BSC.  Performed transfers w/c <> BSC with slideboard as below.  Performed lateral leans on BSC with +2 A for clothing management and hygiene.  After having small BM on BSC pt reporting increased fatigue and requesting to be placed in bed on L side to attempt to finish BM.  Performed transfer w/c > bed and sit > supine, rolling to L side as below.  Pt left in sidelying with wife present.  Discussed with pt and wife continued goals for therapy and initiated discussion with wife regarding home set up and possible modifications, ramp construction, possibility of home eval,  having family take measurements of doorways and vehicles, f/u therapy and equipment recommendations.  Wife states ramp is in process of being built but declined home eval and feels it would be too emotional to go home and then have to come back to hospital.  Wife is willing to take measurements, take pictures of the home and be present for therapy sessions in the am to begin hands on training and education.  Will continue to discuss with team and pt and family.  Therapy Documentation Precautions:  Precautions Precautions: Cervical, Fall Precaution Comments: Using BLE ACE wraps for BP control at this time. Monitor this Required Braces or Orthoses: Cervical Brace Cervical Brace: Hard collar, At all times Restrictions Weight Bearing Restrictions: No Vital Signs: Therapy Vitals Pulse Rate: (!) 47 BP: (!) 78/46 mmHg Patient Position (if appropriate): Standing Pain: Pain Assessment Pain Assessment: No/denies pain  Function:  Toileting Toileting     Toileting steps completed by helper: Adjust clothing prior to toileting;Performs perineal hygiene;Adjust clothing after toileting   Assist level: Two helpers   Bed Mobility Roll left and right activity   Assist level: Touching or steadying assistance (Pt > 75%, lift 1 leg) Roll left and right assistive device: Bedrails  Sit to lying activity        Lying to sitting activity   Assist level: 2 helpers Lying to sitting assistive device: Bedrails;HOB elevated  Mobility  details Bed mobility details: Tactile cues for sequencing;Verbal cues for techniques;Verbal cues for sequencing;Manual facilitation for weight shifting;Tactile cues for initiation   Transfers Sit to stand transfer   Sit to stand assist level: Total assist (Pt < 25%) Sit to stand assistive device: Other (standing frame)  Chair/bed transfer   Chair/bed transfer method: Lateral scoot Chair/bed transfer assist level: Moderate assist (Pt 50 - 74%/lift or lower) Chair/bed  transfer assistive device: Sliding board;Orthosis   Chair/bed transfer details: Tactile cues for initiation;Tactile cues for sequencing;Tactile cues for posture;Verbal cues for sequencing;Verbal cues for technique;Verbal cues for precautions/safety;Manual facilitation for weight shifting   Toilet transfer   Toilet transfer assistive device: Sliding board   Assist level to toilet: Maximal assist (Pt 25 - 49%/lift and lower) Assist level from toilet: 2 helpers      Car transfer          Locomotion Ambulation          Walk 10 feet activity      Walk 50 feet with 2 turns activity      Walk 150 feet activity      Walk 10 feet on uneven surfaces activity      Stairs          Walk up/down 1 step activity        Walk up/down 4 steps activity      Walk up/down 12 steps activity      Pick up small objects from floor      Wheelchair   Type: Manual Max wheelchair distance: 150 ft Assist Level: Supervision or verbal cues;Touching or steadying assistance (Pt > 75%)  Wheel 50 feet with 2 turns activity   Assist Level: Touching or steadying assistance (Pt > 75%)  Wheel 150 feet activity   Assist Level: Supervision or verbal cues;Touching or steadying assistance (Pt > 75%)   Cognition Comprehension Comprehension assist level: Follows complex conversation/direction with no assist  Expression Expression assist level: Expresses complex ideas: With extra time/assistive device  Social Interaction Social Interaction assist level: Interacts appropriately with others with medication or extra time (anti-anxiety, antidepressant).  Problem Solving Problem solving assist level: Solves complex problems: With extra time  Memory Memory assist level: Complete Independence: No helper    Therapy/Group: Individual Therapy and Co-Treatment  Raylene Everts Faucette 12/05/2014, 12:06 PM

## 2014-12-05 NOTE — Patient Care Conference (Signed)
Inpatient RehabilitationTeam Conference and Plan of Care Update Date: 12/05/2014   Time: 2:00 PM    Patient Name: Carl [name]      Medical Record Number: [id number]  Date of Birth: 1950/12/03 Sex: Male         Room/Bed: 4M07C/4M07C-01 Payor Info: Payor: [employer] / Plan: [employer] PPO / Product Type: *No Product type* /    Admitting Diagnosis: C5-6 FX WITH INCOMPLETE SCI   Admit Date/Time:  11/20/2014  5:57 PM Admission Comments: No comment available   Primary Diagnosis:  C6 spinal cord injury Principal Problem: C6 spinal cord injury  Patient Active Problem List   Diagnosis Date Noted  . Orthostatic hypotension 11/30/2014  . C6 spinal cord injury 11/20/2014  . Neurogenic bowel 11/20/2014  . Neurogenic bladder 11/20/2014  . Fever of unknown origin   . Quadriplegia 11/14/2014  . C5 vertebral fracture 11/09/2014    Expected Discharge Date: Expected Discharge Date: 12/19/14  Team Members Present: Physician leading conference: Dr. Faith Rogue Social Worker Present: Amada Jupiter, LCSW Nurse Present: Carmie End, RN PT Present: Edman Circle, PT OT Present: Donzetta Kohut, Lorane Gell, OT SLP Present: Feliberto Gottron, SLP PPS Coordinator present : Tora Duck, RN, CRRN     Current Status/Progress Goal Weekly Team Focus  Medical   bp/hr low. echo normal. florinef added for bp. still fatiguing alot  improve pm therapy tolerance  bp/hr assessment//rx   Bowel/Bladder   incont of bowel- bowel program q6am. I&O cath q 6hrs, no void  manage bowel and bladder Max/total assist  establish bowel program and educate on bowel and bladder regimen   Swallow/Nutrition/ Hydration             ADL's   Mod-max A bathing/ dressing bed level; min-mod even level transfers using sliding board; set-up feeding and grooming  Min A overall  Functional transfers; bed mobility; sitting balance; activity tolerance   Mobility   Mod-max A for bed mobility, slideboard transfers.   Initiated manual w/c training short distances min A.  Still requires total A in standing frame for standing short duration  min-mod A w/c level  Postural control and balance, UE and LE strength, decreasing assistance for transfers, standing tolerance and WB through LE   Communication             Safety/Cognition/ Behavioral Observations            Pain   scheduled ultram.   pain oless than or equal to 4 on a scale of 0-10  assess pain q shift   Skin   incision to anteroir neck OTA; foam to sacrum stage II   free of further skin breakdown mod assist  turn/boost q2 hrs, assess skin qshift    Rehab Goals Patient on target to meet rehab goals: Yes *See Care Plan and progress notes for long and short-term goals.  Barriers to Discharge: profound deficits, bowel program off    Possible Resolutions to Barriers:  contineu to modify bowel program, family ed, NMR    Discharge Planning/Teaching Needs:  home with wife to provide 24/7 assistance      Team Discussion:  Cardiac issues/ echo completed and OK/ continue monitor.  BP some improved but still with some fluctuation.  B/b program still being worked on.  Very fatigued the past couple of days - MD aware.  Wife to begin family ed this week and she is gathering home measurements.  Planning w/c eval for next week.  Pt and  wife very tearful with discussion of home set up.  Continue to provide support.  Revisions to Treatment Plan:  None   Continued Need for Acute Rehabilitation Level of Care: The patient requires daily medical management by a physician with specialized training in physical medicine and rehabilitation for the following conditions: Daily direction of a multidisciplinary physical rehabilitation program to ensure safe treatment while eliciting the highest outcome that is of practical value to the patient.: Yes Daily medical management of patient stability for increased activity during participation in an intensive rehabilitation  regime.: Yes Daily analysis of laboratory values and/or radiology reports with any subsequent need for medication adjustment of medical intervention for : Post surgical problems;Neurological problems  Kamariya Blevens 12/05/2014, 2:20 PM

## 2014-12-06 ENCOUNTER — Inpatient Hospital Stay (HOSPITAL_COMMUNITY): Payer: Federal, State, Local not specified - PPO | Admitting: Occupational Therapy

## 2014-12-06 ENCOUNTER — Inpatient Hospital Stay (HOSPITAL_COMMUNITY): Payer: Federal, State, Local not specified - PPO | Admitting: Physical Therapy

## 2014-12-06 MED ORDER — MUSCLE RUB 10-15 % EX CREA
TOPICAL_CREAM | Freq: Three times a day (TID) | CUTANEOUS | Status: DC
  Administered 2014-12-06: 1 via TOPICAL
  Administered 2014-12-06: 10:00:00 via TOPICAL
  Filled 2014-12-06: qty 85

## 2014-12-06 NOTE — Progress Notes (Signed)
Social Work Patient ID: [name], male   DOB: September 05, 1950, 64 y.o.   MRN: [id number]   Met with pt and wife this morning to review team conference info.  Both report "some" improvements this week but pt states, "I wish there was more, though."  Both understand we continue to plan toward d/c on 9/13 and w/c seating eval to be done next week.  Discussed probable DME and follow up needs.  Both remain very passive and deny any concerns about d/c except one question of whether they should purchase a blood pressure cuff.  Will continue to follow for support and d/c planning needs.  Samarrah Tranchina, LCSW

## 2014-12-06 NOTE — Plan of Care (Signed)
Problem: RH Bathing Goal: LTG Patient will bathe with assist, cues/equipment (OT) LTG: Patient will bathe specified number of body parts with assist with/without cues using equipment (position) (OT)  Outcome: Not Applicable Date Met:  96/72/27 Goal d/c as pt reports wife will provided total A for bathing at bed level. Will provide education to caregiver and focus on pt directing caregiver.- AL

## 2014-12-06 NOTE — Progress Notes (Signed)
Occupational Therapy Session Note  Patient Details  Name: Carl [name] MRN: [id number] Date of Birth: Jul 24, 1950  Today's Date: 12/06/2014 OT Individual Time: 0830-0930 and 1300-1400 OT Individual Time Calculation (min): 60 min and 60 min   Short Term Goals: Week 3:  OT Short Term Goal 1 (Week 3): Pt will thread B LEs into pants with set-up assist in circle sit position OT Short Term Goal 2 (Week 3): Pt will complete functional transfers with min A. OT Short Term Goal 3 (Week 3): Pt will direct caregiver with sliding board transfer with no VCs. OT Short Term Goal 4 (Week 3): Pt will bathe LB with min A following set-up of circle sit position.   Skilled Therapeutic Interventions/Progress Updates:    Session One: Pt seen for OT ADL bathing and dressing session. Pt in supine upon arrival with wife present. Total A for LB bathing and dressing for time purposes from bed level. He sat EOB with TED hose, ACE wraps, and abdominal binder with BP 78/46, following LE and UE exercises/ movement and transfer to [facility] BP 90/63. Abdominal binder removed as focus on pt tolerating use of only TED hose and ACE wraps, however, pt's BP 85/50. Abdominal binder reapplied in loose fitting. He completed transfer BSC> w/c with min physical assistance and +2 available to steady equipment. Pt left sitting in w/c at end of session, all needs in reach.    Conversation took place with pt and wife regarding d/c planning and OT goals. Pt voiced that self-bathing was no longer a goal of his and he would rely on wife for assistance with bathing. OT goals modified, see POC for updated goals.   Session Two: Pt seen for OT session focusing on functional transfers and ADL re-training. Pt in supine upon arrival, agreeable to tx. See below for required assist level with transfers. With significantly more time, pt able to bring B LEs off EOB, assist required to bring trunk into upright sitting position. Upon sitting EOB, pt's BP 75/42. Pt  complete UE and LE exercises seated EOP and BP raised to 98/66. Pt sat on BSC and practiced lateral leans from BSC onto EOB on R side and into w/c seat on L side. Focus on pt obtaining functional lateral lean to clear leg in order for caregiver to assist with clothing management. Pt returned to w/c at end of session, all needs in reach.   Therapy Documentation Precautions:  Precautions Precautions: Cervical, Fall Precaution Comments: Using BLE ACE wraps for BP control at this time. Monitor this Required Braces or Orthoses: Cervical Brace Cervical Brace: Hard collar, At all times Restrictions Weight Bearing Restrictions: No Pain: Pain Assessment Pain Assessment: 0-10 Pain Score: 5  Pain Type: Acute pain;Neuropathic pain Pain Location: Arm Pain Orientation: Left Pain Descriptors / Indicators: Aching;Sore Pain Onset: On-going Pain Intervention(s): Rest Multiple Pain Sites: No  Function:                                                   Bathing Bathing position      Bathing parts   Body parts bathed by helper: Back;Left lower leg;Right upper leg;Left upper leg;Right lower leg  Bathing assist         Upper Body Dressing/Undressing Upper body dressing   What is the patient wearing?: Pull over shirt/dress     Pull over  shirt/dress - Perfomed by patient: Thread/unthread right sleeve;Thread/unthread left sleeve;Put head through opening Pull over shirt/dress - Perfomed by helper: Pull shirt over trunk        Upper body assist         Lower Body Dressing/Undressing Lower body dressing   What is the patient wearing?: Ted Hose;Pants       Pants- Performed by helper: Thread/unthread right pants leg;Thread/unthread left pants leg;Pull pants up/down   Non-skid slipper socks- Performed by helper: Don/doff right sock;Don/doff left sock               TED Hose - Performed by helper: Don/doff right TED hose;Don/doff left TED hose  Lower body assist          Bed  Mobility Roll left and right activity   Assist level: Mod assist (Pt 50 - 74%, lift 2 legs) Roll left and right assistive device: Bedrails  Sit to lying activity   Assist level: Mod assist (Pt 50 - 74%, lift 2 legs) Sit to lying assistive device: Bedrails  Lying to sitting activity   Assist level: Maximal assist (Pt 25 - 49%) Lying to sitting assistive device: Bedrails  Mobility details Bed mobility details: Verbal cues for techniques;Verbal cues for safe use of DME/AE;Verbal cues for sequencing;Verbal cues for precautions/safety;Manual facilitation for placement;Manual facilitation for weight shifting;Manual facilitation for weight bearing    Transfers Sit to stand transfer        Chair/bed transfer   Chair/bed transfer method: Lateral scoot Chair/bed transfer assist level: Touching or steadying assistance (Pt > 75%) Chair/bed transfer assistive device: Bedrails;Sliding board;Orthosis;Armrests (leg loops)   Chair/bed transfer details: Verbal cues for technique;Verbal cues for sequencing;Manual facilitation for weight shifting;Manual facilitation for placement  Toilet transfer   Toilet transfer assistive device: Sliding board       Assist level to toilet: 2 helpers Assist level from toilet: 2 helpers  Haematologist Comprehension Comprehension assist level: Follows basic conversation/direction with extra time/assistive device  Expression Expression assist level: Expresses complex ideas: With extra time/assistive device  Social Interaction Social Interaction assist level: Interacts appropriately 90% of the time - Needs monitoring or encouragement for participation or interaction.  Problem Solving Problem solving assist level: Solves complex 90% of the time/cues < 10% of the time  Memory Memory assist level: Complete Independence: No helper    Therapy/Group: Individual Therapy  Lewis, Sidi Dzikowski C 12/06/2014, 12:24 PM

## 2014-12-06 NOTE — Progress Notes (Signed)
Physical Therapy Session Note  Patient Details  Name: Carl [name] MRN: [id number] Date of Birth: 1951-04-02  Today's Date: 12/06/2014 PT Individual Time: 1030-1130 Treatment Session 2: 1400-1440 PT Individual Time Calculation (min): 60 min Treatment Session 2: 40 min  Short Term Goals: Week 3:  PT Short Term Goal 1 (Week 3): Pt will perform bed mobility with use of leg loops and mod A PT Short Term Goal 2 (Week 3): Pt will perform bed <> w/c transfers with consistent mod A (on even surfaces) PT Short Term Goal 3 (Week 3): Pt will perform w/c mobility x 100' with min A in controlled environment PT Short Term Goal 4 (Week 3): Pt will tolerate dynamic standing balance activities and pre-gait activities with max A  PT Short Term Goal 5 (Week 3): Pt will maintain sitting balance during dynamic activities with min A  Skilled Therapeutic Interventions/Progress Updates:    Treatment Session 1: Pt receivd up in TIS w/c with loose abdominal binder, c/o lightheadedness. Vitals taken - see below for details. Therapeutic Activity - Pt transfers back to bed, abdominal binder tightened, then transferred back to manual w/c - see below for details. AFter w/c propulsion - pt transferred back to bed due to fatigue with wife present and all needs in reach. W/C Management - Pt initially only able to self propel 50' x 2 reps with up to min A due to poor hand contact/grip with pushrims/wheels. PT adds theraband to pushrims and pt has improved w/c propulsion - see below for details. PT also adds theraband sling to legrest due to screw being stripped and footplate dragging on the ground. PT educates wife & pt re: pros/cons of obtaining a TIS w/c vs an ultralightweight K5 manual w/c, to give pt and wife time to ponder this information prior to w/c evaluation.   Treatment Session 2: Pt received up in manual w/c in gym with quick release belt in place. Pt agreeable to transfer to mat and work on LE motor recovery  exercises. W/C Management - PT notes that L w/c brake is not locking. PT obtains Customer service manager and moves bracket for L w/c brake, making the lock secure. Pt demonstrates ability to lock/un-lock L w/c brake with arm with increased effort, indicating satisfaction with improved lock. Once w/c brake adjustment complete - pt reports he is having a BM. Therapeutic Activity - PT rolls pt back to room, instructs pt in scoot transfer with slideboard req mod A due to pt upset about BM and overall fatigue level - req max cues for head-hips relationship. Pt req mod A for B LEs sit to side lie. PT notes pt incontinent of BM and accident, soiling outer w/c cushion cover, pants, and bed linen. RN notified who assists PT in doffing pants, ace wraps, TED hose, abdominal binder, assists in positioning pt in side lie with towels so pt can completely evacuate. Pt very upset about bowel accident and inconsolable. PT educates pt about neuropsych resource to assist in coping with this life change and pt refuses at this time. 20 minutes missed due to pt toileting. Continue per PT POC.    Therapy Documentation Precautions:  Precautions Precautions: Cervical, Fall Precaution Comments: Using BLE ACE wraps for BP control at this time. Monitor this Required Braces or Orthoses: Cervical Brace Cervical Brace: Hard collar, At all times Restrictions Weight Bearing Restrictions: No General: PT Amount of Missed Time (min): 20 Minutes PT Missed Treatment Reason: Toileting Vital Signs: Therapy Vitals Pulse Rate: (!) 38 BP: Marland Kitchen)  91/53 mmHg Patient Position (if appropriate): Sitting Oxygen Therapy SpO2: 97 % O2 Device: Not Delivered Pain: Pain Assessment Pain Assessment: 0-10 Pain Score: 5  Pain Type: Acute pain;Neuropathic pain Pain Location: Arm Pain Orientation: Left Pain Descriptors / Indicators: Aching;Sore Pain Onset: On-going Pain Intervention(s): Rest Multiple Pain Sites: No Treatment Session 2: Pt denies pain.     Function:  Financial trader activity did not occur: No continent bowel/bladder event           Bed Mobility Roll left and right activity   Assist level: Touching or steadying assistance (Pt > 75%, lift 1 leg) Roll left and right assistive device: Bedrails  Sit to lying activity   Assist level: Mod assist (Pt 50 - 74%, lift 2 legs) Sit to lying assistive device: Bedrails  Lying to sitting activity   Assist level: Maximal assist (Pt 25 - 49%) Lying to sitting assistive device: Bedrails  Mobility details Bed mobility details: Verbal cues for techniques;Verbal cues for safe use of DME/AE;Verbal cues for sequencing;Verbal cues for precautions/safety;Manual facilitation for placement;Manual facilitation for weight shifting;Manual facilitation for weight bearing   Transfers Sit to stand transfer        Chair/bed transfer   Chair/bed transfer method: Lateral scoot Chair/bed transfer assist level: Touching or steadying assistance (Pt > 75%) Chair/bed transfer assistive device: Bedrails;Sliding board;Orthosis;Armrests (leg loops)   Chair/bed transfer details: Verbal cues for technique;Verbal cues for sequencing;Manual facilitation for weight shifting;Manual facilitation for placement     Car transfer          Locomotion Ambulation          Walk 10 feet activity      Walk 50 feet with 2 turns activity      Walk 150 feet activity      Walk 10 feet on uneven surfaces activity      Stairs          Walk up/down 1 step activity        Walk up/down 4 steps activity      Walk up/down 12 steps activity      Pick up small objects from floor      Wheelchair   Type: Manual Max wheelchair distance: 150 (therabands around push rims in ultra lightweight manual w/c) Assist Level: Touching or steadying assistance (Pt > 75%)  Wheel 50 feet with 2 turns activity   Assist Level: Touching or steadying assistance (Pt > 75%)  Wheel 150 feet activity   Assist  Level: Touching or steadying assistance (Pt > 75%)   Cognition Comprehension Comprehension assist level: Follows basic conversation/direction with extra time/assistive device  Expression Expression assist level: Expresses complex ideas: With extra time/assistive device  Social Interaction Social Interaction assist level: Interacts appropriately 90% of the time - Needs monitoring or encouragement for participation or interaction.  Problem Solving Problem solving assist level: Solves complex 90% of the time/cues < 10% of the time  Memory Memory assist level: Complete Independence: No helper    Therapy/Group: Individual Therapy with PT Tech Tracey as +2  Armenta Erskin M 12/06/2014, 11:50 AM

## 2014-12-06 NOTE — Progress Notes (Addendum)
[place] PHYSICAL MEDICINE & REHABILITATION     PROGRESS NOTE    Subjective/Complaints: Had positive results with fleet enema this am!!  Still feeling tired in pm  ROS: Pt denies  , rash/itching, headache, blurred or double vision, nausea, vomiting, abdominal pain, diarrhea, chest pain, shortness of breath, palpitations, dysuria, dizziness,  back pain, bleeding, anxiety, or depression    Objective: Vital Signs: Blood pressure 96/56, pulse 47, temperature 98.4 F (36.9 C), temperature source Oral, resp. rate 18, height  (1.727 m), weight 77.61 kg (171 lb 1.6 oz), SpO2 97 %. No results found.  Recent Labs  12/04/14 0425  WBC 7.0  HGB 12.2*  HCT 37.1*  PLT 300    Recent Labs  12/04/14 0425  NA 132*  K 4.3  CL 97*  GLUCOSE 106*  BUN 22*  CREATININE 0.97  CALCIUM 9.0   CBG (last 3)  No results for input(s): GLUCAP in the last 72 hours.  Wt Readings from Last 3 Encounters:  11/29/14 77.61 kg (171 lb 1.6 oz)  11/16/14 90.719 kg (200 lb)    Physical Exam:  Constitutional: He is oriented to person, place, and time. He appears well-developed and well-nourished.  HENT:  Head: Normocephalic and atraumatic.    Eyes: Conjunctivae are normal. Pupils are equal, round, and reactive to light.  Neck:  C Collar.  Cardiovascular: bradycardia and regular rhythm. Respiratory: Effort normal. No respiratory distress. He has no wheezes.  GI: Soft. Bowel sounds +.   Genitourinary: foley out Musculoskeletal: He exhibits tenderness along extensor compartment of left arm. He exhibits no edema.  Neurological: He is alert and oriented to person, place, and time. No cranial nerve deficit. Coordination normal.  Patient has normal sensation proximal to C6 dermatome. Patient has reduced sensation to pinprick but is able to identify the area touched at bilateral C6 through sacral levels Patient has decreased tone in bilateral Lower extremities 4/5 bilateral deltoids, biceps, 4-  triceps, 4 wrist extensors, trace right HI, 1+ to 2- left HI, right HF, KE and ADF/APF are 1 to 2-/5. Tr 1/5 left hip flexors and knee extensors ankle dorsiflexors and plantar flexors  Skin: Skin is warm and dry.  Cervical incision intact   Assessment/Plan: 1. 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 Physiatrist is providing close team supervision and 24 hour management of active medical problems listed below. Physiatrist and rehab team continue to assess barriers to discharge/monitor patient progress toward functional and medical goals.  FIM: Function - Bathing Position: Bed Body parts bathed by patient: Right upper leg, Left upper leg, Right arm, Left arm, Chest, Abdomen, Right lower leg Body parts bathed by helper: Back, Left lower leg Assist Level: Assistive device Assistive Device Comment: Hospital bed functions; declined buttock hygiene, RN performed cathing during session and performed perineal hygiene.   Function- Upper Body Dressing/Undressing What is the patient wearing?: Pull over shirt/dress Pull over shirt/dress - Perfomed by patient: Thread/unthread right sleeve, Thread/unthread left sleeve Pull over shirt/dress - Perfomed by helper: Put head through opening, Pull shirt over trunk Assist Level: 2 helpers Set up : To obtain clothing/put away Function - Lower Body Dressing/Undressing What is the patient wearing?: Ted Hose, Pants Position: Bed Pants- Performed by helper: Thread/unthread right pants leg, Thread/unthread left pants leg, Pull pants up/down Non-skid slipper socks- Performed by helper: Don/doff right sock, Don/doff left sock Socks - Performed by patient: Don/doff right  sock, Don/doff left sock Socks - Performed by helper: Don/doff right sock, Don/doff left sock Shoes - Performed by patient: Don/doff right shoe, Don/doff left shoe Shoes - Performed by  helper: Don/doff right shoe, Don/doff left shoe, Fasten right, Fasten left TED Hose - Performed by helper: Don/doff right TED hose, Don/doff left TED hose Assist Level: Touching or steadying assistance (Pt > 75%)  Function - Toileting Toileting activity did not occur: No continent bowel/bladder event Toileting steps completed by helper: Adjust clothing prior to toileting, Performs perineal hygiene, Adjust clothing after toileting Assist level: Two helpers  Function - Archivist transfer activity did not occur: Safety/medical concerns Toilet transfer assistive device: Sliding board Assist level to toilet: 2 helpers Assist level from toilet: Maximal assist (Pt 25 - 49%/lift and lower)  Function - Chair/bed transfer Chair/bed transfer method: Lateral scoot Chair/bed transfer assist level: Moderate assist (Pt 50 - 74%/lift or lower) Chair/bed transfer assistive device: Sliding board, Orthosis Chair/bed transfer details: Tactile cues for initiation, Tactile cues for sequencing, Tactile cues for posture, Verbal cues for sequencing, Verbal cues for technique, Verbal cues for precautions/safety, Manual facilitation for weight shifting  Function - Locomotion: Wheelchair Will patient use wheelchair at discharge?: Yes Type: Manual Max wheelchair distance: 150 ft Assist Level: Supervision or verbal cues, Touching or steadying assistance (Pt > 75%) Assist Level: Touching or steadying assistance (Pt > 75%) Assist Level: Supervision or verbal cues, Touching or steadying assistance (Pt > 75%) Function - Locomotion: Ambulation Ambulation activity did not occur: Safety/medical concerns Walk 10 feet activity did not occur: Safety/medical concerns Walk 50 feet with 2 turns activity did not occur: Safety/medical concerns Walk 150 feet activity did not occur: Safety/medical concerns Walk 10 feet on uneven surfaces activity did not occur: Safety/medical concerns  Function -  Comprehension Comprehension: Auditory Comprehension assist level: Follows basic conversation/direction with extra time/assistive device  Function - Expression Expression: Verbal Expression assist level: Expresses complex ideas: With extra time/assistive device  Function - Social Interaction Social Interaction assist level: Interacts appropriately 90% of the time - Needs monitoring or encouragement for participation or interaction.  Function - Problem Solving Problem solving assist level: Solves complex 90% of the time/cues < 10% of the time  Function - Memory Memory assist level: Complete Independence: No helper Patient normally able to recall (first 3 days only): Location of own room, Staff names and faces, That he or she is in a hospital, Current season Medical Problem List and Plan: 1. 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 DVT Prophylaxis/Anticoagulation: Pharmaceutical: Lovenox to continue  -dopplers negative 3. Pain Management: Continue Lyrica BID for neuropathic pain.tramdol for mod and oxy for severe pain  -add sports cream for forearm 4. Mood: Team to provide ego support.    -continue to provide positive reinforcement 5. Neuropsych: This patient is capable of making decisions on his own behalf. 6. Skin/Wound Care: Routine pressure relief measures.  7. Fluids/Electrolytes/Nutrition: 100% meals. Encouraging fluids 8. ZOX:[id number] negatie. afebrile  -ucx + for klebsiella oxytoca-- septra completed   9. 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  -spoke with cardiology (Dr. Delton See) who recommended ECHO---I have reviewed ECHO and it is completely normal---he has not been symptomatic from bradycardia, so  we will continue manage conservatively  -encourage fluids  -have added  florinef to better sustain BP's---they looked better yesterday  -limit pain meds (as possible)  -continue prn antivert for vestibular component of dizziness, treatment limited by collar 10. Acutre renal failure---volume depletion--resolved   - continue to push po  - BUN fairly stable 11. Neurogenic bladder---I/O caths---no urge or sense of bladder fullness yet  -continue to keep volumes 300-500cc 12. Neurogenic bowel: senna pm and fleet enema am effective today  LOS (Days) 16 A FACE TO FACE EVALUATION WAS PERFORMED  Cindra Austad T 12/06/2014 8:03 AM

## 2014-12-07 ENCOUNTER — Inpatient Hospital Stay (HOSPITAL_COMMUNITY): Payer: Federal, State, Local not specified - PPO

## 2014-12-07 ENCOUNTER — Inpatient Hospital Stay (HOSPITAL_COMMUNITY): Payer: Federal, State, Local not specified - PPO | Admitting: Physical Therapy

## 2014-12-07 ENCOUNTER — Inpatient Hospital Stay (HOSPITAL_COMMUNITY): Payer: Federal, State, Local not specified - PPO | Admitting: Occupational Therapy

## 2014-12-07 NOTE — Plan of Care (Signed)
Goals modified based on pt's progress. See POC for updated goals. Johnsie Cancel, OTR/L

## 2014-12-07 NOTE — Progress Notes (Signed)
Physical Therapy Session Note  Patient Details  Name: Carl [name] MRN: [id number] Date of Birth: Oct 16, 1950  Today's Date: 12/07/2014 PT Individual Time: 1330-1430 PT Individual Time Calculation (min): 60 min   Short Term Goals: Week 3:  PT Short Term Goal 1 (Week 3): Pt will perform bed mobility with use of leg loops and mod A PT Short Term Goal 2 (Week 3): Pt will perform bed <> w/c transfers with consistent mod A (on even surfaces) PT Short Term Goal 3 (Week 3): Pt will perform w/c mobility x 100' with min A in controlled environment PT Short Term Goal 4 (Week 3): Pt will tolerate dynamic standing balance activities and pre-gait activities with max A  PT Short Term Goal 5 (Week 3): Pt will maintain sitting balance during dynamic activities with min A  Skilled Therapeutic Interventions/Progress Updates:    Therapeutic Activity - see below for mat mobility and slideboard transfer details - pt is able to verbalize head-hips relationship, but direction (R vs L) of head during transfer is incorrect. Pt req min-mod A x 2 to stand in // bars - PT in front blocking knees from buckling and assisting with knee extension while 2nd PT assists at hips for boost to stand x 2 reps. Neuromuscular Reeducation: PT instructs pt in B LE motor recovery activities req AAROM- mod resistance prn: knee to chest/heel slides & extension, supine hip abduction/adduction, bridges with PT facilitating anterior tibial translation, ankle pumps, abdominal activation in B hook lie using PNF diagonals, SAQ: x 10 reps each. Hamstring stretch x 60 seconds each.   Pt reported thoroughly enjoying today's workout. Pt did well standing in // bars and may be ready to attempt ambulating in // bars with +2 assist or continue standing with platform walker for upright tolerance. Continue per PT POC.   Therapy Documentation Precautions:  Precautions Precautions: Cervical, Fall Precaution Comments: Using BLE ACE wraps for BP control at  this time. Monitor this Required Braces or Orthoses: Cervical Brace Cervical Brace: Hard collar, At all times Restrictions Weight Bearing Restrictions: No Pain: Pain Assessment Pain Assessment: No/denies pain Pain Score: 1  Pain Location: Arm Pain Orientation: Left Pain Intervention(s): Medication (See eMAR)  Function:  Toileting Toileting             Bed Mobility Roll left and right activity   Assist level: Mod assist (Pt 50 - 74%, lift 2 legs) Roll left and right assistive device: Bedrails  Sit to lying activity   Assist level: Max assist (Pt 25 - 49%) (using leg loops on mat)    Lying to sitting activity   Assist level: Maximal assist (Pt 25 - 49%) Lying to sitting assistive device: Bedrails;HOB elevated  Mobility details     Transfers Sit to stand transfer   Sit to stand assist level: 2 helpers Sit to stand assistive device: Other (parallel bars)  Chair/bed transfer   Chair/bed transfer method: Lateral scoot Chair/bed transfer assist level: Moderate assist (Pt 50 - 74%/lift or lower) Chair/bed transfer assistive device: Armrests;Sliding board   Chair/bed transfer details: Manual facilitation for weight shifting;Manual facilitation for placement;Verbal cues for technique   Toilet transfer                Car transfer          Locomotion Ambulation          Walk 10 feet activity      Walk 50 feet with 2 turns activity      Walk  150 feet activity      Walk 10 feet on uneven surfaces activity      Stairs          Walk up/down 1 step activity        Walk up/down 4 steps activity      Walk up/down 12 steps activity      Pick up small objects from floor      Wheelchair   Type: Manual Max wheelchair distance: 150 Assist Level: Supervision or verbal cues  Wheel 50 feet with 2 turns activity   Assist Level: Supervision or verbal cues  Wheel 150 feet activity   Assist Level: Supervision or verbal cues   Cognition Comprehension  Comprehension assist level: Understands complex 90% of the time/cues 10% of the time  Expression Expression assist level: Expresses basic needs/ideas: With no assist  Social Interaction Social Interaction assist level: Interacts appropriately 90% of the time - Needs monitoring or encouragement for participation or interaction.  Problem Solving Problem solving assist level: Solves basic 90% of the time/requires cueing < 10% of the time  Memory Memory assist level: Complete Independence: No helper    Therapy/Group: Individual Therapy  Carl [name] 12/07/2014, 2:13 PM

## 2014-12-07 NOTE — Progress Notes (Signed)
Occupational Therapy Session Note  Patient Details  Name: Carl [name] MRN: [id number] Date of Birth: 1950-07-02  Today's Date: 12/07/2014 OT Individual Time: 4696-2952 OT Individual Time Calculation (min): 60 min    Short Term Goals: Week 3:  OT Short Term Goal 1 (Week 3): Pt will thread B LEs into pants with set-up assist in circle sit position OT Short Term Goal 2 (Week 3): Pt will complete functional transfers with min A. OT Short Term Goal 3 (Week 3): Pt will direct caregiver with sliding board transfer with no VCs. OT Short Term Goal 4 (Week 3): Pt will bathe LB with min A following set-up of circle sit position.   Skilled Therapeutic Interventions/Progress Updates:    Pt seen for OT ADL therapy session. Pt in supine upon arrival, agreeable to tx session. Pt performed modified bathing task in supine,  Declining full bathing. [name] present for session, focus of session on pt directing care and wife providing assist. Questioning cues required for pt to direct care with log rolling technique to complete LB dressing. Following cues, pt's wife able to successfully assist pt with rolling. Educated pt's wife regarding use of abdominal binder, TED hose, and ACE wraps for BP regulation and importance of proper body mechanics and using hospital bed functions when assisting pt. Pt able to bring R LE off bed mod I with significantly increased time and min A to bring L LE off bed. With [facility] elevated, pt required assist to bring trunk up. Mod A for sliding board transfer into w/c. Pt left in tilt-in-space w/c at end of session, all needs in reach.   Education provided throughout session regarding d/c planning, and family training.    Therapy Documentation Precautions:  Precautions Precautions: Cervical, Fall Precaution Comments: Using BLE ACE wraps for BP control at this time. Monitor this Required Braces or Orthoses: Cervical Brace Cervical Brace: Hard collar, At all times Restrictions Weight  Bearing Restrictions: No Pain: Pain Assessment Pain Score: 1  Pain Location: Arm Pain Orientation: Left Pain Intervention(s): Medication (See eMAR)  Function:                              Bathing Bathing position   Position: Bed  Bathing parts Body parts bathed by patient: Right arm;Left arm;Chest;Abdomen    Bathing assist         Upper Body Dressing/Undressing Upper body dressing   What is the patient wearing?: Pull over shirt/dress     Pull over shirt/dress - Perfomed by patient: Thread/unthread right sleeve;Thread/unthread left sleeve;Put head through opening Pull over shirt/dress - Perfomed by helper: Pull shirt over trunk        Upper body assist         Lower Body Dressing/Undressing Lower body dressing   What is the patient wearing?: Ted Hose;Pants;Socks       Pants- Performed by helper: Thread/unthread right pants leg;Thread/unthread left pants leg;Pull pants up/down   Non-skid slipper socks- Performed by helper: Don/doff right sock;Don/doff left sock               TED Hose - Performed by helper: Don/doff right TED hose;Don/doff left TED hose  Lower body assist          Bed Mobility Roll left and right activity   Assist level: Mod assist (Pt 50 - 74%, lift 2 legs) Roll left and right assistive device: Bedrails  Sit to lying activity  Lying to sitting activity   Assist level: Moderate assist (Pt 50 - 74%, lift 2 legs) Lying to sitting assistive device: Bedrails;HOB elevated  Mobility details      Transfers Sit to stand transfer        Chair/bed transfer   Chair/bed transfer method: Lateral scoot Chair/bed transfer assist level: Moderate assist (Pt 50 - 74%/lift or lower) Chair/bed transfer assistive device: Bedrails;Sliding board;Orthosis;Armrests      Toilet transfer                Tub/shower transfer               Therapy/Group: Individual Therapy  Lewis, German Manke C 12/07/2014, 12:00 PM

## 2014-12-07 NOTE — Progress Notes (Signed)
Physical Therapy Session Note  Patient Details  Name: Carl [name] MRN: [id number] Date of Birth: 08/25/1950  Today's Date: 12/07/2014 PT Individual Time: 1000-1100 PT Individual Time Calculation (min): 60 min   Short Term Goals: Week 3:  PT Short Term Goal 1 (Week 3): Pt will perform bed mobility with use of leg loops and mod A PT Short Term Goal 2 (Week 3): Pt will perform bed <> w/c transfers with consistent mod A (on even surfaces) PT Short Term Goal 3 (Week 3): Pt will perform w/c mobility x 100' with min A in controlled environment PT Short Term Goal 4 (Week 3): Pt will tolerate dynamic standing balance activities and pre-gait activities with max A  PT Short Term Goal 5 (Week 3): Pt will maintain sitting balance during dynamic activities with min A  Skilled Therapeutic Interventions/Progress Updates:  Wife present in room but stated she had to go to an appt and could not stay for session for training.  Pt used leg loops to assist PT in removing bil LEs off tilt in space w/c foot rests in preparation for stander.   Standing tolerance: in stander, in partial standing with 30 degrees of knee flexion, pt tolerated 1 min 20 sec, 40 seconds, limited by dizziness.  BP in sitting= 90/65, HR 40. Partial standing with 60 degrees knee flexion, pt tolerated 3 min 7 seconds, limited by dizziness.  BP 58/36, HR 46.  Pt reported that he had just had his BP meds before session started.  Pt tilted back in w/c for hypotension management until he reported dizziness had passed.  Briefly pt raised again into stander so that he could be lowered into standard manual w/c .  W/c propulsion in standard manual w/c including weaving around cones approx 6' apart requiring turns, with supervision.  Pt encountered 3/4 cones with w/c tires.  Pt transferred via stander back into tilt- in- space w/c to rest, tilted back 20 degrees.  PT returned pt to room.  Quick release belt applied and pancake call bell placed in lap.      Therapy Documentation Precautions:  Precautions Precautions: Cervical, Fall Precaution Comments: Using BLE ACE wraps for BP control at this time. Monitor this Required Braces or Orthoses: Cervical Brace Cervical Brace: Hard collar, At all times Restrictions Weight Bearing Restrictions: No Pain: Pain Assessment Pain Assessment: No/denies pain Pain Score: 0-No pain         Function:  Toileting Toileting             Bed Mobility Roll left and right activity        Sit to lying activity       Lying to sitting activity   A   Mobility details    Transfers Sit to stand transfer      Chair/bed transfer        Personnel officer transfer          Locomotion Ambulation          Walk 10 feet activity      Walk 50 feet with 2 turns activity      Walk 150 feet activity      Walk 10 feet on uneven surfaces activity      Stairs          Walk up/down 1 step activity        Walk up/down 4 steps activity  Walk up/down 12 steps activity      Pick up small objects from floor      Wheelchair   Type: Manual Max wheelchair distance: 150 Assist Level: Supervision or verbal cues  Wheel 50 feet with 2 turns activity   Assist Level: Supervision or verbal cues  Wheel 150 feet activity   Assist Level: Supervision or verbal cues   Cognition Comprehension Comprehension assist level: Understands complex 90% of the time/cues 10% of the time  Expression Expression assist level: Expresses basic needs/ideas: With no assist  Social Interaction Social Interaction assist level: Interacts appropriately 90% of the time - Needs monitoring or encouragement for participation or interaction.  Problem Solving Problem solving assist level: Solves basic 90% of the time/requires cueing < 10% of the time  Memory Memory assist level: Complete Independence: No helper    Therapy/Group: Individual Therapy  Talibah Colasurdo 12/07/2014, 5:26 PM

## 2014-12-07 NOTE — Progress Notes (Signed)
Occupational Therapy Session Note  Patient Details  Name: Nesbit Michon MRN: [id number] Date of Birth: [birth year]  Today's Date: 12/07/2014 OT Individual Time: 1430-1500 OT Individual Time Calculation (min): 30 min     Skilled Therapeutic Interventions/Progress Updates:    Pt worked on Hydrologist transfers from wheelchair to bed with total assist +2 (pt 40%) using sliding board.  Pt able to tell therapist steps to sequence transfer as well wheelchair setup.  Pt utilized leg loops to assist with positioning the LEs off of the foot rests.  Once sitting EOB worked on maintaining sitting balance, which pt is able to perform statically with min guard assist and BUE support.  When sitting without UE support she needs min guard assist but demonstrates a severe posterior pelvic tilt and increased lumbar/thoracic flexion.  Worked on anterior pelvic tilt and increased lumbar extension exercises in sitting.  Mod facilitation at trunk to achieve less than neutral tilt and maintain for 5-6 second intervals.  Transitioned to supine from sitting at end of session.  Pt left with call button within reach as well as nursing present to administer meds.   Therapy Documentation Precautions:  Precautions Precautions: Cervical, Fall Precaution Comments: Using BLE ACE wraps for BP control at this time. Monitor this Required Braces or Orthoses: Cervical Brace Cervical Brace: Hard collar, At all times Restrictions Weight Bearing Restrictions: No  Pain: Pain Assessment Pain Assessment: No/denies pain Pain Score: 0-No pain ADL:  Function:                                                                                                  Lower body assist         Toileting Toileting          Toileting assist      Bed Mobility Roll left and right activity        Sit to lying activity   Assist level: Max assist (Pt 25 - 49%)    Lying to sitting activity   Assist level:  Maximal assist (Pt 25 - 49%)    Mobility details Bed mobility details: Manual facilitation for placement;Manual facilitation for weight bearing;Verbal cues for sequencing    Transfers Sit to stand transfer      Chair/bed transfer   Chair/bed transfer method: Lateral scoot Chair/bed transfer assist level: Moderate assist (Pt 50 - 74%/lift or lower) Chair/bed transfer assistive device: Armrests;Sliding board   Chair/bed transfer details: Manual facilitation for weight shifting;Manual facilitation for placement;Verbal cues for technique  Toilet transfer                Tub/shower transfer             Cognition Comprehension Comprehension assist level: Understands complex 90% of the time/cues 10% of the time  Expression Expression assist level: Expresses basic needs/ideas: With no assist  Social Interaction Social Interaction assist level: Interacts appropriately 90% of the time - Needs monitoring or encouragement for participation or interaction.  Problem Solving Problem solving assist level: Solves basic 90% of the time/requires cueing < 10% of the time  Memory Memory assist  level: Complete Independence: No helper    Therapy/Group: Individual Therapy  Gabriell Daigneault OTR/L 12/07/2014, 4:15 PM

## 2014-12-07 NOTE — Progress Notes (Signed)
[place] PHYSICAL MEDICINE & REHABILITATION     PROGRESS NOTE    Subjective/Complaints: In good spirits today. Pain better. Still some fatigue yesterday. Had one "blowout" while in therapy in the afternoon  ROS: Pt denies  , rash/itching, headache, blurred or double vision, nausea, vomiting, abdominal pain, diarrhea, chest pain, shortness of breath, palpitations, dysuria, dizziness,  back pain, bleeding, anxiety, or depression    Objective: Vital Signs: Blood pressure 95/50, pulse 46, temperature 98.5 F (36.9 C), temperature source Oral, resp. rate 17, height 5\' 8"  (1.727 m), weight 74.844 kg (165 lb), SpO2 96 %. No results found. No results for input(s): WBC, HGB, HCT, PLT in the last 72 hours. No results for input(s): NA, K, CL, GLUCOSE, BUN, CREATININE, CALCIUM in the last 72 hours.  Invalid input(s): CO CBG (last 3)  No results for input(s): GLUCAP in the last 72 hours.  Wt Readings from Last 3 Encounters:  12/06/14 74.844 kg (165 lb)  11/16/14 90.719 kg (200 lb)    Physical Exam:  Constitutional: He is oriented to person, place, and time. He appears well-developed and well-nourished.  HENT:  Head: Normocephalic and atraumatic.    Eyes: Conjunctivae are normal. Pupils are equal, round, and reactive to light.  Neck:  C Collar.  Cardiovascular: bradycardia and regular rhythm. Respiratory: Effort normal. No respiratory distress. He has no wheezes.  GI: Soft. Bowel sounds +.   Genitourinary: foley out Musculoskeletal: He exhibits tenderness along extensor compartment of left arm. He exhibits no edema.  Neurological: He is alert and oriented to person, place, and time. No cranial nerve deficit. Coordination normal.  Patient has normal sensation proximal to C6 dermatome. Patient has reduced sensation to pinprick but is able to identify the area touched at bilateral C6 through sacral levels Patient has decreased tone in bilateral Lower extremities 4/5 bilateral  deltoids, biceps, 4- triceps, 4 wrist extensors, trace right HI, 1+ to 2- left HI, right HF, KE and ADF/APF are 1 to 2-/5. Tr 1/5 left hip flexors and knee extensors ankle dorsiflexors and plantar flexors  Skin: Skin is warm and dry.  Cervical incision intact   Assessment/Plan: 1. 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 Physiatrist is providing close team supervision and 24 hour management of active medical problems listed below. Physiatrist and rehab team continue to assess barriers to discharge/monitor patient progress toward functional and medical goals.  FIM: Function - Bathing Position: Bed Body parts bathed by patient: Right upper leg, Left upper leg, Right arm, Left arm, Chest, Abdomen, Right lower leg Body parts bathed by helper: Back, Left lower leg, Right upper leg, Left upper leg, Right lower leg Assist Level: Assistive device Assistive Device Comment: Hospital bed functions; declined buttock hygiene, RN performed cathing during session and performed perineal hygiene.   Function- Upper Body Dressing/Undressing What is the patient wearing?: Pull over shirt/dress Pull over shirt/dress - Perfomed by patient: Thread/unthread right sleeve, Thread/unthread left sleeve, Put head through opening Pull over shirt/dress - Perfomed by helper: Pull shirt over trunk Assist Level: 2 helpers Set up : To obtain clothing/put away Function - Lower Body Dressing/Undressing What is the patient wearing?: Ted Hose, Pants Position: Bed Pants- Performed by helper: Thread/unthread right pants leg, Thread/unthread left pants leg, Pull pants up/down Non-skid slipper socks- Performed by helper: Don/doff right sock, Don/doff left sock Socks - Performed by patient: Don/doff right sock, Don/doff left sock Socks -  Performed by helper: Don/doff right sock, Don/doff left sock Shoes - Performed by  patient: Don/doff right shoe, Don/doff left shoe Shoes - Performed by helper: Don/doff right shoe, Don/doff left shoe, Fasten right, Fasten left TED Hose - Performed by helper: Don/doff right TED hose, Don/doff left TED hose Assist Level: Touching or steadying assistance (Pt > 75%)  Function - Toileting Toileting activity did not occur: No continent bowel/bladder event Toileting steps completed by helper: Adjust clothing prior to toileting, Performs perineal hygiene, Adjust clothing after toileting Assist level: Two helpers  Function - Archivist transfer activity did not occur: Safety/medical concerns Toilet transfer assistive device: Sliding board Assist level to toilet: 2 helpers Assist level from toilet: 2 helpers  Function - Chair/bed transfer Chair/bed transfer method: Lateral scoot Chair/bed transfer assist level: Moderate assist (Pt 50 - 74%/lift or lower) Chair/bed transfer assistive device: Bedrails, Sliding board, Orthosis, Armrests Chair/bed transfer details: Verbal cues for technique, Verbal cues for sequencing, Manual facilitation for weight shifting, Manual facilitation for placement  Function - Locomotion: Wheelchair Will patient use wheelchair at discharge?: Yes Type: Manual Max wheelchair distance: 150 (therabands around push rims in ultra lightweight manual w/c) Assist Level: Touching or steadying assistance (Pt > 75%) Assist Level: Touching or steadying assistance (Pt > 75%) Assist Level: Touching or steadying assistance (Pt > 75%) Function - Locomotion: Ambulation Ambulation activity did not occur: Safety/medical concerns Walk 10 feet activity did not occur: Safety/medical concerns Walk 50 feet with 2 turns activity did not occur: Safety/medical concerns Walk 150 feet activity did not occur: Safety/medical concerns Walk 10 feet on uneven surfaces activity did not occur: Safety/medical concerns  Function - Comprehension Comprehension:  Auditory Comprehension assist level: Understands complex 90% of the time/cues 10% of the time  Function - Expression Expression: Verbal Expression assist level: Expresses basic needs/ideas: With no assist  Function - Social Interaction Social Interaction assist level: Interacts appropriately 90% of the time - Needs monitoring or encouragement for participation or interaction.  Function - Problem Solving Problem solving assist level: Solves basic 90% of the time/requires cueing < 10% of the time  Function - Memory Memory assist level: Complete Independence: No helper Patient normally able to recall (first 3 days only): Location of own room, Staff names and faces, That he or she is in a hospital, Current season Medical Problem List and Plan: 1. 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 DVT Prophylaxis/Anticoagulation: Pharmaceutical: Lovenox to continue  -dopplers negative 3. Pain Management: Continue Lyrica BID for neuropathic pain.tramdol for mod and oxy for severe pain  -added sports cream for forearm 4. Mood: Team to provide ego support.    -continue to provide positive reinforcement 5. Neuropsych: This patient is capable of making decisions on his own behalf. 6. Skin/Wound Care: Routine pressure relief measures.  7. Fluids/Electrolytes/Nutrition: 100% meals. Encouraging fluids 8. ZOX:[id number] negatie. afebrile  -ucx + for klebsiella oxytoca-- septra completed   9. 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  -spoke with cardiology (Dr. Delton See) who recommended ECHO---I have reviewed ECHO and it is completely normal---he has not been symptomatic from bradycardia, so we will continue manage  conservatively  -encourage fluids  -continue florinef to better sustain BP's---they looked better yesterday  -limit pain meds (as possible)  -continue prn antivert for vestibular  component of dizziness, treatment limited by collar 10. Acutre renal failure---volume depletion--resolved   - continue to push po  - BUN fairly stable 11. Neurogenic bladder---I/O caths---no urge or sense of bladder fullness yet  -continue to keep volumes 300-500cc 12. Neurogenic bowel: senna in pm and fleet enema qam with improved results-   -had one incontinent episode during therapy yesterday---if recurrent today, then stop hs senna  LOS (Days) 17 A FACE TO FACE EVALUATION WAS PERFORMED  Adam Demary T 12/07/2014 8:51 AM

## 2014-12-08 ENCOUNTER — Inpatient Hospital Stay (HOSPITAL_COMMUNITY): Payer: Federal, State, Local not specified - PPO | Admitting: Occupational Therapy

## 2014-12-08 ENCOUNTER — Inpatient Hospital Stay (HOSPITAL_COMMUNITY): Payer: Federal, State, Local not specified - PPO | Admitting: Physical Therapy

## 2014-12-08 MED ORDER — SENNA 8.6 MG PO TABS
2.0000 | ORAL_TABLET | Freq: Every day | ORAL | Status: DC
  Administered 2014-12-08 – 2014-12-18 (×11): 17.2 mg via ORAL
  Filled 2014-12-08 (×11): qty 2

## 2014-12-08 NOTE — Progress Notes (Addendum)
Occupational Therapy Session Note  Patient Details  Name: Carl [name] MRN: [id number] Date of Birth: April 30, 1950  Today's Date: 12/08/2014 OT Individual Time: 1600-1700 1000-1100   (60 min)  1st session                                         1600-1700  (60 min)  2nd session OT Individual Time Calculation (min): 60 min    Short Term Goals: Week 1:  OT Short Term Goal 1 (Week 1): Pt will bathe in supine with mod A OT Short Term Goal 1 - Progress (Week 1): Not met OT Short Term Goal 2 (Week 1): Pt will direct care for bed mobility with min questioning cues OT Short Term Goal 2 - Progress (Week 1): Met OT Short Term Goal 3 (Week 1): Pt will self feed 50% of meal with min A OT Short Term Goal 3 - Progress (Week 1): Met Week 2:  OT Short Term Goal 1 (Week 2): Pt will thread B UEs into shirt in order to increase independence with ADLs OT Short Term Goal 1 - Progress (Week 2): Met OT Short Term Goal 2 (Week 2): Pt will place toothpaste on toothbrush with set-up to open container during grooming task to increase independence with ADLs OT Short Term Goal 2 - Progress (Week 2): Met OT Short Term Goal 3 (Week 2): Pt will manipulate water cup and bring to mouth independently in order to increase independence with ADLs.  OT Short Term Goal 3 - Progress (Week 2): Met  Skilled Therapeutic Interventions/Progress Updates:    1st session:  Pt in room with wife, Fraser Din present.  Performed pt/family education with sliding board transfers from wheelchair to bed .  OT educated on transfer from bed to wc and back; from wc to sofa.  Provided education on verbalizing step by step instructions so pt/caregiver on on the same page.  Instructed wife on body mechanics.  Educated pt on positioning of feet and head/hip ratio.  Pt utilized leg loops to assist with positioning the LEs off of the foot rests.  Pt left with call button within reach as well as nursing present to administer meds.   2nd session  Addressed sliding  board transfers to dropped arm BSC and to wc.  Pt taken to ortho gym.  Practiced transfer from wc to mat without sliding board with max assist and then to [facility].  Went over using slick pants for ease in donning and doffing or no pants.  Transferred to mat>wc>bed.  Performed rolling for BM check.  Wife present. Removed teds and ace wraps.  Left pt in bed with call bell,phone within reach.     Therapy Documentation Precautions:  Precautions Precautions: Cervical, Fall Precaution Comments: Using BLE ACE wraps for BP control at this time. Monitor this Required Braces or Orthoses: Cervical Brace Cervical Brace: Hard collar, At all times Restrictions Weight Bearing Restrictions: No      Pain:   3/10  Left and right arm with left more in forearm 1st and 2nd session Pain Assessment Pain Assessment: No/denies pain Pain Score: 0-No pain          Function:   Eating Eating   Eating Assist Level: Set up assist for Eating Assistive Device Comment: adaptive equipment Eating Set Up Assist For: Opening containers;Applying device (includes dentures)       Grooming Oral Care,Brush  Teeth, Clean Dentures Activity:             Wash, Rinse, Dry Face Activity          Wash, Rinse, Dry Hands Activity          Brush, Comb Hair Activity        Shave Activity          Apply Makeup Activity                                                             Bathing Bathing position      Bathing parts      Bathing assist         Upper Body Dressing/Undressing Upper body dressing                    Upper body assist         Lower Body Dressing/Undressing Lower body dressing                                  Lower body assist         Toileting Toileting          Toileting assist      Bed Mobility Roll left and right activity   Assist level: Mod assist (Pt 50 - 74%, lift 2 legs)    Sit to lying activity   Assist level: Max assist (Pt 25 -  49%)    Lying to sitting activity   Assist level: Moderate assist (Pt 50 - 74%, lift 2 legs) Lying to sitting assistive device: Bedrails  Mobility details Bed mobility details: Manual facilitation for weight bearing    Transfers Sit to stand transfer   Sit to stand assist level: 2 helpers Sit to stand assistive device: Other (parallel bars)  Chair/bed transfer   Chair/bed transfer method: Lateral scoot Chair/bed transfer assist level: Maximal assist (Pt 25 - 49%/lift and lower) Chair/bed transfer assistive device: Armrests   Chair/bed transfer details: Manual facilitation for placement;Verbal cues for technique  Toilet transfer   Toilet transfer assistive device: Drop arm commode       Assist level to toilet: Maximal assist (Pt 25 - 49%/lift and lower) Assist level from toilet: Maximal assist (Pt 25 - 49%/lift and lower)  Tub/shower transfer             Cognition Comprehension Comprehension assist level: Follows complex conversation/direction with no assist  Expression Expression assist level: Expresses complex ideas: With no assist  Social Interaction Social Interaction assist level: Interacts appropriately with others with medication or extra time (anti-anxiety, antidepressant).  Problem Solving Problem solving assist level: Solves complex problems: Recognizes & self-corrects  Memory Memory assist level: Complete Independence: No helper    Therapy/Group: Individual Therapy  Lisa Roca 12/08/2014, 5:26 PM

## 2014-12-08 NOTE — Progress Notes (Signed)
Physical Therapy Session Note  Patient Details  Name: Carl [name] MRN: [id number] Date of Birth: October 18, 1950  Today's Date: 12/08/2014 PT Individual Time: 1424-1521 PT Individual Time Calculation (min): 57 min   Short Term Goals: Week 2:  PT Short Term Goal 1 (Week 2): Pt will perform bed mobility rolling and supine <> sit with use of leg loops and max A of one person PT Short Term Goal 1 - Progress (Week 2): Met PT Short Term Goal 2 (Week 2): Pt will perform slideboard transfer bed <> chair on level surface with max A of one person and use of leg loops PT Short Term Goal 2 - Progress (Week 2): Met PT Short Term Goal 3 (Week 2): Pt will trial and perform w/c mobility in manual w/c x 50' with mod A  PT Short Term Goal 3 - Progress (Week 2): Met PT Short Term Goal 4 (Week 2): Pt will tolerate sitting up OOB x 4-6 hours and be independent with requesting pressure relief every 30 minutes PT Short Term Goal 4 - Progress (Week 2): Met PT Short Term Goal 5 (Week 2): Pt will tolerate standing in frame x 5 minutes for WB and core strengthening during dynamic UE activity PT Short Term Goal 5 - Progress (Week 2): Progressing toward goal  Skilled Therapeutic Interventions/Progress Updates:   Pt received in bed after prolonged rest break after am session.  Performed rolling and supine > sit EOB as below.  Focused on progression of transfers to squat pivot bed > w/c <> mat without board to allow pt increased activation/use of UE and LE extensors with therapist providing mod-max A at trunk for balance/postural control and weight shifting.  Performed multiple sit <> stands with UE support on // bars and +2 assistance for safety; focus during standing on weight shifting, single LE advancement forwards and back and strengthening of extensors with multiple squats.  Pt required frequent rest breaks due to UE fatigue; minimal c/o lightheadedness.  Continued dynamic sitting balance, trunk control, UE strength and  endurance training seated edge of mat with min-mod support while playing zoom ball against another patient for 30 seconds-1 minute at a time.  At end of session returned to room and left in tilted position with all items within reach to await second OT session.  Therapy Documentation Precautions:  Precautions Precautions: Cervical, Fall Precaution Comments: Using BLE ACE wraps for BP control at this time. Monitor this Required Braces or Orthoses: Cervical Brace Cervical Brace: Hard collar, At all times Restrictions Weight Bearing Restrictions: No Pain: Pain Assessment Pain Assessment: No/denies pain  Function:   Bed Mobility Roll left and right activity   Assist level: Touching or steadying assistance (Pt > 75%, lift 1 leg) Roll left and right assistive device: Bedrails;Other (comment) (leg loops)  Sit to lying activity        Lying to sitting activity   Assist level: Moderate assist (Pt 50 - 74%, lift 2 legs) Lying to sitting assistive device: Bedrails  Mobility details Bed mobility details: Manual facilitation for placement;Manual facilitation for weight bearing;Verbal cues for sequencing;Verbal cues for techniques;Manual facilitation for weight shifting   Transfers Sit to stand transfer   Sit to stand assist level: 2 helpers Sit to stand assistive device: Other (parallel bars)  Chair/bed transfer   Chair/bed transfer method: Squat pivot Chair/bed transfer assist level: Maximal assist (Pt 25 - 49%/lift and lower) Chair/bed transfer assistive device: Armrests   Chair/bed transfer details: Tactile cues for posture;Verbal cues for  sequencing;Verbal cues for technique;Manual facilitation for weight shifting;Manual facilitation for placement   Toilet transfer                Car transfer          Cognition Comprehension Comprehension assist level: Follows complex conversation/direction with no assist  Expression Expression assist level: Expresses complex ideas: With no  assist  Social Interaction Social Interaction assist level: Interacts appropriately with others with medication or extra time (anti-anxiety, antidepressant).  Problem Solving Problem solving assist level: Solves complex problems: Recognizes & self-corrects  Memory Memory assist level: Complete Independence: No helper    Therapy/Group: Individual Therapy  Raylene Everts [facility] 12/08/2014, 3:39 PM

## 2014-12-08 NOTE — Progress Notes (Signed)
Nursing Note: Pt in and out cathed for 375 cc of clear yellow urine. Pt tolerated well.wbb

## 2014-12-08 NOTE — Progress Notes (Signed)
PHYSICAL MEDICINE & REHABILITATION     PROGRESS NOTE    Subjective/Complaints: Up at eob working on transfers with wife and OT. Both doing well! Had incomplete results with fleet enema today---emptied a bit more after program was completed this am  ROS: Pt denies  , rash/itching, headache, blurred or double vision, nausea, vomiting, abdominal pain, diarrhea, chest pain, shortness of breath, palpitations, dysuria, dizziness,  back pain, bleeding, anxiety, or depression    Objective: Vital Signs: Blood pressure 99/49, pulse 39, temperature 98.2 F (36.8 C), temperature source Oral, resp. rate 18, height  (1.727 m), weight 74.844 kg (165 lb), SpO2 97 %. No results found. No results for input(s): WBC, HGB, HCT, PLT in the last 72 hours. No results for input(s): NA, K, CL, GLUCOSE, BUN, CREATININE, CALCIUM in the last 72 hours.  Invalid input(s): CO CBG (last 3)  No results for input(s): GLUCAP in the last 72 hours.  Wt Readings from Last 3 Encounters:  12/06/14 74.844 kg (165 lb)  11/16/14 90.719 kg (200 lb)    Physical Exam:  Constitutional: He is oriented to person, place, and time. He appears well-developed and well-nourished.  HENT:  Head: Normocephalic and atraumatic.    Eyes: Conjunctivae are normal. Pupils are equal, round, and reactive to light.  Neck:  C Collar.  Cardiovascular: bradycardia and regular rhythm. Respiratory: Effort normal. No respiratory distress. He has no wheezes.  GI: Soft. Bowel sounds +.   Genitourinary: foley out Musculoskeletal: He exhibits tenderness along extensor compartment of left arm. He exhibits no edema.  Neurological: He is alert and oriented to person, place, and time. No cranial nerve deficit. Coordination normal.  Patient has normal sensation proximal to C6 dermatome. Patient has reduced sensation to pinprick but is able to identify the area touched at bilateral C6 through sacral levels Patient has decreased tone in  bilateral Lower extremities 4/5 bilateral deltoids, biceps, 4- triceps, 4 wrist extensors, trace right HI, 1+ to 2- left HI, right HF, KE and ADF/APF are 1 to 2-/5. Tr 1/5 left hip flexors and knee extensors ankle dorsiflexors and plantar flexors  Skin: Skin is warm and dry.  Cervical incision intact Psych: still quiet but more engaging   Assessment/Plan: 1. 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 Physiatrist is providing close team supervision and 24 hour management of active medical problems listed below. Physiatrist and rehab team continue to assess barriers to discharge/monitor patient progress toward functional and medical goals.  FIM: Function - Bathing Position: Bed Body parts bathed by patient: Right arm, Left arm, Chest, Abdomen Body parts bathed by helper: Back, Left lower leg, Right upper leg, Left upper leg, Right lower leg Assist Level: Assistive device Assistive Device Comment: Hospital bed functions; declined buttock hygiene, RN performed cathing during session and performed perineal hygiene.   Function- Upper Body Dressing/Undressing What is the patient wearing?: Pull over shirt/dress Pull over shirt/dress - Perfomed by patient: Thread/unthread right sleeve, Thread/unthread left sleeve, Put head through opening Pull over shirt/dress - Perfomed by helper: Pull shirt over trunk Assist Level: 2 helpers Set up : To obtain clothing/put away Function - Lower Body Dressing/Undressing What is the patient wearing?: Ted Hose, Pants, Socks Position: Bed Pants- Performed by helper: Thread/unthread right pants leg, Thread/unthread left pants leg, Pull pants up/down Non-skid slipper socks- Performed by helper: Don/doff right sock, Don/doff left sock Socks - Performed by patient:  Don/doff right sock, Don/doff left sock Socks - Performed by helper: Don/doff right sock,  Don/doff left sock Shoes - Performed by patient: Don/doff right shoe, Don/doff left shoe Shoes - Performed by helper: Don/doff right shoe, Don/doff left shoe, Fasten right, Fasten left TED Hose - Performed by helper: Don/doff right TED hose, Don/doff left TED hose Assist Level: Touching or steadying assistance (Pt > 75%)  Function - Toileting Toileting activity did not occur: No continent bowel/bladder event Toileting steps completed by helper: Adjust clothing prior to toileting, Performs perineal hygiene, Adjust clothing after toileting Assist level: Two helpers  Function - Archivist transfer activity did not occur: Safety/medical concerns Toilet transfer assistive device: Sliding board Assist level to toilet: 2 helpers Assist level from toilet: 2 helpers  Function - Chair/bed transfer Chair/bed transfer method: Lateral scoot Chair/bed transfer assist level: Moderate assist (Pt 50 - 74%/lift or lower) Chair/bed transfer assistive device: Armrests, Sliding board Chair/bed transfer details: Manual facilitation for weight shifting, Manual facilitation for placement, Verbal cues for technique  Function - Locomotion: Wheelchair Will patient use wheelchair at discharge?: Yes Type: Manual Max wheelchair distance: 150 Assist Level: Supervision or verbal cues Assist Level: Supervision or verbal cues Assist Level: Supervision or verbal cues Function - Locomotion: Ambulation Ambulation activity did not occur: Safety/medical concerns Walk 10 feet activity did not occur: Safety/medical concerns Walk 50 feet with 2 turns activity did not occur: Safety/medical concerns Walk 150 feet activity did not occur: Safety/medical concerns Walk 10 feet on uneven surfaces activity did not occur: Safety/medical concerns  Function - Comprehension Comprehension: Auditory Comprehension assist level: Follows complex conversation/direction with no assist  Function - Expression Expression:  Verbal Expression assist level: Expresses complex ideas: With no assist  Function - Social Interaction Social Interaction assist level: Interacts appropriately with others - No medications needed.  Function - Problem Solving Problem solving assist level: Solves complex problems: Recognizes & self-corrects  Function - Memory Memory assist level: Complete Independence: No helper Patient normally able to recall (first 3 days only): Location of own room, Staff names and faces, That he or she is in a hospital, Current season Medical Problem List and Plan: 1. Functional deficits secondary to C6 ASIA C incomplete tetraplegia, spinal cord injury due to fall with C5-C6 fracture and neurogenic bowel and bladder  -family ed underway 2. DVT Prophylaxis/Anticoagulation: Pharmaceutical: Lovenox to continue  -dopplers negative 3. Pain Management: Continue Lyrica BID for neuropathic pain.tramdol for mod and oxy for severe pain  -  sports cream for forearm prn 4. Mood: Team to provide ego support.    -continue to provide positive reinforcement 5. Neuropsych: This patient is capable of making decisions on his own behalf. 6. Skin/Wound Care: Routine pressure relief measures.  7. Fluids/Electrolytes/Nutrition: 100% meals. Encouraging fluids 8. JYN:[id number] negatie. afebrile  -ucx + for klebsiella oxytoca-- septra completed   9. 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  -spoke with cardiology (Dr. Delton See) who recommended ECHO---I have reviewed ECHO and it is completely normal---he has not been symptomatic from bradycardia, so we will continue manage conservatively  -encourage fluids  -continue florinef to better sustain BP's---they looked better yesterday  -limit pain meds (as possible)  -continue prn antivert for vestibular component of dizziness, treatment limited by  collar 10. Acutre renal failure---volume depletion--resolved   - continue to push po  - BUN fairly stable 11. Neurogenic bladder---I/O caths---no urge or sense of bladder  fullness yet  -continue to keep volumes 300-500cc 12. Neurogenic bowel: senna in pm and fleet enema qam with improved results-   -changed to senna without softener---may need to take extra time in am with enema before moving on  LOS (Days) 18 A FACE TO FACE EVALUATION WAS PERFORMED  SWARTZ,ZACHARY T 12/08/2014 11:06 AM

## 2014-12-08 NOTE — Progress Notes (Signed)
Physical Therapy Session Note  Patient Details  Name: Carl [name] MRN: [id number] Date of Birth: 05/13/1950  Today's Date: 12/08/2014 PT Individual Time: 0900-1000 PT Individual Time Calculation (min): 60 min   Short Term Goals: Week 2:  PT Short Term Goal 1 (Week 2): Pt will perform bed mobility rolling and supine <> sit with use of leg loops and max A of one person PT Short Term Goal 1 - Progress (Week 2): Met PT Short Term Goal 2 (Week 2): Pt will perform slideboard transfer bed <> chair on level surface with max A of one person and use of leg loops PT Short Term Goal 2 - Progress (Week 2): Met PT Short Term Goal 3 (Week 2): Pt will trial and perform w/c mobility in manual w/c x 50' with mod A  PT Short Term Goal 3 - Progress (Week 2): Met PT Short Term Goal 4 (Week 2): Pt will tolerate sitting up OOB x 4-6 hours and be independent with requesting pressure relief every 30 minutes PT Short Term Goal 4 - Progress (Week 2): Met PT Short Term Goal 5 (Week 2): Pt will tolerate standing in frame x 5 minutes for WB and core strengthening during dynamic UE activity PT Short Term Goal 5 - Progress (Week 2): Progressing toward goal Week 3:  PT Short Term Goal 1 (Week 3): Pt will perform bed mobility with use of leg loops and mod A PT Short Term Goal 2 (Week 3): Pt will perform bed <> w/c transfers with consistent mod A (on even surfaces) PT Short Term Goal 3 (Week 3): Pt will perform w/c mobility x 100' with min A in controlled environment PT Short Term Goal 4 (Week 3): Pt will tolerate dynamic standing balance activities and pre-gait activities with max A  PT Short Term Goal 5 (Week 3): Pt will maintain sitting balance during dynamic activities with min A  Skilled Therapeutic Interventions/Progress Updates:    Session focused on family education with pt's wife in regards to dressing at bed level (see below for details), performing toileting assist at bed level, and education on importance of  body mechanics, positioning, and set-up for home to prepare for d/c. Pt with 2 episodes of incontinent BM during bed mobility activities with therapist assisting wife and providing cues for technique for rolling and pt directing care. Pt using leg loops during session to assist with rolling to L and R with rails at min A level and verbal cues for technique. Educated on purpose of bowel program, as pt frustrated with continued stool during session and very apologetic. [name] also educated and return demonstrated donning Tedhose, abdominal binder and ACE wraps to BLE.   Therapy Documentation Precautions:  Precautions Precautions: Cervical, Fall Precaution Comments: Using BLE ACE wraps for BP control at this time. Monitor this Required Braces or Orthoses: Cervical Brace Cervical Brace: Hard collar, At all times Restrictions Weight Bearing Restrictions: No  Pain:  Denies pain.   Function:  Toileting Toileting     Toileting steps completed by helper: Adjust clothing prior to toileting;Performs perineal hygiene;Adjust clothing after toileting Toileting Assistive Devices: Grab bar or rail Assist level: Two helpers   Bed Mobility Roll left and right activity   Assist level: Touching or steadying assistance (Pt > 75%, lift 1 leg) Roll left and right assistive device: Bedrails;Other (comment) (leg loops)  Sit to lying activity        Lying to sitting activity        Mobility  details       Cognition Comprehension Comprehension assist level: Follows complex conversation/direction with no assist  Expression Expression assist level: Expresses complex ideas: With no assist  Social Interaction Social Interaction assist level: Interacts appropriately with others - No medications needed.  Problem Solving Problem solving assist level: Solves complex problems: Recognizes & self-corrects  Memory Memory assist level: Complete Independence: No helper    Therapy/Group: Individual  Therapy  Canary Brim Ivory Broad, PT, DPT  12/08/2014, 12:13 PM

## 2014-12-08 NOTE — Progress Notes (Signed)
Nursing Note: Pt was in and out cathed for 350 cc. No void. Pt tolerated well.wbb

## 2014-12-09 ENCOUNTER — Inpatient Hospital Stay (HOSPITAL_COMMUNITY): Payer: Federal, State, Local not specified - PPO | Admitting: Physical Therapy

## 2014-12-09 ENCOUNTER — Inpatient Hospital Stay (HOSPITAL_COMMUNITY): Payer: Federal, State, Local not specified - PPO | Admitting: Occupational Therapy

## 2014-12-09 NOTE — Progress Notes (Signed)
  [place] PHYSICAL MEDICINE & REHABILITATION     PROGRESS NOTE    Subjective/Complaints: E feels that he is getting better. He has no specific complaints. He admits that therapy is challenging for him.    Objective: Vital Signs: Blood pressure 116/61, pulse 45, temperature 98.6 F (37 C), temperature source Oral, resp. rate 18, height  (1.727 m), weight 165 lb (74.844 kg), SpO2 97 %.  Physical Exam:  o acute distress HEENT exam: Atraumatic, normocephalic Cardiac exam S1 and S2 are regular Chest clear to auscultation Abdominal exam active bowel sounds, soft Extremities no edema  Assessment/Plan: 1. 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 DVT Prophylaxis/Anticoagulation: Pharmaceutical: Lovenox to continue  -dopplers negative 3. Pain Management: Continue Lyrica BID for neuropathic pain.tramdol for mod and oxy for severe pain  -  sports cream for forearm prn 4. Mood: Team to provide ego support.    - 5. Neuropsych: This patient is capable of making decisions on his own behalf. 6. Skin/Wound Care: Routine pressure relief measures.  7. Fluids/Electrolytes/Nutrition: 100% meals. Encouraging fluids 8. WGN:FAOZHYQM negatie. afebrile  -ucx + for klebsiella oxytoca-- septra completed   9. CV/bradycardia: abd binder/compression stockings to maintain sitting/upright bp,    -bradycardia is asymptomatic. He has normal echocardiogram. No further evaluation necessary. 10. Acute renal failure----resolved  11. Neurogenic bladder---I/O caths---no urge or sense of bladder fullness yet  -continue to keep volumes 300-500cc 12. Neurogenic bowel:continue same bowel regimen.  LOS (Days) 19 A FACE TO FACE EVALUATION WAS PERFORMED  [facility] 12/09/2014 6:47 AM

## 2014-12-09 NOTE — Progress Notes (Signed)
Physical Therapy Session Note  Patient Details  Name: Carl [name] MRN: [id number] Date of Birth: Sep 10, 1950  Today's Date: 12/09/2014 PT Individual Time: 1500-1545 PT Individual Time Calculation (min): 45 min   Short Term Goals: Week 3:  PT Short Term Goal 1 (Week 3): Pt will perform bed mobility with use of leg loops and mod A PT Short Term Goal 2 (Week 3): Pt will perform bed <> w/c transfers with consistent mod A (on even surfaces) PT Short Term Goal 3 (Week 3): Pt will perform w/c mobility x 100' with min A in controlled environment PT Short Term Goal 4 (Week 3): Pt will tolerate dynamic standing balance activities and pre-gait activities with max A  PT Short Term Goal 5 (Week 3): Pt will maintain sitting balance during dynamic activities with min A  Skilled Therapeutic Interventions/Progress Updates:    Pt received seated in TIS w/c; no c/o pain and agreeable to treatment. TEDs, ace wraps and abdominal binder intact. Pt cued to instruct therapist on preparing for transfer to standard w/c as it was first time for therapist seeing this pt; discussed importance of being able to direct care and ask for assistance when necessary. Pt able to instruct therapist in set-up of transfer with transfer board and guiding cues for setup including removal of leg rests, reducing tilt of chair, etc. Transfer to L side with transfer board minA/CGA, including assist to place board, and minA to adjust foot position mid-transfer. W/c propulsion outdoors on unlevel surfaces with minA due to casters getting stuck in sidewalk cracks, difficulty on L sloping surfaces due to LUE weakness>RUE. Occasional rest breaks due to UE fatigue. Pt returned to room and instructed in transfer to R side; discussed importance of being able to transfer either direction to accommodate for situational circumstances that may prevent transfer to preferred side. Transfer performed minA as above to return to TIS w/c. Pt set timer for pressure  relief without cueing.  Pt left semi-reclined in w/c with all needs within reach.   Therapy Documentation Precautions:  Precautions Precautions: Cervical, Fall Precaution Comments: Using BLE ACE wraps for BP control at this time. Monitor this Required Braces or Orthoses: Cervical Brace Cervical Brace: Hard collar, At all times Restrictions Weight Bearing Restrictions: No Pain: Pain Assessment Pain Assessment: No/denies pain Pain Score: 0-No pain   Function:    Transfers Sit to stand transfer        Chair/bed transfer   Chair/bed transfer method: Lateral scoot Chair/bed transfer assist level: Touching or steadying assistance (Pt > 75%) Chair/bed transfer assistive device: Armrests;Sliding board   Chair/bed transfer details: Manual facilitation for placement;Verbal cues for technique;Verbal cues for sequencing   Toilet transfer                Car transfer          Locomotion Ambulation          Walk 10 feet activity      Walk 50 feet with 2 turns activity      Walk 150 feet activity      Walk 10 feet on uneven surfaces activity      Stairs          Walk up/down 1 step activity        Walk up/down 4 steps activity      Walk up/down 12 steps activity      Pick up small objects from floor      Wheelchair   Type: Manual Max wheelchair  distance: 150 Assist Level: Touching or steadying assistance (Pt > 75%)  Wheel 50 feet with 2 turns activity   Assist Level: Touching or steadying assistance (Pt > 75%)  Wheel 150 feet activity   Assist Level: Touching or steadying assistance (Pt > 75%) (outdoors on unlevel surfaces, including inclines/decline)    Therapy/Group: Individual Therapy  Vista Lawman 12/09/2014, 3:49 PM

## 2014-12-09 NOTE — Progress Notes (Signed)
Occupational Therapy Session Note  Patient Details  Name: Carl [name] MRN: [id number] Date of Birth: 1950/08/27       Short Term Goals: Week 1:  OT Short Term Goal 1 (Week 1): Pt will bathe in supine with mod A OT Short Term Goal 1 - Progress (Week 1): Not met OT Short Term Goal 2 (Week 1): Pt will direct care for bed mobility with min questioning cues OT Short Term Goal 2 - Progress (Week 1): Met OT Short Term Goal 3 (Week 1): Pt will self feed 50% of meal with min A OT Short Term Goal 3 - Progress (Week 1): Met Week 2:  OT Short Term Goal 1 (Week 2): Pt will thread B UEs into shirt in order to increase independence with ADLs OT Short Term Goal 1 - Progress (Week 2): Met OT Short Term Goal 2 (Week 2): Pt will place toothpaste on toothbrush with set-up to open container during grooming task to increase independence with ADLs OT Short Term Goal 2 - Progress (Week 2): Met OT Short Term Goal 3 (Week 2): Pt will manipulate water cup and bring to mouth independently in order to increase independence with ADLs.  OT Short Term Goal 3 - Progress (Week 2): Met Today's Date: 12/09/2014  OT Individual Time:  -   1345-1430  (45 min)    Skilled Therapeutic Interventions/Progress Updates:  Pt lying in bed upon OT arrival.  Donned TEDs, ACE wraps, pants,Abdominal binder,l eg loops and shoes.  Pt rolled during activity to right and left 6 times to each side with bed rails and min assist.  Went from sidelying to sitting with max assist for positioning RUE under pt so he could assist with pushing up.  Transferred to wc with sliding board with min assist with pt directing care during transfer.  Pt. Brushed teeth at sink with set up assist.  Left in room with nursing staff.  See Functional levels for more information.     Therapy Documentation Precautions:  Precautions Precautions: Cervical, Fall Precaution Comments: Using BLE ACE wraps for BP control at this time. Monitor this Required Braces or  Orthoses: Cervical Brace Cervical Brace: Hard collar, At all times Restrictions Weight Bearing Restrictions: No General:   Vital Signs: Therapy Vitals Temp: 98.6 F (37 C) Temp Source: Oral Pulse Rate: (!) 49 Resp: 18 BP: 110/70 mmHg Patient Position (if appropriate): Sitting Oxygen Therapy SpO2: 97 % O2 Device: Not Delivered Pain: Pain Assessment Pain Assessment: No/denies pain Pain Score: 0-No pain        Function:     Grooming Oral Care,Brush Teeth, Clean Dentures Activity:      Assist Level: Set up Assistive Device Comment: Set-up with built up gripper on toothbrush Set up : To open containers  Wash, Rinse, Dry Face Activity Wash,rinse,dry face activity did not occur: Safety/medical concerns            Lower Body Dressing/Undressing Lower body dressing   What is the patient wearing?: Ted Hose;Shoes;Pants (leg loops, abd binder, ace wraps)       Pants- Performed by helper: Thread/unthread right pants leg;Thread/unthread left pants leg;Pull pants up/down   Non-skid slipper socks- Performed by helper: Don/doff right sock;Don/doff left sock   Socks - Performed by helper: Don/doff right sock;Don/doff left sock   Shoes - Performed by helper: Don/doff right shoe;Don/doff left shoe;Fasten right;Fasten left       TED Hose - Performed by helper: Don/doff right TED hose;Don/doff left TED hose  Lower  body assist Assist Level: Touching or steadying assistance (Pt > 75%)       Toileting Toileting          Toileting assist      Bed Mobility Roll left and right activity   Assist level: Mod assist (Pt 50 - 74%, lift 2 legs) Roll left and right assistive device: Bedrails  Sit to lying activity Sit to lying acitivty did not occur: Safety/medical/concerns Assist level: Max assist (Pt 25 - 49%)    Lying to sitting activity   Assist level: Maximal assist (Pt 25 - 49%) Lying to sitting assistive device: Bedrails  Mobility details Bed mobility details:  Manual facilitation for placement;Manual facilitation for weight shifting    Transfers Sit to stand transfer        Chair/bed transfer   Chair/bed transfer method: Lateral scoot Chair/bed transfer assist level: Touching or steadying assistance (Pt > 75%) Chair/bed transfer assistive device: Armrests;Sliding board   Chair/bed transfer details: Manual facilitation for placement;Verbal cues for technique;Verbal cues for sequencing  Therapy/Group: Individual Therapy  Lisa Roca 12/09/2014, 7:23 PM

## 2014-12-10 ENCOUNTER — Inpatient Hospital Stay (HOSPITAL_COMMUNITY): Payer: Federal, State, Local not specified - PPO

## 2014-12-10 DIAGNOSIS — S12400D Unspecified displaced fracture of fifth cervical vertebra, subsequent encounter for fracture with routine healing: Secondary | ICD-10-CM

## 2014-12-10 NOTE — Plan of Care (Signed)
Problem: RH SAFETY Goal: RH STG ADHERE TO SAFETY PRECAUTIONS W/ASSISTANCE/DEVICE STG Adhere to Safety Precautions With max Assistance/Device.  Outcome: Progressing Pt oob w/ maxi-move by staff.

## 2014-12-10 NOTE — Progress Notes (Signed)
Nursing Note: Pt in and out cathed by Maralyn Sago, NT for 350 cc. Pt tolerated well.wbb

## 2014-12-10 NOTE — Plan of Care (Signed)
Problem: SCI BLADDER ELIMINATION Goal: RH STG MANAGE BLADDER WITH ASSISTANCE STG Manage Bladder With Max Assistance  Outcome: Progressing Staff perform in and out caths Q6 hrs.

## 2014-12-10 NOTE — Progress Notes (Signed)
Physical Therapy Session Note  Patient Details  Name: Carl [name] MRN: [id number] Date of Birth: 1951-04-05  Today's Date: 12/10/2014 PT Individual Time: 0830-0915 PT Individual Time Calculation (min): 45 min   Short Term Goals: Week 3:  PT Short Term Goal 1 (Week 3): Pt will perform bed mobility with use of leg loops and mod A PT Short Term Goal 2 (Week 3): Pt will perform bed <> w/c transfers with consistent mod A (on even surfaces) PT Short Term Goal 3 (Week 3): Pt will perform w/c mobility x 100' with min A in controlled environment PT Short Term Goal 4 (Week 3): Pt will tolerate dynamic standing balance activities and pre-gait activities with max A  PT Short Term Goal 5 (Week 3): Pt will maintain sitting balance during dynamic activities with min A  Skilled Therapeutic Interventions/Progress Updates:   Family education regarding discussion with pt and wife in regards to d/c planning including specialty w/c evaluation (benefits and obstacles in regards to manual vs. Power), obtaining measurements of home and car (wife expressed main concern is getting in and out of the car) which she states she is still working on completing these, and accessibility of the home.  Discussed trial of simulated car transfer first, once measurements obtained, and then real car practice prior to d/c towards the end of the week. Both in agreement. Wife only able to stay first 15 min of session and then had to leave, so no hands on training performed this session.  Therapist donned Tedhose, ACE wraps for BLE, leg loops and abdominal binder as well as pants to prepare for OOB while discussion in regards to d/c planning with pt. Pt performed rolling with min to mod A using rails to assist several times throughout with cues by therapist for technique. See details for bed mobility and transfer using slideboard below. Pt set up in TIS w/c with all needs in reach. No complaints of lightheadedness during session.    Therapy Documentation Precautions:  Precautions Precautions: Cervical, Fall Precaution Comments: Using BLE ACE wraps for BP control at this time. Monitor this Required Braces or Orthoses: Cervical Brace Cervical Brace: Hard collar, At all times Restrictions Weight Bearing Restrictions: No Pain: Denies pain     Function:   Bed Mobility Roll left and right activity   Assist level: Mod assist (Pt 50 - 74%, lift 2 legs) Roll left and right assistive device: Bedrails  Sit to lying activity        Lying to sitting activity   Assist level: Maximal assist (Pt 25 - 49%)    Mobility details Bed mobility details: Manual facilitation for placement;Manual facilitation for weight shifting;Verbal cues for techniques   Transfers Sit to stand transfer        Chair/bed transfer   Chair/bed transfer method: Lateral scoot Chair/bed transfer assist level: Moderate assist (Pt 50 - 74%/lift or lower) Chair/bed transfer assistive device: Armrests;Sliding board   Chair/bed transfer details: Manual facilitation for weight shifting;Verbal cues for technique;Manual facilitation for placement   Toilet transfer                Car transfer          Locomotion Ambulation          Walk 10 feet activity      Walk 50 feet with 2 turns activity      Walk 150 feet activity      Walk 10 feet on uneven surfaces activity  Stairs          Walk up/down 1 step activity        Walk up/down 4 steps activity      Walk up/down 12 steps activity      Pick up small objects from floor      Wheelchair   Type: Manual Max wheelchair distance: 10 Assist Level: Dependent (Pt equals 0%)  Wheel 50 feet with 2 turns activity      Wheel 150 feet activity       Cognition Comprehension Comprehension assist level: Follows complex conversation/direction with no assist  Expression Expression assist level: Expresses complex ideas: With no assist  Social Interaction Social Interaction  assist level: Interacts appropriately with others - No medications needed.  Problem Solving Problem solving assist level: Solves complex problems: Recognizes & self-corrects  Memory Memory assist level: Complete Independence: No helper    Therapy/Group: Individual Therapy  Karolee Stamps Darrol Poke, PT, DPT  12/10/2014, 10:56 AM

## 2014-12-10 NOTE — Progress Notes (Signed)
Nursing Note: Pt in and out cathed for 275 cc by Sarah,NT.Pt tolerated well.wbb

## 2014-12-10 NOTE — Plan of Care (Signed)
Problem: SCI BOWEL ELIMINATION Goal: RH STG MANAGE BOWEL WITH ASSISTANCE STG Manage Bowel with total Assistance.  Outcome: Progressing Daily bowel program,fleets enema @ 6 am performed by staff.

## 2014-12-10 NOTE — Progress Notes (Signed)
  [name] PHYSICAL MEDICINE & REHABILITATION     PROGRESS NOTE    Subjective/Complaints: Generally feels well. No complaints this morning. His wife is present in the room this morning.    Objective: Vital Signs: Blood pressure 95/47, pulse 45, temperature 98.8 F (37.1 C), temperature source Oral, resp. rate 17, height  (1.727 m), weight 165 lb (74.844 kg), SpO2 95 %.  Physical Exam:  no acute distress HEENT exam: Atraumatic, normocephalic Cardiac exam S1 and S2 are regular Chest clear to auscultation Abdominal exam active bowel sounds, soft Extremities no edema  Assessment/Plan: 1. 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 DVT Prophylaxis/Anticoagulation: Pharmaceutical: Lovenox to continue  -dopplers negative 3. Pain Management: Continue Lyrica BID for neuropathic pain.tramdol for mod and oxy for severe pain  -  sports cream for forearm prn 4. Mood: Team to provide ego support.    - 5. Neuropsych: This patient is capable of making decisions on his own behalf. 6. Skin/Wound Care: Routine pressure relief measures.  7. Fluids/Electrolytes/Nutrition: 100% meals. Encouraging fluids 8. UEA:[id number] negatie. afebrile  -ucx + for klebsiella oxytoca-- septra completed   9. CV/bradycardia: abd binder/compression stockings to maintain sitting/upright bp,    -bradycardia is asymptomatic. He has normal echocardiogram. No further evaluation necessary. bp 95/47-110/70 10. Acute renal failure----resolved  11. Neurogenic bladder---I/O caths---no urge or sense of bladder fullness yet  -continue to keep volumes 300-500cc 12. Neurogenic bowel:continue same bowel regimen.  LOS (Days) 20 A FACE TO FACE EVALUATION WAS PERFORMED  SWORDS,Carl [name] 12/10/2014 8:22 AM

## 2014-12-11 ENCOUNTER — Inpatient Hospital Stay (HOSPITAL_COMMUNITY): Payer: Federal, State, Local not specified - PPO | Admitting: Occupational Therapy

## 2014-12-11 ENCOUNTER — Inpatient Hospital Stay (HOSPITAL_COMMUNITY): Payer: Federal, State, Local not specified - PPO

## 2014-12-11 NOTE — Progress Notes (Signed)
Physical Therapy Session Note  Patient Details  Name: Carl [name] MRN: [id number] Date of Birth: [birth year]  Today's Date: 12/11/2014 PT Individual Time: 1000-1100 PT Individual Time Calculation (min): 60 min   Short Term Goals: Week 3:  PT Short Term Goal 1 (Week 3): Pt will perform bed mobility with use of leg loops and mod A PT Short Term Goal 2 (Week 3): Pt will perform bed <> w/c transfers with consistent mod A (on even surfaces) PT Short Term Goal 3 (Week 3): Pt will perform w/c mobility x 100' with min A in controlled environment PT Short Term Goal 4 (Week 3): Pt will tolerate dynamic standing balance activities and pre-gait activities with max A  PT Short Term Goal 5 (Week 3): Pt will maintain sitting balance during dynamic activities with min A  Skilled Therapeutic Interventions/Progress Updates:    Session focused on family education with pt's wife, Carl Bible in regards to transfer training for basic bed <-> w/c transfers and therapist performed and introduced car transfer with pt and wife. Verbal cues needed for correct set-up and sequence of w/c set-up for transfer and technique but pt's wife able to perform at mod/max A level. Cues to let patient do as much as he can first before she assists so she is not just lifting the patient. Discussed and planning for real car transfer tomorrow to start to prepare for home. Pt require max A with therapist using slideboard for car transfer with cues for technique, set-up, and manual faciliation for weightshifting.  Also discussed getting specialty w/c evaluation started to determine what he will be going home with as well to prepare for further transfer training.     Therapy Documentation Precautions:  Precautions Precautions: Cervical, Fall Precaution Comments: Using BLE ACE wraps for BP control at this time. Monitor this Required Braces or Orthoses: Cervical Brace Cervical Brace: Hard collar, At all times Restrictions Weight Bearing  Restrictions: No Pain: Pain Assessment Pain Assessment: 0-10 Pain Score: 0-No pain    Therapy/Group: Individual Therapy  Karolee Stamps Darrol Poke, PT, DPT  12/11/2014, 12:27 PM

## 2014-12-11 NOTE — Progress Notes (Signed)
Occupational Therapy Session Note  Patient Details  Name: Carl [name] MRN: [id number] Date of Birth: 1951/01/12  Today's Date: 12/11/2014 OT Individual Time: 0900-1000 OT Individual Time Calculation (min): 60 min    Short Term Goals: Week 1:  OT Short Term Goal 1 (Week 1): Pt will bathe in supine with mod A OT Short Term Goal 1 - Progress (Week 1): Not met OT Short Term Goal 2 (Week 1): Pt will direct care for bed mobility with min questioning cues OT Short Term Goal 2 - Progress (Week 1): Met OT Short Term Goal 3 (Week 1): Pt will self feed 50% of meal with min A OT Short Term Goal 3 - Progress (Week 1): Met Week 2:  OT Short Term Goal 1 (Week 2): Pt will thread B UEs into shirt in order to increase independence with ADLs OT Short Term Goal 1 - Progress (Week 2): Met OT Short Term Goal 2 (Week 2): Pt will place toothpaste on toothbrush with set-up to open container during grooming task to increase independence with ADLs OT Short Term Goal 2 - Progress (Week 2): Met OT Short Term Goal 3 (Week 2): Pt will manipulate water cup and bring to mouth independently in order to increase independence with ADLs.  OT Short Term Goal 3 - Progress (Week 2): Met  Skilled Therapeutic Interventions/Progress Updates:    Addressed pt/wife education with bed mobiltiy, donning ace wraps and binder, and transfers.  Wife assisted in donning ace wraps for one leg with instructional cues.  Wife engaged in transfer from bed to wc using lateral scoot with pillow stuffed between bed and mattress.  See below.    Therapy Documentation Precautions:  Precautions Precautions: Cervical, Fall Precaution Comments: Using BLE ACE wraps for BP control at this time. Monitor this Required Braces or Orthoses: Cervical Brace Cervical Brace: Hard collar, At all times Restrictions Weight Bearing Restrictions: No General:   Vital Signs:  Pain: Pain Assessment Pain Assessment: 0-10 Pain Score: 0-No pain ADL:    Exercises:   Other Treatments:    Function:   Eating Eating               Grooming Oral Care,Brush Teeth, Clean Dentures Activity:      Assist Level: More than reasonable amount of time Assistive Device Comment: Set-up with built up gripper on toothbrush    Wash, Rinse, Dry Face Activity          Wash, Rinse, Dry Hands Activity          Brush, Comb Hair Activity        Shave Activity          Apply Makeup Activity                                                             Bathing Bathing position      Bathing parts      Bathing assist         Upper Body Dressing/Undressing Upper body dressing                    Upper body assist         Lower Body Dressing/Undressing Lower body dressing   What is the patient wearing?: Ted Hose;Shoes;Pants  Pants- Performed by helper: Thread/unthread right pants leg;Thread/unthread left pants leg;Pull pants up/down   Non-skid slipper socks- Performed by helper: Don/doff right sock;Don/doff left sock   Socks - Performed by helper: Don/doff right sock;Don/doff left sock           TED Hose - Performed by helper: Don/doff right TED hose;Don/doff left TED hose  Lower body assist Assist Level: 2 Helpers       Toileting Toileting          Toileting assist      Bed Mobility Roll left and right activity   Assist level: Mod assist (Pt 50 - 74%, lift 2 legs) Roll left and right assistive device: Bedrails  Sit to lying activity        Lying to sitting activity   Assist level: Maximal assist (Pt 25 - 49%) Lying to sitting assistive device: Bedrails  Mobility details Bed mobility details: Manual facilitation for placement;Manual facilitation for weight shifting;Verbal cues for techniques    Transfers Sit to stand transfer        Chair/bed transfer   Chair/bed transfer method: Lateral scoot Chair/bed transfer assist level: Moderate assist (Pt 50 - 74%/lift or lower) Chair/bed  transfer assistive device: Armrests;Sliding board   Chair/bed transfer details: Manual facilitation for weight shifting;Verbal cues for technique;Manual facilitation for placement;Verbal cues for safe use of DME/AE  Toilet transfer                Tub/shower transfer             Cognition Comprehension Comprehension assist level: Follows complex conversation/direction with no assist  Expression Expression assist level: Expresses complex ideas: With no assist  Social Interaction Social Interaction assist level: Interacts appropriately with others - No medications needed.  Problem Solving Problem solving assist level: Solves complex problems: Recognizes & self-corrects  Memory Memory assist level: Complete Independence: No helper    Therapy/Group: Individual Therapy  Lisa Roca 12/11/2014, 12:29 PM

## 2014-12-11 NOTE — Progress Notes (Signed)
Physical Therapy Session Note  Patient Details  Name: Carl [name] MRN: [id number] Date of Birth: 1951-02-28  Today's Date: 12/11/2014 PT Individual Time: 1300-1400 PT Individual Time Calculation (min): 60 min   Short Term Goals: Week 3:  PT Short Term Goal 1 (Week 3): Pt will perform bed mobility with use of leg loops and mod A PT Short Term Goal 2 (Week 3): Pt will perform bed <> w/c transfers with consistent mod A (on even surfaces) PT Short Term Goal 3 (Week 3): Pt will perform w/c mobility x 100' with min A in controlled environment PT Short Term Goal 4 (Week 3): Pt will tolerate dynamic standing balance activities and pre-gait activities with max A  PT Short Term Goal 5 (Week 3): Pt will maintain sitting balance during dynamic activities with min A  Skilled Therapeutic Interventions/Progress Updates:    Session focused on bed mobility retraining for supine to sit using leg loops and hospital bed functions (min A overall), functional transfer training with slideboard at overall min A level with assist for slideboard placement and cues for w/c set-up for transfers and forward weightshift, manual w/c propulsion for mobility training and functional UE strengthening/endurance x 120' at S level in hallway, and neuro re-ed for sit to stands from elevated surface and parallel bars in front of pt with total A +2 x multiple trials and focus on upright posture, WB through BLE, and overall standing tolerance. During standing activities provided manual facilitation at quads and therapist blocking knees from buckling with second person assisting with tactile cues for posture (pt with flexed tendency). Handoff to OT for next session in gym.  Therapy Documentation Precautions:  Precautions Precautions: Cervical, Fall Precaution Comments: Using BLE ACE wraps for BP control at this time. Monitor this Required Braces or Orthoses: Cervical Brace Cervical Brace: Hard collar, At all  times Restrictions Weight Bearing Restrictions: No Pain: Pain Assessment Pain Assessment: 0-10 Pain Score: 0-No pain    Therapy/Group: Individual Therapy  Karolee Stamps Darrol Poke, PT, DPT  12/11/2014, 2:52 PM

## 2014-12-11 NOTE — Progress Notes (Signed)
Occupational Therapy Session Note  Patient Details  Name: Carl [name] MRN: [id number] Date of Birth: 04-12-1950  Today's Date: 12/11/2014 OT Individual Time: 1400-1500 OT Individual Time Calculation (min): 60 min    Skilled Therapeutic Interventions/Progress Updates:    Pt placed in supine position in the gym after just finishing with PT session.  Applied NMES to digit flexors of the left hand.  Utilized electrical stimulation just on flexors for 5 mins with pt demonstrating 50% digit flexion with therapist stabilizing the wrist.  No adverse reactions noted to stimuli with intensity set on level 30.  Transitioned to alternating stimuli between flexors and extensors of the digits.  Flexors still on level 28-30 (increased intensity as we went along) and extensors set on level 26.  Settings of e-stim at 50 pps, with of current.  Performed for 20 mins alternating between flexion and extension with pt working on AROM with each stimuli.  No adverse reactions noted.  Pt transitioned to sitting with max assist and use of leg loops to assist with bending knees to roll.  Transfer to wheelchair with max assist and no sliding board.  He was able to roll his wheelchair 3/4 of the way back to his room with supervision.  Therapist assisted with transfer back to bed with max assist again without use of the sliding board.    Therapy Documentation Precautions:  Precautions Precautions: Cervical, Fall Precaution Comments: Using BLE ACE wraps for BP control at this time. Monitor this Required Braces or Orthoses: Cervical Brace Cervical Brace: Hard collar, At all times Restrictions Weight Bearing Restrictions: No General:   Vital Signs: Therapy Vitals Temp: 98.8 F (37.1 C) Temp Source: Oral Pulse Rate: (!) 43 Resp: 14 BP: (!) 111/58 mmHg Patient Position (if appropriate): Lying Oxygen Therapy SpO2: 96 % O2 Device: Not Delivered Pain: Pain Assessment Pain Assessment: No/denies  pain Pain Score: 0-No pain    Function:            Bed Mobility Roll left and right activity   Assist level: Mod assist (Pt 50 - 74%, lift 2 legs) Roll left and right assistive device: Bedrails  Sit to lying activity   Assist level: Max assist (Pt 25 - 49%)    Lying to sitting activity   Assist level: Total assist (Pt < 25%) Lying to sitting assistive device: Other (comment);HOB elevated;Bedrails (leg loops)  Mobility details Bed mobility details: Manual facilitation for placement;Manual facilitation for weight shifting;Verbal cues for techniques    Transfers Sit to stand transfer   Sit to stand assist level: 2 helpers Sit to stand assistive device: Other;Orthosis (parallel bars)  Chair/bed transfer   Chair/bed transfer method: Lateral scoot Chair/bed transfer assist level: Maximal assist (Pt 25 - 49%/lift and lower) Chair/bed transfer assistive device: Armrests   Chair/bed transfer details: Manual facilitation for weight shifting;Verbal cues for technique;Verbal cues for safe use of DME/AE  Toilet transfer                Tub/shower transfer             Cognition Comprehension Comprehension assist level: Follows complex conversation/direction with no assist  Expression Expression assist level: Expresses complex ideas: With no assist  Social Interaction Social Interaction assist level: Interacts appropriately 90% of the time - Needs monitoring or encouragement for participation or interaction.  Problem Solving Problem solving assist level: Solves complex problems: Recognizes & self-corrects  Memory Memory assist level: Complete Independence: No helper    Therapy/Group: Individual Therapy  Marjorie Deprey OTR/L 12/11/2014, 3:52 PM

## 2014-12-11 NOTE — Progress Notes (Signed)
[facility] PHYSICAL MEDICINE & REHABILITATION     PROGRESS NOTE    Subjective/Complaints: Bowel program working well. Had a pretty quiet weekend.   ROS: Pt denies  , rash/itching, headache, blurred or double vision, nausea, vomiting, abdominal pain, diarrhea, chest pain, shortness of breath, palpitations, dysuria, dizziness,  back pain, bleeding, anxiety, or depression    Objective: Vital Signs: Blood pressure 97/48, pulse 74, temperature 98.3 F (36.8 C), temperature source Oral, resp. rate 17, height  (1.727 m), weight 74.844 kg (165 lb), SpO2 96 %. No results found. No results for input(s): WBC, HGB, HCT, PLT in the last 72 hours. No results for input(s): NA, K, CL, GLUCOSE, BUN, CREATININE, CALCIUM in the last 72 hours.  Invalid input(s): CO CBG (last 3)  No results for input(s): GLUCAP in the last 72 hours.  Wt Readings from Last 3 Encounters:  12/06/14 74.844 kg (165 lb)  11/16/14 90.719 kg (200 lb)    Physical Exam:  Constitutional: He is oriented to person, place, and time. He appears well-developed and well-nourished.  HENT:  Head: Normocephalic and atraumatic.    Eyes: Conjunctivae are normal. Pupils are equal, round, and reactive to light.  Neck:  C Collar.  Cardiovascular: bradycardia and regular rhythm. Respiratory: Effort normal. No respiratory distress. He has no wheezes.  GI: Soft. Bowel sounds +.   Genitourinary: foley out Musculoskeletal: He exhibits tenderness along extensor compartment of left arm. He exhibits no edema.  Neurological: He is alert and oriented to person, place, and time. No cranial nerve deficit. Coordination normal.  Patient has normal sensation proximal to C6 dermatome. Patient has reduced sensation to pinprick but is able to identify the area touched at bilateral C6 through sacral levels Patient has decreased tone in bilateral Lower extremities 4/5 bilateral deltoids, biceps, 4- triceps, 4 wrist extensors, trace right HI, 1+  to 2- left HI, right HF, KE and ADF/APF are 1 to 2-/5. Tr 1/5 left hip flexors and knee extensors ankle dorsiflexors and plantar flexors  Skin: Skin is warm and dry.  Cervical incision intact Psych: still quiet but more engaging   Assessment/Plan: 1. 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 Physiatrist is providing close team supervision and 24 hour management of active medical problems listed below. Physiatrist and rehab team continue to assess barriers to discharge/monitor patient progress toward functional and medical goals.  FIM: Function - Bathing Position: Bed Body parts bathed by patient: Right arm, Left arm, Chest, Abdomen Body parts bathed by helper: Back, Left lower leg, Right upper leg, Left upper leg, Right lower leg Assist Level: Assistive device Assistive Device Comment: Hospital bed functions; declined buttock hygiene, RN performed cathing during session and performed perineal hygiene.   Function- Upper Body Dressing/Undressing What is the patient wearing?: Pull over shirt/dress Pull over shirt/dress - Perfomed by patient: Thread/unthread right sleeve, Thread/unthread left sleeve, Put head through opening Pull over shirt/dress - Perfomed by helper: Pull shirt over trunk Assist Level: 2 helpers Set up : To obtain clothing/put away Function - Lower Body Dressing/Undressing What is the patient wearing?: Ted Hose, Shoes, Pants (leg loops, abdominal binder, ACE wraps) Position: Bed Pants- Performed by helper: Thread/unthread right pants leg, Thread/unthread left pants leg, Pull pants up/down Non-skid slipper socks- Performed by helper: Don/doff right sock, Don/doff left sock Socks - Performed by patient: Don/doff right sock, Don/doff left sock Socks - Performed by helper: Don/doff right  sock, Don/doff left sock Shoes - Performed by patient: Don/doff right shoe,  Don/doff left shoe Shoes - Performed by helper: Don/doff right shoe, Don/doff left shoe, Fasten right, Fasten left TED Hose - Performed by helper: Don/doff right TED hose, Don/doff left TED hose Assist Level: Touching or steadying assistance (Pt > 75%)  Function - Toileting Toileting activity did not occur: No continent bowel/bladder event Toileting steps completed by helper: Adjust clothing prior to toileting, Performs perineal hygiene, Adjust clothing after toileting Toileting Assistive Devices: Grab bar or rail Assist level: Two helpers  Function - Archivist transfer activity did not occur: Safety/medical concerns Toilet transfer assistive device: Drop arm commode Assist level to toilet: Maximal assist (Pt 25 - 49%/lift and lower) Assist level from toilet: Maximal assist (Pt 25 - 49%/lift and lower)  Function - Chair/bed transfer Chair/bed transfer method: Lateral scoot Chair/bed transfer assist level: Moderate assist (Pt 50 - 74%/lift or lower) Chair/bed transfer assistive device: Armrests, Sliding board Chair/bed transfer details: Manual facilitation for weight shifting, Verbal cues for technique, Manual facilitation for placement  Function - Locomotion: Wheelchair Will patient use wheelchair at discharge?: Yes Type: Manual Max wheelchair distance: 10 Assist Level: Dependent (Pt equals 0%) Assist Level: Touching or steadying assistance (Pt > 75%) Assist Level: Touching or steadying assistance (Pt > 75%) (outdoors on unlevel surfaces, including inclines/decline) Function - Locomotion: Ambulation Ambulation activity did not occur: Safety/medical concerns Walk 10 feet activity did not occur: Safety/medical concerns Walk 50 feet with 2 turns activity did not occur: Safety/medical concerns Walk 150 feet activity did not occur: Safety/medical concerns Walk 10 feet on uneven surfaces activity did not occur: Safety/medical concerns  Function -  Comprehension Comprehension: Auditory Comprehension assist level: Follows complex conversation/direction with no assist  Function - Expression Expression: Verbal Expression assist level: Expresses complex ideas: With no assist  Function - Social Interaction Social Interaction assist level: Interacts appropriately with others - No medications needed.  Function - Problem Solving Problem solving assist level: Solves complex problems: Recognizes & self-corrects  Function - Memory Memory assist level: Complete Independence: No helper Patient normally able to recall (first 3 days only): Location of own room, Staff names and faces, That he or she is in a hospital, Current season Medical Problem List and Plan: 1. Functional deficits secondary to C6 ASIA C incomplete tetraplegia, spinal cord injury due to fall with C5-C6 fracture and neurogenic bowel and bladder  -family ed underway 2. DVT Prophylaxis/Anticoagulation: Pharmaceutical: Lovenox to continue  -dopplers negative 3. Pain Management: Continue Lyrica BID for neuropathic pain.tramdol for mod and oxy for severe pain  -  sports cream for forearm prn 4. Mood: Team to provide ego support.    -continue to provide positive reinforcement 5. Neuropsych: This patient is capable of making decisions on his own behalf. 6. Skin/Wound Care: Routine pressure relief measures.  7. Fluids/Electrolytes/Nutrition: 100% meals. Encouraging fluids 8. ZOX:WRUEAVWU negatie. afebrile  9. 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  -spoke with cardiology (Dr. Delton See) who recommended ECHO---I have reviewed ECHO and it is completely normal---he has not been symptomatic from bradycardia, so we will continue manage conservatively  -encourage fluids  -continue florinef to better sustain BP's---they looked better yesterday  -limit pain  meds (as possible)  -continue prn antivert for vestibular component of dizziness, treatment limited by collar 10. Acutre renal failure---volume depletion--resolved   - continue to push po  - BUN fairly  stable 11. Neurogenic bladder---I/O caths---no urge or sense of bladder fullness yet  -continue to keep volumes 300-500cc 12. Neurogenic bowel: senna in pm and fleet enema qam with improved results-   LOS (Days) 21 A FACE TO FACE EVALUATION WAS PERFORMED  SWARTZ,ZACHARY T 12/11/2014 9:09 AM

## 2014-12-12 ENCOUNTER — Inpatient Hospital Stay (HOSPITAL_COMMUNITY): Payer: Federal, State, Local not specified - PPO

## 2014-12-12 ENCOUNTER — Inpatient Hospital Stay (HOSPITAL_COMMUNITY): Payer: Federal, State, Local not specified - PPO | Admitting: Physical Therapy

## 2014-12-12 ENCOUNTER — Inpatient Hospital Stay (HOSPITAL_COMMUNITY): Payer: Federal, State, Local not specified - PPO | Admitting: Occupational Therapy

## 2014-12-12 NOTE — Progress Notes (Signed)
Occupational Therapy Session Note  Patient Details  Name: Carl [name] MRN: [id number] Date of Birth: 1950-09-10  Today's Date: 12/12/2014 OT Individual Time: 1300-1400 OT Individual Time Calculation (min): 60 min    Short Term Goals: Week 3:  OT Short Term Goal 1 (Week 3): Pt will thread B LEs into pants with set-up assist in circle sit position OT Short Term Goal 2 (Week 3): Pt will complete functional transfers with min A. OT Short Term Goal 3 (Week 3): Pt will direct caregiver with sliding board transfer with no VCs. OT Short Term Goal 4 (Week 3): Pt will bathe LB with min A following set-up of circle sit position.   Skilled Therapeutic Interventions/Progress Updates:    1:1 Focus on family education with pt and pt's wife. Performed dressing in supine with HOB slightly elevated, rolling side to side, sidelying to sitting, slide board transfer to w/c, discussed d/c recommendations and options for DME and post d/c therapy pros/ cons, performing grooming at sink with setup (A for shaving). Pt able to perform supervision slide board transfer but trained wife to provide min A for safety.  She was able to Assist pt with management of LEs with leg loops donned. She continued to practice donning TED hose and ace wrapping on LEs. Pt able to propel self around room to get to sink.    Therapy Documentation Precautions:  Precautions Precautions: Cervical, Fall Precaution Comments: Using BLE ACE wraps for BP control at this time. Monitor this Required Braces or Orthoses: Cervical Brace Cervical Brace: Hard collar, At all times Restrictions Weight Bearing Restrictions: No General:   Vital Signs: Therapy Vitals Temp: 98.5 F (36.9 C) Temp Source: Oral Pulse Rate: (!) 44 Resp: 16 BP: (!) 100/54 mmHg Patient Position (if appropriate): Sitting Oxygen Therapy SpO2: 97 % O2 Device: Not Delivered Pain: Pain Assessment Pain Assessment: 0-10 Pain Score: 0-No pain ADL:   Exercises:    Other Treatments:    Function:   Eating Eating               Grooming Oral Care,Brush Teeth, Clean Dentures Activity:      Assist Level: More than reasonable amount of time Assistive Device Comment: Set-up with built up gripper on toothbrush Set up : To open containers  Wash, Rinse, Dry Face Activity Wash,rinse,dry face activity did not occur: Safety/medical concerns Assist Level: Set up   Set up : To obtain items  Wash, Rinse, Dry Hands Activity          Brush, Comb Hair Activity Brush,comb hair activity did not occur: Patient has no hair      Shave Activity   Assist Level: Helper performed activity      Apply Makeup Activity Apply makeup activity did not occur: Patient does not wear makeup                                                           Bathing Bathing position   Position:  (Nursing performs at night time)  Bathing parts      Bathing assist         Upper Body Dressing/Undressing Upper body dressing   What is the patient wearing?: Pull over shirt/dress     Pull over shirt/dress - Perfomed by patient: Thread/unthread right sleeve;Thread/unthread left sleeve;Put head through  opening Pull over shirt/dress - Perfomed by helper: Pull shirt over trunk        Upper body assist Assist Level: Touching or steadying assistance(Pt > 75%)   Set up : To obtain clothing/put away   Lower Body Dressing/Undressing Lower body dressing   What is the patient wearing?: Pants;Shoes;Ted Hose (and ace wraps)       Pants- Performed by helper: Thread/unthread right pants leg;Thread/unthread left pants leg;Pull pants up/down           Shoes - Performed by helper: Don/doff right shoe;Don/doff left shoe;Fasten right;Fasten left       TED Hose - Performed by helper: Don/doff right TED hose;Don/doff left TED hose  Lower body assist Assist Level: Touching or steadying assistance (Pt > 75%)       Toileting Toileting          Toileting assist       Bed Mobility Roll left and right activity   Assist level: Mod assist (Pt 50 - 74%, lift 2 legs)    Sit to lying activity   Assist level: Mod assist (Pt 50 - 74%, lift 2 legs)    Lying to sitting activity   Assist level: Moderate assist (Pt 50 - 74%, lift 2 legs)    Mobility details      Transfers Sit to stand transfer        Chair/bed transfer   Chair/bed transfer method: Lateral scoot Chair/bed transfer assist level: Touching or steadying assistance (Pt > 75%) Chair/bed transfer assistive device: Armrests;Sliding board;Orthosis      Toilet transfer                Tub/shower transfer             Cognition Comprehension Comprehension assist level: Follows complex conversation/direction with no assist  Expression Expression assist level: Expresses complex ideas: With no assist  Social Interaction Social Interaction assist level: Interacts appropriately with others - No medications needed.  Problem Solving Problem solving assist level: Solves complex problems: Recognizes & self-corrects  Memory Memory assist level: More than reasonable amount of time    Therapy/Group: Individual Therapy  Roney Mans [facility] 12/12/2014, 3:25 PM

## 2014-12-12 NOTE — Progress Notes (Signed)
Recreational Therapy Session Note  Patient Details  Name: Carl [name] MRN: [id number] Date of Birth: 12-24-1950 Today's Date: 12/12/2014  Pain: no c/o Skilled Therapeutic Interventions/Progress Updates: Session focused on family education with pt & wife.  Goal of session was to demonstrate and return demonstration for car transfers usi ng sliding board.  Pt required mod assist with +2 for safety. Verbal cues required for problem solving through the transfer and for discharge planning in regards to community pursuits and energy conservation. Both stated understanding.  Therapy/Group: Co-Treatment   Hermelinda Diegel 12/12/2014, 3:57 PM

## 2014-12-12 NOTE — Patient Care Conference (Signed)
Inpatient RehabilitationTeam Conference and Plan of Care Update Date: 12/12/2014   Time: 2:00 PM    Patient Name: Carl [name]      Medical Record Number: [id number]  Date of Birth: [birth year] Sex: Male         Room/Bed: 4M07C/4M07C-01 Payor Info: Payor: [employer]UE [employer] BLUE [employer] / Plan: BCBS/[employer] PPO / Product Type: *No Product type* /    Admitting Diagnosis: C5-6 FX WITH INCOMPLETE SCI   Admit Date/Time:  11/20/2014  5:57 PM Admission Comments: No comment available   Primary Diagnosis:  C6 spinal cord injury Principal Problem: C6 spinal cord injury  Patient Active Problem List   Diagnosis Date Noted  . Orthostatic hypotension 11/30/2014  . C6 spinal cord injury 11/20/2014  . Neurogenic bowel 11/20/2014  . Neurogenic bladder 11/20/2014  . Fever of unknown origin   . Quadriplegia 11/14/2014  . C5 vertebral fracture 11/09/2014    Expected Discharge Date: Expected Discharge Date: 12/19/14  Team Members Present: Physician leading conference: Dr. Faith Rogue Social Worker Present: Amada Jupiter, LCSW Nurse Present: Other (comment) Cheri Guppy, RN) PT Present: Karolee Stamps, PT OT Present: Roney Mans, OT SLP Present: Feliberto Gottron, SLP Other (Discipline and Name): Ottie Glazier, RN [facility]) PPS Coordinator present : Tora Duck, RN, [facility]     Current Status/Progress Goal Weekly Team Focus  Medical   neurogenic bowel and bladder. i and o caths. bowel program effective  improve ability to manage bowel and bladder  bp and cv mgt, establishment of bowel program   Bowel/Bladder   Bowel program- 6 am enema; intermittent cath q8h prn no void  Total assist  Educate family on bowel and bladder program and begin hands on practice.   Swallow/Nutrition/ Hydration             ADL's   bathing goal d/c max A for dressing, bed mobility mod A, slide board transfers, grooming at sink with setup  UB dressing with supervision, LB max A, transfers to [facility] minimal   family education, bed  mobility, sitting balance, activity tolerance, d/c planning   Mobility   mod A bed mobility, min to mod A slideboard transfers, short distance manual w/c min A, total A for standing, fam ed started  min-mod A w/c level  postural control and balance, strengthening, endurance, family education, d/c planning, w/c evaluation   Communication             Safety/Cognition/ Behavioral Observations            Pain   Scheduled ultram 50 mg effective for controlling patients pain  < 4  Assess and treat for pain q shift and prn   Skin   Neck incision OTA, clean dry and intact  Patients skin will remain free from breakdown or infection with mod assist.  Assess skin q shift and prn; turn q2h    Rehab Goals Patient on target to meet rehab goals: Yes *See Care Plan and progress notes for long and short-term goals.  Barriers to Discharge: ongoing neuro deficits, wife has been a little hesitant    Possible Resolutions to Barriers:  continue education, training, acclimation    Discharge Planning/Teaching Needs:  home with wife to provide 24/7 assistance  family education underway   Team Discussion:  Family education underway with wife.   HR and bowels improved.  Likely home with I/O caths if wife able to be trained and comfortable with doing this.  Do not want staff to use MAXI MOVE -  should be using transfer board (wife cleared to do this).  W/c seating eval tomorrow.  Making good progress.  Revisions to Treatment Plan:  None   Continued Need for Acute Rehabilitation Level of Care: The patient requires daily medical management by a physician with specialized training in physical medicine and rehabilitation for the following conditions: Daily direction of a multidisciplinary physical rehabilitation program to ensure safe treatment while eliciting the highest outcome that is of practical value to the patient.: Yes Daily medical management of patient stability for increased activity during  participation in an intensive rehabilitation regime.: Yes Daily analysis of laboratory values and/or radiology reports with any subsequent need for medication adjustment of medical intervention for : Post surgical problems;Neurological problems  Carl [name] 12/12/2014, 3:09 PM

## 2014-12-12 NOTE — Progress Notes (Signed)
Physical Therapy Session Note  Patient Details  Name: Carl [name] MRN: [id number] Date of Birth: 1950-09-15  Today's Date: 12/12/2014 PT Individual Time: 1515-1540 PT Individual Time Calculation (min): 25 min   Short Term Goals: Week 4:  PT Short Term Goal 1 (Week 4): = LTGs with focus on Fam Ed for d/c 9/13  Skilled Therapeutic Interventions/Progress Updates:    Pt received in TIS w/c and agreeable to therapy.  PT had patient instruct PT in slide board transfer from [place] w/c to bed with occasional verbal cues for sequencing and assist to place slide board.  Pt performed SBT w/c>bed with min A to scoot.  Pt transitioned to supine in bed through L sidelying with mod A.  Pt positioned in bed and PT instructed patient in BLE therex: ankle pumps and quad/glute sets x20, heel slides x5 (PROM on LLE), bridging x10, and hip add in hook lying x10.  Pt positioned to comfort at end of session with call bell in reach and needs met.    Therapy Documentation Precautions:  Precautions Precautions: Cervical, Fall Precaution Comments: Using BLE ACE wraps for BP control at this time. Monitor this Required Braces or Orthoses: Cervical Brace Cervical Brace: Hard collar, At all times Restrictions Weight Bearing Restrictions: No  Pain: Pain Assessment Pain Assessment: 0-10 Pain Score: 0-No pain  Therapy/Group: Individual Therapy  Earnest Conroy Penven-Crew 12/12/2014, 4:44 PM

## 2014-12-12 NOTE — Progress Notes (Signed)
Physical Therapy Weekly Progress Note  Patient Details  Name: Carl [name] MRN: [id number] Date of Birth: 1950-10-05  Beginning of progress report period: December 05, 2014 End of progress report period: December 12, 2014  Patient has met 4 of 5 short term goals. Pt is making good progress though still limited by the below impairments. Family education has been initiated and planning for speciality w/c evaluation tomorrow.   Patient continues to demonstrate the following deficits: quadriparesis, decreased balance, decreased strength, decreased endurance, decreased  and therefore will continue to benefit from skilled PT intervention to enhance overall performance with activity tolerance, balance, postural control, ability to compensate for deficits, functional use of  right upper extremity, right lower extremity, left upper extremity and left lower extremity and coordination.  Patient progressing toward long term goals..  Continue plan of care.  PT Short Term Goals Week 3:  PT Short Term Goal 1 (Week 3): Pt will perform bed mobility with use of leg loops and mod A PT Short Term Goal 1 - Progress (Week 3): Met PT Short Term Goal 2 (Week 3): Pt will perform bed <> w/c transfers with consistent mod A (on even surfaces) PT Short Term Goal 2 - Progress (Week 3): Met (with slideboard) PT Short Term Goal 3 (Week 3): Pt will perform w/c mobility x 100' with min A in controlled environment PT Short Term Goal 3 - Progress (Week 3): Met PT Short Term Goal 4 (Week 3): Pt will tolerate dynamic standing balance activities and pre-gait activities with max A  PT Short Term Goal 4 - Progress (Week 3): Not met PT Short Term Goal 5 (Week 3): Pt will maintain sitting balance during dynamic activities with min A PT Short Term Goal 5 - Progress (Week 3): Met Week 4:  PT Short Term Goal 1 (Week 4): = LTGs with focus on Fam Ed for d/c 9/13  Skilled Therapeutic Interventions/Progress Updates:  Ambulation/gait  training;Balance/vestibular training;Community reintegration;Discharge planning;Disease management/prevention;DME/adaptive equipment instruction;Functional mobility training;Neuromuscular re-education;Pain management;Patient/family education;Psychosocial support;Skin care/wound management;Splinting/orthotics;Stair training;Therapeutic Activities;Therapeutic Exercise;UE/LE Strength taining/ROM;UE/LE Coordination activities;Wheelchair propulsion/positioning   Therapy Documentation Precautions:  Precautions Precautions: Cervical, Fall Precaution Comments: Using BLE ACE wraps for BP control at this time. Monitor this Required Braces or Orthoses: Cervical Brace Cervical Brace: Hard collar, At all times Restrictions Weight Bearing Restrictions: No   Function:   Bed Mobility Roll left and right activity   Assist level: Mod assist (Pt 50 - 74%, lift 2 legs)    Sit to lying activity   Assist level: Mod assist (Pt 50 - 74%, lift 2 legs)    Lying to sitting activity   Assist level: Moderate assist (Pt 50 - 74%, lift 2 legs)    Mobility details     Transfers         Chair/bed transfer   Chair/bed transfer method: Lateral scoot Chair/bed transfer assist level: Touching or steadying assistance (Pt > 75%) Chair/bed transfer assistive device: Armrests;Sliding board;Orthosis                        Car transfer   Car transfer assistive device: Sliding board;Orthosis Car transfer assist level: 2 helpers (mod +2 for safety)    Therapy/Group: Individual Therapy  Canary Brim Ivory Broad, PT, DPT  12/12/2014, 12:37 PM

## 2014-12-12 NOTE — Progress Notes (Addendum)
[facility]YSICAL [facility]ICINE & REHABILITATION     PROGRESS NOTE    Subjective/Complaints: No issues yesterday. Pain controlled. Slept well  ROS: Pt denies  , rash/itching, headache, blurred or double vision, nausea, vomiting, abdominal pain, diarrhea, chest pain, shortness of breath, palpitations, dysuria, dizziness,  back pain, bleeding, anxiety, or depression    Objective: Vital Signs: Blood pressure 101/53, pulse 43, temperature 99 F (37.2 C), temperature source Oral, resp. rate 16, height 5\' 8"  (1.727 m), weight 74.844 kg (165 lb), SpO2 95 %. No results found. No results for input(s): WBC, HGB, HCT, PLT in the last 72 hours. No results for input(s): NA, K, CL, GLUCOSE, BUN, CREATININE, CALCIUM in the last 72 hours.  Invalid input(s): CO CBG (last 3)  No results for input(s): GLUCAP in the last 72 hours.  Wt Readings from Last 3 Encounters:  12/06/14 74.844 kg (165 lb)  11/16/14 90.719 kg (200 lb)    Physical Exam:  Constitutional: He is oriented to person, place, and time. He appears well-developed and well-nourished.  HENT:  Head: Normocephalic and atraumatic.    Eyes: Conjunctivae are normal. Pupils are equal, round, and reactive to light.  Neck:  C Collar in place  Cardiovascular: bradycardia and regular rhythm. Respiratory: Effort normal. No respiratory distress. He has no wheezes.  GI: Soft. Bowel sounds +.   Genitourinary: foley out Musculoskeletal: He exhibits tenderness along extensor compartment of left arm. He exhibits no edema.  Neurological: He is alert and oriented to person, place, and time. No cranial nerve deficit. Coordination normal.  Patient has normal sensation proximal to C6 dermatome. Reduced PP/LT from C6 through sacral levels Patient has decreased tone in bilateral Lower extremities 4/5 bilateral deltoids, biceps, 4- triceps, 4 wrist extensors, trace right HI, 1+ to 2- left HI, right HF, KE and ADF/APF are 1 to 2-/5. Tr 1/5 left hip flexors  and knee extensors ankle dorsiflexors and plantar flexors  Skin: Skin is warm and dry.  Cervical incision healed Psych: still quiet but more engaging   Assessment/Plan: 1. 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 Physiatrist is providing close team supervision and 24 hour management of active medical problems listed below. Physiatrist and rehab team continue to assess barriers to discharge/monitor patient progress toward functional and medical goals.  FIM: Function - Bathing Position: Bed Body parts bathed by patient: Right arm, Left arm, Chest, Abdomen Body parts bathed by helper: Back, Left lower leg, Right upper leg, Left upper leg, Right lower leg Assist Level: Assistive device Assistive Device Comment: Hospital bed functions; declined buttock hygiene, RN performed cathing during session and performed perineal hygiene.   Function- Upper Body Dressing/Undressing What is the patient wearing?: Pull over shirt/dress Pull over shirt/dress - Perfomed by patient: Thread/unthread right sleeve, Thread/unthread left sleeve, Put head through opening Pull over shirt/dress - Perfomed by helper: Pull shirt over trunk Assist Level: 2 helpers Set up : To obtain clothing/put away Function - Lower Body Dressing/Undressing What is the patient wearing?: Ted Hose, Shoes, Pants Position: Bed Pants- Performed by helper: Thread/unthread right pants leg, Thread/unthread left pants leg, Pull pants up/down Non-skid slipper socks- Performed by helper: Don/doff right sock, Don/doff left sock Socks - Performed by patient: Don/doff right sock, Don/doff left sock Socks - Performed by helper: Don/doff right sock, Don/doff left sock Shoes - Performed by patient: Don/doff right shoe, Don/doff left shoe Shoes - Performed by helper:  Don/doff right shoe, Don/doff left shoe, Fasten right, Fasten left TED Hose  - Performed by helper: Don/doff right TED hose, Don/doff left TED hose Assist Level: 2 Helpers  Function - Toileting Toileting activity did not occur: No continent bowel/bladder event Toileting steps completed by helper: Adjust clothing prior to toileting, Performs perineal hygiene, Adjust clothing after toileting Toileting Assistive Devices: Grab bar or rail Assist level: Two helpers  Function - Archivist transfer activity did not occur: Safety/medical concerns Toilet transfer assistive device: Drop arm commode Assist level to toilet: Maximal assist (Pt 25 - 49%/lift and lower) Assist level from toilet: Maximal assist (Pt 25 - 49%/lift and lower)  Function - Chair/bed transfer Chair/bed transfer method: Lateral scoot Chair/bed transfer assist level: Maximal assist (Pt 25 - 49%/lift and lower) Chair/bed transfer assistive device: Armrests Chair/bed transfer details: Manual facilitation for weight shifting, Verbal cues for technique, Verbal cues for safe use of DME/AE  Function - Locomotion: Wheelchair Will patient use wheelchair at discharge?: Yes Type: Manual Max wheelchair distance: 120 Assist Level: Supervision or verbal cues Assist Level: Touching or steadying assistance (Pt > 75%) Assist Level: Touching or steadying assistance (Pt > 75%) (outdoors on unlevel surfaces, including inclines/decline) Function - Locomotion: Ambulation Ambulation activity did not occur: Safety/medical concerns Walk 10 feet activity did not occur: Safety/medical concerns Walk 50 feet with 2 turns activity did not occur: Safety/medical concerns Walk 150 feet activity did not occur: Safety/medical concerns Walk 10 feet on uneven surfaces activity did not occur: Safety/medical concerns  Function - Comprehension Comprehension: Auditory Comprehension assist level: Follows complex conversation/direction with no assist  Function - Expression Expression: Verbal Expression assist level:  Expresses complex ideas: With no assist  Function - Social Interaction Social Interaction assist level: Interacts appropriately with others - No medications needed.  Function - Problem Solving Problem solving assist level: Solves complex problems: Recognizes & self-corrects  Function - Memory Memory assist level: Complete Independence: No helper Patient normally able to recall (first 3 days only): Location of own room, Staff names and faces, That he or she is in a hospital, Current season Medical Problem List and Plan: 1. Functional deficits secondary to C6 ASIA C incomplete tetraplegia, spinal cord injury due to fall with C5-C6 fracture and neurogenic bowel and bladder  -family ed progressing 2. DVT Prophylaxis/Anticoagulation: Pharmaceutical: Lovenox to continue  -dopplers negative 3. Pain Management: Continue Lyrica BID for neuropathic pain.tramdol for mod and oxy for severe pain  -  sports cream for forearm prn---this as improved  -pain controlled fairly well at present 4. Mood: Team to provide ego support.    -continue to provide positive reinforcement 5. Neuropsych: This patient is capable of making decisions on his own behalf. 6. Skin/Wound Care: Routine pressure relief measures.  7. Fluids/Electrolytes/Nutrition: 100% meals. Encouraging fluids 8. ZOX:WRUEAVWU negatie. afebrile  9. CV/bradycardia: abd binder/compression stockings to maintain sitting/upright bp,    -  bradycardia due to lost sympathetic tone--some subtle improvement/remains asymptomatic.  -continue to obsv HR  -encourage fluids  -continue florinef to better sustain BP's---they looked better yesterday  -limit pain meds (as possible)  -continue prn antivert for vestibular component of dizziness, treatment limited by collar 10. Acutre renal failure---volume depletion--resolved   - continue to push po  - BUN fairly stable 11. Neurogenic bladder---I/O caths---no urge or sense of bladder fullness  yet  -continue to keep volumes 300-500cc 12. Neurogenic bowel: senna in pm and fleet enema qam with improved results   LOS (Days) 22 A FACE  TO FACE EVALUATION WAS PERFORMED  SWARTZ,ZACHARY T 12/12/2014 7:46 AM

## 2014-12-12 NOTE — Progress Notes (Signed)
Occupational Therapy Session Note  Patient Details  Name: Carl [name] MRN: [id number] Date of Birth: 05/06/1950  Today's Date: 12/12/2014 OT Individual Time: 1300-1400 OT Individual Time Calculation (min): 60 min   Short Term Goals: Week 3:  OT Short Term Goal 1 (Week 3): Pt will thread B LEs into pants with set-up assist in circle sit position OT Short Term Goal 2 (Week 3): Pt will complete functional transfers with min A. OT Short Term Goal 3 (Week 3): Pt will direct caregiver with sliding board transfer with no VCs. OT Short Term Goal 4 (Week 3): Pt will bathe LB with min A following set-up of circle sit position.   Skilled Therapeutic Interventions/Progress Updates: Therapeutic activity with focus on transfers, sitting balance, core strengthening, bed mobility.   Pt completes bed mobility using leg loops, HOB elevated and min assist to lift trunk after pt places both legs over edge of bed.   Pt directs caregivers for placement of sliding board and completes slide board transfer with steadying assist to stabilize w/c during transfer.   Pt able to complete stand pivot transfer from w/c to raised mat with max assist to lift and mange LLE.   Pt able to maintain static standing balance with mod assist and min vc for postural control.   OT educated pt on use of transfer disc for continued practice at standing due to improved patient participation with second stand.   Pt completes trunk control activity, ball handling/passing, and functional reaching while sitting supported at edge of mat with +2 helper posterior to provide stabilization assist as needed during ball handling.   Pt returns to w/c using lateral scoot method with max assist to lift and lower during transfer.   Pt left in his room seated in tilt-in-space w/c at end of session with all needs within reach.     Therapy Documentation Precautions:  Precautions Precautions: Cervical, Fall Precaution Comments: Using BLE ACE wraps for BP  control at this time. Monitor this Required Braces or Orthoses: Cervical Brace Cervical Brace: Hard collar, At all times Restrictions Weight Bearing Restrictions: No  Vital Signs: Therapy Vitals Temp: 98.5 F (36.9 C) Temp Source: Oral Pulse Rate: (!) 44 Resp: 16 BP: (!) 100/54 mmHg Patient Position (if appropriate): Sitting Oxygen Therapy SpO2: 97 % O2 Device: Not Delivered   Pain: Pain Assessment Pain Assessment: 0-10 Pain Score: 0-No pain  Therapy/Group: Individual Therapy  Rhiann Boucher 12/12/2014, 2:58 PM

## 2014-12-12 NOTE — Progress Notes (Signed)
Physical Therapy Session Note  Patient Details  Name: Carl [name] MRN: [id number] Date of Birth: 1950/07/25  Today's Date: 12/12/2014 PT Individual Time: 1000-1130 PT Individual Time Calculation (min): 90 min   Short Term Goals: Week 3:  PT Short Term Goal 1 (Week 3): Pt will perform bed mobility with use of leg loops and mod A PT Short Term Goal 1 - Progress (Week 3): Met PT Short Term Goal 2 (Week 3): Pt will perform bed <> w/c transfers with consistent mod A (on even surfaces) PT Short Term Goal 2 - Progress (Week 3): Met (with slideboard) PT Short Term Goal 3 (Week 3): Pt will perform w/c mobility x 100' with min A in controlled environment PT Short Term Goal 3 - Progress (Week 3): Met PT Short Term Goal 4 (Week 3): Pt will tolerate dynamic standing balance activities and pre-gait activities with max A  PT Short Term Goal 4 - Progress (Week 3): Not met PT Short Term Goal 5 (Week 3): Pt will maintain sitting balance during dynamic activities with min A PT Short Term Goal 5 - Progress (Week 3): Met  Skilled Therapeutic Interventions/Progress Updates:    No complaints of pain. Session focused on family education with pt and pt's wife in regards to real car transfer (from manual w/c to [facility] (22")) with min to mod A (+2 for safety) and verbal cues for problem solving and safe technique, manual w/c propulsion on uneven surfaces outdoors (up to total A needed on inclines) and S on level surfaces in hallway, re-educate pt and wife on supine BLE therex, ROM, and stretching exercises with handouts given again (family not sure if still have it in the room), basic transfers using slideboard on slightly uneven surfaces at steady A level with assist to place slideboard and cues for sequencing. See details below for mobility details. Educated pt and wife on importance of bringing in manual w/c that they may have access to to determine if appropriate for patient use, discussion in regards to possible need  to buy cushion out of pocket for that w/c, planning for w/c speciality evaluation tomorrow and purpose, and education and discussion in regards to energy conservation and community mobility for community reintegration.   Therapy Documentation Precautions:  Precautions Precautions: Cervical, Fall Precaution Comments: Using BLE ACE wraps for BP control at this time. Monitor this Required Braces or Orthoses: Cervical Brace Cervical Brace: Hard collar, At all times Restrictions Weight Bearing Restrictions: No   Function:    Bed Mobility Roll left and right activity   Assist level: Mod assist (Pt 50 - 74%, lift 2 legs)    Sit to lying activity   Assist level: Mod assist (Pt 50 - 74%, lift 2 legs)    Lying to sitting activity   Assist level: Moderate assist (Pt 50 - 74%, lift 2 legs)    Mobility details     Transfers Sit to stand transfer        Chair/bed transfer   Chair/bed transfer method: Lateral scoot Chair/bed transfer assist level: Touching or steadying assistance (Pt > 75%) Chair/bed transfer assistive device: Armrests;Sliding board;Orthosis       Garment/textile technologist transfer assistive device: Sliding board;Orthosis Car transfer assist level: 2 helpers (mod +2 for safety)     Therapy/Group: Individual Therapy and Co-Treatment with Recreational Therapy  Canary Brim Ivory Broad, PT,  DPT  12/12/2014, 12:18 PM

## 2014-12-13 ENCOUNTER — Inpatient Hospital Stay (HOSPITAL_COMMUNITY): Payer: Federal, State, Local not specified - PPO | Admitting: Physical Therapy

## 2014-12-13 ENCOUNTER — Inpatient Hospital Stay (HOSPITAL_COMMUNITY): Payer: Federal, State, Local not specified - PPO | Admitting: *Deleted

## 2014-12-13 ENCOUNTER — Inpatient Hospital Stay (HOSPITAL_COMMUNITY): Payer: Federal, State, Local not specified - PPO | Admitting: Occupational Therapy

## 2014-12-13 NOTE — Progress Notes (Signed)
Social Work Patient ID: Carl [name], male   DOB: [birth year], 64 y.o.   MRN: [id number]   Have reviewed team conference information with pt and planning towards d/c next week.  Currently being seen for wc seating eval.  Discussed other DME.  Pt feeling ready for d/c next week - need to discuss follow up further as team recommends HH follow up to start.  Continue to follow.  Milano Rosevear, LCSW

## 2014-12-13 NOTE — Progress Notes (Signed)
Recreational Therapy Discharge Summary Patient Details  Name: Daxtyn Rottenberg MRN: [id number] Date of Birth: 1950/05/19 Today's Date: 12/13/2014 Comments on progress toward goals: Pt has made great progress toward goals and is ready for discharge home on 12/19/14 with family to provide 24 hour assistance.  Pt is discharging at min assist level for simple TR task seated w/c level and even set up assist at times.  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  Education provided in regards to energy level and energy conservation techniques especially with community pursuits. Reasons for discharge: discharge from hospital  Patient/family agrees with progress made and goals achieved: Yes  Melany Wiesman 12/13/2014, 12:22 PM

## 2014-12-13 NOTE — Progress Notes (Signed)
Occupational Therapy Session Note  Patient Details  Name: Carl [name] MRN: [id number] Date of Birth: 03-04-1951  Today's Date: 12/13/2014 OT Individual Time: 0900-1000 OT Individual Time Calculation (min): 60 min    Short Term Goals: Week 3:  OT Short Term Goal 1 (Week 3): Pt will thread B LEs into pants with set-up assist in circle sit position OT Short Term Goal 2 (Week 3): Pt will complete functional transfers with min A. OT Short Term Goal 3 (Week 3): Pt will direct caregiver with sliding board transfer with no VCs. OT Short Term Goal 4 (Week 3): Pt will bathe LB with min A following set-up of circle sit position.   Skilled Therapeutic Interventions/Progress Updates:    1:1 Continued family education today with pt and wife on self care aspects of his care and d/c planning. Continued practiced with wife to don TEDS, Ace wraps and abdominal binder, bed mobility including rolling and coming to EOB. Discussed option for voiding of bowel on BSC. Transferred to drop arm commode with the slide board (while sitting on bed pad due to not having pants on).  Performed lateral leans to remove pad.  Discussed positioning for putting in enema in; lateral lean position. Threaded pants in sitting on BSC with total A but pt able to use bilateral hands to assist with pulling them up to thighs; then Focused on lateral leans to assist wife during pants management. Transferred to tilt in space squat pivot with wife. Wife unable to assist pt enough during transfer still requiring max A from therapist- still recommending using slide board with wife for all transfers.   Therapy Documentation Precautions:  Precautions Precautions: Cervical, Fall Precaution Comments: Using BLE ACE wraps for BP control at this time. Monitor this Required Braces or Orthoses: Cervical Brace Cervical Brace: Hard collar, At all times Restrictions Weight Bearing Restrictions: No Pain: Pain Assessment Pain Assessment: No/denies  pain Pain Score: 0-No pain  See Function Navigator for Current Functional Status.   Therapy/Group: Individual Therapy  Roney Mans [facility] 12/13/2014, 11:46 AM

## 2014-12-13 NOTE — Progress Notes (Signed)
Occupational Therapy Note  Patient Details  Name: Carl [name] MRN: [id number] Date of Birth: October 28, 1950  Today's Date: 12/13/2014 OT Individual Time: 1000-1100 OT Individual Time Calculation (min): 60 min   Pt denied pain Individual Therapy  Pt seated in w/c at sink upon arrival washing face.  Pt transitioned to therapy gym and performed SBT to therapy mat, requiring assistance to position sliding board.  Pt engaged in BUE therex including block pushups, and modified pushups while seated.  Pt also engaged in dynamic sitting tasks while tossing ball seated unsupported.  Pt required min A for sitting balance while engaged in this activity. Recreational Therapist joined therapy session with approx 15 mins remaining and assisted with dynamic sitting tasks.  Pt also engaged in core strengthening activities/exercises.  Pt continues to sit with posterior pelvic tilt and rounded shoulders.  Pt is able to extend trunk with upright shoulders for short periods of time (15 seconds). Pt states that sitting unsupported is very exhausting.  Pt requested to return to be at end of session and performed SBT transfer with supervision but required assistance with placing BLE into bed and positioning in bed.  Focus on dynamic sitting balance, core strengthening, BUE therex/use, functional transfers, and safety awareness to increased independence with BADLs and decrease burden of care.  Lavone Neri [facility] 12/13/2014, 12:06 PM

## 2014-12-13 NOTE — Progress Notes (Signed)
Physical Therapy Session Note  Patient Details  Name: Carl [name] MRN: [id number] Date of Birth: 1950/04/28  Today's Date: 12/13/2014 PT Individual Time: 0076-2263 PT Individual Time Calculation (min): 120 min   Short Term Goals: Week 3:  PT Short Term Goal 1 (Week 3): Pt will perform bed mobility with use of leg loops and mod A PT Short Term Goal 1 - Progress (Week 3): Met PT Short Term Goal 2 (Week 3): Pt will perform bed <> w/c transfers with consistent mod A (on even surfaces) PT Short Term Goal 2 - Progress (Week 3): Met (with slideboard) PT Short Term Goal 3 (Week 3): Pt will perform w/c mobility x 100' with min A in controlled environment PT Short Term Goal 3 - Progress (Week 3): Met PT Short Term Goal 4 (Week 3): Pt will tolerate dynamic standing balance activities and pre-gait activities with max A  PT Short Term Goal 4 - Progress (Week 3): Not met PT Short Term Goal 5 (Week 3): Pt will maintain sitting balance during dynamic activities with min A PT Short Term Goal 5 - Progress (Week 3): Met  Skilled Therapeutic Interventions/Progress Updates:   Pt received in bed; ATP present for specialty power w/c evaluation.  Pt's family also brought in loaner manual w/c for PT to assess for community transportation until able to purchase lift for power w/c.  Manual w/c noted to be aged [employer] manual w/c with basic vinyl seating, leg rests with fixed length, removable arm rests with fixed height and 16 x 16 in size.  Pt agreeable to transfer to manual w/c to assess fit.  With [facility] elevated pt performed supine > sit with leg loops and rail with supervision-min A and extra time with verbal cues for sequence.  Performed transfers bed > manual w/c > tilt in space with slideboard and supervision-min A.  Once in manual w/c fit assessed.  Pt hips noted to be touching each side of seat base and seat depth noted to be too short; also unable to lock leg rest fully in place.  Pt plans to be in w/c  2-3 hours at the most for church.  Discussed with pt if there were no other loaner options for community, PT recommending purchase of basic cushion and Elements or tension adjustable back for improved positioning and skin protection.  Also discussed pt level of fatigue when up in manual w/c.  ATP questioning pt about when he and his wife plan to purchase a lift for their car; pt reports they hope to soon.  ATP offering to allow pt to use loaner manual w/c from [employer] for community mobility until able to purchase lift for car to transport power w/c.  Will continue to discuss with pt and wife when present.    Proceeded with w/c evaluation once in tilt in space w/c.  ATP engaged PT and pt in discussion about current functional level, ROM, strength, tone, posture, seating requirements and justification.  ATP and PT recommending [employer] 3 Mid-wheel Drive Power Mobility with power tilt and power recline for pressure relief and BP management, manual elevating leg rests, skin protection and positioning cushion, low contour back and head rest.  Pt agreeable to power mobility.  ATP obtained measurements and will proceed with home evaluation.  ATP will attempt to have trial/loaner power w/c to patient by Friday to continue family education, transfer training and w/c mobility training.    Therapy Documentation Precautions:  Precautions Precautions: Cervical, Fall Precaution Comments: Using BLE  ACE wraps for BP control at this time. Monitor this Required Braces or Orthoses: Cervical Brace Cervical Brace: Hard collar, At all times Restrictions Weight Bearing Restrictions: No Pain: Pain Assessment Pain Assessment: No/denies pain  See Function Navigator for Current Functional Status.   Therapy/Group: Individual Therapy  Raylene Everts [facility] 12/13/2014, 8:39 PM

## 2014-12-13 NOTE — Progress Notes (Signed)
Recreational Therapy Session Note  Patient Details  Name: William Schake MRN: [id number] Date of Birth: [birth year] Today's Date: 12/13/2014  Pain: no c/o Skilled Therapeutic Interventions/Progress Updates: Pt seen during co-treat with OT with focus on activity tolerance, dynamic sitting balance, UE use, slide board transfers.  Pt sat EOM tossing & catching a ball using BUE's with min assist.   Therapy/Group: Co-Treatment   Shammara Jarrett 12/13/2014, 12:19 PM

## 2014-12-13 NOTE — Progress Notes (Signed)
[facility] PHYSICAL MEDICINE & REHABILITATION     PROGRESS NOTE    Subjective/Complaints: Feeling well. Had a pretty good day yesterday.  ROS: Pt denies  , rash/itching, headache, blurred or double vision, nausea, vomiting, abdominal pain, diarrhea, chest pain, shortness of breath, palpitations, dysuria, dizziness,  back pain, bleeding, anxiety, or depression    Objective: Vital Signs: Blood pressure 101/53, pulse 63, temperature 98.1 F (36.7 C), temperature source Oral, resp. rate 18, height 5\' 8"  (1.727 m), weight 69.99 kg (154 lb 4.8 oz), SpO2 96 %. No results found. No results for input(s): WBC, HGB, HCT, PLT in the last 72 hours. No results for input(s): NA, K, CL, GLUCOSE, BUN, CREATININE, CALCIUM in the last 72 hours.  Invalid input(s): CO CBG (last 3)  No results for input(s): GLUCAP in the last 72 hours.  Wt Readings from Last 3 Encounters:  12/13/14 69.99 kg (154 lb 4.8 oz)  11/16/14 90.719 kg (200 lb)    Physical Exam:  Constitutional: He is oriented to person, place, and time. He appears well-developed and well-nourished.  HENT:  Head: Normocephalic and atraumatic.    Eyes: Conjunctivae are normal. Pupils are equal, round, and reactive to light.  Neck:  C Collar in place  Cardiovascular: bradycardia and regular rhythm. Respiratory: Effort normal. No respiratory distress. He has no wheezes.  GI: Soft. Bowel sounds +.   Genitourinary: foley out Musculoskeletal: He exhibits no edema.  Neurological: He is alert and oriented to person, place, and time. No cranial nerve deficit. Coordination normal.  Patient has normal sensation proximal to C6 dermatome. Reduced PP/LT from C6 through sacral levels Patient has decreased tone in bilateral Lower extremities 4/5 bilateral deltoids, biceps, 4- triceps, 4 wrist extensors, trace right HI, 1+ to 2- left HI, right HF, KE and ADF/APF are 1 to 2-/5. Tr 1/5 left hip flexors and knee extensors ankle dorsiflexors and plantar  flexors  Skin: Skin is warm and dry.  Cervical incision healed Psych: still quiet but more engaging   Assessment/Plan: 1. 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 Physiatrist is providing close team supervision and 24 hour management of active medical problems listed below. Physiatrist and rehab team continue to assess barriers to discharge/monitor patient progress toward functional and medical goals.  FIM: Function - Bathing Position:  (Nursing performs at night time) Body parts bathed by patient: Right arm, Left arm, Chest, Abdomen Body parts bathed by helper: Back, Left lower leg, Right upper leg, Left upper leg, Right lower leg Assist Level: Assistive device Assistive Device Comment: Hospital bed functions; declined buttock hygiene, RN performed cathing during session and performed perineal hygiene.   Function- Upper Body Dressing/Undressing What is the patient wearing?: Pull over shirt/dress Pull over shirt/dress - Perfomed by patient: Thread/unthread right sleeve, Thread/unthread left sleeve, Put head through opening Pull over shirt/dress - Perfomed by helper: Pull shirt over trunk Assist Level: Touching or steadying assistance(Pt > 75%) Set up : To obtain clothing/put away Function - Lower Body Dressing/Undressing What is the patient wearing?: Pants, Shoes, Ted Hose (and ace wraps) Position: Bed Pants- Performed by helper: Thread/unthread right pants leg, Thread/unthread left pants leg, Pull pants up/down Non-skid slipper socks- Performed by helper: Don/doff right sock, Don/doff left sock Socks - Performed by patient: Don/doff right sock, Don/doff left sock Socks - Performed by helper: Don/doff right sock, Don/doff left sock Shoes - Performed by patient: Don/doff right shoe,  Don/doff left shoe Shoes - Performed by helper: Don/doff right shoe, Don/doff left shoe, Fasten  right, Fasten left TED Hose - Performed by helper: Don/doff right TED hose, Don/doff left TED hose Assist Level: Touching or steadying assistance (Pt > 75%)  Function - Toileting Toileting activity did not occur: No continent bowel/bladder event Toileting steps completed by helper: Adjust clothing prior to toileting, Performs perineal hygiene, Adjust clothing after toileting Toileting Assistive Devices: Grab bar or rail Assist level: Two helpers  Function - Archivist transfer activity did not occur: Safety/medical concerns Toilet transfer assistive device: Drop arm commode Assist level to toilet: Maximal assist (Pt 25 - 49%/lift and lower) Assist level from toilet: Maximal assist (Pt 25 - 49%/lift and lower)  Function - Chair/bed transfer Chair/bed transfer method: Lateral scoot Chair/bed transfer assist level: Touching or steadying assistance (Pt > 75%) Chair/bed transfer assistive device: Armrests, Sliding board, Orthosis Chair/bed transfer details: Manual facilitation for weight shifting, Verbal cues for technique, Verbal cues for safe use of DME/AE  Function - Locomotion: Wheelchair Will patient use wheelchair at discharge?: Yes Type: Manual Max wheelchair distance: 120 Assist Level: Supervision or verbal cues Assist Level: Touching or steadying assistance (Pt > 75%) Assist Level: Touching or steadying assistance (Pt > 75%) (outdoors on unlevel surfaces, including inclines/decline) Function - Locomotion: Ambulation Ambulation activity did not occur: Safety/medical concerns Walk 10 feet activity did not occur: Safety/medical concerns Walk 50 feet with 2 turns activity did not occur: Safety/medical concerns Walk 150 feet activity did not occur: Safety/medical concerns Walk 10 feet on uneven surfaces activity did not occur: Safety/medical concerns  Function - Comprehension Comprehension: Auditory Comprehension assist level: Follows complex conversation/direction  with no assist  Function - Expression Expression: Verbal Expression assist level: Expresses complex ideas: With no assist  Function - Social Interaction Social Interaction assist level: Interacts appropriately with others - No medications needed.  Function - Problem Solving Problem solving assist level: Solves complex problems: Recognizes & self-corrects  Function - Memory Memory assist level: Recognizes or recalls 90% of the time/requires cueing < 10% of the time Patient normally able to recall (first 3 days only): Location of own room, Staff names and faces, That he or she is in a hospital, Current season Medical Problem List and Plan: 1. Functional deficits secondary to C6 ASIA C incomplete tetraplegia, spinal cord injury due to fall with C5-C6 fracture and neurogenic bowel and bladder  -family ed progressing 2. DVT Prophylaxis/Anticoagulation: Pharmaceutical: Lovenox to continue  -dopplers negative 3. Pain Management: Continue Lyrica BID for neuropathic pain.tramdol for mod and oxy for severe pain  -  sports cream for forearm prn---this as improved  -pain controlled fairly well at present 4. Mood: Team to provide ego support.     - more up beat 5. Neuropsych: This patient is capable of making decisions on his own behalf. 6. Skin/Wound Care: Routine pressure relief measures.  7. Fluids/Electrolytes/Nutrition: 100% meals. Encouraging fluids 8. ZOX:[id number] negatie. afebrile  9. 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  -encourage fluids  -continue florinef to better sustain BP's---they looked better yesterday  -limit pain meds (as possible)  -continue prn antivert for vestibular component of dizziness, treatment limited by collar 10. Acutre renal failure---volume depletion--resolved     11. Neurogenic bladder---I/O caths-wife has done a  few---continue to educate  -continue to keep volumes 300-500cc 12. Neurogenic bowel: senna in pm and fleet enema qam with  m uch improved results   LOS (Days) 23 A FACE TO FACE EVALUATION WAS PERFORMED  Sharonica Kraszewski T 12/13/2014 8:07 AM

## 2014-12-14 ENCOUNTER — Inpatient Hospital Stay (HOSPITAL_COMMUNITY): Payer: Federal, State, Local not specified - PPO

## 2014-12-14 ENCOUNTER — Inpatient Hospital Stay (HOSPITAL_COMMUNITY): Payer: Federal, State, Local not specified - PPO | Admitting: Occupational Therapy

## 2014-12-14 MED ORDER — ENOXAPARIN (LOVENOX) PATIENT EDUCATION KIT
PACK | Freq: Once | Status: AC
  Administered 2014-12-14: 13:00:00
  Filled 2014-12-14: qty 1

## 2014-12-14 NOTE — Progress Notes (Addendum)
Patient continues to have poor fluid intake during the day with concentrated urine about  200- 300 cc during the day.  Did push fluids last pm with 600 cc cath at 3 pm. Will set qid caths at 6/noon/ 6pm/ 11 pm.  Monitor urine for now. Will check labs in am to monitor hydration as well as H/H/WBC. Educate wife on lovenox injections.

## 2014-12-14 NOTE — Progress Notes (Signed)
Physical Therapy Session Note  Patient Details  Name: Carl [name] MRN: [id number] Date of Birth: 08/29/1950  Today's Date: 12/14/2014 PT Individual Time: 1400-1500 PT Individual Time Calculation (min): 60 min   Short Term Goals: Week 4:  PT Short Term Goal 1 (Week 4): = LTGs with focus on Fam Ed for d/c 9/13  Skilled Therapeutic Interventions/Progress Updates:   Discussed follow up in regards to w/c eval completed yesterday and plan for continued family education tomorrow and this weekend (especially real car transfer) once loaner chair arrives. Pt verbalized understanding and denies concerns in regards to going home. Wife was not present. Attempted to reinforce energy conservation and home-set up to maximize independence.   Neuro re-ed for sit to stands using [employer] Plus lift with manual faciliation at hips for hip extension and bringing pelvis forward as well as verbal cues for upright posture. Denies symptoms of orthostasis with multiple attempts but only able to maintain ~ 1 min a times. In sitting focused on scapular retraction exercises to promote carryover for posture.  Transfer training with slideboard and without slideboard (on level surface) with overall min A and cues for technique with focus on clearing bottom and directing caregiver. End of session returned back to bed and able to go into supine with mod A to manage BLE. All needs in reach.  Therapy Documentation Precautions:  Precautions Precautions: Cervical, Fall Precaution Comments: Using BLE ACE wraps for BP control at this time. Monitor this Required Braces or Orthoses: Cervical Brace Cervical Brace: Hard collar, At all times Restrictions Weight Bearing Restrictions: No  Pain:  c/o generalized shoulder pain - repositioned and rest breaks as needed.   See Function Navigator for Current Functional Status.   Therapy/Group: Individual Therapy  Karolee Stamps Darrol Poke, PT, DPT  12/14/2014, 3:46 PM

## 2014-12-14 NOTE — Progress Notes (Signed)
Occupational Therapy Session Note  Patient Details  Name: Carl [name] MRN: [id number] Date of Birth: [birth year]  Today's Date: 12/14/2014 OT Individual Time: 1300-1400 OT Individual Time Calculation (min): 60 min    Short Term Goals: Week 3:  OT Short Term Goal 1 (Week 3): Pt will thread B LEs into pants with set-up assist in circle sit position OT Short Term Goal 1 - Progress (Week 3): Not progressing (changed positioning of threading pants - needs A) OT Short Term Goal 2 (Week 3): Pt will complete functional transfers with min A. OT Short Term Goal 2 - Progress (Week 3): Met OT Short Term Goal 3 (Week 3): Pt will direct caregiver with sliding board transfer with no VCs. OT Short Term Goal 3 - Progress (Week 3): Met OT Short Term Goal 4 (Week 3): Pt will bathe LB with min A following set-up of circle sit position.  OT Short Term Goal 4 - Progress (Week 3):  (d/c goal)  Skilled Therapeutic Interventions/Progress Updates:    Treatment session with focus on trunk control, sitting balance, and use of leg loops to assist with mobility and self-care tasks.  Pt instructed therapist in donning leg loops as positioning of slide board for transfers to promote carryover of education/instruction.  Provided pt with handout for UE exercises to increase strength and functional use with pt able to return demonstrate exercises with stress ball.  Contact guard with sitting balance at EOB to engage in doffing and donning shoes.  Pt unable to maintain sitting balance and cross leg over opposite knee to engage in shoe task.  Educated on AE to assist with donning and fastening shoes with pt reporting his wife can do his shoes for him.  Educated on purpose of therapy and decreasing burden of care.  Slide board transfer bed > w/c with steady assist.  In therapy gym engaged in transfer training with focus on squat pivot transfers with pt able to complete multiple squat/scoot pivots with mod assist to provide tactile  cues for forward weight shift.  Raised mat to eliminate BLE support to challenge trunk control with BLE activity and reaching activity.  Therapy Documentation Precautions:  Precautions Precautions: Cervical, Fall Precaution Comments: Using BLE ACE wraps for BP control at this time. Monitor this Required Braces or Orthoses: Cervical Brace Cervical Brace: Hard collar, At all times Restrictions Weight Bearing Restrictions: No General:   Vital Signs: Therapy Vitals Pulse Rate: (!) 46 BP: 90/62 mmHg Patient Position (if appropriate): Sitting Pain: Pain Assessment Faces Pain Scale: Hurts a little bit Pain Location: Shoulder Pain Orientation: Left Pain Intervention(s): Medication (See eMAR)  See Function Navigator for Current Functional Status.   Therapy/Group: Individual Therapy  [name] 12/14/2014, 2:14 PM

## 2014-12-14 NOTE — Progress Notes (Signed)
Occupational Therapy Weekly Progress Note  Patient Details  Name: Carl [name] MRN: [id number] Date of Birth: [birth year]  Beginning of progress report period: December 06, 2014 End of progress report period: December 14, 2014  Today's Date: 12/14/2014 OT Individual Time: 0900-1000 OT Individual Time Calculation (min): 60 min    Patient has met 2 of 2 short term goals; other two goals changed in problem solving home routine and process with wife.  Pt can perform slide board transfers with supervision with A for placement of board and proper set up. Pt can manage LEs with leg loops or be able to direct caregiver how to assist him in a transfer. Pt now transfers to drop arm BSC with slide board while sitting on bed pad for his bowel program and then has been working on threading pants from a sitting position with reacher. Pt can dress UB with min A and LB with max A with assisting caregiver in process. [name] has been present for many of the sessions and has be performing hands on training- she has assisted with cathing the pt and participating in his bowel program, transfers via slide board. Pt continues to be on track for d/c next week.   Patient continues to demonstrate the following deficits: muscle weakness, decreased cardiorespiratoy endurance, impaired timing and sequencing, unbalanced muscle activation and decreased coordination and decreased standing balance, decreased postural control and decreased balance strategies and therefore will continue to benefit from skilled OT intervention to enhance overall performance with BADL and Reduce care partner burden.  Patient progressing toward long term goals..  Continue plan of care.  OT Short Term Goals Week 3:  OT Short Term Goal 1 (Week 3): Pt will thread B LEs into pants with set-up assist in circle sit position OT Short Term Goal 1 - Progress (Week 3): Not progressing (changed positioning of threading pants - needs A) OT Short Term Goal 2  (Week 3): Pt will complete functional transfers with min A. OT Short Term Goal 2 - Progress (Week 3): Met OT Short Term Goal 3 (Week 3): Pt will direct caregiver with sliding board transfer with no VCs. OT Short Term Goal 3 - Progress (Week 3): Met OT Short Term Goal 4 (Week 3): Pt will bathe LB with min A following set-up of circle sit position.  OT Short Term Goal 4 - Progress (Week 3):  (d/c goal) Week 4:  OT Short Term Goal 1 (Week 4): Complete Family education with wife  OT Short Term Goal 2 (Week 4): LTG=STG  Skilled Therapeutic Interventions/Progress Updates:    1:1 Continued with family education with pt and wife. Focused on LB dressing in sitting position on EOB with use of a reacher. Pt able to thread right Le with more than reasonable amt of time and encouragement. Pt performed lateral leans onto pillows for caregiver to assist with pulling up pants. Pt able to assist with pulling up pants on the right side. Educated wife on how to change neck pads and care for neck under neck brace in supine- able to demonstrate. Continued practice on transfers but performing squat pivots with increasing bottom clearance .   Continues to required abdominal binder without it 70/40 bp.  Therapy Documentation Precautions:  Precautions Precautions: Cervical, Fall Precaution Comments: Using BLE ACE wraps for BP control at this time. Monitor this Required Braces or Orthoses: Cervical Brace Cervical Brace: Hard collar, At all times Restrictions Weight Bearing Restrictions: No Pain: Pain Assessment Faces Pain Scale: Hurts a  little bit Pain Location: Shoulder Pain Orientation: Left Pain Intervention(s): Medication (See eMAR)  See Function Navigator for Current Functional Status.   Therapy/Group: Individual Therapy  Willeen Cass [facility] 12/14/2014, 1:23 PM

## 2014-12-14 NOTE — Progress Notes (Signed)
0445 I/O cath- scan 574 ml, cath 600 ml.  Urine amber with blood tinge, cloudy with sedimentation. Bowel program at 0500. Transfer via abdominal binder, and ace wraps to bilateral lower extremities with slide board transfer to bedside commode.  Stool partially formed with loose and soft medium amount stool. Patient tolerated bowel program with fleets enema. adm

## 2014-12-14 NOTE — Progress Notes (Signed)
[place] PHYSICAL MEDICINE & REHABILITATION     PROGRESS NOTE    Subjective/Complaints: Had some blood in urine/cath this am. No fever.  ROS: Pt denies  , rash/itching, headache, blurred or double vision, nausea, vomiting, abdominal pain, diarrhea, chest pain, shortness of breath, palpitations, dysuria, dizziness,  back pain, bleeding, anxiety, or depression    Objective: Vital Signs: Blood pressure 100/58, pulse 49, temperature 98.5 F (36.9 C), temperature source Oral, resp. rate 18, height 5\' 8"  (1.727 m), weight 69.99 kg (154 lb 4.8 oz), SpO2 96 %. No results found. No results for input(s): WBC, HGB, HCT, PLT in the last 72 hours. No results for input(s): NA, K, CL, GLUCOSE, BUN, CREATININE, CALCIUM in the last 72 hours.  Invalid input(s): CO CBG (last 3)  No results for input(s): GLUCAP in the last 72 hours.  Wt Readings from Last 3 Encounters:  12/13/14 69.99 kg (154 lb 4.8 oz)  11/16/14 90.719 kg (200 lb)    Physical Exam:  Constitutional: He is oriented to person, place, and time. He appears well-developed and well-nourished.  HENT:  Head: Normocephalic and atraumatic.    Eyes: Conjunctivae are normal. Pupils are equal, round, and reactive to light.  Neck:  C Collar in place  Cardiovascular: bradycardia and regular rhythm. Respiratory: Effort normal. No respiratory distress. He has no wheezes.  GI: Soft. Bowel sounds +.   Genitourinary: foley out Musculoskeletal: He exhibits no edema.  Neurological: He is alert and oriented to person, place, and time. No cranial nerve deficit. Coordination normal.  Patient has normal sensation proximal to C6 dermatome. Reduced PP/LT from C6 through sacral levels Patient has decreased tone in bilateral Lower extremities 4/5 bilateral deltoids, biceps, 4- triceps, 4 wrist extensors, trace right HI, 1+ to 2- left HI, right HF, KE and ADF/APF remain 1 to 2-/5. Tr 1/5 left hip flexors and knee extensors ankle dorsiflexors and  plantar flexors  Skin: Skin is warm and dry.  Cervical incision healed Psych: still quiet but more engaging   Assessment/Plan: 1. 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 Physiatrist is providing close team supervision and 24 hour management of active medical problems listed below. Physiatrist and rehab team continue to assess barriers to discharge/monitor patient progress toward functional and medical goals.  FIM: Function - Bathing Position:  (Nursing performs at night time) Body parts bathed by patient: Right arm, Left arm, Chest, Abdomen Body parts bathed by helper: Back, Left lower leg, Right upper leg, Left upper leg, Right lower leg Assist Level: Assistive device Assistive Device Comment: Hospital bed functions; declined buttock hygiene, RN performed cathing during session and performed perineal hygiene.   Function- Upper Body Dressing/Undressing What is the patient wearing?: Pull over shirt/dress Pull over shirt/dress - Perfomed by patient: Thread/unthread right sleeve, Thread/unthread left sleeve, Put head through opening Pull over shirt/dress - Perfomed by helper: Pull shirt over trunk Assist Level: Touching or steadying assistance(Pt > 75%) Set up : To obtain clothing/put away Function - Lower Body Dressing/Undressing What is the patient wearing?: Pants, Shoes, Ted Hose (and ace wraps) Position: Bed Pants- Performed by helper: Thread/unthread right pants leg, Thread/unthread left pants leg, Pull pants up/down Non-skid slipper socks- Performed by helper: Don/doff right sock, Don/doff left sock Socks - Performed by patient: Don/doff right sock, Don/doff left sock Socks - Performed by helper: Don/doff right sock, Don/doff left sock Shoes - Performed by patient: Don/doff right  shoe, Don/doff left shoe Shoes - Performed by helper: Don/doff right shoe, Don/doff left shoe,  Fasten right, Fasten left TED Hose - Performed by helper: Don/doff right TED hose, Don/doff left TED hose Assist Level: Touching or steadying assistance (Pt > 75%)  Function - Toileting Toileting activity did not occur: No continent bowel/bladder event Toileting steps completed by helper: Adjust clothing prior to toileting, Performs perineal hygiene, Adjust clothing after toileting Toileting Assistive Devices: Grab bar or rail Assist level: Two helpers  Function - Archivist transfer activity did not occur: Safety/medical concerns Toilet transfer assistive device: Drop arm commode Assist level to toilet: Maximal assist (Pt 25 - 49%/lift and lower) Assist level from toilet: Maximal assist (Pt 25 - 49%/lift and lower)  Function - Chair/bed transfer Chair/bed transfer method: Lateral scoot Chair/bed transfer assist level: Touching or steadying assistance (Pt > 75%) Chair/bed transfer assistive device: Armrests, Sliding board, Orthosis Chair/bed transfer details: Manual facilitation for weight shifting, Verbal cues for technique, Verbal cues for safe use of DME/AE  Function - Locomotion: Wheelchair Will patient use wheelchair at discharge?: Yes Type: Manual Max wheelchair distance: 120 Assist Level: Supervision or verbal cues Assist Level: Touching or steadying assistance (Pt > 75%) Assist Level: Touching or steadying assistance (Pt > 75%) (outdoors on unlevel surfaces, including inclines/decline) Function - Locomotion: Ambulation Ambulation activity did not occur: Safety/medical concerns Walk 10 feet activity did not occur: Safety/medical concerns Walk 50 feet with 2 turns activity did not occur: Safety/medical concerns Walk 150 feet activity did not occur: Safety/medical concerns Walk 10 feet on uneven surfaces activity did not occur: Safety/medical concerns  Function - Comprehension Comprehension: Auditory Comprehension assist level: Follows basic  conversation/direction with extra time/assistive device  Function - Expression Expression: Verbal Expression assist level: Expresses complex ideas: With no assist  Function - Social Interaction Social Interaction assist level: Interacts appropriately with others - No medications needed.  Function - Problem Solving Problem solving assist level: Solves complex problems: Recognizes & self-corrects  Function - Memory Memory assist level: Recognizes or recalls 90% of the time/requires cueing < 10% of the time Patient normally able to recall (first 3 days only): Location of own room, Staff names and faces, That he or she is in a hospital, Current season Medical Problem List and Plan: 1. Functional deficits secondary to C6 ASIA C incomplete tetraplegia, spinal cord injury due to fall with C5-C6 fracture and neurogenic bowel and bladder  -family ed progressing 2. DVT Prophylaxis/Anticoagulation: Pharmaceutical: Lovenox to continue  -dopplers negative 3. Pain Management: Continue Lyrica BID for neuropathic pain.tramdol for mod and oxy for severe pain  -  sports cream for forearm prn---this as improved  -pain controlled fairly well at present 4. Mood: Team to provide ego support.     - more up beat 5. Neuropsych: This patient is capable of making decisions on his own behalf. 6. Skin/Wound Care: Routine pressure relief measures.  7. Fluids/Electrolytes/Nutrition: 100% meals. Encouraging fluids 8. WJX:[id number] negatie. afebrile  9. 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 Acutre renal failure---volume depletion--resolved     11. Neurogenic bladder---I/O  caths-wife has done a few---continue to educate  -continue to keep volumes 300-500cc  -observe for further hematuria 12. Neurogenic bowel:  senna in pm and fleet enema qam with m uch improved results   LOS (Days) 24 A FACE TO FACE EVALUATION WAS PERFORMED  Carl [name] T 12/14/2014 7:10 AM

## 2014-12-14 NOTE — Progress Notes (Signed)
Physical Therapy Session Note  Patient Details  Name: Carl [name] MRN: [id number] Date of Birth: 25-Aug-1950  Today's Date: 12/14/2014 PT Individual Time: 1005-1105 PT Individual Time Calculation (min): 60 min   Short Term Goals:  Week 4:  PT Short Term Goal 1 (Week 4): = LTGs with focus on Fam Ed for d/c 9/13    Skilled Therapeutic Interventions/Progress Updates:  Family ed with wife for simulated car transfer into 21" seat ht using SB.  She required max cues for sequence of set-up to enter, and mod cues for sequence to exit.  Pt performed with supervision to enter with leg loops, slightly downhill, and min assist to exit with leg loops, slightly uphill. Pt and wife would benefit from actual car transfer before d/c.  They have a [employer].  Sit > stand in // with +2 assist, x 1 x 10 seconds, x 1 x 30 seconds.     w/c> bed SBT performed with wife assisting.  She required mod cues for set up.  PT encouraged pt to be more verbally involved in directing wife.  Pt left in bed awaiting NT for cathing. Therapy Documentation Precautions:  Precautions Precautions: Cervical, Fall Precaution Comments: Using BLE ACE wraps for BP control at this time. Monitor this Required Braces or Orthoses: Cervical Brace Cervical Brace: Hard collar, At all times Restrictions Weight Bearing Restrictions: No   Vital Signs: Therapy Vitals Pulse Rate: (!) 46 BP: 90/62 mmHg Patient Position (if appropriate): Sitting Pain: Pain Assessment Faces Pain Scale: Hurts a little bit Pain Location: Shoulder Pain Orientation: Left Pain Intervention(s): Medication (See eMAR)   Other Treatments:     See Function Navigator for Current Functional Status.   Therapy/Group: Individual Therapy  [name] 12/14/2014, 12:52 PM

## 2014-12-15 ENCOUNTER — Inpatient Hospital Stay (HOSPITAL_COMMUNITY): Payer: Federal, State, Local not specified - PPO | Admitting: Physical Therapy

## 2014-12-15 ENCOUNTER — Inpatient Hospital Stay (HOSPITAL_COMMUNITY): Payer: Federal, State, Local not specified - PPO | Admitting: Occupational Therapy

## 2014-12-15 LAB — BASIC METABOLIC PANEL
ANION GAP: 7 (ref 5–15)
BUN: 10 mg/dL (ref 6–20)
CO2: 32 mmol/L (ref 22–32)
Calcium: 8.7 mg/dL — ABNORMAL LOW (ref 8.9–10.3)
Chloride: 99 mmol/L — ABNORMAL LOW (ref 101–111)
Creatinine, Ser: 0.89 mg/dL (ref 0.61–1.24)
Glucose, Bld: 99 mg/dL (ref 65–99)
POTASSIUM: 3.9 mmol/L (ref 3.5–5.1)
SODIUM: 138 mmol/L (ref 135–145)

## 2014-12-15 LAB — CBC
HEMATOCRIT: 35.2 % — AB (ref 39.0–52.0)
HEMOGLOBIN: 11.4 g/dL — AB (ref 13.0–17.0)
MCH: 28.9 pg (ref 26.0–34.0)
MCHC: 32.4 g/dL (ref 30.0–36.0)
MCV: 89.3 fL (ref 78.0–100.0)
Platelets: 227 10*3/uL (ref 150–400)
RBC: 3.94 MIL/uL — AB (ref 4.22–5.81)
RDW: 12.9 % (ref 11.5–15.5)
WBC: 6.2 10*3/uL (ref 4.0–10.5)

## 2014-12-15 MED ORDER — FLUDROCORTISONE ACETATE 0.1 MG PO TABS
0.1000 mg | ORAL_TABLET | Freq: Once | ORAL | Status: AC
  Administered 2014-12-15: 0.1 mg via ORAL
  Filled 2014-12-15: qty 1

## 2014-12-15 MED ORDER — FLUDROCORTISONE ACETATE 0.1 MG PO TABS
0.1000 mg | ORAL_TABLET | Freq: Once | ORAL | Status: AC
  Administered 2014-12-15: 0.1 mg via ORAL

## 2014-12-15 MED ORDER — FLUDROCORTISONE ACETATE 0.1 MG PO TABS
0.2000 mg | ORAL_TABLET | Freq: Every day | ORAL | Status: DC
  Administered 2014-12-16 – 2014-12-19 (×4): 0.2 mg via ORAL
  Filled 2014-12-15 (×5): qty 2

## 2014-12-15 NOTE — Progress Notes (Signed)
Physical Therapy Session Note  Patient Details  Name: Zafir Schauer MRN: [id number] Date of Birth: 10-14-1950  Today's Date: 12/15/2014 PT Individual Time: 1520-1650 PT Individual Time Calculation (min): 90 min   Short Term Goals: Week 4:  PT Short Term Goal 1 (Week 4): = LTGs with focus on Fam Ed for d/c 9/13  Skilled Therapeutic Interventions/Progress Updates:   Pt received in tilt in space w/c; reports syncopal episode this am when getting OOB without all necessary garments donned for BP management.  Pt denies pain or feeling lightheaded this pm, just fatigued.  Agreeable to therapy.  Loaner power w/c not yet delivered.  Discussed with pt other goals he had for stay on rehab prior to D/C; pt asking if he will be able to sit in his recliner at home.  Discussed safety issues with transfers to/from rocking recliner; advised pt to wait and perform transfer training to/from recliner at home with HHPT to assess safety.  Pt verbalized agreement.  Pt transported to gym in tilt in space w/c total A and performed transfer to/from Nustep with slideboard and min A.  Performed UE and LE strengthening, coordination and endurance training at level 3 resistance x 5 minutes +1 min at level 2 for bilat LE extension strengthening only.  Seated on Nustep seat pt performed dynamic sitting balance, core strength and stabilization and postural control training during alternating LE ball kicks with back unsupported and moderate facilitation from therapist to transition to upright posture and maintain.    Returned to room and transferred w/c > bed squat pivot mod A without slideboard.  Performed sit > supine with leg loops and mod A.  While assisting with doffing of BP garments, ATP present with power w/c.  Wife also present at end of session.  ATP provided wife and pt with education on w/c controls and battery.  ATP also stating he will not be able to provide loaner manual w/c with loaner power w/c.  ATP states he will be  able to come on Tuesday afternoon to pick up and deliver the power w/c to the patient's home.  In order for pt to have w/c at home to transfer into and enter home, will discuss possibility of providing pt with loaner w/c from CIR.  Wife also states that her sister may have a manual w/c they can borrow for community; will continue to discuss with team on Monday.  Wife to be present Monday am to practice actual car transfer from power w/c with PT.  Will also need to assess fit and transfers from manual w/c from sister.  Will also need to teach wife boosting in manual w/c prior to D/C.  Pt left in bed with wife present to assist with dinner.   Therapy Documentation Precautions:  Precautions Precautions: Cervical, Fall Precaution Comments: Using BLE ACE wraps for BP control at this time. Monitor this Required Braces or Orthoses: Cervical Brace Cervical Brace: Hard collar, At all times Restrictions Weight Bearing Restrictions: No Pain:  No c/o pain  See Function Navigator for Current Functional Status.   Therapy/Group: Individual Therapy  Edman Circle Joyce [facility] 12/15/2014, 6:58 PM

## 2014-12-15 NOTE — Progress Notes (Signed)
[place] PHYSICAL MEDICINE & REHABILITATION     PROGRESS NOTE    Subjective/Complaints: Had a syncopal episode this morning attempting to get to [facility]. Felt dizzy/woozy prior to event. Feels a little tired now, but better. VSS  ROS: Pt denies  , rash/itching, headache, blurred or double vision, nausea, vomiting, abdominal pain, diarrhea, chest pain, shortness of breath, palpitations, dysuria, dizziness,  back pain, bleeding, anxiety, or depression    Objective: Vital Signs: Blood pressure 106/61, pulse 42, temperature 98.6 F (37 C), temperature source Oral, resp. rate 16, height  (1.727 m), weight 69.99 kg (154 lb 4.8 oz), SpO2 97 %. No results found.  Recent Labs  12/15/14 0553  WBC 6.2  HGB 11.4*  HCT 35.2*  PLT 227    Recent Labs  12/15/14 0553  NA 138  K 3.9  CL 99*  GLUCOSE 99  BUN 10  CREATININE 0.89  CALCIUM 8.7*   CBG (last 3)  No results for input(s): GLUCAP in the last 72 hours.  Wt Readings from Last 3 Encounters:  12/13/14 69.99 kg (154 lb 4.8 oz)  11/16/14 90.719 kg (200 lb)    Physical Exam:  Constitutional: He is oriented to person, place, and time. He appears well-developed and well-nourished.  HENT:  Head: Normocephalic and atraumatic.    Eyes: Conjunctivae are normal. Pupils are equal, round, and reactive to light.  Neck:  C Collar in place  Cardiovascular: bradycardia and regular rhythm. Respiratory: Effort normal. No respiratory distress. He has no wheezes.  GI: Soft. Bowel sounds +.   Genitourinary: foley out Musculoskeletal: He exhibits no edema.  Neurological: He is alert and oriented to person, place, and time. No cranial nerve deficit. Coordination normal.  Patient has normal sensation proximal to C6 dermatome. Reduced PP/LT from C6 through sacral levels Patient has decreased tone in bilateral Lower extremities 4/5 bilateral deltoids, biceps, 4- triceps, 4 wrist extensors, trace right HI, 1+ to 2- left HI, right HF, KE  and ADF/APF remain 1 to 2-/5. Tr 1/5 left hip flexors and knee extensors ankle dorsiflexors and plantar flexors  Skin: Skin is warm and dry.  Cervical incision healed Psych: still quiet but more engaging   Assessment/Plan: 1. 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 Physiatrist is providing close team supervision and 24 hour management of active medical problems listed below. Physiatrist and rehab team continue to assess barriers to discharge/monitor patient progress toward functional and medical goals.  FIM: Function - Bathing Position:  (Nursing performs at night time) Body parts bathed by patient: Right arm, Left arm, Chest, Abdomen Body parts bathed by helper: Back, Left lower leg, Right upper leg, Left upper leg, Right lower leg Assist Level: Assistive device Assistive Device Comment: Hospital bed functions; declined buttock hygiene, RN performed cathing during session and performed perineal hygiene.   Function- Upper Body Dressing/Undressing What is the patient wearing?: Orthosis, Pull over shirt/dress (abdominal binder) Pull over shirt/dress - Perfomed by patient: Thread/unthread right sleeve, Thread/unthread left sleeve, Put head through opening Pull over shirt/dress - Perfomed by helper: Pull shirt over trunk Orthosis activity level: Performed by helper Assist Level: Touching or steadying assistance(Pt > 75%) Set up : To obtain clothing/put away Function - Lower Body Dressing/Undressing What is the patient wearing?: Pants, Shoes, Ted Hose (ace wraps) Position: Sitting EOB Pants- Performed by helper: Thread/unthread right pants leg, Thread/unthread left pants leg, Pull pants up/down Non-skid  slipper socks- Performed by helper: Don/doff right sock, Don/doff left sock Socks - Performed by patient: Don/doff right sock, Don/doff left sock Socks - Performed by helper:  Don/doff right sock, Don/doff left sock Shoes - Performed by patient: Don/doff right shoe, Don/doff left shoe Shoes - Performed by helper: Don/doff right shoe, Don/doff left shoe, Fasten right, Fasten left TED Hose - Performed by helper: Don/doff right TED hose, Don/doff left TED hose Assist Level: Touching or steadying assistance (Pt > 75%)  Function - Toileting Toileting activity did not occur: No continent bowel/bladder event Toileting steps completed by helper: Adjust clothing prior to toileting, Performs perineal hygiene, Adjust clothing after toileting Toileting Assistive Devices: Grab bar or rail Assist level: Two helpers  Function - Archivist transfer activity did not occur: Safety/medical concerns Toilet transfer assistive device: Drop arm commode Assist level to toilet: Maximal assist (Pt 25 - 49%/lift and lower) Assist level from toilet: Maximal assist (Pt 25 - 49%/lift and lower)  Function - Chair/bed transfer Chair/bed transfer method: Lateral scoot Chair/bed transfer assist level: Moderate assist (Pt 50 - 74%/lift or lower) Chair/bed transfer assistive device: Orthosis Chair/bed transfer details: Manual facilitation for weight shifting, Verbal cues for technique, Verbal cues for safe use of DME/AE  Function - Locomotion: Wheelchair Will patient use wheelchair at discharge?: Yes Type: Manual Max wheelchair distance: 120 Assist Level: Supervision or verbal cues Assist Level: Touching or steadying assistance (Pt > 75%) Assist Level: Touching or steadying assistance (Pt > 75%) (outdoors on unlevel surfaces, including inclines/decline) Function - Locomotion: Ambulation Ambulation activity did not occur: Safety/medical concerns Walk 10 feet activity did not occur: Safety/medical concerns Walk 50 feet with 2 turns activity did not occur: Safety/medical concerns Walk 150 feet activity did not occur: Safety/medical concerns Walk 10 feet on uneven surfaces  activity did not occur: Safety/medical concerns  Function - Comprehension Comprehension: Auditory Comprehension assist level: Follows basic conversation/direction with extra time/assistive device  Function - Expression Expression: Verbal Expression assist level: Expresses complex ideas: With no assist  Function - Social Interaction Social Interaction assist level: Interacts appropriately with others - No medications needed.  Function - Problem Solving Problem solving assist level: Solves complex problems: Recognizes & self-corrects  Function - Memory Memory assist level: Recognizes or recalls 90% of the time/requires cueing < 10% of the time Patient normally able to recall (first 3 days only): Location of own room, Staff names and faces, That he or she is in a hospital, Current season   Medical Problem List and Plan: 1. Functional deficits secondary to C6 ASIA C incomplete tetraplegia, spinal cord injury due to fall with C5-C6 fracture and neurogenic bowel and bladder  -family ed progressing 2. DVT Prophylaxis/Anticoagulation: Pharmaceutical: Lovenox to continue  -dopplers negative 3. Pain Management: Continue Lyrica BID for neuropathic pain.tramdol for mod and oxy for severe pain  -  sports cream for forearm prn---this as improved  -pain controlled fairly well at present 4. Mood: Team to provide ego support.     - more up beat 5. Neuropsych: This patient is capable of making decisions on his own behalf. 6. Skin/Wound Care: Routine pressure relief measures.  7. Fluids/Electrolytes/Nutrition: 100% meals. Encouraging fluids 8. ZDG:[id number] negatie. afebrile  9. 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 All labs personally reviewed and  stable  -  take extra time when getting up.  10. Acutre renal failure---volume depletion--resolved     11. Neurogenic bladder---I/O caths-wife has done a few---continue to educate  -continue to keep volumes 300-500cc  -observe for further hematuria 12. Neurogenic bowel: senna in pm and fleet enema qam with much improved results   LOS (Days) 25 A FACE TO FACE EVALUATION WAS PERFORMED  Karalyn Kadel T 12/15/2014 8:03 AM

## 2014-12-15 NOTE — Progress Notes (Signed)
@   4098 placed pt on BSC via slide board. Pt right arm began to shake. When asked was pt dizzy, he stated  Yes. Pt arm then became limp. When assessing LOC, pt was unresponsive- but eyes remained open with blank stare. Attempted sternal rub but pt still unresponsive. Called Rapid Response and obtained vitals.BP 95/76, HR 43, O2 97 RA, Temp 98.6. Pt became alert during vital check. Was advised by [facility] from Rapid Response to place pt back in bed and to not proceed with am bowel program. BP 106/61, HR 42 on reassessment. Reported to oncoming nurse and MD.

## 2014-12-15 NOTE — Progress Notes (Signed)
Occupational Therapy Session Note  Patient Details  Name: Carl [name] MRN: [id number] Date of Birth: October 01, 1950  Today's Date: 12/15/2014 OT Individual Time: 4098-1191 OT Individual Time Calculation (min): 54 min    Short Term Goals: Week 4:  OT Short Term Goal 1 (Week 4): Complete Family education with wife  OT Short Term Goal 2 (Week 4): LTG=STG  Skilled Therapeutic Interventions/Progress Updates:  Upon entering the room,pt seated in wheelchair with no c/o pain. OT educated pt on energy conservation techniques for self care as well as general principles. Paper handout provided as well. OT and pt discussed an example of how to use energy conservation when going to MD appointments once discharged. Pt actively engaged in conversation as appropriate. OT propelled wheelchair outside to conserve energy this session. OT provided min verbal cues and use of paper handout for pt to demonstrate B hand exercises with use of small bean bag. Pt propelled self 57' with supervision and increased time into room at end of session. He then independently asked OT to tilt chair back for pressure relief and set timer for 2 minutes. After the pressure relief, pt set timer again independently to remind him of next time he must perform pressure relief while in chair. Pt remained seated in wheelchair with call bell and all needed items within reach upon exiting the room.   Therapy Documentation Precautions:  Precautions Precautions: Cervical, Fall Precaution Comments: Using BLE ACE wraps for BP control at this time. Monitor this Required Braces or Orthoses: Cervical Brace Cervical Brace: Hard collar, At all times Restrictions Weight Bearing Restrictions: No General:   Vital Signs: Therapy Vitals BP: (!) 94/57 mmHg Patient Position (if appropriate): Sitting Pain: Pain Assessment Pain Assessment: No/denies pain Pain Score: 0-No pain    See Function Navigator for Current Functional  Status.   Therapy/Group: Individual Therapy  Lowella Grip 12/15/2014, 1:57 PM

## 2014-12-15 NOTE — Progress Notes (Signed)
Physical Therapy Session Note  Patient Details  Name: Carl [name] MRN: [id number] Date of Birth: [birth year]  Today's Date: 12/15/2014 PT Individual Time: 1000-1100 PT Individual Time Calculation (min): 60 min   Short Term Goals: Week 4:  PT Short Term Goal 1 (Week 4): = LTGs with focus on Fam Ed for d/c 9/13  Skilled Therapeutic Interventions/Progress Updates:   Session focused on family training with patient's wife. Loaner power wheelchair not yet delivered. Patient requested to brush teeth prior to leaving room. Wife and patient performed simulated car transfer to sedan height via slide board with supervision overall for transfer. Patient able to direct wife for wheelchair set up (locking/unlocking brakes, arm rests, leg rests) and slide board placement with cues from therapist for tilting wheelchair forward to ensure patient's feet able to reach floor and positioning wheelchair close enough to car and to transfer back to car required cues leave room for patient to bring BLE out of car before placing slide board. Performed slide board transfer to 26" height bed on risers with min A to prevent patient from sliding back down to wheelchair, therapist providing verbal cues for head hips relationship. On regular bed in ADL apartment, patient's wife provided mod A for BLE for sit > supine and required assistance from therapist to cue for sequencing and technique to roll to side and then elevate trunk for supine > sit as patient attempted to perform straight sit up from bed and wife unable to help him come to sitting. Wife reported feeling comfortable with ACE wraps for BLE and declined further practice. When asked if she had any questions/concerns or would like to review anything, she stated, "I guess you all will give me a list of everything I need to know before leaving." Reviewed purpose of family training and reviewed Patient Resource Handbook in patient's room with Checklist page, however patient's  wife did not look at noted page or say anything afterwards. Patient left sitting in wheelchair to return to room.   Therapy Documentation Precautions:  Precautions Precautions: Cervical, Fall Precaution Comments: Using BLE ACE wraps for BP control at this time. Monitor this Required Braces or Orthoses: Cervical Brace Cervical Brace: Hard collar, At all times Restrictions Weight Bearing Restrictions: No Vital Signs: Therapy Vitals BP: (!) 94/57 mmHg Patient Position (if appropriate): Sitting Pain: Pain Assessment Pain Assessment: No/denies pain   See Function Navigator for Current Functional Status.   Therapy/Group: Individual Therapy  Kerney Elbe 12/15/2014, 11:20 AM

## 2014-12-15 NOTE — Progress Notes (Signed)
Occupational Therapy Session Note  Patient Details  Name: Carl [name] MRN: [id number] Date of Birth: 01-Feb-1951  Today's Date: 12/15/2014 OT Individual Time: 9562-1308 OT Individual Time Calculation (min): 75 min    Short Term Goals: Week 4:  OT Short Term Goal 1 (Week 4): Complete Family education with wife  OT Short Term Goal 2 (Week 4): LTG=STG  Skilled Therapeutic Interventions/Progress Updates:    1:1 Pt had a period of unresponsiveness earlier in the am transferring to the [facility] for his bowel program. Pt stable now.  Focus on continued family education with pt and wife.  Since enema was unable to happen this am.  Focused on toilet transfer to drop arm BSC and toileting progress including his full bowel program.  [name] able to demonstrate transfer, providing enema, and clothing management with min VC for sequence.  Pt laterally lean onto bed on his right for hygiene after bowel movement. Wife and pt able to transfer to w/c from [facility] independently,   Therapy Documentation Precautions:  Precautions Precautions: Cervical, Fall Precaution Comments: Using BLE ACE wraps for BP control at this time. Monitor this Required Braces or Orthoses: Cervical Brace Cervical Brace: Hard collar, At all times Restrictions Weight Bearing Restrictions: No Pain: Pain Assessment Pain Assessment: No/denies pain Pain Score: 0-No pain  See Function Navigator for Current Functional Status.   Therapy/Group: Individual Therapy  Roney Mans [facility] 12/15/2014, 1:52 PM

## 2014-12-16 ENCOUNTER — Inpatient Hospital Stay (HOSPITAL_COMMUNITY): Payer: Federal, State, Local not specified - PPO | Admitting: Physical Therapy

## 2014-12-16 DIAGNOSIS — N170 Acute kidney failure with tubular necrosis: Secondary | ICD-10-CM

## 2014-12-16 NOTE — Progress Notes (Signed)
Physical Therapy Session Note  Patient Details  Name: Carl [name] MRN: [id number] Date of Birth: 09-05-1950  Today's Date: 12/16/2014 PT Individual Time: 1135-1205 PT Individual Time Calculation (min): 30 min   Short Term Goals: Week 1:  PT Short Term Goal 1 (Week 1): Pt will be able to verbalize need for pressure relief when OOB in w/c every 30 min PT Short Term Goal 1 - Progress (Week 1): Met PT Short Term Goal 2 (Week 1): Pt will be able to roll with max A of 1 using bed rails PRN PT Short Term Goal 2 - Progress (Week 1): Met PT Short Term Goal 3 (Week 1): Pt will be able to tolerate EOB x 10 min with max A  during functional task PT Short Term Goal 3 - Progress (Week 1): Met PT Short Term Goal 4 (Week 1): Pt will be able to tolerate OOB x 2 hours a day PT Short Term Goal 4 - Progress (Week 1): Met Week 2:  PT Short Term Goal 1 (Week 2): Pt will perform bed mobility rolling and supine <> sit with use of leg loops and max A of one person PT Short Term Goal 1 - Progress (Week 2): Met PT Short Term Goal 2 (Week 2): Pt will perform slideboard transfer bed <> chair on level surface with max A of one person and use of leg loops PT Short Term Goal 2 - Progress (Week 2): Met PT Short Term Goal 3 (Week 2): Pt will trial and perform w/c mobility in manual w/c x 50' with mod A  PT Short Term Goal 3 - Progress (Week 2): Met PT Short Term Goal 4 (Week 2): Pt will tolerate sitting up OOB x 4-6 hours and be independent with requesting pressure relief every 30 minutes PT Short Term Goal 4 - Progress (Week 2): Met PT Short Term Goal 5 (Week 2): Pt will tolerate standing in frame x 5 minutes for WB and core strengthening during dynamic UE activity PT Short Term Goal 5 - Progress (Week 2): Progressing toward goal Week 3:  PT Short Term Goal 1 (Week 3): Pt will perform bed mobility with use of leg loops and mod A PT Short Term Goal 1 - Progress (Week 3): Met PT Short Term Goal 2 (Week 3): Pt will  perform bed <> w/c transfers with consistent mod A (on even surfaces) PT Short Term Goal 2 - Progress (Week 3): Met (with slideboard) PT Short Term Goal 3 (Week 3): Pt will perform w/c mobility x 100' with min A in controlled environment PT Short Term Goal 3 - Progress (Week 3): Met PT Short Term Goal 4 (Week 3): Pt will tolerate dynamic standing balance activities and pre-gait activities with max A  PT Short Term Goal 4 - Progress (Week 3): Not met PT Short Term Goal 5 (Week 3): Pt will maintain sitting balance during dynamic activities with min A PT Short Term Goal 5 - Progress (Week 3): Met Week 4:  PT Short Term Goal 1 (Week 4): = LTGs with focus on Fam Ed for d/c 9/13  Skilled Therapeutic Interventions/Progress Updates:   Pt benefits from power w/c for improved ability to manage tilt independently for pressure relief and orthostasis. Session limited by orthostasis. Tilt control reviewed with pt at this time, but further education on power navigation would be indicated. Pt advised not to propel w/c at this time secondary to not having been educated.  Pt would continue to benefit from skilled  PT services to increase functional mobility.   Therapy Documentation Precautions:  Precautions Precautions: Cervical, Fall Precaution Comments: Using BLE ACE wraps for BP control at this time. Monitor this Required Braces or Orthoses: Cervical Brace Cervical Brace: Hard collar, At all times Restrictions Weight Bearing Restrictions: No Vital Signs: Therapy Vitals Temp: 98 F (36.7 C) Temp Source: Oral Pulse Rate: (!) 46 Resp: 17 BP: (!) 90/52 mmHg Patient Position (if appropriate): Sitting Oxygen Therapy SpO2: 99 % O2 Device: Not Delivered Pain: Pain Assessment Pain Assessment: No/denies pain Mobility:  Mod A bed mobility and Min A transfer with cues for technique, weight shift Other Treatments:  Pt educated on rehab plan, safety in mobility, tilting for pressure relief, tilting for  orthostasis, and power w/c management. Pt performs static unsupported sitting as active rest. Pt performs bed mobility with dual motor tasks within functional context. Pt management focused on tilting performed by patient x3.    See Function Navigator for Current Functional Status.   Therapy/Group: Individual Therapy  Monia Pouch 12/16/2014, 4:13 PM

## 2014-12-16 NOTE — Progress Notes (Signed)
Carl [name] is a 64 y.o. male [birth year] [id number]  Subjective: No new complaints. No new problems. Slept well. Feeling OK.  Objective: Vital signs in last 24 hours: Temp:  [98.2 F (36.8 C)-99.2 F (37.3 C)] 99.2 F (37.3 C) (09/10 0625) Pulse Rate:  [52] 52 (09/10 0625) Resp:  [17-18] 18 (09/10 0625) BP: (87-109)/(46-64) 109/60 mmHg (09/10 0625) SpO2:  [97 %-100 %] 97 % (09/10 0625) Weight change:  Last BM Date: 12/15/14  Intake/Output from previous day: 09/09 0701 - 09/10 0700 In: 960 [P.O.:960] Out: 1384 [Urine:1383; Stool:1] Last cbgs: CBG (last 3)  No results for input(s): GLUCAP in the last 72 hours.   Physical Exam General: No apparent distress   HEENT: not dry Lungs: Normal effort. Lungs clear to auscultation, no crackles or wheezes. Cardiovascular: brady rhythm, no edema Abdomen: S/NT/ND; BS(+) Musculoskeletal:  Unchanged; neck collar Neurological: No new neurological deficits Wounds: N/A    Skin: clear  Aging changes Mental state: Alert, oriented, cooperative    Lab Results: BMET    Component Value Date/Time   NA 138 12/15/2014 0553   K 3.9 12/15/2014 0553   CL 99* 12/15/2014 0553   CO2 32 12/15/2014 0553   GLUCOSE 99 12/15/2014 0553   BUN 10 12/15/2014 0553   CREATININE 0.89 12/15/2014 0553   CALCIUM 8.7* 12/15/2014 0553   GFRNONAA >60 12/15/2014 0553   GFRAA >60 12/15/2014 0553   CBC    Component Value Date/Time   WBC 6.2 12/15/2014 0553   RBC 3.94* 12/15/2014 0553   HGB 11.4* 12/15/2014 0553   HCT 35.2* 12/15/2014 0553   PLT 227 12/15/2014 0553   MCV 89.3 12/15/2014 0553   MCH 28.9 12/15/2014 0553   MCHC 32.4 12/15/2014 0553   RDW 12.9 12/15/2014 0553   LYMPHSABS 0.6* 11/21/2014 0525   MONOABS 1.0 11/21/2014 0525   EOSABS 0.4 11/21/2014 0525   BASOSABS 0.0 11/21/2014 0525    Studies/Results: No results found.  Medications: I have reviewed the patient's current medications.  Assessment/Plan:  1. Functional deficits  secondary to C6 ASIA C incomplete tetraplegia, spinal cord injury due to fall with C5-C6 fracture and neurogenic bowel and bladder -family education - in progress 2. DVT Prophylaxis/Anticoagulation: Pharmaceutical: Lovenox to continue -dopplers negative 3. Pain Management: Continue Lyrica BID for neuropathic pain.tramdol for mod and oxy for severe pain - sports cream for forearm prn---this as improved -pain controlled fairly well at present 4. Mood: Team to provide ego support.   - more up beat 5. Neuropsych: This patient is capable of making decisions on his own behalf. 6. Skin/Wound Care: Routine pressure relief measures.  7. Fluids/Electrolytes/Nutrition: 100% meals. Encouraging fluids 8. WUJ:WJXBJYNW negatie. afebrile 9. CV/bradycardia: abd binder/compression stockings to maintain sitting/upright bp,  - bradycardia due to lost sympathetic tone-improved---baseline HR in 50's -encourage fluids -continue florinef to better sustain BP's---they looked better yesterday -syncopal episode this morning. All labs personally reviewed and stable -take extra time when getting up.  10. Acutre renal failure---volume depletion--resolved    11. Neurogenic bladder---I/O caths-wife has done a few---continue to educate -continue to keep volumes 300-500cc -observe for further hematuria 12. Neurogenic bowel: senna in pm and fleet enema qam with much improved results    Length of stay, days: 26  Sonda Primes , MD 12/16/2014, 9:20 AM

## 2014-12-17 ENCOUNTER — Inpatient Hospital Stay (HOSPITAL_COMMUNITY): Payer: Federal, State, Local not specified - PPO | Admitting: Physical Therapy

## 2014-12-17 DIAGNOSIS — N178 Other acute kidney failure: Secondary | ICD-10-CM

## 2014-12-17 NOTE — Progress Notes (Signed)
Physical Therapy Session Note  Patient Details  Name: Carl [name] MRN: [id number] Date of Birth: 29-Mar-1951  Today's Date: 12/17/2014 PT Individual Time: 7125-2712 PT Individual Time Calculation (min): 45 min   Short Term Goals: Week 1:  PT Short Term Goal 1 (Week 1): Pt will be able to verbalize need for pressure relief when OOB in w/c every 30 min PT Short Term Goal 1 - Progress (Week 1): Met PT Short Term Goal 2 (Week 1): Pt will be able to roll with max A of 1 using bed rails PRN PT Short Term Goal 2 - Progress (Week 1): Met PT Short Term Goal 3 (Week 1): Pt will be able to tolerate EOB x 10 min with max A  during functional task PT Short Term Goal 3 - Progress (Week 1): Met PT Short Term Goal 4 (Week 1): Pt will be able to tolerate OOB x 2 hours a day PT Short Term Goal 4 - Progress (Week 1): Met  Skilled Therapeutic Interventions/Progress Updates:  Pt was seen bedside in the am. Pt rolled R/L with side rails and min A to assist with donning pants and pulling shirt. Donned abd binder prior to edge of bed. Pt transferred supine to edge of bed with bed rail, head of bed elevated and leg loops with S. Pt tolerated edge of bed about 5 minutes with S. Pt transferred edge of bed to power w/c with sliding board, leg loops and min A. Pt drove power w/c around the unit with S and verbal cues. Pt performed slalom/obstacle course with power w/c with S and verbal cues. Pt returned to room. Pt able to Ily demonstrated how to tilt w/c in space. Pt left sitting up with call bell within reach and lap belt in place.   Therapy Documentation Precautions:  Precautions Precautions: Cervical, Fall Precaution Comments: Using BLE ACE wraps for BP control at this time. Monitor this Required Braces or Orthoses: Cervical Brace Cervical Brace: Hard collar, At all times Restrictions Weight Bearing Restrictions: No General:  Pain: No c/o pain.    See Function Navigator for Current Functional  Status.   Therapy/Group: Individual Therapy  Dub Amis 12/17/2014, 12:11 PM

## 2014-12-17 NOTE — Progress Notes (Signed)
Carl [name] is a 64 y.o. male 09-21-1950 [id number]  Subjective: No new complaints. No new problems. Slept well.  Objective: Vital signs in last 24 hours: Temp:  [98 F (36.7 C)-99 F (37.2 C)] 99 F (37.2 C) (09/11 0555) Pulse Rate:  [46-48] 48 (09/11 0555) Resp:  [17] 17 (09/11 0555) BP: (90-125)/(52-67) 125/67 mmHg (09/11 0555) SpO2:  [96 %-99 %] 96 % (09/11 0555) Weight change:  Last BM Date: 12/16/14  Intake/Output from previous day: 09/10 0701 - 09/11 0700 In: 1440 [P.O.:1440] Out: 2250 [Urine:2250] Last cbgs: CBG (last 3)  No results for input(s): GLUCAP in the last 72 hours.   Physical Exam General: No apparent distress   HEENT: not dry; clear Lungs: Normal effort. Lungs clear to auscultation, no crackles or wheezes. Cardiovascular: brady rhythm, no edema Abdomen: S/NT/ND; BS(+) Musculoskeletal:  Unchanged; neck collar Neurological: No new neurological deficits Wounds: N/A    Skin: clear  Aging changes Mental state: Alert, oriented, cooperative    Lab Results: BMET    Component Value Date/Time   NA 138 12/15/2014 0553   K 3.9 12/15/2014 0553   CL 99* 12/15/2014 0553   CO2 32 12/15/2014 0553   GLUCOSE 99 12/15/2014 0553   BUN 10 12/15/2014 0553   CREATININE 0.89 12/15/2014 0553   CALCIUM 8.7* 12/15/2014 0553   GFRNONAA >60 12/15/2014 0553   GFRAA >60 12/15/2014 0553   CBC    Component Value Date/Time   WBC 6.2 12/15/2014 0553   RBC 3.94* 12/15/2014 0553   HGB 11.4* 12/15/2014 0553   HCT 35.2* 12/15/2014 0553   PLT 227 12/15/2014 0553   MCV 89.3 12/15/2014 0553   MCH 28.9 12/15/2014 0553   MCHC 32.4 12/15/2014 0553   RDW 12.9 12/15/2014 0553   LYMPHSABS 0.6* 11/21/2014 0525   MONOABS 1.0 11/21/2014 0525   EOSABS 0.4 11/21/2014 0525   BASOSABS 0.0 11/21/2014 0525    Studies/Results: No results found.  Medications: I have reviewed the patient's current medications.  Assessment/Plan:  1. Functional deficits secondary to C6 ASIA C  incomplete tetraplegia, spinal cord injury due to fall with C5-C6 fracture and neurogenic bowel and bladder -family education - in progress 2. DVT Prophylaxis/Anticoagulation: Pharmaceutical: Lovenox to continue -dopplers negative 3. Pain Management: Continue Lyrica BID for neuropathic pain.tramdol for mod and oxy for severe pain - sports cream for forearm prn---this as improved -pain controlled fairly well at present 4. Mood: Team to provide ego support.   - more up beat 5. Neuropsych: This patient is capable of making decisions on his own behalf. 6. Skin/Wound Care: Routine pressure relief measures.  7. Fluids/Electrolytes/Nutrition: 100% meals. Encouraging fluids 8. WUJ:WJXBJYNW negatie. afebrile 9. CV/bradycardia: abd binder/compression stockings to maintain sitting/upright bp,  - bradycardia due to lost sympathetic tone-improved---baseline HR in 50's -encourage fluids -continue florinef to better sustain BP's---they looked better yesterday -syncopal episode this morning. All labs personally reviewed and stable -take extra time when getting up.  10. Acutre renal failure---volume depletion--resolved    11. Neurogenic bladder---I/O caths-wife has done a few---continue to educate -continue to keep volumes 300-500cc -observe for further hematuria 12. Neurogenic bowel: senna in pm and fleet enema qam with much improved results  Cont current Rx   Length of stay, days: 27  Sonda Primes , MD 12/17/2014, 8:52 AM

## 2014-12-18 ENCOUNTER — Inpatient Hospital Stay (HOSPITAL_COMMUNITY): Payer: Federal, State, Local not specified - PPO

## 2014-12-18 ENCOUNTER — Inpatient Hospital Stay (HOSPITAL_COMMUNITY): Payer: Federal, State, Local not specified - PPO | Admitting: Occupational Therapy

## 2014-12-18 ENCOUNTER — Inpatient Hospital Stay (HOSPITAL_COMMUNITY): Payer: Federal, State, Local not specified - PPO | Admitting: Physical Therapy

## 2014-12-18 NOTE — Progress Notes (Signed)
Occupational Therapy Discharge Summary  Patient Details  Name: Carl [name] MRN: [id number] Date of Birth: Aug 22, 1950  Patient has met 6 of 6 long term goals due to improved activity tolerance, improved balance, postural control, ability to compensate for deficits, functional use of  RIGHT upper and LEFT upper extremity and improved coordination.  Patient to discharge at overall Mod Assist level.  Patient's care partner is independent to provide the necessary physical assistance at discharge.    Pt requires increased assist with bathing/ dressing tasks completed at supine level. Pt voiced desire for therapy to focus on various other tasks and wife would assist with bathing/ dressing. Extensive caregiver education provided regarding proper body mechanics, various technique options/ positioning, and importance of pt participation with ADLs/ IADLs.  Recommendation:  Patient will benefit from ongoing skilled OT services in home health setting to continue to advance functional skills in the area of BADL and Reduce care partner burden.  Equipment: Drop arm BSC  Reasons for discharge: treatment goals met and discharge from hospital  Patient/family agrees with progress made and goals achieved: Yes  OT Discharge Precautions/Restrictions  Precautions Precautions: Cervical;Fall Precaution Comments: Using BLE ACE wraps, TED hose, and abdominal binder for BP control at this time.  Required Braces or Orthoses: Cervical Brace Cervical Brace: Hard collar;At all times Restrictions Weight Bearing Restrictions: No Vision/Perception  Vision- History Baseline Vision/History: Wears glasses Wears Glasses: Reading only Patient Visual Report: No change from baseline  Cognition Overall Cognitive Status: Within Functional Limits for tasks assessed Arousal/Alertness: Awake/alert Orientation Level: Oriented X4 Memory: Appears intact Awareness: Appears intact Problem Solving: Appears  intact Safety/Judgment: Appears intact Sensation Sensation Light Touch: Impaired Detail Light Touch Impaired Details: Impaired RUE;Impaired LUE;Impaired RLE;Impaired LLE Coordination Gross Motor Movements are Fluid and Coordinated: No Fine Motor Movements are Fluid and Coordinated: No 9 Hole Peg Test: R: 30 sec L:2 min 13 seconds Motor  Motor Motor: Abnormal postural alignment and control;Tetraplegia Motor - Skilled Clinical Observations: Incomplete quadriplegia at C5-C6  Trunk/Postural Assessment  Cervical Assessment Cervical Assessment: Exceptions to [facility] (Hard collar at all times) Thoracic Assessment Thoracic Assessment: Exceptions to [facility] (Flexed posture due to decreased core strength/ stability.) Lumbar Assessment Lumbar Assessment: Exceptions to [facility] (Posterior tilt) Postural Control Postural Control: Deficits on evaluation Trunk Control: decreased due to SCI  Balance Balance Balance Assessed: Yes Static Sitting Balance Static Sitting - Level of Assistance: 6: Modified independent (Device/Increase time) Dynamic Sitting Balance Dynamic Sitting - Level of Assistance: 5: Stand by assistance Sitting balance - Comments: Sitting on BSC, completing lateral leans.  Extremity/Trunk Assessment RUE Assessment RUE Assessment: Exceptions to [facility] RUE AROM (degrees) Overall AROM Right Upper Extremity: Within functional limits for tasks performed RUE Strength RUE Overall Strength: Deficits RUE Overall Strength Comments: 4+/5 shoulder, 4+/5 elbow, 4/5 wrist; decreased grip strength LUE Assessment LUE Assessment: Exceptions to WFL LUE AROM (degrees) Overall AROM Left Upper Extremity: Deficits LUE Overall AROM Comments: Able to achieve full ROM in shoulder, elbow and wrist, with increased time able to complete finger opposition LUE Strength LUE Overall Strength: Deficits LUE Overall Strength Comments: 4+/5 shoulder, 4+/5 elbow, 4/5 wrist, decreased grip strenght   See Function  Navigator for Current Functional Status.  Bobby Rumpf, Nikitha Mode C 12/18/2014, 4:39 PM

## 2014-12-18 NOTE — Progress Notes (Signed)
Physical Therapy Session Note  Patient Details  Name: Carl [name] MRN: [id number] Date of Birth: 28-Aug-1950  Today's Date: 12/18/2014 PT Individual Time: 1000-1100 PT Individual Time Calculation (min): 60 min   Short Term Goals: Week 4:  PT Short Term Goal 1 (Week 4): = LTGs with focus on Fam Ed for d/c 9/13  Skilled Therapeutic Interventions/Progress Updates:   Pt used loaner power w/c to drive to/from [employer], including on/off elevator with cues for safe reversing,  door elevator via helper, positioning w/c in room next to bed avoiding obstacles with cues.  Actual car transfer with wife into personal [facility].  Wife had brought cushion and placed it in passenger seat to raise seat ht closer to w/c seat ht.  Wife and pt performed actual car transfer with pt performing with supervision after SB set up.  Pt required assistance forl LEs to be lifted into car with pt assisting using leg straps. Transfer was safe and wife is competent to assist pt with this at d/c.    Wife reported that the manual w/c that her sister owns will fit pt (but she did not measure) but she was unable to lift it to put it into car trunk.  She stated she could have that w/c at her house, outside, so that she can transfer pt from car> manual chair on day of d/c, and get him into house.  Due to pt's low tolerance of upright sitting in manual w/c, he will spend minimal time in it, until loaner w/c arrives.   Pt returned to room and was left in power w/c with all needs within reach, and wife in room. Therapy Documentation Precautions:  Precautions Precautions: Cervical, Fall Precaution Comments: Using BLE ACE wraps for BP control at this time. Monitor this Required Braces or Orthoses: Cervical Brace Cervical Brace: Hard collar, At all times Restrictions Weight Bearing Restrictions: No      See Function Navigator for Current Functional Status.   Therapy/Group: Individual Therapy  Amaree Leeper 12/18/2014,  12:36 PM

## 2014-12-18 NOTE — Progress Notes (Signed)
[place] PHYSICAL MEDICINE & REHABILITATION     PROGRESS NOTE    Subjective/Complaints: Only one dizzy spell over weekend (mild) when doing bowel program. Otherwise good weekend.  ROS: Pt denies  , rash/itching, headache, blurred or double vision, nausea, vomiting, abdominal pain, diarrhea, chest pain, shortness of breath, palpitations, dysuria,    back pain, bleeding, anxiety, or depression    Objective: Vital Signs: Blood pressure 114/63, pulse 58, temperature 98.3 F (36.8 C), temperature source Oral, resp. rate 18, height 5\' 8"  (1.727 m), weight 69.99 kg (154 lb 4.8 oz), SpO2 96 %. No results found. No results for input(s): WBC, HGB, HCT, PLT in the last 72 hours. No results for input(s): NA, K, CL, GLUCOSE, BUN, CREATININE, CALCIUM in the last 72 hours.  Invalid input(s): CO CBG (last 3)  No results for input(s): GLUCAP in the last 72 hours.  Wt Readings from Last 3 Encounters:  12/13/14 69.99 kg (154 lb 4.8 oz)  11/16/14 90.719 kg (200 lb)    Physical Exam:  Constitutional: He is oriented to person, place, and time. He appears well-developed and well-nourished.  HENT:  Head: Normocephalic and atraumatic.    Eyes: Conjunctivae are normal. Pupils are equal, round, and reactive to light.  Neck:  C Collar in place  Cardiovascular: bradycardia and regular rhythm. Respiratory: Effort normal. No respiratory distress. He has no wheezes.  GI: Soft. Bowel sounds +.   Genitourinary: foley out Musculoskeletal: He exhibits no edema.  Neurological: He is alert and oriented to person, place, and time. No cranial nerve deficit. Coordination normal.  Patient has normal sensation proximal to C6 dermatome. Reduced PP/LT from C6 through sacral levels Patient has decreased tone in bilateral Lower extremities 4/5 bilateral deltoids, biceps, 4- triceps, 4 wrist extensors, trace right HI, 1+ to 2- left HI, right HF, KE and ADF/APF remain 1 to 2-/5. Tr 1/5 left hip flexors and knee  extensors ankle dorsiflexors and plantar flexors  Skin: Skin is warm and dry.  Cervical incision healed Psych: still quiet but alert   Assessment/Plan: 1. 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 Physiatrist is providing close team supervision and 24 hour management of active medical problems listed below. Physiatrist and rehab team continue to assess barriers to discharge/monitor patient progress toward functional and medical goals.  FIM: Function - Bathing Position:  (Nursing performs at night time) Body parts bathed by patient: Right arm, Left arm, Chest, Abdomen Body parts bathed by helper: Back, Left lower leg, Right upper leg, Left upper leg, Right lower leg Assist Level: Assistive device Assistive Device Comment: Hospital bed functions; declined buttock hygiene, RN performed cathing during session and performed perineal hygiene.   Function- Upper Body Dressing/Undressing What is the patient wearing?: Orthosis, Pull over shirt/dress Pull over shirt/dress - Perfomed by patient: Thread/unthread right sleeve, Thread/unthread left sleeve, Put head through opening Pull over shirt/dress - Perfomed by helper: Pull shirt over trunk Orthosis activity level: Performed by helper Assist Level: Touching or steadying assistance(Pt > 75%) Set up : To obtain clothing/put away Function - Lower Body Dressing/Undressing What is the patient wearing?: Pants, Shoes, Ted Hose Position:  (sitting on BSC) Pants- Performed by patient: Thread/unthread right pants leg Pants- Performed by helper: Thread/unthread left pants leg, Pull pants up/down Non-skid slipper socks- Performed by helper: Don/doff right sock, Don/doff left sock Socks - Performed by patient: Don/doff right sock, Don/doff left sock Socks -  Performed by helper: Don/doff right sock, Don/doff left sock Shoes - Performed by patient:  Don/doff right shoe, Don/doff left shoe Shoes - Performed by helper: Don/doff right shoe, Don/doff left shoe, Fasten right, Fasten left TED Hose - Performed by helper: Don/doff right TED hose, Don/doff left TED hose Assist Level: Touching or steadying assistance (Pt > 75%)  Function - Toileting Toileting activity did not occur: No continent bowel/bladder event Toileting steps completed by helper: Adjust clothing prior to toileting, Performs perineal hygiene, Adjust clothing after toileting Toileting Assistive Devices: Grab bar or rail Assist level: Touching or steadying assistance (Pt.75%)  Function - Archivist transfer activity did not occur: Safety/medical concerns Toilet transfer assistive device: Drop arm commode, Sliding board Assist level to toilet: Supervision or verbal cues Assist level from toilet: Supervision or verbal cues  Function - Chair/bed transfer Chair/bed transfer method: Lateral scoot Chair/bed transfer assist level: Touching or steadying assistance (Pt > 75%) Chair/bed transfer assistive device: Armrests, Sliding board, Orthosis, Other (leg loops) Chair/bed transfer details: Verbal cues for technique, Verbal cues for safe use of DME/AE, Verbal cues for precautions/safety, Manual facilitation for weight shifting  Function - Locomotion: Wheelchair Will patient use wheelchair at discharge?: Yes Type: Motorized Max wheelchair distance: 500 Assist Level: Supervision or verbal cues Assist Level: Supervision or verbal cues Assist Level: Supervision or verbal cues Function - Locomotion: Ambulation Ambulation activity did not occur: Safety/medical concerns Walk 10 feet activity did not occur: Safety/medical concerns Walk 50 feet with 2 turns activity did not occur: Safety/medical concerns Walk 150 feet activity did not occur: Safety/medical concerns Walk 10 feet on uneven surfaces activity did not occur: Safety/medical concerns  Function -  Comprehension Comprehension: Auditory Comprehension assist level: Follows complex conversation/direction with extra time/assistive device  Function - Expression Expression: Verbal Expression assist level: Expresses complex ideas: With extra time/assistive device  Function - Social Interaction Social Interaction assist level: Interacts appropriately with others - No medications needed.  Function - Problem Solving Problem solving assist level: Solves complex problems: With extra time  Function - Memory Memory assist level: Recognizes or recalls 90% of the time/requires cueing < 10% of the time Patient normally able to recall (first 3 days only): Location of own room, Staff names and faces, That he or she is in a hospital, Current season   Medical Problem List and Plan: 1. Functional deficits secondary to C6 ASIA C incomplete tetraplegia, spinal cord injury due to fall with C5-C6 fracture and neurogenic bowel and bladder  -family ed progressing 2. DVT Prophylaxis/Anticoagulation: Pharmaceutical: Lovenox to continue  -dopplers negative 3. Pain Management: Continue Lyrica BID for neuropathic pain.tramdol for mod and oxy for severe pain  -  sports cream for forearm prn---this as improved  -pain controlled fairly well at present 4. Mood: Team to provide ego support.     - more up beat 5. Neuropsych: This patient is capable of making decisions on his own behalf. 6. Skin/Wound Care: Routine pressure relief measures.  7. Fluids/Electrolytes/Nutrition: 100% meals. Encouraging fluids 8. ZOX:WRUEAVWU negatie. afebrile  9. CV/bradycardia: abd binder/compression stockings to maintain sitting/upright bp,    -  bradycardia due to lost sympathetic tone-improved---baseline HR in 50's  -encouraging fluids  -florinef at 0.2mg  for bp control  -syncopal episode this morning. All labs personally reviewed and stable  -take extra time when getting up.  10. Acutre renal failure---volume  depletion--resolved     11. Neurogenic bladder---I/O caths- -continue to educate  -continue to keep volumes 300-500cc  12. Neurogenic bowel: senna in pm and fleet enema qam with much improved results   LOS (Days) 28 A FACE TO FACE EVALUATION WAS PERFORMED  Kiara Keep T 12/18/2014 7:55 AM

## 2014-12-18 NOTE — Progress Notes (Addendum)
Occupational Therapy Session Note  Patient Details  Name: Carl [name] MRN: [id number] Date of Birth: December 19, 1950  Today's Date: 12/18/2014 OT Individual Time: 0830-1000 OT Individual Time Calculation (min): 90 min    Short Term Goals: Week 4:  OT Short Term Goal 1 (Week 4): Complete Family education with wife  OT Short Term Goal 2 (Week 4): LTG=STG  Skilled Therapeutic Interventions/Progress Updates:    Pt seen for OT session focusing on ADL re-training and family education. Pt in supine upon arrival, agreeable to tx session. Pt dressed supine in bed, see Function Navigator for details. Extensive education provided throughout session to pt and pt's wife/ caregiver regarding d/c planning, energy conservation, modified bathing/dressing positions, importance of proper body mechanics for caregiver, bed mobility, and HHOT. Pt reports that he will transfer to [facility] using chuck pad and return to bed following bowel program in order to dress LB. Educated regarding use of lateral leans and importance of energy conservation/ managing number of transfers throughout the day. [name] assisted with all functional transfers including placing of sliding board and set-up of equipment. Pt demonstrated ability to direct care appropriately. He completed transferred supine>EOB>BSC> power w/c with min A for placement of sliding board. He completed grooming tasks mod I at the sink, demonstrating increased fine motor coordination and strength to complete tasks.  He completed 9 hole peg test, see below for results.  Pt left in therapy gym at end of session with hand off to PT.  9 Hole peg test: R: 30 sec L: 2 min 13 sec  Therapy Documentation Precautions:  Precautions Precautions: Cervical, Fall Precaution Comments: Using BLE ACE wraps for BP control at this time. Monitor this Required Braces or Orthoses: Cervical Brace Cervical Brace: Hard collar, At all times Restrictions Weight Bearing Restrictions:  No Pain: No/ Denies pain  See Function Navigator for Current Functional Status.   Therapy/Group: Individual Therapy  Lewis, Cailan Antonucci C 12/18/2014, 7:13 AM

## 2014-12-18 NOTE — Progress Notes (Signed)
Physical Therapy Discharge Summary  Patient Details  Name: Carl [name] MRN: [id number] Date of Birth: December 04, 1950  Today's Date: 12/19/2014 PT Individual Time: 1430-1600 PT Individual Time Calculation (min): 90 min    Patient has met 7 of 7 long term goals due to improved activity tolerance, improved balance, improved postural control, increased strength, ability to compensate for deficits, functional use of  right upper extremity, right lower extremity, left upper extremity and left lower extremity and improved coordination.  Patient to discharge at a wheelchair level Mod I in power w/c but requires supervision-min A for slideboard transfers; pt to use loaner manual w/c in the community until able to purchase lift for power w/c transportation..   Patient's care partner is independent to provide the necessary physical assistance at discharge.  Reasons goals not met: All goals met  Recommendation:  Patient will benefit from ongoing skilled PT services in home health setting to continue to advance safe functional mobility, address ongoing impairments in UE and LE weakness, impaired coordination and motor control, impaired postural control, balance, gait when appropriate, and minimize fall risk.  Equipment: power w/c, loaner manual w/c, slideboard, hospital bed, leg loops  Reasons for discharge: treatment goals met and discharge from hospital  Patient/family agrees with progress made and goals achieved: Yes  PT Discharge Pt participated in skilled PT session with focus on w/c seating/positioning for loaner manual w/c for pt and wife to use in community until able to purchase and install w/c lift on car for power w/c transportation in community.  Manual w/c from sister's was too heavy for pt's wife to bring to the hospital.  Pt performed w/c mobility with power w/c and is able to use all w/c settings/positions for pressure relief and BP management Mod I.  Pt performed multiple transfers power  w/c <> mat <> manual w/c with power w/c cushion and ELR with slideboard and supervision.  Pt performed transfer training manual w/c <> car simulator set to pt's car height with slideboard and min A.  Also educated pt on and demonstrated to pt how to perform boosting in manual w/c.  Will educate wife on manual w/c parts management and boosting tomorrow prior to D/C.  Pt returned to power w/c and to room to rest before dinner.  Pt left with all items within reach.  12/19/14: Wife present in am; reviewed manual w/c parts management, break down and set up for transfers and storage of w/c.  Also reviewed how to perform boosting in manual w/c with wife return demonstrating.  No further questions or concerns from pt or wife.  Power w/c to be delivered to pt this pm by [employer].  Precautions/Restrictions Precautions Precautions: Cervical;Fall Precaution Comments: Using BLE ACE wraps, TED hose, and abdominal binder for BP control at this time.  Required Braces or Orthoses: Cervical Brace Cervical Brace: Hard collar;At all times Restrictions Weight Bearing Restrictions: No Vital Signs Therapy Vitals Temp: 97.8 F (36.6 C) Temp Source: Oral Pulse Rate: (!) 44 Resp: 16 BP: (!) 86/54 mmHg (RN notified) Patient Position (if appropriate): Sitting Oxygen Therapy SpO2: 97 % O2 Device: Not Delivered Pain Pain Assessment Pain Assessment: No/denies pain Pain Score: 0-No pain Cognition Overall Cognitive Status: Within Functional Limits for tasks assessed Arousal/Alertness: Awake/alert Orientation Level: Oriented X4 Memory: Appears intact Awareness: Appears intact Problem Solving: Appears intact Safety/Judgment: Appears intact Sensation Sensation Light Touch: Impaired Detail Light Touch Impaired Details: Impaired RUE;Impaired LUE;Impaired RLE;Impaired LLE Coordination Gross Motor Movements are Fluid and Coordinated: No Fine Motor  Movements are Fluid and Coordinated: No 9 Hole Peg Test: R: 30 sec L:2  min 13 seconds Motor  Motor Motor: Abnormal postural alignment and control;Tetraplegia;Abnormal tone Motor - Skilled Clinical Observations: Incomplete quadriplegia at C5-C6  Trunk/Postural Assessment  Cervical Assessment Cervical Assessment: Exceptions to [facility] (in c-collar) Thoracic Assessment Thoracic Assessment: Exceptions to [facility] (kyphotic; flexible) Lumbar Assessment Lumbar Assessment: Exceptions to [facility] (posterior tilt, flexible) Postural Control Postural Control: Deficits on evaluation Trunk Control: decreased due to SCI Protective Responses: delayed; increased use of UE   Balance Balance Balance Assessed: Yes Static Sitting Balance Static Sitting - Level of Assistance: 6: Modified independent (Device/Increase time) Dynamic Sitting Balance Dynamic Sitting - Level of Assistance: 5: Stand by assistance Sitting balance - Comments: Sitting on BSC, completing lateral leans.  Extremity Assessment  RUE Assessment RUE Assessment: Exceptions to [facility] RUE AROM (degrees) Overall AROM Right Upper Extremity: Within functional limits for tasks performed RUE Strength RUE Overall Strength: Deficits RUE Overall Strength Comments: 4+/5 shoulder, 4+/5 elbow, 4/5 wrist; decreased grip strength LUE Assessment LUE Assessment: Exceptions to WFL LUE AROM (degrees) Overall AROM Left Upper Extremity: Deficits LUE Overall AROM Comments: Able to achieve full ROM in shoulder, elbow and wrist, with increased time able to complete finger opposition LUE Strength LUE Overall Strength: Deficits LUE Overall Strength Comments: 4+/5 shoulder, 4+/5 elbow, 4/5 wrist, decreased grip strenght RLE Strength RLE Overall Strength Comments: 3/5 overall LLE Strength LLE Overall Strength Comments: 2/5 overall   See Function Navigator for Current Functional Status.  Carl [name] 12/18/2014, 5:11 PM

## 2014-12-19 MED ORDER — PANTOPRAZOLE SODIUM 40 MG PO TBEC: 40.0000 mg | DELAYED_RELEASE_TABLET | Freq: Every day | ORAL | Status: DC

## 2014-12-19 MED ORDER — PREGABALIN 50 MG PO CAPS: 50.0000 mg | ORAL_CAPSULE | Freq: Two times a day (BID) | ORAL | Status: DC

## 2014-12-19 MED ORDER — TRAMADOL HCL 50 MG PO TABS: 50.0000 mg | ORAL_TABLET | Freq: Four times a day (QID) | ORAL | Status: DC

## 2014-12-19 MED ORDER — FLEET ENEMA 7-19 GM/118ML RE ENEM: 1.0000 | ENEMA | Freq: Every day | RECTAL | Status: DC

## 2014-12-19 MED ORDER — SENNA 8.6 MG PO TABS: 2.0000 | ORAL_TABLET | Freq: Every day | ORAL | Status: AC

## 2014-12-19 MED ORDER — FLUDROCORTISONE ACETATE 0.1 MG PO TABS: 0.2000 mg | ORAL_TABLET | Freq: Every day | ORAL | Status: DC

## 2014-12-19 MED ORDER — ENOXAPARIN SODIUM 40 MG/0.4ML ~~LOC~~ SOLN: 40.0000 mg | SUBCUTANEOUS | Status: DC

## 2014-12-19 NOTE — Progress Notes (Signed)
Wheelchair necessity note  Pt is in need of a powered wheelchair. He suffers from tetraplegia related to a C6 spinal cord injury. He has limited functional use of his bilateral upper extremities as well as limited movement of either leg. He is unable to utilize a cane or walker due to severe limb and truncal weakness as well as sensory loss below his injury level. He is unable to propel a manual chair due to his upper extremity and truncal weakness. He cannot utilize a scooter due to poor trunk control and sitting balance. He IS able to operate a powered wheelchair as outlined by therapy to allow him to be independent at home and in the community. The powered chair will also assist him in completion of his everyday ADL's. He is motivated and competent to use this chair as well.   If you should have further questions, please contact my office.   Ranelle Oyster, MD, [facility] Physical Medicine & Rehabilitation 12/19/2014

## 2014-12-19 NOTE — Discharge Summary (Signed)
Physician Discharge Summary  Patient ID: Carl [name] MRN: [id number] DOB/AGE: Sep 10, 1950 64 y.o.  Admit date: 11/20/2014 Discharge date: 12/19/2014  Discharge Diagnoses:  Principal Problem:   C6 spinal cord injury Active Problems:   C5 vertebral fracture   Quadriplegia   Neurogenic bowel   Neurogenic bladder   Fever of unknown origin   Orthostatic hypotension   Discharged Condition: Stable   Significant Diagnostic Studies: Dg Chest Port 1 View  11/19/2014   CLINICAL DATA:  Fever, fall, spinal cord injury, quadriparesis  EXAM: PORTABLE CHEST - 1 VIEW  COMPARISON:  11/16/2014  FINDINGS: Lungs are essentially clear. No focal consolidation. No pleural effusion or pneumothorax.  The heart is top-normal in size.  Left arm PICC terminates in the mid SVC.  Cervical spine fixation hardware.  IMPRESSION: No evidence of acute cardiopulmonary disease.  Left arm PICC terminates in the mid SVC.   Electronically Signed   By: Charline Bills M.D.   On: 11/19/2014 18:33   Dg Abd Portable 1v  11/20/2014   CLINICAL DATA:  Abdominal pain, constipation  EXAM: PORTABLE ABDOMEN - 1 VIEW  COMPARISON:  CT abdomen pelvis dated 11/09/2014  FINDINGS: No evidence of bowel obstruction.  Normal colonic stool burden.  Degenerative changes the lumbar spine.  IMPRESSION: Unremarkable abdominal radiograph.   Electronically Signed   By: Charline Bills M.D.   On: 11/20/2014 21:47    Labs:  Basic Metabolic Panel:  Recent Labs Lab 12/15/14 0553  NA 138  K 3.9  CL 99*  CO2 32  GLUCOSE 99  BUN 10  CREATININE 0.89  CALCIUM 8.7*    CBC:  Recent Labs Lab 12/15/14 0553  WBC 6.2  HGB 11.4*  HCT 35.2*  MCV 89.3  PLT 227    CBG: No results for input(s): GLUCAP in the last 168 hours.  Brief HPI:   Carl [name] is a 64 y.o. male who fell from the roof while painting his house on 11/09/14 with complaints of neck pain, numbness from chest down, weakness BUE and inability to move BLE.  Patient was  hypotensive at admission with GCS - 15.  Work up revealed comminuted left C5 and C6 fractures with prevertebral and paraspinal edema, extensive cervical strain and cervical cord contusion with swelling extending C5-C6 with intramedullary hematoma, nondisplaced right 9th and 10th ribs and right eyelid laceration.  Right brow lacerations repaired by Dr. Randon Goldsmith and no signs of globe injury noted. He was placed on bedrest with Aspen collar and underwent ACDF with PEEK cage by Dr. Venetia Maxon on 08/09. He has had bradycardia with hypotension due to neurogenic shock and was started on steriods with improvement in symptoms.  Therapy ongoing and patient was showing improvement in tolerance of activity at EOB. CIR was recommended for follow up therapy.    Hospital Course: Carl [name] was admitted to rehab 11/20/2014 for inpatient therapies to consist of PT and OT at least three hours five days a week. Past admission physiatrist, therapy team and rehab RN have worked together to provide customized collaborative inpatient rehab. He was noted to be dehydrated at admission and hydrated with IVF.  He was noted to be febrile due to Klebsiella UTI and was treated with septra X 7 days. He has defervesced with treatment and has been afebrile since.    Blood pressures were monitored on bid basis and abdominal binder as well as BLE compression were used to help with BP support. As this was not fully effective florinef was added and  titrated to help with symptoms.   Lyrica was used for neuropathic pain and ultram was scheduled on qid basis to help with pain control.  BLE dopplers were negative for DVT and he is to continue on SQ lovenox for 12 total weeks of DVT prophylaxis--thorough Nov 3rd.   Neck incision has healed well and is clean, dry and intact.    Po intake has been good.  Bowel program was initiated and has been effective. Foley was discontinued and patient has required in and out catheterization qid to help with urinary  retention.  He has been educated on consistent fluid intake during the day to help maintain hydration as well as consistent bladder volumes.  He has appointment for follow up with Dr. Sherron Monday for neurogenic bladder.   Dr. Jacquelyne Balint was consulted for psychological evaluation and follow up. Patient was friendly, appropriate, reported that he had ample family and friends support and was adjusting well with current situation. He denied any cognitive for emotional difficulties post accident and no further neuropsychological needs indicated.   Endurance and activity tolerance have improved.  Patient has normal sensation to C6 dermatome and decrease sensation from C6 to sacral levels as well as decreased tone BLE.   He is modified independent in power wheelchair and requires min assist with sliding board transfers. He will continue to receive follow up HHPT, HHOT and HHRN by [facility] after discharge.    Rehab course: During patient's stay in rehab weekly team conferences were held to monitor patient's progress, set goals and discuss barriers to discharge.  At admission, patient required total assist with basic self care tasks and +2 total assist with mobility. He has had improvement in activity tolerance, balance, postural control, as well as ability to compensate for deficits. He is has had improvement in functional use RUE/LUE  and RLE/LLE as well as improved coordination.  He is modified independent in power wheelchair and requires supervision to min assist for sliding board transfers. Family education has been done with wife on all aspects of care including wheelchair management, boosting and maintenance  of consistent schedule at home.    Disposition: 01-Home or Self Care  Diet: Soft foods.   Special Instructions: 1. Drink at least 300-400 cc fluids three times a day before supper.  Use ice chips after supper.  Write down fluid intake and urine output.  2. Cath every 5-6 hours to keep bladder  volumes 350-400 cc. 4. Boost every 20 minutes when in chair or bed. 5. Elevate head of bed 30 degrees at all time to help support blood pressure. Stay out of bed during the day.      Medication List    TAKE these medications        aspirin EC 81 MG tablet  Take 81 mg by mouth daily.     enoxaparin 40 MG/0.4ML injection  Commonly known as:  LOVENOX  Inject 0.4 mLs (40 mg total) into the skin daily.     fludrocortisone 0.1 MG tablet  Commonly known as:  FLORINEF  Take 2 tablets (0.2 mg total) by mouth daily.     pantoprazole 40 MG tablet  Commonly known as:  PROTONIX  Take 1 tablet (40 mg total) by mouth daily.     pregabalin 50 MG capsule  Commonly known as:  LYRICA  Take 1 capsule (50 mg total) by mouth 2 (two) times daily.     senna 8.6 MG Tabs tablet  Commonly known as:  [employer]  Take 2 tablets (17.2 mg total) by mouth at bedtime.     sodium phosphate 7-19 GM/118ML Enem  Place 133 mLs (1 enema total) rectally daily at 6 (six) AM.     traMADol 50 MG tablet--Rx # 120 pills   Commonly known as:  ULTRAM  Take 1 tablet (50 mg total) by mouth every 6 (six) hours.        Follow-up Information    Follow up with Ranelle Oyster, MD On 01/09/2015.   Specialty:  Physical Medicine and Rehabilitation   Why:  Be there at  11 am for  11;20 am appointment   Contact information:   [address], [address] [place] [address] [phone]       Follow up with Dorian Heckle, MD. Call today.   Specialty:  Neurosurgery   Why:  for follow up appointment   Contact information:   [address] [place] [address] [phone]       Follow up with Zollie Pee, PA-C On 01/02/2015.   Why:  @ 2:00 pm for post hospital follow up   Contact information:   [facility] [address] [place], [place]  [address] [phone]      Follow up with MACDIARMID,SCOTT A, MD On 01/18/2015.   Specialty:  Urology   Why:  Be there at 2:45 for 3 pm appointment    Contact information:   [address] [place] [address] [phone]       Signed: Jacquelynn Cree 12/19/2014, 5:48 PM

## 2014-12-19 NOTE — Discharge Instructions (Signed)
Inpatient Rehab Discharge Instructions  Carl [name] Discharge date and time:  12/19/14  Activities/Precautions/ Functional Status: Activity:  Needs to sleep with head elevated at least 30 degrees. Ace wrap legs before getting out of bed.  Wear collar at all times. Diet: regular diet Keep a record of foods that may affect bowels as well as fluid intake.  Wound Care: keep wound clean and dry   Functional status:  ___ No restrictions     ___ Walk up steps independently _X__ 24/7 supervision/assistance   ___ Walk up steps with assistance ___ Intermittent supervision/assistance  ___ Bathe/dress independently ___ Walk with walker     _X__ Bathe/dress with assistance ___ Walk Independently    ___ Shower independently ___ Walk with assistance    ___ Shower with assistance _X__ No alcohol     ___ Return to work/school ________    COMMUNITY REFERRALS UPON DISCHARGE:    Home Health:   PT     OT     RN                    Agency:  [facility] Phone: [phone]  Medical Equipment/Items Ordered:  Wheelchair (and cushion) -   [employer]/ [facility] @ [phone]                                                       Hospital bed, commode and transfer board - [facility] @ [phone]    Special Instructions: 1. Drink at least 300-400 cc fluids three times a day before supper.  Use ice chips after supper.  Write down fluid intake and urine output.  2. Cath every 5-6 hours to keep bladder volumes 350-400 cc. 4. Boost every 20 minutes when in chair or bed. 5. Elevate head of bed 30 degrees at all time to help support blood pressure. Stay out of bed during the day.    My questions have been answered and I understand these instructions. I will adhere to these goals and the provided educational materials after my discharge from the hospital.  Patient/Caregiver Signature _______________________________ Date __________  Clinician Signature _______________________________________  Date __________  Please bring this form and your medication list with you to all your follow-up doctor's appointments.

## 2014-12-19 NOTE — Progress Notes (Addendum)
[facility] PHYSICAL MEDICINE & REHABILITATION     PROGRESS NOTE    Subjective/Complaints: Still some dizziness when he gets up for bowel program. Pt feels that it's manageable.  ROS: Pt denies  , rash/itching, headache, blurred or double vision, nausea, vomiting, abdominal pain, diarrhea, chest pain, shortness of breath, palpitations, dysuria,    back pain, bleeding, anxiety, or depression    Objective: Vital Signs: Blood pressure 114/59, pulse 47, temperature 98.7 F (37.1 C), temperature source Oral, resp. rate 18, height  (1.727 m), weight 69.99 kg (154 lb 4.8 oz), SpO2 97 %. No results found. No results for input(s): WBC, HGB, HCT, PLT in the last 72 hours. No results for input(s): NA, K, CL, GLUCOSE, BUN, CREATININE, CALCIUM in the last 72 hours.  Invalid input(s): CO CBG (last 3)  No results for input(s): GLUCAP in the last 72 hours.  Wt Readings from Last 3 Encounters:  12/13/14 69.99 kg (154 lb 4.8 oz)  11/16/14 90.719 kg (200 lb)    Physical Exam:  Constitutional: He is oriented to person, place, and time. He appears well-developed and well-nourished.  HENT:  Head: Normocephalic and atraumatic.    Eyes: Conjunctivae are normal. Pupils are equal, round, and reactive to light.  Neck:  C Collar in place  Cardiovascular: bradycardia and regular rhythm. Respiratory: Effort normal. No respiratory distress. He has no wheezes.  GI: Soft. Bowel sounds +.   Genitourinary: foley out Musculoskeletal: He exhibits no edema.  Neurological: He is alert and oriented to person, place, and time. No cranial nerve deficit. Coordination normal.  Patient has normal sensation proximal to C6 dermatome. Reduced PP/LT from C6 through sacral levels Patient has decreased tone in bilateral Lower extremities 4/5 bilateral deltoids, biceps, 4- triceps, 4 wrist extensors, trace right HI, 1+ to 2- left HI, right HF, KE and ADF/APF remain 1 to 2-/5. Tr 1/5 left hip flexors and knee  extensors ankle dorsiflexors and plantar flexors  Skin: Skin is warm and dry.  Cervical incision healed Psych: still quiet but alert   Assessment/Plan: 1. 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 Physiatrist is providing close team supervision and 24 hour management of active medical problems listed below. Physiatrist and rehab team continue to assess barriers to discharge/monitor patient progress toward functional and medical goals.  FIM: Function - Bathing Bathing activity did not occur:  (Nursing performs at night time) Position:  (Nursing performs at night time) Body parts bathed by patient: Right arm, Left arm, Chest, Abdomen Body parts bathed by helper: Back, Left lower leg, Right upper leg, Left upper leg, Right lower leg Assist Level: Assistive device Assistive Device Comment: Hospital bed functions; declined buttock hygiene, RN performed cathing during session and performed perineal hygiene.   Function- Upper Body Dressing/Undressing What is the patient wearing?: Orthosis, Pull over shirt/dress Pull over shirt/dress - Perfomed by patient: Thread/unthread right sleeve, Thread/unthread left sleeve, Put head through opening Pull over shirt/dress - Perfomed by helper: Pull shirt over trunk Orthosis activity level: Performed by helper Assist Level: Touching or steadying assistance(Pt > 75%) Set up : To obtain clothing/put away Function - Lower Body Dressing/Undressing What is the patient wearing?: Pants, Shoes, Ted Hose Position: Bed Pants- Performed by patient: Thread/unthread right pants leg Pants- Performed by helper: Thread/unthread right pants leg, Thread/unthread left pants leg, Pull pants up/down Non-skid slipper socks- Performed by helper: Don/doff right sock, Don/doff left sock  Socks - Performed by patient: Don/doff right sock, Don/doff left sock Socks -  Performed by helper: Don/doff right sock, Don/doff left sock Shoes - Performed by patient: Don/doff right shoe, Don/doff left shoe Shoes - Performed by helper: Don/doff right shoe, Don/doff left shoe, Fasten right, Fasten left TED Hose - Performed by helper: Don/doff right TED hose, Don/doff left TED hose Assist Level: Touching or steadying assistance (Pt > 75%)  Function - Toileting Toileting activity did not occur: No continent bowel/bladder event Toileting steps completed by helper: Adjust clothing prior to toileting, Performs perineal hygiene, Adjust clothing after toileting Toileting Assistive Devices: Grab bar or rail Assist level: Touching or steadying assistance (Pt.75%)  Function - Archivist transfer activity did not occur: Safety/medical concerns Toilet transfer assistive device: Drop arm commode, Sliding board Assist level to toilet: Touching or steadying assistance (Pt > 75%) Assist level from toilet: Touching or steadying assistance (Pt > 75%)  Function - Chair/bed transfer Chair/bed transfer method: Lateral scoot Chair/bed transfer assist level: Supervision or verbal cues Chair/bed transfer assistive device: Armrests, Sliding board Chair/bed transfer details: Manual facilitation for placement  Function - Locomotion: Wheelchair Will patient use wheelchair at discharge?: Yes Type: Motorized Max wheelchair distance: 500 Assist Level: No help, No cues, assistive device, takes more than reasonable amount of time Assist Level: No help, No cues, assistive device, takes more than reasonable amount of time Assist Level: No help, No cues, assistive device, takes more than reasonable amount of time Turns around,maneuvers to table,bed, and toilet,negotiates 3% grade,maneuvers on rugs and over doorsills: Yes Function - Locomotion: Ambulation Ambulation activity did not occur: Safety/medical concerns Walk 10 feet activity did not occur: Safety/medical concerns Walk 50  feet with 2 turns activity did not occur: Safety/medical concerns Walk 150 feet activity did not occur: Safety/medical concerns Walk 10 feet on uneven surfaces activity did not occur: Safety/medical concerns  Function - Comprehension Comprehension: Auditory Comprehension assist level: Follows complex conversation/direction with extra time/assistive device  Function - Expression Expression: Verbal Expression assist level: Expresses complex ideas: With extra time/assistive device  Function - Social Interaction Social Interaction assist level: Interacts appropriately with others - No medications needed.  Function - Problem Solving Problem solving assist level: Solves complex problems: With extra time  Function - Memory Memory assist level: Recognizes or recalls 90% of the time/requires cueing < 10% of the time Patient normally able to recall (first 3 days only): Location of own room, Staff names and faces, That he or she is in a hospital, Current season   Medical Problem List and Plan: 1. Functional deficits secondary to C6 ASIA C incomplete tetraplegia, spinal cord injury due to fall with C5-C6 fracture and neurogenic bowel and bladder  -home today with hh follow up  -needs surgery, pmr follow up, will also schedule with urology 2. DVT Prophylaxis/Anticoagulation: Pharmaceutical: Lovenox to continuethru 11/1  -dopplers negative 3. Pain Management: Continue Lyrica BID for neuropathic pain.tramdol for mod and oxy for severe pain  -  sports cream for forearm prn---this as improved  -pain controlled fairly well at present 4. Mood: Team to provide ego support.     - more up beat 5. Neuropsych: This patient is capable of making decisions on his own behalf. 6. Skin/Wound Care: Routine pressure relief measures.  7. Fluids/Electrolytes/Nutrition: 100% meals.   8. ZOX:WRUEAVWU negatie. afebrile  9. CV/bradycardia: abd binder/compression stockings to maintain sitting/upright bp,     -bradycardia due to lost sympathetic tone-improved---baseline HR in 50's  -encouraging fluids  -  florinef at 0.2mg  for bp control  -syncopal episode this morning. All labs personally reviewed and stable  -will need to take extra time when getting up for bowel program in the AM..  10. Acutre renal failure---volume depletion--resolved     11. Neurogenic bladder---I/O caths- -wife can perform  -continue to keep volumes 300-500cc  -HH RN to follow up at home    12. Neurogenic bowel: senna in pm and fleet enema qam with much improved results   LOS (Days) 29 A FACE TO FACE EVALUATION WAS PERFORMED  Yovany Clock T 12/19/2014 8:16 AM

## 2014-12-19 NOTE — Progress Notes (Signed)
Social Work  Discharge Note  The overall goal for the admission was met for:   Discharge location: Yes - home with wife to provide 24/7 assistance  Length of Stay: Yes - 29 days  Discharge activity level: Yes - mod independent in power w/c and min assist transfers  Home/community participation: Yes  Services provided included: MD, RD, PT, SW, OT, RN, TR, Pharmacy and Neuropsych  Financial Services: Medicare  Follow-up services arranged: Home Health: RN, PT, OT via [facility], DME: hospital bed, drop arm commode and transfer board all via [facility];  power w/c and cushion via [employer] and Patient/Family has no preference for HH/DME agencies  Comments (or additional information):  Patient/Family verbalized understanding of follow-up arrangements: Yes  Individual responsible for coordination of the follow-up plan: pt  Confirmed correct DME delivered: Carl [name] 12/19/2014    Carl [name]

## 2014-12-19 NOTE — Progress Notes (Signed)
Pt discharged to home with wife at 1020. Discharge instructions given by Marissa Nestle, PA to pt and wife with verbal understanding. RN discussed bowel and bladder management with wife for home care and wife states she has done 2 I&O caths and does not need further education on bowel and bladder regimen. Wife also states she is educated and feels prepared to inject pt with Lovenox injection at home. Marissa Nestle, PA was with RN when discussing need for further education and management of any medical issues and bowel or bladder with wife having no further questions. Belongings with pt and wife.

## 2014-12-21 ENCOUNTER — Telehealth: Payer: Self-pay | Admitting: *Deleted

## 2014-12-21 NOTE — Telephone Encounter (Signed)
Revonda Standard PT [facility] called asking for verbal orders to begin home health physical therapy, asking for 2x a week for 8 weeks.  Verbal orders given per office protocol

## 2015-01-09 ENCOUNTER — Encounter: Payer: Self-pay | Admitting: Physical Medicine & Rehabilitation

## 2015-01-09 ENCOUNTER — Encounter: Payer: Medicare Other | Attending: Physical Medicine & Rehabilitation | Admitting: Physical Medicine & Rehabilitation

## 2015-01-09 VITALS — BP 114/66 | HR 51

## 2015-01-09 DIAGNOSIS — G8929 Other chronic pain: Secondary | ICD-10-CM | POA: Diagnosis not present

## 2015-01-09 DIAGNOSIS — R109 Unspecified abdominal pain: Secondary | ICD-10-CM | POA: Insufficient documentation

## 2015-01-09 DIAGNOSIS — S12400A Unspecified displaced fracture of fifth cervical vertebra, initial encounter for closed fracture: Secondary | ICD-10-CM | POA: Diagnosis not present

## 2015-01-09 DIAGNOSIS — Z7901 Long term (current) use of anticoagulants: Secondary | ICD-10-CM | POA: Insufficient documentation

## 2015-01-09 DIAGNOSIS — R3911 Hesitancy of micturition: Secondary | ICD-10-CM | POA: Insufficient documentation

## 2015-01-09 DIAGNOSIS — X58XXXS Exposure to other specified factors, sequela: Secondary | ICD-10-CM | POA: Insufficient documentation

## 2015-01-09 DIAGNOSIS — S14106S Unspecified injury at C6 level of cervical spinal cord, sequela: Secondary | ICD-10-CM | POA: Diagnosis present

## 2015-01-09 DIAGNOSIS — K592 Neurogenic bowel, not elsewhere classified: Secondary | ICD-10-CM | POA: Diagnosis not present

## 2015-01-09 DIAGNOSIS — N319 Neuromuscular dysfunction of bladder, unspecified: Secondary | ICD-10-CM | POA: Diagnosis not present

## 2015-01-09 DIAGNOSIS — R001 Bradycardia, unspecified: Secondary | ICD-10-CM | POA: Insufficient documentation

## 2015-01-09 DIAGNOSIS — G8250 Quadriplegia, unspecified: Secondary | ICD-10-CM | POA: Insufficient documentation

## 2015-01-09 NOTE — Progress Notes (Signed)
Subjective:    Patient ID: Carl [name], male    DOB: May 26, 1950, 64 y.o.   MRN: [id number]  HPI   Carl [name] is here in regard to his cervical spinal cord injury. He was discharged from rehab last month. He is having persistent tingling and burning in his arms and hands---the pain is mild- moderate, worse when he's not using his hands.  Strength is slow to return. His left side is "slow" to return. His left leg is improving more than the arm.   From a bowel standpoint, he's been regular with his fleet enema in the AM. He is getting cathed 3-4 x per day with volumes varying from 200-600 cc daily. No signs of infection. Skin has been intact.  He sees his surgeon back again in 2 weeks.   Home health PT and OT are working him still. They have been doing some standing recently.        Pain Inventory Average Pain 3 Pain Right Now 3 My pain is dull and tingling  In the last 24 hours, has pain interfered with the following? General activity 4 Relation with others 1 Enjoyment of life 5 What TIME of day is your pain at its worst? morning and night Sleep (in general) Good  Pain is worse with: inactivity Pain improves with: medication Relief from Meds: na  Mobility ability to climb steps?  no do you drive?  no use a wheelchair needs help with transfers  Function employed # of hrs/week . I need assistance with the following:  dressing, bathing, toileting, meal prep, household duties and shopping  Neuro/Psych bladder control problems bowel control problems weakness numbness tremor tingling trouble walking spasms  Prior Studies Any changes since last visit?  no  Physicians involved in your care Any changes since last visit?  no   Family History  Problem Relation Age of Onset  . Heart attack Father   . Alzheimer's disease Mother   . Aneurysm Brother     brain   Social History   Social History  . Marital Status: Married    Spouse Name: N/A  . Number of  Children: N/A  . Years of Education: N/A   Occupational History  . Pastor    Social History Main Topics  . Smoking status: None  . Smokeless tobacco: None  . Alcohol Use: None  . Drug Use: None  . Sexual Activity: Not Asked   Other Topics Concern  . None   Social History Narrative   Past Surgical History  Procedure Laterality Date  . Anterior cervical decomp/discectomy fusion N/A 11/14/2014    Procedure: Cervical Five-Cervical Six  Anterior Cervical Decompression/Diskectomy/Fusion;  Surgeon: Maeola Harman, MD;  Location: [facility];  Service: Neurosurgery;  Laterality: N/A;   Past Medical History  Diagnosis Date  . Bradycardia   . Chronic abdominal pain   . Hesitancy of micturition    BP 114/66 mmHg  Pulse 51  SpO2 94%  Opioid Risk Score:   Fall Risk Score:  `1  Depression screen PHQ 2/9  Depression screen PHQ 2/9 01/09/2015  Decreased Interest 0  Down, Depressed, Hopeless 0  PHQ - 2 Score 0  Altered sleeping 0  Tired, decreased energy 1  Change in appetite 1  Feeling bad or failure about yourself  0  Trouble concentrating 0  Moving slowly or fidgety/restless 0  Suicidal thoughts 0  PHQ-9 Score 2     Review of Systems  Constitutional: Positive for diaphoresis.  Cardiovascular:  Positive for leg swelling.  Gastrointestinal:       Bowel Control Problems  Genitourinary:       Bladder control problems  Musculoskeletal: Positive for gait problem.       Spasms  Neurological: Positive for tremors, weakness and numbness.       Tingling       Objective:   Physical Exam  Constitutional: He is oriented to person, place, and time. He appears well-developed and well-nourished.  HENT:  Head: Normocephalic and atraumatic.  Eyes: Conjunctivae are normal. Pupils are equal, round, and reactive to light.  Neck:  C Collar in place  Cardiovascular: bradycardia and regular rhythm. Respiratory: Effort normal. No respiratory distress. He has no wheezes.  GI:  Soft. Bowel sounds +.  Genitourinary: foley out Musculoskeletal: He exhibits no edema.  Neurological: He is alert and oriented to person, place, and time. No cranial nerve deficit. Coordination normal.  Patient has normal sensation proximal to C6 dermatome. Reduced PP/LT from C6 through sacral levels Patient has decreased tone in bilateral Lower extremities 4/5 bilateral deltoids, biceps, 4-triceps, 4 wrist extensors, 3-4 right HI, 2+ to 3/5 left HI, right HF, KE and ADF/APF remain 3- to 3+/5. 1-2/5 left hip flexors, 2+knee extensors 2 ankle dorsiflexors and plantar flexors  Skin: Skin is warm and dry.  Nail beds ?yellow Psych: still quiet but alert   Assessment/Plan: 1. Functional deficits secondary to C6 ASIA C incomplete tetraplegia, spinal cord injury due to fall with C5-C6 fracture and neurogenic bowel and bladder -home health therapies. Transition to outpt therapies over the next 4-6 weeks 2. DVT Prophylaxis/Anticoagulation: Pharmaceutical: Lovenox to continuethru 11/1 -needs to take daily. 3. Pain Management:  Will stop lyrica.  -can use tramadol prn.  4. Mood: Team to provide ego support.   - more up beat   6. Skin/Wound Care: Routine pressure relief measures.  9. CV/bradycardia:  At baseline.. Florinef for bp mgt   11. Neurogenic bladder---I/O caths-   -continue to keep volumes 300-500cc     12. Neurogenic bowel: senna in pm and fleet enema qam     Thirty minutes of face to face patient care time were spent during this visit. All questions were encouraged and answered. Follow up in a month

## 2015-01-09 NOTE — Patient Instructions (Signed)
LET ME KNOW IF YOU WANT TO START SOMETHING ELSE FOR NERVE PAIN.    CONTINUE TO WORK ON STANDING, IMPROVING YOUR TRUNK CONTROL.

## 2015-01-10 ENCOUNTER — Telehealth: Payer: Self-pay | Admitting: *Deleted

## 2015-01-10 MED ORDER — GABAPENTIN 100 MG PO CAPS: 100.0000 mg | ORAL_CAPSULE | Freq: Three times a day (TID) | ORAL | Status: DC

## 2015-01-10 NOTE — Telephone Encounter (Signed)
Wife notified.

## 2015-01-10 NOTE — Telephone Encounter (Signed)
Dorrance's wife called and said Dr Riley Kill said to let him know if they wanted something else besides lyrica and they do.

## 2015-01-10 NOTE — Telephone Encounter (Signed)
rx for gabapentin written,  tid

## 2015-01-12 ENCOUNTER — Telehealth: Payer: Self-pay | Admitting: Physical Medicine & Rehabilitation

## 2015-01-12 NOTE — Telephone Encounter (Signed)
Gabapentin is making patients hand swell even more.  Is there something else that the patient can try?

## 2015-01-12 NOTE — Telephone Encounter (Signed)
Dr. Swartz patient 

## 2015-01-13 MED ORDER — OXCARBAZEPINE 150 MG PO TABS: 150.0000 mg | ORAL_TABLET | Freq: Two times a day (BID) | ORAL | Status: DC

## 2015-01-13 NOTE — Telephone Encounter (Signed)
May try trileptal  bid #60, 3RF. rx sent

## 2015-01-16 NOTE — Telephone Encounter (Signed)
Carl [name] notified.

## 2015-01-18 ENCOUNTER — Telehealth: Payer: Self-pay | Admitting: Physical Medicine & Rehabilitation

## 2015-01-18 NOTE — Telephone Encounter (Signed)
Left message with Revonda Standardllison that she may add.

## 2015-01-18 NOTE — Telephone Encounter (Signed)
Carl StandardAllison PT [facility] wants to order gait training to her POC.  Please call her at [phone].

## 2015-02-02 DIAGNOSIS — S12400D Unspecified displaced fracture of fifth cervical vertebra, subsequent encounter for fracture with routine healing: Secondary | ICD-10-CM | POA: Diagnosis not present

## 2015-02-02 DIAGNOSIS — S14106S Unspecified injury at C6 level of cervical spinal cord, sequela: Secondary | ICD-10-CM

## 2015-02-02 DIAGNOSIS — S2241XD Multiple fractures of ribs, right side, subsequent encounter for fracture with routine healing: Secondary | ICD-10-CM

## 2015-02-02 DIAGNOSIS — N319 Neuromuscular dysfunction of bladder, unspecified: Secondary | ICD-10-CM

## 2015-02-02 DIAGNOSIS — G8254 Quadriplegia, C5-C7 incomplete: Secondary | ICD-10-CM | POA: Diagnosis not present

## 2015-02-02 DIAGNOSIS — S12500D Unspecified displaced fracture of sixth cervical vertebra, subsequent encounter for fracture with routine healing: Secondary | ICD-10-CM | POA: Diagnosis not present

## 2015-02-13 ENCOUNTER — Telehealth: Payer: Self-pay | Admitting: Physical Medicine & Rehabilitation

## 2015-02-13 NOTE — Telephone Encounter (Signed)
[name] from [facility] called back, I gave her the verbal order per office protocol

## 2015-02-13 NOTE — Telephone Encounter (Signed)
[name] OT [name] needs verbal orders to see patient 2w1 and then discharge to outpatient.  Please call her at [phone].

## 2015-02-13 NOTE — Telephone Encounter (Signed)
LMTCB.. MAH 

## 2015-02-14 ENCOUNTER — Encounter: Payer: Self-pay | Admitting: Physical Medicine & Rehabilitation

## 2015-02-14 ENCOUNTER — Encounter: Payer: Medicare Other | Attending: Physical Medicine & Rehabilitation | Admitting: Physical Medicine & Rehabilitation

## 2015-02-14 VITALS — BP 113/66 | HR 50 | Resp 14

## 2015-02-14 DIAGNOSIS — S14106S Unspecified injury at C6 level of cervical spinal cord, sequela: Secondary | ICD-10-CM | POA: Diagnosis not present

## 2015-02-14 DIAGNOSIS — R001 Bradycardia, unspecified: Secondary | ICD-10-CM | POA: Diagnosis not present

## 2015-02-14 DIAGNOSIS — G8250 Quadriplegia, unspecified: Secondary | ICD-10-CM

## 2015-02-14 DIAGNOSIS — S12400A Unspecified displaced fracture of fifth cervical vertebra, initial encounter for closed fracture: Secondary | ICD-10-CM | POA: Diagnosis not present

## 2015-02-14 DIAGNOSIS — R109 Unspecified abdominal pain: Secondary | ICD-10-CM | POA: Diagnosis not present

## 2015-02-14 DIAGNOSIS — K592 Neurogenic bowel, not elsewhere classified: Secondary | ICD-10-CM | POA: Diagnosis not present

## 2015-02-14 DIAGNOSIS — Z7901 Long term (current) use of anticoagulants: Secondary | ICD-10-CM | POA: Diagnosis not present

## 2015-02-14 DIAGNOSIS — N319 Neuromuscular dysfunction of bladder, unspecified: Secondary | ICD-10-CM

## 2015-02-14 DIAGNOSIS — G8929 Other chronic pain: Secondary | ICD-10-CM | POA: Insufficient documentation

## 2015-02-14 DIAGNOSIS — R3911 Hesitancy of micturition: Secondary | ICD-10-CM | POA: Insufficient documentation

## 2015-02-14 MED ORDER — BACLOFEN 10 MG PO TABS: 5.0000 mg | ORAL_TABLET | Freq: Every evening | ORAL | Status: DC | PRN

## 2015-02-14 MED ORDER — PREGABALIN 50 MG PO CAPS: 50.0000 mg | ORAL_CAPSULE | Freq: Two times a day (BID) | ORAL | Status: DC

## 2015-02-14 NOTE — Patient Instructions (Signed)
PLEASE CALL ME WITH ANY PROBLEMS OR QUESTIONS (#[phone]).  HAVE A GOOD DAY!    

## 2015-02-14 NOTE — Progress Notes (Signed)
Subjective:    Patient ID: Carl [name], male    DOB: 08/05/1950, 64 y.o.   MRN: [id number]  HPI   Carl [name] is here in follow up of his SCI. Because of swelling we changed his lyrica to gabapentin and trileptal. However, he had side effects with these too,  so he changed ultimately back to lyrica  BID which seems to be working better for him. He is now walking with a RW. Therapy has progressed him nicely at home. He states he will address stairs tomorrow.   He is still cathing 4x per day.  Volumes are 100-600 cc on avg. He has UDS pending per urology soon. He is on a daily bowel program.    He has spasms at night typically in the left leg. They last an hour or two until he's re-positioned. Massage helps.    Pain Inventory Average Pain 3 Pain Right Now 2 My pain is dull and tingling  In the last 24 hours, has pain interfered with the following? General activity 4 Relation with others 0 Enjoyment of life 4 What TIME of day is your pain at its worst? night Sleep (in general) Good  Pain is worse with: inactivity and some activites Pain improves with: therapy/exercise and medication Relief from Meds: 5  Mobility walk with assistance use a walker ability to climb steps?  no do you drive?  no use a wheelchair transfers alone  Function retired I need assistance with the following:  dressing, meal prep and household duties  Neuro/Psych bladder control problems bowel control problems weakness numbness tremor tingling trouble walking spasms  Prior Studies Any changes since last visit?  no x-rays  Physicians involved in your care Any changes since last visit?  no   Family History  Problem Relation Age of Onset  . Heart attack Father   . Alzheimer's disease Mother   . Aneurysm Brother     brain   Social History   Social History  . Marital Status: Married    Spouse Name: N/A  . Number of Children: N/A  . Years of Education: N/A   Occupational History    . Pastor    Social History Main Topics  . Smoking status: Former Games developer  . Smokeless tobacco: None  . Alcohol Use: None  . Drug Use: None  . Sexual Activity: Not Asked   Other Topics Concern  . None   Social History Narrative   Past Surgical History  Procedure Laterality Date  . Anterior cervical decomp/discectomy fusion N/A 11/14/2014    Procedure: Cervical Five-Cervical Six  Anterior Cervical Decompression/Diskectomy/Fusion;  Surgeon: Maeola Harman, MD;  Location: [facility];  Service: Neurosurgery;  Laterality: N/A;   Past Medical History  Diagnosis Date  . Bradycardia   . Chronic abdominal pain   . Hesitancy of micturition    BP 113/66 mmHg  Pulse 50  Resp 14  SpO2 96%  Opioid Risk Score:   Fall Risk Score:  `1  Depression screen PHQ 2/9  Depression screen PHQ 2/9 01/09/2015  Decreased Interest 0  Down, Depressed, Hopeless 0  PHQ - 2 Score 0  Altered sleeping 0  Tired, decreased energy 1  Change in appetite 1  Feeling bad or failure about yourself  0  Trouble concentrating 0  Moving slowly or fidgety/restless 0  Suicidal thoughts 0  PHQ-9 Score 2     Review of Systems  Cardiovascular: Positive for leg swelling.  Gastrointestinal: Positive for abdominal pain.  Neurogenic bowel  Genitourinary:       Neurogenic bladder  Musculoskeletal: Positive for gait problem.       Spasms  Neurological: Positive for tremors, weakness and numbness.       Tingling  All other systems reviewed and are negative.      Objective:   Physical Exam  Constitutional: He is oriented to person, place, and time. He appears well-developed and well-nourished.  HENT:  Head: Normocephalic and atraumatic.  Eyes: Conjunctivae are normal. Pupils are equal, round, and reactive to light.  Neck:  C Collar in place  Cardiovascular: bradycardia and regular rhythm.  Respiratory: Effort normal. No respiratory distress. He has no wheezes.  GI: Soft. Bowel sounds +.   Genitourinary: foley out Musculoskeletal: He exhibits no edema.  Neurological: He is alert and oriented to person, place, and time. No cranial nerve deficit. Coordination normal.  Patient has normal sensation proximal to C6 dermatome. Reduced PP/LT from C6 through sacral levels Patient has decreased tone in bilateral Lower extremities 4/5 bilateral deltoids, biceps, 4-triceps, 4 wrist extensors, 4/5 right HI,   3/5 left HI, right HF, KE and ADF/APF   3+ to 4-/5. 3 to 3+/5 left hip flexors, 4/5 knee extensors 3+/4- ankle dorsiflexors and plantar flexors, left weaker than right. Ambulating with walker. Puts a lot of weight on the walker. Drags the left foot with fatigue.  Skin: Skin is warm and dry.  Nail beds ?yellow  Psych: still quiet but alert    Assessment/Plan:  1. Functional deficits secondary to C6 ASIA C incomplete tetraplegia, spinal cord injury due to fall with C5-C6 fracture and neurogenic bowel and bladder   -  Transition to outpt therapies to address higher level balacne, strength, gait, UE use, ADL's 2. For spasms: will use low dose baclofen prn for spasm at hs, 5-10mg  3. Pain Management: continue lyrica 50mg  bid.   - tramadol prn.  4. Mood: improved 5.  Neurogenic bladder---I/O caths-  -continue to keep volumes 300-500cc -urology follow up as scheduled of UDS 6. Neurogenic bowel: senna in pm and fleet enema qam--effective 7. BP: continue florinef until follow up with me next month  Follow up in 6 weeks  Thirty minutes of face to face patient care time were spent during this visit. All questions were encouraged and answered.

## 2015-02-19 ENCOUNTER — Other Ambulatory Visit: Payer: Self-pay | Admitting: Physical Medicine & Rehabilitation

## 2015-02-20 ENCOUNTER — Other Ambulatory Visit: Payer: Self-pay

## 2015-02-20 MED ORDER — FLUDROCORTISONE ACETATE 0.1 MG PO TABS: 0.2000 mg | ORAL_TABLET | Freq: Every day | ORAL | Status: DC

## 2015-02-27 ENCOUNTER — Telehealth: Payer: Self-pay

## 2015-02-27 NOTE — Telephone Encounter (Signed)
Patients wife called to get the status of patients therapy.  She says if it would be easier to send [facility] that would be fine.

## 2015-03-08 ENCOUNTER — Ambulatory Visit: Payer: Medicare Other | Attending: Physical Medicine & Rehabilitation | Admitting: Rehabilitation

## 2015-03-08 ENCOUNTER — Ambulatory Visit: Payer: Medicare Other | Admitting: *Deleted

## 2015-03-08 ENCOUNTER — Encounter: Payer: Self-pay | Admitting: Rehabilitation

## 2015-03-08 DIAGNOSIS — S14106A Unspecified injury at C6 level of cervical spinal cord, initial encounter: Secondary | ICD-10-CM | POA: Diagnosis not present

## 2015-03-08 DIAGNOSIS — R2681 Unsteadiness on feet: Secondary | ICD-10-CM | POA: Diagnosis present

## 2015-03-08 DIAGNOSIS — R531 Weakness: Secondary | ICD-10-CM | POA: Insufficient documentation

## 2015-03-08 DIAGNOSIS — M25532 Pain in left wrist: Secondary | ICD-10-CM | POA: Insufficient documentation

## 2015-03-08 DIAGNOSIS — M6289 Other specified disorders of muscle: Secondary | ICD-10-CM | POA: Insufficient documentation

## 2015-03-08 DIAGNOSIS — R6889 Other general symptoms and signs: Secondary | ICD-10-CM

## 2015-03-08 DIAGNOSIS — R269 Unspecified abnormalities of gait and mobility: Secondary | ICD-10-CM

## 2015-03-08 DIAGNOSIS — M6281 Muscle weakness (generalized): Secondary | ICD-10-CM | POA: Insufficient documentation

## 2015-03-08 DIAGNOSIS — R29898 Other symptoms and signs involving the musculoskeletal system: Secondary | ICD-10-CM | POA: Diagnosis present

## 2015-03-08 DIAGNOSIS — R279 Unspecified lack of coordination: Secondary | ICD-10-CM | POA: Insufficient documentation

## 2015-03-08 DIAGNOSIS — X58XXXA Exposure to other specified factors, initial encounter: Secondary | ICD-10-CM | POA: Insufficient documentation

## 2015-03-08 NOTE — Therapy (Signed)
[facility] [address] [place], [place], [address] Phone: [phone]   Fax:  [phone]  Occupational Therapy Evaluation  Patient Details  Name: Carl [name] MRN: [id number] Date of Birth: 01-31-1951 Referring Provider: Faith Rogue  Encounter Date: 03/08/2015      OT End of Session - 03/08/15 1252    Visit Number 1   Number of Visits 17   Date for OT Re-Evaluation 05/02/14   Authorization Type MCR - G code needed   Authorization - Visit Number 1   Authorization - Number of Visits 10   OT Start Time 1100   OT Stop Time 1155   OT Time Calculation (min) 55 min   Activity Tolerance Patient tolerated treatment well   Behavior During Therapy [facility] for tasks assessed/performed      Past Medical History  Diagnosis Date  . Bradycardia   . Chronic abdominal pain   . Hesitancy of micturition     Past Surgical History  Procedure Laterality Date  . Anterior cervical decomp/discectomy fusion N/A 11/14/2014    Procedure: Cervical Five-Cervical Six  Anterior Cervical Decompression/Diskectomy/Fusion;  Surgeon: Maeola Harman, MD;  Location: [facility];  Service: Neurosurgery;  Laterality: N/A;    There were no vitals filed for this visit.  Visit Diagnosis:  Lack of coordination  Decreased functional activity tolerance  Decreased grip strength of left hand  Decreased strength of trunk and back  Decreased pinch strength  Pain in left wrist      Subjective Assessment - 03/08/15 1111    Subjective  I want to get the strength back in my upper body and get rid of tingling feeling   Patient is accompained by: Family member   Patient Stated Goals increase strength and decrease tingling   Currently in Pain? Yes   Pain Score 5    Pain Location Arm  bilaterally shoulder down to finger tips, left seems worse than right   Pain Orientation Right;Left   Pain Descriptors / Indicators Tingling;Aching;Throbbing   Pain  Type Chronic pain   Pain Radiating Towards down both arms   Pain Onset 1 to 4 weeks ago  pt reports he feels it has been getting worse over the past 2 weeks   Pain Frequency Constant   Aggravating Factors  nothing in particular   Pain Relieving Factors feels that it is due to medications, therefore feels better when meds are wearing off   Effect of Pain on Daily Activities "somethings"...picking up items with left hand   Multiple Pain Sites No           [facility] OT Assessment - 03/08/15 1115    Assessment   Diagnosis C6 quadriplegia   Referring Provider Faith Rogue   Onset Date 11/09/14   Prior Therapy acute, inpatient rehab, HHOT   Precautions   Precautions Fall   Restrictions   Weight Bearing Restrictions No   Balance Screen   Has the patient fallen in the past 6 months No   Home  Environment   Family/patient expects to be discharged to: Private residence   Living Arrangements Spouse/significant other   Type of Home House   Home Access Ramped entrance   Home Layout Able to live on main level with bedroom/bathroom   Bathroom Copywriter, advertising;Door  walk-in shower is upstairs, pt reports he occasionally goes   Air cabin crew - 2 wheels;Bedside commode;Shower seat;Hand held shower head;Wheelchair -  manual;Hospital bed   Lives With Spouse   Prior Function   Level of Independence Independent   Vocation 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   Vocation Requirements Has gone back to preaching, 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   ADL   Eating/Feeding Independent   Grooming Independent   Upper Body Bathing Modified independent   Lower Body Bathing Modified independent   Upper Body Dressing Independent   Lower Body Dressing Increased time;Minimal assistance   Toilet Tranfer Modified independent   Toilet Transer Equipment Regular height toilet;Bedside commode   Toileting -   Research officer, trade union with back   Mobility   Mobility Status Needs assist   Mobility Status Comments mod I using RW   Written Expression   Dominant Hand Right   Handwriting 100% legible   Written Experience Within Functional Limits  right hand   Vision - History   Baseline Vision Wears glasses only for reading   Patient Visual Report --  no change   Vision Assessment   Eye Alignment Within Functional Limits   Activity Tolerance   Activity Tolerance Tolerates 30 min activity with muliple rests   Sitting Balance --  independent   Cognition   Overall Cognitive Status Within Functional Limits for tasks assessed   Sensation   Light Touch Impaired by gross assessment   Light Touch Impaired Details Impaired RUE;Impaired LUE   Hot/Cold Impaired by gross assessment  pt reports extremely hypersensative to cold water "on fire"   Proprioception Appears Intact   Additional Comments pt with hypersensativity to LUE touch, pt reports left pointer finger worse than other fingers.    Coordination   Gross Motor Movements are Fluid and Coordinated No   Fine Motor Movements are Fluid and Coordinated No   Coordination and Movement Description decreased strength and coordination   9 Hole Peg Test Left   Left 9 Hole Peg Test 13 seconds    Box and Blocks left: 38, right: 54   Tremors none noted   Coordination right finger to nose test WNL, left finger to nose test slightly diminished    Perception   Perception Within Functional Limits   Praxis   Praxis Intact   Edema   Edema edeman noted in L hand   ROM / Strength   AROM / PROM / Strength AROM;Strength   AROM   Overall AROM  Deficits   AROM Assessment Site Shoulder;Elbow;Wrist   Right/Left Shoulder Left   Left Shoulder Extension --  WFL   Left Shoulder Flexion --  WFL   Left Shoulder ABduction --  [facility]   Left Shoulder Internal Rotation --   decreased due to stiffness   Left Shoulder External Rotation --  decreased due to stiffness   Right/Left Elbow Left   Left Elbow Flexion --  WFL   Left Elbow Extension --  WFL   Right/Left Wrist Left   Left Wrist Flexion --  decreased   Strength   Overall Strength Deficits   Overall Strength Comments --   Strength Assessment Site Shoulder;Elbow;Wrist   Right/Left Shoulder Left   Left Shoulder Flexion 4/5   Left Shoulder Extension 4/5   Left Shoulder Internal Rotation 3-/5   Left Shoulder External Rotation 3-/5   Right/Left Elbow Left   Left Elbow Flexion 4/5   Left Elbow Extension 3/5   Right/Left Wrist Left   Left Wrist  Flexion 2+/5   Left Wrist Extension 4/5   Hand Function   Right Hand Gross Grasp Functional   Right Hand Grip (lbs) 45   Right Hand Lateral Pinch 12 lbs   Right Hand 3 Point Pinch 12 lbs   Left Hand Gross Grasp Impaired   Left Hand Grip (lbs) 20   Left Hand Lateral Pinch 7 lbs   Left 3 point pinch 7 lbs   Sensation Exercises   Desensitization needs desensitization for hypersensativity   Functional Reaching Activities   High Level WNL, decreased strength                           OT Short Term Goals - 03/08/15 1305    OT SHORT TERM GOAL #1   Title Pt will increase LUE gross grip to 30 lbs to help increase overall functional use and independence of L hand    Baseline 20lbs    Time 4   Period Weeks   Status New   OT SHORT TERM GOAL #2   Title Pt will be educated on trunk/core strengthening exercises to increase overall posture and strength in order to safely perform ADLs   Time 4   Period Weeks   Status New   OT SHORT TERM GOAL #3   Title Pt will be educated on desensitization exercises to decrease hypersensitivity to LUE    Time 4   Period Weeks   Status New   OT SHORT TERM GOAL #4   Title Pt will be independent with self correcting poor tecnique and compensatory strategies to left shoulder when functionally using LUE    Time 4   Period Weeks   Status New           OT Long Term Goals - 03/08/15 1307    OT LONG TERM GOAL #1   Title Pt will increase LUE gross grip to 40 lbs to help increase overall functional use and independence of L hand    Baseline 20lbs at baseline   Time 8   Period Weeks   Status New   OT LONG TERM GOAL #2   Title Pt will be independent with LB ADLs   Baseline min assist    Time 8   Period Weeks   Status New   OT LONG TERM GOAL #3   Title Pt will improve 9-hole peg test time to less than 55 seconds    Baseline 73 seconds   Time 8   Period Weeks   Status New   OT LONG TERM GOAL #4   Title Pt will improve left wrist strength to 4/5 strength    Baseline 2+/5 strength    Time 8   Period Weeks   Status New   OT LONG TERM GOAL #5   Title Pt will improve lateral and three pinch strength by at least 5lbs   Baseline 7lbs for both lateal and three pinch   Time 8   Period Weeks   Status New               Plan - 03/08/15 1301    Clinical Impression Statement Pt is a 64 yo male who was painting a house and was up on the roof. He fell onto a lower roof and then fell again onto the ground. He struck his head during the fall. Afterwards, he was unable to move his legs. He was transported as a level II trauma but upgraded  to level I in transport due to hypotension. On arrival to the hospital he complained of inability to move his legs and that his trunk and arms feel tingly. S/p ACDF C5-C6 11/14/14.   Pt will benefit from skilled therapeutic intervention in order to improve on the following deficits (Retired) Decreased activity tolerance;Decreased balance;Decreased coordination;Decreased endurance;Decreased mobility;Decreased range of motion;Decreased safety awareness;Decreased skin integrity;Decreased strength;Increased edema;Impaired UE functional use;Pain;Improper body mechanics;Increased muscle spasms;Impaired sensation   Rehab Potential Excellent   Clinical Impairments  Affecting Rehab Potential None known at this time   OT Frequency 2x / week   OT Duration 8 weeks   OT Treatment/Interventions Self-care/ADL training;Electrical Stimulation;Moist Heat;Traction;Therapeutic exercise;Neuromuscular education;Energy conservation;Functional Mobility Training;Passive range of motion;Therapeutic exercises;Therapeutic activities;Patient/family education;Balance training   Plan start coordination and strengthening HEP, start desensitization education for LUE   Recommended Other Services none at this time   Consulted and Agree with Plan of Care Patient;Family member/caregiver   Family Member Consulted wife          G-Codes - 03/08/15 1322    Functional Assessment Tool Used 9-hole peg test, box n blocks, clinical judgement    Functional Limitation Carrying, moving and handling objects   Carrying, Moving and Handling Objects Current Status [phone] At least 20 percent but less than 40 percent impaired, limited or restricted   Carrying, Moving and Handling Objects Goal Status ([id number]) At least 1 percent but less than 20 percent impaired, limited or restricted      Problem List Patient Active Problem List   Diagnosis Date Noted  . Orthostatic hypotension 11/30/2014  . C6 spinal cord injury (HCC) 11/20/2014  . Neurogenic bowel 11/20/2014  . Neurogenic bladder 11/20/2014  . Fever of unknown origin   . Quadriplegia (HCC) 11/14/2014  . C5 vertebral fracture (HCC) 11/09/2014    Carl [name] , MS, OTR/L, CLT Pager: [phone]  03/08/2015, 1:29 PM  [facility] [address] [place], [place], [address] Phone: [phone]   Fax:  [phone]  Name: Carl [name] MRN: [id number] Date of Birth: 1951/01/01

## 2015-03-08 NOTE — Therapy (Signed)
[facility] [address] [place], [place], [address] Phone: [phone]   Fax:  [phone]  Physical Therapy Evaluation  Patient Details  Name: Carl [name] MRN: [id number] Date of Birth: 09/27/1950 Referring Provider: Faith Rogue, MD  Encounter Date: 03/08/2015      PT End of Session - 03/08/15 1205    Visit Number 1   Number of Visits 17   Date for PT Re-Evaluation 05/07/15   Authorization Type MCR trad primary, BCBS secondary    PT Start Time 1015   PT Stop Time 1100   PT Time Calculation (min) 45 min   Activity Tolerance Patient tolerated treatment well   Behavior During Therapy [facility] for tasks assessed/performed      Past Medical History  Diagnosis Date  . Bradycardia   . Chronic abdominal pain   . Hesitancy of micturition     Past Surgical History  Procedure Laterality Date  . Anterior cervical decomp/discectomy fusion N/A 11/14/2014    Procedure: Cervical Five-Cervical Six  Anterior Cervical Decompression/Diskectomy/Fusion;  Surgeon: Maeola Harman, MD;  Location: [facility];  Service: Neurosurgery;  Laterality: N/A;    There were no vitals filed for this visit.  Visit Diagnosis:  C6 spinal cord injury, initial encounter (HCC) - Plan: PT plan of care cert/re-cert  Abnormality of gait - Plan: PT plan of care cert/re-cert  Unsteadiness - Plan: PT plan of care cert/re-cert  Generalized weakness - Plan: PT plan of care cert/re-cert  Left-sided weakness - Plan: PT plan of care cert/re-cert      Subjective Assessment - 03/08/15 1024    Subjective Pt presents with fall from roof with C5/6 fracture with quadraplegia.  Reports "I still need to get my legs stronger, build up my endurance, and my left hand is still lagging behind."     Patient is accompained by: Family member  Pat   Limitations Walking;Standing;House hold activities;Sitting;Lifting   Patient Stated Goals "to walk without this walker"    Currently in Pain? Yes   Pain Score 5    Pain Location Arm   Pain Orientation Right;Left   Pain Descriptors / Indicators Throbbing   Pain Type Chronic pain   Pain Radiating Towards down both arms   Pain Onset 1 to 4 weeks ago   Pain Frequency Constant   Aggravating Factors  nothing   Pain Relieving Factors Feels that it is due to medications, therefore feels better when meds are wearing off            [employer] PT Assessment - 03/08/15 0001    Assessment   Medical Diagnosis C6 quadriplegia   Referring Provider Faith Rogue, MD   Onset Date/Surgical Date 11/09/14   Hand Dominance Right   Precautions   Precautions Fall   Restrictions   Weight Bearing Restrictions No   Balance Screen   Has the patient fallen in the past 6 months No   Has the patient had a decrease in activity level because of a fear of falling?  No   Is the patient reluctant to leave their home because of a fear of falling?  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[place], working on [employer]   Overall Cognitive Status Within Functional Limits for tasks assessed   Sensation   Light Touch Impaired Detail   Light Touch Impaired Details Impaired RLE;Impaired LLE  diminished in B feet   Hot/Cold Impaired by gross assessment  reports doesn't feel hot, cold "shocks" him, L UE sensitive   Proprioception  Appears Intact   Coordination   Gross Motor Movements are Fluid and Coordinated Yes  LEs   Fine Motor Movements are Fluid and Coordinated Yes  LEs   ROM / Strength   AROM / PROM / Strength Strength   Strength   Overall Strength Deficits   Overall Strength Comments R hip WFL grossly 4+/5 and L hip grossly 3+/5   Transfers   Transfers Sit to Stand;Stand to Sit   Sit to Stand 6: Modified independent (Device/Increase time)   Stand to Sit 6: Modified independent (Device/Increase time)   Ambulation/Gait   Ambulation/Gait Yes   Ambulation/Gait Assistance 5: Supervision   Ambulation Distance (Feet) 120 Feet   Assistive device Rolling walker   Gait Pattern Step-to pattern;Step-through pattern;Decreased stride length;Decreased stance time - left;Decreased step length - right;Decreased hip/knee flexion - left;Decreased dorsiflexion - left;Left circumduction;Trunk flexed;Poor foot clearance - left  Deficits on L increase with fatigue   Ambulation Surface Level;Indoor   Gait velocity 1.43 ft/sec   Stairs Yes   Stairs Assistance 5: Supervision   Stair Management Technique Two rails;Step to pattern;Forwards   Number of Stairs 4   Height of Stairs 6   Standardized Balance Assessment   Standardized Balance Assessment Berg Balance Test   Berg Balance Test   Sit to Stand Able to stand using hands after several tries   Standing Unsupported Able to stand 2 minutes with supervision   Sitting with Back Unsupported but Feet Supported on Floor or Stool Able to sit safely and securely 2 minutes   Stand to Sit Sits safely with minimal use of hands   Transfers Able to transfer safely, minor use of hands   Standing Unsupported with Eyes Closed Able to stand 10 seconds with supervision   Standing Ubsupported with Feet Together Able to place feet together independently and stand for 1 minute with supervision   From Standing, Reach Forward with Outstretched Arm Can reach forward >12 cm safely (5")   From  Standing Position, Pick up Object from Floor Able to pick up shoe, needs supervision   From Standing Position, Turn to Look Behind Over each Shoulder Looks behind one side only/other side shows less weight shift   Turn 360 Degrees Needs close supervision or verbal cueing   Standing Unsupported, Alternately Place Feet on Step/Stool Able to complete >2 steps/needs minimal assist   Standing Unsupported, One Foot in Front Able to plae foot ahead of the other independently and hold 30 seconds   Standing on One Leg Tries to lift leg/unable to hold 3 seconds but remains standing independently   Total Score 38                           PT Education - 03/08/15 1204    Education provided Yes  Education Details Education on POC, evaluation findings, and goals for therapy   Person(s) Educated Patient;Spouse   Methods Explanation   Comprehension Verbalized understanding          PT Short Term Goals - 03/08/15 1224    PT SHORT TERM GOAL #1   Title Pt will initiate HEP for balance and strengthening in order to indicate decreased fall risk and improved functional mobility.  (Target Date: 04/05/15)   PT SHORT TERM GOAL #2   Title Pt will increase gait speed to 2.03 ft/ sec in order to indicate decreased fall risk and improved efficiency of gait.     PT SHORT TERM GOAL #3   Title Pt will increase BERG balance test to 42/56 in order to indicate decreased fall risk and improved functional balance.    PT SHORT TERM GOAL #4   Title Pt will ambulate >150' on indoor surfaces w/ SPC at mod I level in order to indicate safe home negotiation.     PT SHORT TERM GOAL #5   Title Pt will perform flight of stairs w/ single handrail in alternating pattern at mod I level in order to indicate safe negotiation in home.     Additional Short Term Goals   Additional Short Term Goals Yes   PT SHORT TERM GOAL #6   Title Pt will verbalize fall prevention strategies independently in order to better  decrease risk of falling at home.             PT Long Term Goals - 03/08/15 1228    PT LONG TERM GOAL #1   Title Pt will be independent with HEP for strengthening and balance in order to indicate improved functional mobility and decreased balance.  (Target Date: 05/02/14)   PT LONG TERM GOAL #2   Title Pt will increase gait speed to 2.63 ft/sec in order to indicate decreased fall risk and and indicate pt is safe community ambulator.     PT LONG TERM GOAL #3   Title Pt will improve BERG balance test score to 46/56 in order to indicate decreased fall risk and improved functional balance.    PT LONG TERM GOAL #4   Title Pt will ambulate >500' without AD on paved outdoor surfaces and unlevel surfaces (grass) at mod I level in order to indicate safe community and outdoor home negotiation.     PT LONG TERM GOAL #5   Title Pt will indicate return to leisure activities in order to indicate safe return to community functions.                 Plan - 03/08/15 1215    Clinical Impression Statement Pt presents s/p fall from 10' with C5/6 fracture and resulting C6 quadriplegia.  Pt now presents with generalized weakness, however LUE/LE weaker and with decreased sensation vs R side, gait instability, and decreased balance.  Per PT evaluation note gait speed is 1.43 ft/sec indicative of increase fall risk, BERG balance test of 38/56, indicative of fall risk.  Discussed results with wife and pt.  Feel that pt would greatly benefit from skilled PT at OP neuro setting to address deficits.     Pt will benefit from skilled therapeutic intervention in order to improve on the following deficits Abnormal gait;Decreased activity tolerance;Decreased balance;Decreased coordination;Decreased endurance;Decreased mobility;Decreased strength;Impaired perceived functional ability;Impaired sensation;Impaired UE functional use;Improper body mechanics;Postural dysfunction;Pain   Rehab Potential Excellent   PT Frequency  2x / week   PT Duration 8 weeks  PT Treatment/Interventions ADLs/Self Care Home Management;Electrical Stimulation;DME Instruction;Gait training;Stair training;Functional mobility training;Therapeutic exercise;Therapeutic activities;Balance training;Neuromuscular re-education;Patient/family education;Manual techniques   PT Next Visit Plan establish LLE strengthening program, balance HEP, begin to assess gait with LRAD, does he need bracing for LLE?   Consulted and Agree with Plan of Care Patient;Family member/caregiver   Family Member Consulted wife          G-Codes - 03/08/15 1234    Functional Assessment Tool Used BERG: 38/56   Functional Limitation Mobility: Walking and moving around   Mobility: Walking and Moving Around Current Status [phone]) At least 20 percent but less than 40 percent impaired, limited or restricted   Mobility: Walking and Moving Around Goal Status [phone]([phone]) At least 1 percent but less than 20 percent impaired, limited or restricted       Problem List Patient Active Problem List   Diagnosis Date Noted  . Orthostatic hypotension 11/30/2014  . C6 spinal cord injury (HCC) 11/20/2014  . Neurogenic bowel 11/20/2014  . Neurogenic bladder 11/20/2014  . Fever of unknown origin   . Quadriplegia (HCC) 11/14/2014  . C5 vertebral fracture (HCC) 11/09/2014    Harriet ButteEmily Lyndi Holbein, PT, MPT [facility] [address] [place], [place], [address] Phone: [phone]   Fax:  [phone] 03/08/2015, 12:36 PM  Name: Carl [name] MRN: [id number] Date of Birth: Jul 05, 1950

## 2015-03-09 ENCOUNTER — Other Ambulatory Visit: Payer: Self-pay | Admitting: Physical Medicine and Rehabilitation

## 2015-03-14 ENCOUNTER — Ambulatory Visit: Payer: Medicare Other | Admitting: Rehabilitation

## 2015-03-14 ENCOUNTER — Ambulatory Visit: Payer: Medicare Other | Admitting: Occupational Therapy

## 2015-03-14 ENCOUNTER — Encounter: Payer: Self-pay | Admitting: Rehabilitation

## 2015-03-14 DIAGNOSIS — R29898 Other symptoms and signs involving the musculoskeletal system: Secondary | ICD-10-CM

## 2015-03-14 DIAGNOSIS — R531 Weakness: Secondary | ICD-10-CM

## 2015-03-14 DIAGNOSIS — S14106A Unspecified injury at C6 level of cervical spinal cord, initial encounter: Secondary | ICD-10-CM | POA: Diagnosis not present

## 2015-03-14 DIAGNOSIS — R279 Unspecified lack of coordination: Secondary | ICD-10-CM

## 2015-03-14 DIAGNOSIS — R269 Unspecified abnormalities of gait and mobility: Secondary | ICD-10-CM

## 2015-03-14 DIAGNOSIS — R2681 Unsteadiness on feet: Secondary | ICD-10-CM

## 2015-03-14 NOTE — Patient Instructions (Addendum)
Functional Quadriceps: Sit to Stand    Sit on edge of chair, feet flat on floor. Stand upright, extending knees fully.  Find lower surface at home (start with your couch).  Make sure that when you stand, your legs don't hit the back of the couch.  Do not use your hands if you can help it.   Repeat _10___ times per set. Do __1__ sets per session. Do _2___ sessions per day.  http://orth.exer.us/734   Copyright  VHI. All rights reserved.   "I love a Licensed conveyancerarade" Lift    Stand next to counter top with R hand holding onto counter for safety.  March in place 10 times on each leg: as high as you can. Do __2__ sessions per day.  http://gt2.exer.us/344   Copyright  VHI. All rights reserved.   ABDUCTION: Standing (Active)    Stand, feet flat. Lift right leg out to side x 10 reps, then repeat on other side.  When moving R leg out to the R, make sure that L hip remains "activated" and tucked in.   Complete _1__ sets of _10__ repetitions. Perform _2__ sessions per day.  http://gtsc.exer.us/110   Copyright  VHI. All rights reserved.   Bracing With Bridging (Hook-Lying)    With neutral spine, tighten pelvic floor and abdominals and hold. Lift bottom. Repeat _10__ times. Do _2__ times a day.  I want you to try and hold your arms above your chest (clasp them together) so that only your legs are working during the exercise.    Copyright  VHI. All rights reserved.   Abduction: Clam (Eccentric) - Side-Lying    Lie on side with knees bent. Lift top knee, keeping feet together. Keep trunk steady. Slowly lower for 3-5 seconds. _10__ reps per set, _2__ sets per day, _5__ days per week. Make sure that your trunk does not rotate backwards, stay completely on your side.   http://ecce.exer.us/64   Copyright  VHI. All rights reserved.   Scapular Retraction (Standing)    With arms at sides, pinch shoulder blades together.  You can do this seated or standing, but make sure that you are  sitting or standing as tall as you can when doing this.  Repeat __10__ times per set. Do __1__ sets per session. Do __2__ sessions per day.  http://orth.exer.us/944   Copyright  VHI. All rights reserved.

## 2015-03-14 NOTE — Therapy (Signed)
[facility] [address] [place], [place], [address] Phone: [phone]   Fax:  [phone]  Occupational Therapy Treatment  Patient Details  Name: Carl [name] MRN: [id number] Date of Birth: [birth year] Referring Provider: Faith RogueZachary Swartz  Encounter Date: 03/14/2015      OT End of Session - 03/14/15 1331    Visit Number 2   Number of Visits 17   Date for OT Re-Evaluation 05/02/14   Authorization Type MCR - G code needed   Authorization - Visit Number 2   Authorization - Number of Visits 10   OT Start Time 1015   OT Stop Time 1100   OT Time Calculation (min) 45 min   Activity Tolerance Patient tolerated treatment well      Past Medical History  Diagnosis Date  . Bradycardia   . Chronic abdominal pain   . Hesitancy of micturition     Past Surgical History  Procedure Laterality Date  . Anterior cervical decomp/discectomy fusion N/A 11/14/2014    Procedure: Cervical Five-Cervical Six  Anterior Cervical Decompression/Diskectomy/Fusion;  Surgeon: Maeola HarmanJoseph Stern, MD;  Location: [facility];  Service: Neurosurgery;  Laterality: N/A;    There were no vitals filed for this visit.  Visit Diagnosis:  Left-sided weakness  Generalized weakness  Lack of coordination  Decreased grip strength of left hand  Decreased pinch strength      Subjective Assessment - 03/14/15 1023    Subjective  My hand has gotten better since the initial injury   Patient is accompained by: Family member   Patient Stated Goals increase strength and decrease tingling   Currently in Pain? Yes   Pain Score 3    Pain Location Arm   Pain Orientation Right;Left   Pain Descriptors / Indicators Aching;Tingling;Throbbing   Pain Type Chronic pain   Pain Onset More than a month ago   Pain Frequency Constant   Aggravating Factors  immobolization   Pain Relieving Factors moving, tramadol            [facility] OT Assessment - 03/14/15 0001    Coordination   9 Hole Peg Test Right   Right 9 Hole Peg Test 27.69 sec                  OT Treatments/Exercises (OP) - 03/14/15 0001    ADLs   ADL Comments Pt instructed in desensitization techniques Lt hand - see pt instructions   Exercises   Exercises Hand   Hand Exercises   Other Hand Exercises see pt instructions for hand strengthening bilaterally and coordination HEP for Lt hand                OT Education - 03/14/15 1100    Education provided Yes   Education Details Coordination and putty HEP, Desensitization techniques   Person(s) Educated Patient   Methods Explanation;Demonstration;Handout   Comprehension Verbalized understanding;Returned demonstration          OT Short Term Goals - 03/08/15 1305    OT SHORT TERM GOAL #1   Title Pt will increase LUE gross grip to 30 lbs to help increase overall functional use and independence of L hand    Baseline 20lbs    Time 4   Period Weeks   Status New   OT SHORT TERM GOAL #2   Title Pt will be educated on trunk/core strengthening exercises to increase overall posture and strength in order to safely perform ADLs   Time 4  Period Weeks   Status New   OT SHORT TERM GOAL #3   Title Pt will be educated on desensitization exercises to decrease hypersensitivity to LUE    Time 4   Period Weeks   Status New   OT SHORT TERM GOAL #4   Title Pt will be independent with self correcting poor tecnique and compensatory strategies to left shoulder when functionally using LUE   Time 4   Period Weeks   Status New           OT Long Term Goals - 03/08/15 1307    OT LONG TERM GOAL #1   Title Pt will increase LUE gross grip to 40 lbs to help increase overall functional use and independence of L hand    Baseline 20lbs at baseline   Time 8   Period Weeks   Status New   OT LONG TERM GOAL #2   Title Pt will be independent with LB ADLs   Baseline min assist    Time 8   Period Weeks   Status New   OT LONG TERM  GOAL #3   Title Pt will improve 9-hole peg test time to less than 55 seconds    Baseline 73 seconds   Time 8   Period Weeks   Status New   OT LONG TERM GOAL #4   Title Pt will improve left wrist strength to 4/5 strength    Baseline 2+/5 strength    Time 8   Period Weeks   Status New   OT LONG TERM GOAL #5   Title Pt will improve lateral and three pinch strength by at least 5lbs   Baseline 7lbs for both lateal and three pinch   Time 8   Period Weeks   Status New               Plan - 03/14/15 1333    Clinical Impression Statement Pt tolerating coordination and putty ex's well. Pt given higher level resistance putty for Rt hand compared to Lt    Plan review HEP prn, issue theraband HEP for BUE strengthening   OT Home Exercise Plan coordination and putty HEP, desensitization techniques 03/14/15.    Consulted and Agree with Plan of Care Patient        Problem List Patient Active Problem List   Diagnosis Date Noted  . Orthostatic hypotension 11/30/2014  . C6 spinal cord injury (HCC) 11/20/2014  . Neurogenic bowel 11/20/2014  . Neurogenic bladder 11/20/2014  . Fever of unknown origin   . Quadriplegia (HCC) 11/14/2014  . C5 vertebral fracture (HCC) 11/09/2014    Kelli Churn, OTR/L 03/14/2015, 1:35 PM  [facility] [address] [place], [place], [address] Phone: [phone]   Fax:  [phone]  Name: Carl [name] MRN: [id number] Date of Birth: Feb 18, 1951

## 2015-03-14 NOTE — Therapy (Signed)
[facility] [address] [place], [place], [address] Phone: [phone]   Fax:  [phone]  Physical Therapy Treatment  Patient Details  Name: Carl [name] MRN: [id number] Date of Birth: 09/05/1950 Referring Provider: Faith Rogue, MD  Encounter Date: 03/14/2015      PT End of Session - 03/14/15 1157    Visit Number 2   Number of Visits 17   Date for PT Re-Evaluation 05/07/15   Authorization Type MCR trad primary, BCBS secondary    PT Start Time 1100   PT Stop Time 1146   PT Time Calculation (min) 46 min   Activity Tolerance Patient tolerated treatment well   Behavior During Therapy [facility] for tasks assessed/performed      Past Medical History  Diagnosis Date  . Bradycardia   . Chronic abdominal pain   . Hesitancy of micturition     Past Surgical History  Procedure Laterality Date  . Anterior cervical decomp/discectomy fusion N/A 11/14/2014    Procedure: Cervical Five-Cervical Six  Anterior Cervical Decompression/Diskectomy/Fusion;  Surgeon: Maeola Harman, MD;  Location: [facility];  Service: Neurosurgery;  Laterality: N/A;    There were no vitals filed for this visit.  Visit Diagnosis:  Abnormality of gait  Unsteadiness  Generalized weakness  Left-sided weakness  Decreased strength of trunk and back      Subjective Assessment - 03/14/15 1107    Subjective Pt reports no changes since last visit, no falls.  Still reports numbness and tingling in L hand and up to shoulder.   Limitations Walking;Standing;House hold activities;Sitting;Lifting   Patient Stated Goals "to walk without this walker"   Currently in Pain? Yes   Pain Score 3    Pain Location Arm   Pain Orientation Left   Pain Descriptors / Indicators Tingling   Pain Type Chronic pain   Pain Onset More than a month ago   Pain Frequency Constant           TE:  Performed BLE strengthening in supine, SL and sitting, see pt  instruction for full details.  Tolerated all well with cues for increased L hip abd activation esp during R hip abd task.  Also performed seated scapular retraction in order to work on upright posture and increase strength in B rhomboid/middle trap region.  Performed x 10 reps with red theraband and without .  Provided exercise without band in order to address improved technique rather than over exertion and poor technique due to L UE weakness.  Also performed seated resisted DF with red theraband x 10 reps BLEs.    NMR:  Addressed gait with tripod cane x 115' with focus on slow stepping to address increased isolated L hip and knee flexion, increased heel contact at initial contact, and increased L hip stabilization during stance.  Continue to note L hip tends to drop during stance, therefore will continue to address in future sessions.  Min A to provide facilitation at L hip for increased WB and activation with decreased L hip retraction as well as at trunk and chest for upright posture.    Gait:  Addressed gait with use of RW in order to improve stepping sequence and pattern as well as focus on improved posture throughout gait.  Performed x 230' at S level.                          PT Education - 03/14/15 1108    Education  provided Yes   Education Details Education on HEP for BLE strengthening and balance as well as posture during all mobility.    Person(s) Educated Patient   Methods Explanation   Comprehension Verbalized understanding          PT Short Term Goals - 03/08/15 1224    PT SHORT TERM GOAL #1   Title Pt will initiate HEP for balance and strengthening in order to indicate decreased fall risk and improved functional mobility.  (Target Date: 04/05/15)   PT SHORT TERM GOAL #2   Title Pt will increase gait speed to 2.03 ft/ sec in order to indicate decreased fall risk and improved efficiency of gait.     PT SHORT TERM GOAL #3   Title Pt will increase BERG balance  test to 42/56 in order to indicate decreased fall risk and improved functional balance.    PT SHORT TERM GOAL #4   Title Pt will ambulate >150' on indoor surfaces w/ SPC at mod I level in order to indicate safe home negotiation.     PT SHORT TERM GOAL #5   Title Pt will perform flight of stairs w/ single handrail in alternating pattern at mod I level in order to indicate safe negotiation in home.     Additional Short Term Goals   Additional Short Term Goals Yes   PT SHORT TERM GOAL #6   Title Pt will verbalize fall prevention strategies independently in order to better decrease risk of falling at home.             PT Long Term Goals - 03/08/15 1228    PT LONG TERM GOAL #1   Title Pt will be independent with HEP for strengthening and balance in order to indicate improved functional mobility and decreased balance.  (Target Date: 05/02/14)   PT LONG TERM GOAL #2   Title Pt will increase gait speed to 2.63 ft/sec in order to indicate decreased fall risk and and indicate pt is safe community ambulator.     PT LONG TERM GOAL #3   Title Pt will improve BERG balance test score to 46/56 in order to indicate decreased fall risk and improved functional balance.    PT LONG TERM GOAL #4   Title Pt will ambulate >500' without AD on paved outdoor surfaces and unlevel surfaces (grass) at mod I level in order to indicate safe community and outdoor home negotiation.     PT LONG TERM GOAL #5   Title Pt will indicate return to leisure activities in order to indicate safe return to community functions.                 Plan - 03/14/15 1158    Clinical Impression Statement Skilled session focused on initiation of HEP for BLE strengthening, esp on LLE with open and closed chain exercise, see pt instruction for further details.  Also addressed gait with use of RW progressing to tripod cane in order to further address L hip/knee flexion, L hip stabilization (during stance) and decreasing amount of UE  support (leading to forward flexed posture).      Pt will benefit from skilled therapeutic intervention in order to improve on the following deficits Abnormal gait;Decreased activity tolerance;Decreased balance;Decreased coordination;Decreased endurance;Decreased mobility;Decreased strength;Impaired perceived functional ability;Impaired sensation;Impaired UE functional use;Improper body mechanics;Postural dysfunction;Pain   Rehab Potential Excellent   PT Frequency 2x / week   PT Duration 8 weeks   PT Treatment/Interventions ADLs/Self Care Home Management;Electrical Stimulation;DME Instruction;Gait training;Stair  training;Functional mobility training;Therapeutic exercise;Therapeutic activities;Balance training;Neuromuscular re-education;Patient/family education;Manual techniques   PT Next Visit Plan Assess compliance with  LLE strengthening program, add balance to HEP, begin to assess gait with LRAD, does he need bracing for LLE (may fatigue leg more)?   PT Home Exercise Plan see pt instruction from 03/14/15   Consulted and Agree with Plan of Care Patient        Problem List Patient Active Problem List   Diagnosis Date Noted  . Orthostatic hypotension 11/30/2014  . C6 spinal cord injury (HCC) 11/20/2014  . Neurogenic bowel 11/20/2014  . Neurogenic bladder 11/20/2014  . Fever of unknown origin   . Quadriplegia (HCC) 11/14/2014  . C5 vertebral fracture (HCC) 11/09/2014    Harriet Butte, PT, MPT [facility] [address] [place], [place], [address] Phone: [phone]   Fax:  [phone] 03/14/2015, 12:10 PM  Name: Kenniel Bergsma MRN: [id number] Date of Birth: 11/18/1950

## 2015-03-14 NOTE — Patient Instructions (Signed)
**  KEEP LT SHOULDER DOWN, AND POSTURE UP DURING BELOW ACTIVITIES    Coordination Activities  Perform the following activities for 15 minutes 1-2 times per day with left hand(s).   Rotate ball in fingertips (clockwise and counter-clockwise).  Flip cards 1 at a time as fast as you can.  Deal cards with your thumb (Hold deck in hand and push card off top with thumb).  Rotate 1 card in hand (clockwise and counter-clockwise).  Pick up coins and stack.  Pick up coins (quarters or checkers) one at a time until you get 5 in your hand, then move coins from palm to fingertips to stack one at a time.  Twirl pen between fingers.  practice playing piano   PUTTY EXERCISES: USE RED PUTTY FOR LT HAND, GREEN PUTTY FOR RT HAND  1. Grip Strengthening (Resistive Putty)   Squeeze putty using thumb and all fingers. Repeat _20___ times. Do __2__ sessions per day.   2. Roll putty into tube on table and pinch between each finger and thumb x 10 reps each. (do ring and small finger together)     Desensitization Techniques  Perform these exercises ever 2 hours for 15 minute sessions.  Progress to the next exercises when the exercises you are doing become easy.  1)  Using light pressure, rub the various textures along with the hypersensitive area:  A.  Flannel  E.  Polyester  B.  Velvet  F.  Corduroy  C.  Wool  G.  Cotton material  D.  Terry cloth  2)  With the same textures use a firmer pressure. 3)  Use a hand held vibrator and massage along the sensitive area. 4)  With a small dowel rod, eraser on a pencil or base of an ink pen tap along the sensitive area. 5)  Use an empty roll-on deodorant bottle to roll along the sensitive area. 6)  Place your hand/forearm in separate containers of the following items:  A.  Sand  D.  Dry lentil beans  B.  Dry Rice  E.  Dry kidney beans  C.  Ball bearings F.  Dry pinto beans

## 2015-03-16 ENCOUNTER — Ambulatory Visit: Payer: Medicare Other | Admitting: Physical Therapy

## 2015-03-16 ENCOUNTER — Encounter: Payer: Self-pay | Admitting: Physical Therapy

## 2015-03-16 ENCOUNTER — Ambulatory Visit: Payer: Medicare Other | Admitting: *Deleted

## 2015-03-16 DIAGNOSIS — R6889 Other general symptoms and signs: Secondary | ICD-10-CM

## 2015-03-16 DIAGNOSIS — R279 Unspecified lack of coordination: Secondary | ICD-10-CM

## 2015-03-16 DIAGNOSIS — S14106A Unspecified injury at C6 level of cervical spinal cord, initial encounter: Secondary | ICD-10-CM

## 2015-03-16 DIAGNOSIS — R531 Weakness: Secondary | ICD-10-CM

## 2015-03-16 DIAGNOSIS — R2681 Unsteadiness on feet: Secondary | ICD-10-CM

## 2015-03-16 DIAGNOSIS — M25532 Pain in left wrist: Secondary | ICD-10-CM

## 2015-03-16 DIAGNOSIS — R29898 Other symptoms and signs involving the musculoskeletal system: Secondary | ICD-10-CM

## 2015-03-16 DIAGNOSIS — R269 Unspecified abnormalities of gait and mobility: Secondary | ICD-10-CM

## 2015-03-16 NOTE — Therapy (Signed)
[facility] [address] [place], [place], [address] Phone: [phone]   Fax:  [phone]  Physical Therapy Treatment  Patient Details  Name: Carl [name] MRN: [id number] Date of Birth: 1950-10-11 Referring Provider: Faith RogueZachary Swartz, MD  Encounter Date: 03/16/2015      PT End of Session - 03/16/15 1153    Visit Number 3   Number of Visits 17   Date for PT Re-Evaluation 05/07/15   Authorization Type MCR trad primary, BCBS secondary    PT Start Time 1148   PT Stop Time 1230   PT Time Calculation (min) 42 min   Activity Tolerance Patient tolerated treatment well   Behavior [facility] for tasks assessed/performed      Past Medical History  Diagnosis Date  . Bradycardia   . Chronic abdominal pain   . Hesitancy of micturition     Past Surgical History  Procedure Laterality Date  . Anterior cervical decomp/discectomy fusion N/A 11/14/2014    Procedure: Cervical Five-Cervical Six  Anterior Cervical Decompression/Diskectomy/Fusion;  Surgeon: Maeola HarmanJoseph Stern, MD;  Location: [facility];  Service: Neurosurgery;  Laterality: N/A;    There were no vitals filed for this visit.  Visit Diagnosis:  Lack of coordination  Abnormality of gait  Unsteadiness  Decreased functional activity tolerance  Generalized weakness  Decreased strength of trunk and back  C6 spinal cord injury, initial encounter [facility](HCC)      Subjective Assessment - 03/16/15 1151    Subjective No new complaints. No falls. Some hand/arm pain (OT currently addressing this).   Limitations Walking;Standing;House hold activities;Sitting;Lifting   Patient Stated Goals "to walk without this walker"   Currently in Pain? Yes   Pain Location Arm   Pain Orientation Right;Left   Pain Descriptors / Indicators Tingling;Throbbing   Pain Type Chronic pain   Pain Radiating Towards down both arms   Pain Onset More than a month ago   Pain Frequency  Constant   Aggravating Factors  immobility   Pain Relieving Factors heat, tramadol, movement     treatment: In corner: min guard assist to min assist for balance.  Eyes closed no head movements, eyes closed head nods/shakes/diagonals both ways. - progressed from feet together on floor>feet apart on pillow>feet together on pillow.   at counter top: min guard to min assist for balance with cues on posture and ex form. Single UE support unless otherwise stated. - side stepping x 3 laps each way with bil UE support - tandem walking forward/backwards x 3 laps each way - toe walking forward/backwards x 3 laps each way - heel walking forwards/backwards x 1 lap each way, did not issue this one for home due to instability with performing it here is session. - single leg standing, 10 sec's x 3 each leg with bil UE support        PT Education - 03/16/15 1229    Education provided Yes   Education Details HEP: corner and counter top balance activites.    Person(s) Educated Patient   Methods Explanation;Demonstration;Handout   Comprehension Verbalized understanding;Returned demonstration          PT Short Term Goals - 03/08/15 1224    PT SHORT TERM GOAL #1   Title Pt will initiate HEP for balance and strengthening in order to indicate decreased fall risk and improved functional mobility.  (Target Date: 04/05/15)   PT SHORT TERM GOAL #2   Title Pt will increase gait speed to 2.03 ft/ sec  in order to indicate decreased fall risk and improved efficiency of gait.     PT SHORT TERM GOAL #3   Title Pt will increase BERG balance test to 42/56 in order to indicate decreased fall risk and improved functional balance.    PT SHORT TERM GOAL #4   Title Pt will ambulate >150' on indoor surfaces w/ SPC at mod I level in order to indicate safe home negotiation.     PT SHORT TERM GOAL #5   Title Pt will perform flight of stairs w/ single handrail in alternating pattern at mod I level in order to indicate  safe negotiation in home.     Additional Short Term Goals   Additional Short Term Goals Yes   PT SHORT TERM GOAL #6   Title Pt will verbalize fall prevention strategies independently in order to better decrease risk of falling at home.             PT Long Term Goals - 03/08/15 1228    PT LONG TERM GOAL #1   Title Pt will be independent with HEP for strengthening and balance in order to indicate improved functional mobility and decreased balance.  (Target Date: 05/02/14)   PT LONG TERM GOAL #2   Title Pt will increase gait speed to 2.63 ft/sec in order to indicate decreased fall risk and and indicate pt is safe community ambulator.     PT LONG TERM GOAL #3   Title Pt will improve BERG balance test score to 46/56 in order to indicate decreased fall risk and improved functional balance.    PT LONG TERM GOAL #4   Title Pt will ambulate >500' without AD on paved outdoor surfaces and unlevel surfaces (grass) at mod I level in order to indicate safe community and outdoor home negotiation.     PT LONG TERM GOAL #5   Title Pt will indicate return to leisure activities in order to indicate safe return to community functions.             Plan - 03/16/15 1153    Clinical Impression Statement Focused today's session on issuing HEP to address balance. No issues reported during session. Pt is making steady progress toward goals.    Pt will benefit from skilled therapeutic intervention in order to improve on the following deficits Abnormal gait;Decreased activity tolerance;Decreased balance;Decreased coordination;Decreased endurance;Decreased mobility;Decreased strength;Impaired perceived functional ability;Impaired sensation;Impaired UE functional use;Improper body mechanics;Postural dysfunction;Pain   Rehab Potential Excellent   PT Frequency 2x / week   PT Duration 8 weeks   PT Treatment/Interventions ADLs/Self Care Home Management;Electrical Stimulation;DME Instruction;Gait training;Stair  training;Functional mobility training;Therapeutic exercise;Therapeutic activities;Balance training;Neuromuscular re-education;Patient/family education;Manual techniques   PT Next Visit Plan  begin to assess gait with LRAD, does he need bracing for LLE (may fatigue leg more)?; exercises for posture strengthening    PT Home Exercise Plan see pt instruction from 03/14/15   Consulted and Agree with Plan of Care Patient        Problem List Patient Active Problem List   Diagnosis Date Noted  . Orthostatic hypotension 11/30/2014  . C6 spinal cord injury (HCC) 11/20/2014  . Neurogenic bowel 11/20/2014  . Neurogenic bladder 11/20/2014  . Fever of unknown origin   . Quadriplegia (HCC) 11/14/2014  . C5 vertebral fracture (HCC) 11/09/2014    Sallyanne Kuster 03/16/2015, 1:09 PM  Sallyanne Kuster, PTA, [facility] [address] [place], [place] [address] [phone] 03/16/2015, 1:09 PM   Name: Carl [name] MRN: [id number] Date of Birth: 08/10/1950

## 2015-03-16 NOTE — Patient Instructions (Signed)
Feet Together (Compliant Surface) Head Motion - Eyes Closed    In corner with chair in front of you for safety: Stand on compliant surface: pillow with feet together. Close eyes and move head: 1. Slowly, up and down. 2. Slowly, left to right 3. Slowly, diagonals both ways Repeat __10__ times each. Do _1-2_ sessions per day.  Copyright  VHI. All rights reserved.  Side-Stepping    At countertop with both hand support: Walk sideways toward one direction (side stepping)  with eyes open. Take even steps, leading with same foot. Make sure each foot lifts off the floor. Repeat in opposite direction. Repeat for 3 laps each way. Do _1-2__ sessions per day.   Copyright  VHI. All rights reserved.  Feet Heel-Toe "Tandem"    At countertop with right hand support on counter: walk a straight line bringing one foot directly in front of the other forward and then backwards to the starting position. Repeat for 3 laps each direction. Do _1-2___ sessions per day.  Copyright  VHI. All rights reserved.  Walking on Toes    At counter with right hand support: Walk on toes forward and then backwards while continuing on a straight path. 3 laps each way. Do _1-2___ sessions per day.  Copyright  VHI. All rights reserved.  Single Leg - Eyes Open    Holding support/at countertop: lift left leg while maintaining balance over right leg. Progress to removing hands from support surface for longer periods of time. Hold_10-20___ seconds. Repeat with standing on left leg and lifting right leg. Progress the hold time from 10 seconds to 20 seconds as it gets easier. Repeat 3-5__ times each leg. Do __1-2__ sessions per day.  Copyright  VHI. All rights reserved.

## 2015-03-16 NOTE — Therapy (Signed)
[facility] [address] [place], [place], [address] Phone: [phone]   Fax:  [phone]  Occupational Therapy Treatment  Patient Details  Name: Carl [name] MRN: [id number] Date of Birth: [birth year] Referring Provider: Faith RogueZachary Swartz  Encounter Date: 03/16/2015      OT End of Session - 03/16/15 1212    Visit Number 3   Number of Visits 17   Date for OT Re-Evaluation 05/02/14   Authorization Type MCR - G code needed   Authorization - Visit Number 3   Authorization - Number of Visits 10   OT Start Time 1100   OT Stop Time 1145   OT Time Calculation (min) 45 min   Activity Tolerance Patient tolerated treatment well   Behavior During Therapy [facility] for tasks assessed/performed      Past Medical History  Diagnosis Date  . Bradycardia   . Chronic abdominal pain   . Hesitancy of micturition     Past Surgical History  Procedure Laterality Date  . Anterior cervical decomp/discectomy fusion N/A 11/14/2014    Procedure: Cervical Five-Cervical Six  Anterior Cervical Decompression/Diskectomy/Fusion;  Surgeon: Maeola HarmanJoseph Stern, MD;  Location: [facility];  Service: Neurosurgery;  Laterality: N/A;    There were no vitals filed for this visit.  Visit Diagnosis:  Left-sided weakness - Plan: Ot plan of care cert/re-cert  Lack of coordination - Plan: Ot plan of care cert/re-cert  Decreased grip strength of left hand - Plan: Ot plan of care cert/re-cert  Decreased pinch strength - Plan: Ot plan of care cert/re-cert  Generalized weakness - Plan: Ot plan of care cert/re-cert  Decreased strength of trunk and back - Plan: Ot plan of care cert/re-cert  C6 spinal cord injury, initial encounter [facility](HCC) - Plan: Ot plan of care cert/re-cert  Decreased functional activity tolerance - Plan: Ot plan of care cert/re-cert  Pain in left wrist - Plan: Ot plan of care cert/re-cert      Subjective Assessment - 03/16/15 1114    Subjective  "I'm feeling a lot better today than I did last time"   Patient Stated Goals increase strength and decrease tingling   Currently in Pain? Yes   Pain Score 3    Pain Location Shoulder   Pain Orientation Left;Right   Pain Descriptors / Indicators Throbbing   Pain Type Chronic pain   Pain Onset More than a month ago   Aggravating Factors  immobilization    Pain Relieving Factors moving and tramadol   Multiple Pain Sites Yes   Pain Score 3   Pain Location Hand   Pain Orientation Right;Left   Pain Descriptors / Indicators Tingling   Pain Type Chronic pain   Pain Onset More than a month ago   Aggravating Factors  touching rough or cold objects    Pain Relieving Factors rubbing with other hand and hot water    Effect of Pain on Daily Activities hurts to complete dressing at times           Pt with complaints of tingling in bilateral hands. Pt reports he has been using rice or beans at home as a HEP for desensitization. Believe patient would benefit from Fluidtherapy. Added Fluidtherapy to patient's plan of care and treatments/interventions; submitted a re-cert.   Patient verbalized understanding of fine motor control/coordination exercises taught last OT session.  Therapist had patient work on desensitization and stereognosis in bucket full of rice. Pt used left hand to reach in, grab an object, and  name the object without looking at it. Therapist then had patient use right hand to complete activity in rice and wrapped left hand in moist heat to also help with desensitization. Wrapped left hand for ~5 minutes, then wrapped right hand. While right hand wrapped patient engaged in small peg board activity to focus on fine motor coordination and control using left hand. Pt then completed grooved peg board activity with right hand using tweezers.   Therapist then administered blue theraband for BUE strengthening HEP. Patient showed therapist previous exercises he has been completing  with theraband at home, taught to him by his HHOT. Therapist added 3 new exercises; horizontal abduction going up (thumb leading way), horizontal abduction going down (pinky leading way), and latissimus dorsi & posterior deltoid pulls in front. Encouraged patient to perform all exercises (including ones taught by [facility]) at least once per day in conjunction with his fine motor coordination exercises and desensitization exercises.                  OT Education - 03/16/15 1158    Education provided Yes   Education Details moist heat to help with desensitization and tingling pain in bilateral hands, theraband HEP (added on from original one pt started with HHOT)    Person(s) Educated Patient   Methods Explanation;Demonstration   Comprehension Verbalized understanding;Returned demonstration          OT Short Term Goals - 03/16/15 1212    OT SHORT TERM GOAL #1   Title Pt will increase LUE gross grip to 30 lbs to help increase overall functional use and independence of L hand    Baseline 20lbs    Time 4   Period Weeks   Status On-going   OT SHORT TERM GOAL #2   Title Pt will be educated on trunk/core strengthening exercises to increase overall posture and strength in order to safely perform ADLs   Time 4   Period Weeks   Status On-going   OT SHORT TERM GOAL #3   Title Pt will be educated on desensitization exercises to decrease hypersensitivity to LUE    Time 4   Period Weeks   Status On-going   OT SHORT TERM GOAL #4   Title Pt will be independent with self correcting poor tecnique and compensatory strategies to left shoulder when functionally using LUE   Time 4   Period Weeks   Status On-going           OT Long Term Goals - 03/16/15 1212    OT LONG TERM GOAL #1   Title Pt will increase LUE gross grip to 40 lbs to help increase overall functional use and independence of L hand    Baseline 20lbs at baseline   Time 8   Period Weeks   Status On-going   OT LONG TERM  GOAL #2   Title Pt will be independent with LB ADLs   Baseline min assist    Time 8   Period Weeks   Status On-going   OT LONG TERM GOAL #3   Title Pt will improve 9-hole peg test time to less than 55 seconds    Baseline 73 seconds   Time 8   Period Weeks   Status On-going   OT LONG TERM GOAL #4   Title Pt will improve left wrist strength to 4/5 strength    Baseline 2+/5 strength    Time 8   Period Weeks   Status On-going   OT LONG  TERM GOAL #5   Title Pt will improve lateral and three pinch strength by at least 5lbs   Baseline 7lbs for both lateal and three pinch   Time 8   Period Weeks   Status On-going               Plan - 03/16/15 1203    Clinical Impression Statement Pt is progressing towards OT goals. Pt continues to be limited by tingling in bilateral hands. Pt is motivated to decrease pain and overall strength, fine motor control and coordination in left hand.    Pt will benefit from skilled therapeutic intervention in order to improve on the following deficits (Retired) Decreased activity tolerance;Decreased balance;Decreased coordination;Decreased endurance;Decreased mobility;Decreased range of motion;Decreased safety awareness;Decreased skin integrity;Decreased strength;Increased edema;Impaired UE functional use;Pain;Improper body mechanics;Increased muscle spasms;Impaired sensation   Rehab Potential Excellent   Clinical Impairments Affecting Rehab Potential None known at this time   OT Frequency 2x / week   OT Duration 8 weeks   OT Treatment/Interventions Self-care/ADL training;Electrical Stimulation;Moist Heat;Traction;Therapeutic exercise;Neuromuscular education;Energy conservation;Functional Mobility Training;Passive range of motion;Therapeutic exercises;Therapeutic activities;Patient/family education;Balance training;Fluidtherapy   Plan fluidotherapy (added and submitted re-cert for this), review coordination/control HEP and theraband HEP for BUE   OT Home  Exercise Plan coordination and putty HEP, desensitization techniques 03/14/15, theraband HEP   Consulted and Agree with Plan of Care Patient        Problem List Patient Active Problem List   Diagnosis Date Noted  . Orthostatic hypotension 11/30/2014  . C6 spinal cord injury (HCC) 11/20/2014  . Neurogenic bowel 11/20/2014  . Neurogenic bladder 11/20/2014  . Fever of unknown origin   . Quadriplegia (HCC) 11/14/2014  . C5 vertebral fracture (HCC) 11/09/2014    Shataya Winkles , MS, OTR/L, CLT Pager: [phone]  03/16/2015, 12:21 PM  [facility] [address] [place], [place], [address] Phone: [phone]   Fax:  [phone]  Name: Carl [name] MRN: [id number] Date of Birth: 06-24-1950

## 2015-03-19 ENCOUNTER — Ambulatory Visit: Payer: Medicare Other | Admitting: Occupational Therapy

## 2015-03-19 ENCOUNTER — Ambulatory Visit: Payer: Medicare Other | Admitting: Physical Therapy

## 2015-03-19 DIAGNOSIS — R29898 Other symptoms and signs involving the musculoskeletal system: Secondary | ICD-10-CM

## 2015-03-19 DIAGNOSIS — R269 Unspecified abnormalities of gait and mobility: Secondary | ICD-10-CM

## 2015-03-19 DIAGNOSIS — R531 Weakness: Secondary | ICD-10-CM

## 2015-03-19 DIAGNOSIS — M25532 Pain in left wrist: Secondary | ICD-10-CM

## 2015-03-19 DIAGNOSIS — S14106A Unspecified injury at C6 level of cervical spinal cord, initial encounter: Secondary | ICD-10-CM | POA: Diagnosis not present

## 2015-03-19 NOTE — Patient Instructions (Addendum)
Hip Flexor Stretch- LEFT    Lie down on your back on a lower surface (like the hospital bed) with your right knee bent (as pictured), left knee hanging off edge of bed. Your left foot should touch the floor. You'll feel a gentle stretch in the front of your LEFT thigh. Hold for 60 seconds, twice per day on the LEFT side.  HIP: Hamstrings - Short Sitting    Rest LEFT leg on raised surface. Keep knee straight. Lift chest.   1. Hamstring stretch: Hinge forward at waist to feel gentle stretch behind your left knee. Hold __30_ seconds, 4 times per day. 2. Keep your LEFT foot on the raised surface with left knee straight. Use muscles to bend left ankle up and out. Perform 20 reps, 1-2 times per day. 3. Take your left foot off the raised surface. Bend left knee. Use muscles to bend left ankle up and out. Perform 20 reps, 1-2 times per day.

## 2015-03-19 NOTE — Therapy (Signed)
[facility] [address] [place], [place], [address] Phone: [phone]   Fax:  [phone]  Occupational Therapy Treatment  Patient Details  Name: Carl [name] MRN: [id number] Date of Birth: 09-13-1950 Referring Provider: Faith RogueZachary Swartz  Encounter Date: 03/19/2015      OT End of Session - 03/19/15 1118    Visit Number 4   Number of Visits 17   Date for OT Re-Evaluation 05/02/14   Authorization Type MCR - G code needed   Authorization - Visit Number 4   Authorization - Number of Visits 10   OT Start Time 1020   OT Stop Time 1110   OT Time Calculation (min) 50 min   Equipment Utilized During Treatment fluidotherapy   Activity Tolerance Patient tolerated treatment well      Past Medical History  Diagnosis Date  . Bradycardia   . Chronic abdominal pain   . Hesitancy of micturition     Past Surgical History  Procedure Laterality Date  . Anterior cervical decomp/discectomy fusion N/A 11/14/2014    Procedure: Cervical Five-Cervical Six  Anterior Cervical Decompression/Diskectomy/Fusion;  Surgeon: Maeola HarmanJoseph Stern, MD;  Location: [facility];  Service: Neurosurgery;  Laterality: N/A;    There were no vitals filed for this visit.  Visit Diagnosis:  Generalized weakness  Decreased strength of trunk and back  Pain in left wrist      Subjective Assessment - 03/19/15 1023    Subjective  The exercises are doing OK   Patient Stated Goals increase strength and decrease tingling   Currently in Pain? Yes   Pain Score 2    Pain Location Arm   Pain Orientation Right;Left   Pain Descriptors / Indicators Throbbing;Tingling   Pain Type Chronic pain   Pain Onset More than a month ago   Pain Frequency Constant   Aggravating Factors  Inactivity   Pain Relieving Factors activity, pain medicine                      OT Treatments/Exercises (OP) - 03/19/15 0001    Exercises   Exercises Shoulder   Shoulder Exercises: ROM/Strengthening   Other ROM/Strengthening Exercises Reviewed all theraband exercises with cues to move slowly through entire movement for concentric and eccentric control and cues to use LUE equally to RUE with bilateral exercises   Neurological Re-education Exercises   Other Exercises 1 Trunk control on physioball with A-P wt shifts, side to side wt shifts with mod facilitation to perform correctly. Progressed to placing pt outside BOS and pt using trunk muscles to bring trunk back to center with mod difficulty. Pt also noted to have decreased trunk extension and disassociation b/t trunk and pelvis.    Modalities   Modalities Fluidotherapy   LUE Fluidotherapy   Number Minutes Fluidotherapy 10 Minutes   LUE Fluidotherapy Location Hand;Wrist   Comments for pain management and desensitization Lt hand                  OT Short Term Goals - 03/16/15 1212    OT SHORT TERM GOAL #1   Title Pt will increase LUE gross grip to 30 lbs to help increase overall functional use and independence of L hand    Baseline 20lbs    Time 4   Period Weeks   Status On-going   OT SHORT TERM GOAL #2   Title Pt will be educated on trunk/core strengthening exercises to increase overall posture and strength in order  to safely perform ADLs   Time 4   Period Weeks   Status On-going   OT SHORT TERM GOAL #3   Title Pt will be educated on desensitization exercises to decrease hypersensitivity to LUE    Time 4   Period Weeks   Status On-going   OT SHORT TERM GOAL #4   Title Pt will be independent with self correcting poor tecnique and compensatory strategies to left shoulder when functionally using LUE   Time 4   Period Weeks   Status On-going           OT Long Term Goals - 03/16/15 1212    OT LONG TERM GOAL #1   Title Pt will increase LUE gross grip to 40 lbs to help increase overall functional use and independence of L hand    Baseline 20lbs at baseline   Time 8   Period  Weeks   Status On-going   OT LONG TERM GOAL #2   Title Pt will be independent with LB ADLs   Baseline min assist    Time 8   Period Weeks   Status On-going   OT LONG TERM GOAL #3   Title Pt will improve 9-hole peg test time to less than 55 seconds    Baseline 73 seconds   Time 8   Period Weeks   Status On-going   OT LONG TERM GOAL #4   Title Pt will improve left wrist strength to 4/5 strength    Baseline 2+/5 strength    Time 8   Period Weeks   Status On-going   OT LONG TERM GOAL #5   Title Pt will improve lateral and three pinch strength by at least 5lbs   Baseline 7lbs for both lateal and three pinch   Time 8   Period Weeks   Status On-going               Plan - 03/19/15 1118    Clinical Impression Statement Pt with decreased flexibility and control with trunk/posture.    Plan fluidotherapy, continue coordination and grip strength Lt hand   OT Home Exercise Plan coordination and putty HEP, desensitization techniques 03/14/15, theraband HEP 03/16/15        Problem List Patient Active Problem List   Diagnosis Date Noted  . Orthostatic hypotension 11/30/2014  . C6 spinal cord injury (HCC) 11/20/2014  . Neurogenic bowel 11/20/2014  . Neurogenic bladder 11/20/2014  . Fever of unknown origin   . Quadriplegia (HCC) 11/14/2014  . C5 vertebral fracture (HCC) 11/09/2014    Kelli Churn, OTR/L 03/19/2015, 11:20 AM  [facility] [address] [place], [place], [address] Phone: [phone]   Fax:  [phone]  Name: Carl [name] MRN: [id number] Date of Birth: 1950/07/27

## 2015-03-19 NOTE — Therapy (Signed)
[facility] [address] [place], [place], [address] Phone: [phone]   Fax:  [phone]  Physical Therapy Treatment  Patient Details  Name: Carl [name] MRN: [id number] Date of Birth: 06-13-1950 Referring Provider: Faith Rogue, MD  Encounter Date: 03/19/2015      PT End of Session - 03/19/15 2021    Visit Number 4   Number of Visits 17   Date for PT Re-Evaluation 05/07/15   Authorization Type MCR trad primary, BCBS secondary    PT Start Time 0933   PT Stop Time 1020   PT Time Calculation (min) 47 min   Activity Tolerance Patient tolerated treatment well   Behavior During Therapy [facility] for tasks assessed/performed      Past Medical History  Diagnosis Date  . Bradycardia   . Chronic abdominal pain   . Hesitancy of micturition     Past Surgical History  Procedure Laterality Date  . Anterior cervical decomp/discectomy fusion N/A 11/14/2014    Procedure: Cervical Five-Cervical Six  Anterior Cervical Decompression/Diskectomy/Fusion;  Surgeon: Maeola Harman, MD;  Location: [facility];  Service: Neurosurgery;  Laterality: N/A;    There were no vitals filed for this visit.  Visit Diagnosis:  Abnormality of gait  Left-sided weakness      Subjective Assessment - 03/19/15 0936    Subjective Pt reports no significant changes, no falls. Does notice L toe dragging when walking at home. Pt continues to experience pain in BUE's.   Limitations Walking;Standing;House hold activities;Sitting;Lifting   Patient Stated Goals "to walk without this walker"   Currently in Pain? Yes   Pain Score 2    Pain Orientation Right;Left   Pain Descriptors / Indicators Throbbing;Tingling   Pain Type Chronic pain   Pain Radiating Towards into both hands   Pain Onset More than a month ago   Pain Frequency Constant   Aggravating Factors  inactivity   Pain Relieving Factors activity, pain medicine   Effect of Pain on Daily  Activities continues to affect ability to pick up things with L hand   Multiple Pain Sites No            [facility] PT Assessment - 03/19/15 0001    Tone   Assessment Location Right Lower Extremity;Left Lower Extremity   Flexibility   Soft Tissue Assessment /Muscle Length yes   Quadriceps Maisie Fus Test reveals limited extensibility in R rectus femoris.   RLE Tone   RLE Tone Other (comment);Modified Ashworth  clonus > 5 beats in gatroc and soleus   RLE Tone   Modified Ashworth Scale for Grading Hypertonia RLE Slight increase in muscle tone, manifested by a catch and release or by minimal resistance at the end of the range of motion when the affected part(s) is moved in flexion or extension  quadriceps   LLE Tone   LLE Tone Other (comment);Modified Ashworth  clonus > 5 beats in gastroc and soleus   LLE Tone   Modified Ashworth Scale for Grading Hypertonia LLE More marked increase in muscle tone through most of the ROM, but affected part(s) easily moved  quadriceps and hamstrings                     [facility] Adult PT Treatment/Exercise - 03/19/15 0001    Ambulation/Gait   Ambulation/Gait Yes   Ambulation/Gait Assistance 5: Supervision   Ambulation/Gait Assistance Details During initial gait trial, noted limited L hip/knee flexion and decreased L ankle DF during LLE advancement;  intermittent L toe drag; limited L knee extension during L stance; limited B hip extension throughout gait cycle; and LLE adduction (prominence during L swing > L stance phase of gait).  Performed LLE stretching (see below for details) for spasticity management with no significant change in gait trial after stretching, with exception of minimal improvement in L hip/knee flexion during LLE advancement. Performed LLE PNF (see below for details) with improved L heel strike and less prominent LLE adduction during LLE swing. Trialed L FootUp brace, with no significant improvement in L toe drag noted.   Ambulation  Distance (Feet) 575 Feet   Assistive device Rolling walker   Gait Pattern Step-through pattern;Decreased hip/knee flexion - left;Left flexed knee in stance;Narrow base of support;Decreased dorsiflexion - left;Left foot flat;Poor foot clearance - left   Ambulation Surface Level;Indoor   Neuro Re-ed    Neuro Re-ed Details  Performed the following streches for LLE tone mangement: L quadriceps self-stretch (supine, hook lying with LLE off EOM) 2 x60-sec holds; supine manual prolonged passive stretch of the following 2 x60-sec holds each: L hamstrings, L gastroc, L soleus. Performed supine LLE PNF D2 flexion/extension x10 reps rhythmic initiated x8 reps resisted with slow reversal hold during D2 flexion; focused on LLE abduction during D2 extension and selective control of L ankle DF.                 PT Education - 03/19/15 2009    Education provided Yes   Education Details HEP: added hip flexor, hamstring strengthening; selective control of L ankle DF.   Person(s) Educated Patient   Methods Explanation;Demonstration;Verbal cues;Handout   Comprehension Verbalized understanding;Returned demonstration          PT Short Term Goals - 03/08/15 1224    PT SHORT TERM GOAL #1   Title Pt will initiate HEP for balance and strengthening in order to indicate decreased fall risk and improved functional mobility.  (Target Date: 04/05/15)   PT SHORT TERM GOAL #2   Title Pt will increase gait speed to 2.03 ft/ sec in order to indicate decreased fall risk and improved efficiency of gait.     PT SHORT TERM GOAL #3   Title Pt will increase BERG balance test to 42/56 in order to indicate decreased fall risk and improved functional balance.    PT SHORT TERM GOAL #4   Title Pt will ambulate >150' on indoor surfaces w/ SPC at mod I level in order to indicate safe home negotiation.     PT SHORT TERM GOAL #5   Title Pt will perform flight of stairs w/ single handrail in alternating pattern at mod I level in  order to indicate safe negotiation in home.     Additional Short Term Goals   Additional Short Term Goals Yes   PT SHORT TERM GOAL #6   Title Pt will verbalize fall prevention strategies independently in order to better decrease risk of falling at home.             PT Long Term Goals - 03/08/15 1228    PT LONG TERM GOAL #1   Title Pt will be independent with HEP for strengthening and balance in order to indicate improved functional mobility and decreased balance.  (Target Date: 05/02/14)   PT LONG TERM GOAL #2   Title Pt will increase gait speed to 2.63 ft/sec in order to indicate decreased fall risk and and indicate pt is safe community ambulator.     PT LONG TERM  GOAL #3   Title Pt will improve BERG balance test score to 46/56 in order to indicate decreased fall risk and improved functional balance.    PT LONG TERM GOAL #4   Title Pt will ambulate >500' without AD on paved outdoor surfaces and unlevel surfaces (grass) at mod I level in order to indicate safe community and outdoor home negotiation.     PT LONG TERM GOAL #5   Title Pt will indicate return to leisure activities in order to indicate safe return to community functions.                 Plan - 03/19/15 1017    Clinical Impression Statement Session focused on addressing origin of L toe drag during ambulation, assessing if pt may be an appropriate candidate for L AFO. Pt does exhibit spasticity in L > R LE (see note for details on specific muscle groups); however, gait pattern did not signficantly change with stretching during this session. L toe drag appeats to be associated with impaired LLE selective control during gait. No noted improvement with use of L FootUp brace.   Pt will benefit from skilled therapeutic intervention in order to improve on the following deficits Abnormal gait;Decreased activity tolerance;Decreased balance;Decreased coordination;Decreased endurance;Decreased mobility;Decreased strength;Impaired  perceived functional ability;Impaired sensation;Impaired UE functional use;Improper body mechanics;Postural dysfunction;Pain   Rehab Potential Excellent   PT Frequency 2x / week   PT Duration 8 weeks   PT Treatment/Interventions ADLs/Self Care Home Management;Electrical Stimulation;DME Instruction;Gait training;Stair training;Functional mobility training;Therapeutic exercise;Therapeutic activities;Balance training;Neuromuscular re-education;Patient/family education;Manual techniques   PT Next Visit Plan Continue to assess gait with LRAD. Gait training with cueing for upright posture. Address selective control in LLE. Exercises for posture strengthening.    Consulted and Agree with Plan of Care Patient        Problem List Patient Active Problem List   Diagnosis Date Noted  . Orthostatic hypotension 11/30/2014  . C6 spinal cord injury (HCC) 11/20/2014  . Neurogenic bowel 11/20/2014  . Neurogenic bladder 11/20/2014  . Fever of unknown origin   . Quadriplegia (HCC) 11/14/2014  . C5 vertebral fracture (HCC) 11/09/2014   Jorje Guild, PT, DPT [facility] [address] [place], [place], [address] Phone: [phone]   Fax:  [phone] 03/19/2015, 8:29 PM   Name: Carl [name] MRN: [id number] Date of Birth: [birth year]

## 2015-03-22 ENCOUNTER — Ambulatory Visit: Payer: Medicare Other | Admitting: Occupational Therapy

## 2015-03-22 ENCOUNTER — Encounter: Payer: Self-pay | Admitting: Occupational Therapy

## 2015-03-22 DIAGNOSIS — S14106A Unspecified injury at C6 level of cervical spinal cord, initial encounter: Secondary | ICD-10-CM | POA: Diagnosis not present

## 2015-03-22 DIAGNOSIS — M25532 Pain in left wrist: Secondary | ICD-10-CM

## 2015-03-22 DIAGNOSIS — R531 Weakness: Secondary | ICD-10-CM

## 2015-03-22 DIAGNOSIS — R279 Unspecified lack of coordination: Secondary | ICD-10-CM

## 2015-03-22 NOTE — Therapy (Signed)
[facility] [address] [place], [place], [address] Phone: [phone]   Fax:  [phone]  Occupational Therapy Treatment  Patient Details  Name: Carl [name] MRN: [id number] Date of Birth: July 22, 1950 Referring Provider: Faith Rogue  Encounter Date: 03/22/2015      OT End of Session - 03/22/15 1711    Visit Number 5   Number of Visits 17   Date for OT Re-Evaluation 05/02/14   Authorization Type MCR - G code needed   Authorization - Visit Number 5   Authorization - Number of Visits 10   OT Start Time 1318   OT Stop Time 1400   OT Time Calculation (min) 42 min   Equipment Utilized During Treatment fluidotherapy   Activity Tolerance Patient tolerated treatment well      Past Medical History  Diagnosis Date  . Bradycardia   . Chronic abdominal pain   . Hesitancy of micturition     Past Surgical History  Procedure Laterality Date  . Anterior cervical decomp/discectomy fusion N/A 11/14/2014    Procedure: Cervical Five-Cervical Six  Anterior Cervical Decompression/Diskectomy/Fusion;  Surgeon: Maeola Harman, MD;  Location: [facility];  Service: Neurosurgery;  Laterality: N/A;    There were no vitals filed for this visit.  Visit Diagnosis:  Left-sided weakness  Lack of coordination  Pain in left wrist      Subjective Assessment - 03/22/15 1319    Subjective  I've tried to work on my posture some   Patient Stated Goals increase strength and decrease tingling   Currently in Pain? Yes   Pain Score 2    Pain Location Arm   Pain Orientation Right;Left   Pain Descriptors / Indicators Throbbing;Tingling   Pain Type Chronic pain   Pain Onset More than a month ago   Pain Frequency Constant   Aggravating Factors  inactivity    Pain Relieving Factors activity, pain meds                      OT Treatments/Exercises (OP) - 03/22/15 0001    Hand Exercises   Other Hand Exercises Gripper set  at 35 lbs. resistance to pick up blocks Rt hand for sustained grip strength with min difficulty/fatigue. Decreased to 15 lbs resistance to pick up blocks with Lt hand with min to mod difficulty and repositioning required often for control of hand. Gripper slipped several times in Lt hand. (Small finger also not able to assist with gripping around gripper)   Fine Motor Coordination   Other Fine Motor Exercises Pt copying small peg design by placing small pegs in pegboard Lt hand with min difficulty and 1-2 drops only   LUE Fluidotherapy   Number Minutes Fluidotherapy 10 Minutes   LUE Fluidotherapy Location Hand;Wrist   Comments for pain management and desensitization Lt hand at beginning of session                  OT Short Term Goals - 03/16/15 1212    OT SHORT TERM GOAL #1   Title Pt will increase LUE gross grip to 30 lbs to help increase overall functional use and independence of L hand    Baseline 20lbs    Time 4   Period Weeks   Status On-going   OT SHORT TERM GOAL #2   Title Pt will be educated on trunk/core strengthening exercises to increase overall posture and strength in order to safely perform ADLs   Time 4  Period Weeks   Status On-going   OT SHORT TERM GOAL #3   Title Pt will be educated on desensitization exercises to decrease hypersensitivity to LUE    Time 4   Period Weeks   Status On-going   OT SHORT TERM GOAL #4   Title Pt will be independent with self correcting poor tecnique and compensatory strategies to left shoulder when functionally using LUE   Time 4   Period Weeks   Status On-going           OT Long Term Goals - 03/16/15 1212    OT LONG TERM GOAL #1   Title Pt will increase LUE gross grip to 40 lbs to help increase overall functional use and independence of L hand    Baseline 20lbs at baseline   Time 8   Period Weeks   Status On-going   OT LONG TERM GOAL #2   Title Pt will be independent with LB ADLs   Baseline min assist    Time 8    Period Weeks   Status On-going   OT LONG TERM GOAL #3   Title Pt will improve 9-hole peg test time to less than 55 seconds    Baseline 73 seconds   Time 8   Period Weeks   Status On-going   OT LONG TERM GOAL #4   Title Pt will improve left wrist strength to 4/5 strength    Baseline 2+/5 strength    Time 8   Period Weeks   Status On-going   OT LONG TERM GOAL #5   Title Pt will improve lateral and three pinch strength by at least 5lbs   Baseline 7lbs for both lateal and three pinch   Time 8   Period Weeks   Status On-going               Plan - 03/22/15 1712    Clinical Impression Statement Pt progressing with Lt hand function and coordination but decreased in hand manipulation and grip strength Lt hand   Plan perform in hand manipulation with medium sized pegs, postural control   OT Home Exercise Plan coordination and putty HEP, desensitization techniques 03/14/15, theraband HEP 03/16/15   Consulted and Agree with Plan of Care Patient        Problem List Patient Active Problem List   Diagnosis Date Noted  . Orthostatic hypotension 11/30/2014  . C6 spinal cord injury (HCC) 11/20/2014  . Neurogenic bowel 11/20/2014  . Neurogenic bladder 11/20/2014  . Fever of unknown origin   . Quadriplegia (HCC) 11/14/2014  . C5 vertebral fracture (HCC) 11/09/2014    Kelli ChurnBallie, Carolyne Whitsel Johnson, OTR/L 03/22/2015, 5:16 PM  [facility] [address] [place], [place], [address] Phone: [phone]   Fax:  [phone]  Name: Carl [name] MRN: [id number] Date of Birth: 12-28-1950

## 2015-03-28 ENCOUNTER — Encounter: Payer: Medicare Other | Attending: Physical Medicine & Rehabilitation | Admitting: Physical Medicine & Rehabilitation

## 2015-03-28 ENCOUNTER — Encounter: Payer: Self-pay | Admitting: Physical Medicine & Rehabilitation

## 2015-03-28 VITALS — BP 107/59 | HR 46 | Resp 12

## 2015-03-28 DIAGNOSIS — S14106S Unspecified injury at C6 level of cervical spinal cord, sequela: Secondary | ICD-10-CM | POA: Diagnosis present

## 2015-03-28 DIAGNOSIS — R001 Bradycardia, unspecified: Secondary | ICD-10-CM | POA: Insufficient documentation

## 2015-03-28 DIAGNOSIS — N319 Neuromuscular dysfunction of bladder, unspecified: Secondary | ICD-10-CM | POA: Diagnosis not present

## 2015-03-28 DIAGNOSIS — Z7901 Long term (current) use of anticoagulants: Secondary | ICD-10-CM | POA: Insufficient documentation

## 2015-03-28 DIAGNOSIS — G8929 Other chronic pain: Secondary | ICD-10-CM | POA: Insufficient documentation

## 2015-03-28 DIAGNOSIS — R109 Unspecified abdominal pain: Secondary | ICD-10-CM | POA: Diagnosis not present

## 2015-03-28 DIAGNOSIS — K592 Neurogenic bowel, not elsewhere classified: Secondary | ICD-10-CM | POA: Insufficient documentation

## 2015-03-28 DIAGNOSIS — S12400A Unspecified displaced fracture of fifth cervical vertebra, initial encounter for closed fracture: Secondary | ICD-10-CM | POA: Insufficient documentation

## 2015-03-28 DIAGNOSIS — R3911 Hesitancy of micturition: Secondary | ICD-10-CM | POA: Diagnosis not present

## 2015-03-28 DIAGNOSIS — G8250 Quadriplegia, unspecified: Secondary | ICD-10-CM | POA: Diagnosis not present

## 2015-03-28 MED ORDER — GABAPENTIN 100 MG PO CAPS: 200.0000 mg | ORAL_CAPSULE | Freq: Three times a day (TID) | ORAL | Status: DC

## 2015-03-28 NOTE — Progress Notes (Signed)
Subjective:    Patient ID: Carl [name], male    DOB: 1950-09-30, 64 y.o.   MRN: [id number]  HPI   [name] is here regarding his ASIA C6  SCI. He is moving better but still is having to self-cath every day. He has his bowels are working with an AM program but he has increased control and awareness of them.  He is using baclofen for spasms which works but the full tablet makes him sleepy. He's on gabapentin for this dysesthesias. He typically takes  TID. Sometimes he takes a 4th cap.   He is using his walker for ambulation. He tries his cane too. 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   Pain Inventory Average Pain 3 Pain Right Now 2 My pain is constant and dull  In the last 24 hours, has pain interfered with the following? General activity 3 Relation with others 0 Enjoyment of life 4 What TIME of day is your pain at its worst? night Sleep (in general) NA  Pain is worse with: inactivity and some activites Pain improves with: therapy/exercise and medication Relief from Meds: NA  Mobility use a walker how many minutes can you walk? 15 ability to climb steps?  yes do you drive?  no use a wheelchair Do you have any goals in this area?  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  no  Physicians involved in your care Primary care . Neurologist .   Family History  Problem Relation Age of Onset  . Heart attack Father   . Alzheimer's disease Mother   . Aneurysm Brother     brain   Social History   Social History  . Marital Status: Married    Spouse Name: N/A  . Number of Children: N/A  . Years of Education: N/A   Occupational History  . Pastor    Social History Main Topics  . Smoking status: Former Games developer  . Smokeless tobacco: None  .  Alcohol Use: None  . Drug Use: None  . Sexual Activity: Not Asked   Other Topics Concern  . None   Social History Narrative   Past Surgical History  Procedure Laterality Date  . Anterior cervical decomp/discectomy fusion N/A 11/14/2014    Procedure: Cervical Five-Cervical Six  Anterior Cervical Decompression/Diskectomy/Fusion;  Surgeon: Maeola Harman, MD;  Location: [facility];  Service: Neurosurgery;  Laterality: N/A;   Past Medical History  Diagnosis Date  . Bradycardia   . Chronic abdominal pain   . Hesitancy of micturition    BP 107/59 mmHg  Pulse 46  Resp 12  SpO2 98%  Opioid Risk Score:   Fall Risk Score:  `1  Depression screen PHQ 2/9  Depression screen PHQ 2/9 01/09/2015  Decreased Interest 0  Down, Depressed, Hopeless 0  PHQ - 2 Score 0  Altered sleeping 0  Tired, decreased energy 1  Change in appetite 1  Feeling bad or failure about yourself  0  Trouble concentrating 0  Moving slowly or fidgety/restless 0  Suicidal thoughts 0  PHQ-9 Score 2      Review of Systems  Musculoskeletal: Positive for gait problem.  Neurological: Positive for weakness and numbness.       Tingling spasms  All other systems reviewed and are negative.  Objective:   Physical Exam  Constitutional: He is oriented to person, place, and time. He appears well-developed and well-nourished.  HENT:  Head: Normocephalic and atraumatic.  Eyes: Conjunctivae are normal. Pupils are equal, round, and reactive to light.  Neck:  C Collar in place  Cardiovascular: bradycardia and regular rhythm.  Respiratory: Effort normal. No respiratory distress. He has no wheezes.  GI: Soft. Bowel sounds +.  Genitourinary: foley out Musculoskeletal: He exhibits no edema.  Neurological: He is alert and oriented to person, place, and time. No cranial nerve deficit. Coordination normal.  Patient has normal sensation proximal to C6 dermatome. Reduced PP/LT from C6 through sacral levels Patient has  decreased tone in bilateral Lower extremities 4/5 bilateral deltoids, biceps, 4-triceps, 4 wrist extensors, 4/5 right HI, 3/5 left HI, right HF, KE and ADF/APF 3+ to 4-/5. 3 to 3+/5 left hip flexors, 4/5 knee extensors 3+/4- ankle dorsiflexors and plantar flexors, left weaker than right. Mild steppage gait LLE. Otherwise gait much improved. Skin: Skin is warm and dry.  Nail beds ?yellow  Psych: still quiet but alert    Assessment/Plan:  1. Functional deficits secondary to C6 ASIA C incomplete tetraplegia, spinal cord injury due to fall with C5-C6 fracture and neurogenic bowel and bladder  -continue outpt therapies and neuro rehab  2. For spasms: continue baclofen but at half dose (5mg ) at bedtime. HEP 3. Pain Management: gabapentin 100mg ---increase to 200mg  TID after titration.  - tramadol prn.  [facility] 4. Mood: improved  5. Neurogenic bladder---I/O caths-  -continue to keep volumes 300-500cc  -urology follow up as scheduled of UDS  6. Neurogenic bowel: senna in pm and fleet enema qam--more spontaneous now.  7. BP: dc florinef Follow up in 3 months.  Thirty minutes of face to face patient care time were spent during this visit. All questions were encouraged and answered.

## 2015-03-28 NOTE — Patient Instructions (Addendum)
PLEASE CALL ME WITH ANY PROBLEMS OR QUESTIONS (#[phone]). HAVE A HAPPY HOLIDAY SEASON!!!   GABAPENTIN: WEEK 1---100-100-200MG  WEEK 1---200-200-200MG  WEEK  3+   200MG  3X DAILY

## 2015-03-29 ENCOUNTER — Encounter: Payer: Self-pay | Admitting: Rehabilitation

## 2015-03-29 ENCOUNTER — Ambulatory Visit: Payer: Medicare Other | Admitting: Rehabilitation

## 2015-03-29 ENCOUNTER — Ambulatory Visit: Payer: Medicare Other | Admitting: *Deleted

## 2015-03-29 DIAGNOSIS — R29898 Other symptoms and signs involving the musculoskeletal system: Secondary | ICD-10-CM

## 2015-03-29 DIAGNOSIS — M25532 Pain in left wrist: Secondary | ICD-10-CM

## 2015-03-29 DIAGNOSIS — R531 Weakness: Secondary | ICD-10-CM

## 2015-03-29 DIAGNOSIS — R279 Unspecified lack of coordination: Secondary | ICD-10-CM

## 2015-03-29 DIAGNOSIS — R2681 Unsteadiness on feet: Secondary | ICD-10-CM

## 2015-03-29 DIAGNOSIS — R269 Unspecified abnormalities of gait and mobility: Secondary | ICD-10-CM

## 2015-03-29 DIAGNOSIS — S14106A Unspecified injury at C6 level of cervical spinal cord, initial encounter: Secondary | ICD-10-CM | POA: Diagnosis not present

## 2015-03-29 NOTE — Therapy (Signed)
[facility] [address] [place], [place], [address] Phone: [phone]   Fax:  [phone]  Physical Therapy Treatment  Patient Details  Name: Carl [name] MRN: [id number] Date of Birth: 19-Nov-1950 Referring Provider: Faith Rogue, MD  Encounter Date: 03/29/2015      PT End of Session - 03/29/15 1025    Visit Number 5   Number of Visits 17   Date for PT Re-Evaluation 05/07/15   Authorization Type MCR trad primary, BCBS secondary    PT Start Time 1015   PT Stop Time 1100   PT Time Calculation (min) 45 min   Activity Tolerance Patient tolerated treatment well   Behavior During Therapy [facility] for tasks assessed/performed      Past Medical History  Diagnosis Date  . Bradycardia   . Chronic abdominal pain   . Hesitancy of micturition     Past Surgical History  Procedure Laterality Date  . Anterior cervical decomp/discectomy fusion N/A 11/14/2014    Procedure: Cervical Five-Cervical Six  Anterior Cervical Decompression/Diskectomy/Fusion;  Surgeon: Maeola Harman, MD;  Location: [facility];  Service: Neurosurgery;  Laterality: N/A;    There were no vitals filed for this visit.  Visit Diagnosis:  Abnormality of gait  Unsteadiness  Generalized weakness  Decreased strength of trunk and back      Subjective Assessment - 03/29/15 1023    Subjective Reports seeing Dr. Riley Kill prior to this visit, otherwise no changes, no falls.     Patient is accompained by: Family member   Limitations Walking;Standing;House hold activities;Sitting;Lifting   Patient Stated Goals "to walk without this walker"   Currently in Pain? Yes   Pain Score 2    Pain Location Arm   Pain Orientation Right;Left  L more than R   Pain Descriptors / Indicators Throbbing;Tingling   Pain Type Acute pain   Pain Radiating Towards into both hands   Pain Onset More than a month ago   Pain Frequency Constant   Aggravating Factors  inactivity    Pain Relieving Factors activity and pain meds               Gait:  Assessed gait with RW and SPC with use of L PLS AFO for foot clearance and safety during gait.  Performed 230' with RW and L AFO at mod I level with improvement noted in foot clearance.  Note with increased fatigue, he did begin to have slight toe drag, however did seem an improvement vs no AFO.  Then continued to assess with use of SPC x 230' at S level with min cues for posture and sequencing.  Pt states that he has been using cane at home sometimes when wife is at home and sometimes when she is not at home.  Discussed safety concerns with pt and using SPC at mod I level.  Feel that there are still balance concerns and would like for pt to have S from wife when ambulating with cane at home and will continue to work on in therapy.  Pt verbalized understanding.    TE:  Continue to address posture during session with supine and seated exercises.  Provided pt with exercises/stretching to perform at home to carryover to improved posture during gait and all upright mobility.  See pt instruction for details.                    PT Education - 03/29/15 1025    Education provided Yes  Education Details additions to HEP as well as to split exercises up for balance on one day and strength the next, education to have S from wife for gait with cane at home.    Person(s) Educated Patient   Methods Explanation   Comprehension Verbalized understanding          PT Short Term Goals - 03/08/15 1224    PT SHORT TERM GOAL #1   Title Pt will initiate HEP for balance and strengthening in order to indicate decreased fall risk and improved functional mobility.  (Target Date: 04/05/15)   PT SHORT TERM GOAL #2   Title Pt will increase gait speed to 2.03 ft/ sec in order to indicate decreased fall risk and improved efficiency of gait.     PT SHORT TERM GOAL #3   Title Pt will increase BERG balance test to 42/56 in order to  indicate decreased fall risk and improved functional balance.    PT SHORT TERM GOAL #4   Title Pt will ambulate >150' on indoor surfaces w/ SPC at mod I level in order to indicate safe home negotiation.     PT SHORT TERM GOAL #5   Title Pt will perform flight of stairs w/ single handrail in alternating pattern at mod I level in order to indicate safe negotiation in home.     Additional Short Term Goals   Additional Short Term Goals Yes   PT SHORT TERM GOAL #6   Title Pt will verbalize fall prevention strategies independently in order to better decrease risk of falling at home.             PT Long Term Goals - 03/08/15 1228    PT LONG TERM GOAL #1   Title Pt will be independent with HEP for strengthening and balance in order to indicate improved functional mobility and decreased balance.  (Target Date: 05/02/14)   PT LONG TERM GOAL #2   Title Pt will increase gait speed to 2.63 ft/sec in order to indicate decreased fall risk and and indicate pt is safe community ambulator.     PT LONG TERM GOAL #3   Title Pt will improve BERG balance test score to 46/56 in order to indicate decreased fall risk and improved functional balance.    PT LONG TERM GOAL #4   Title Pt will ambulate >500' without AD on paved outdoor surfaces and unlevel surfaces (grass) at mod I level in order to indicate safe community and outdoor home negotiation.     PT LONG TERM GOAL #5   Title Pt will indicate return to leisure activities in order to indicate safe return to community functions.                 Plan - 03/29/15 1025    Clinical Impression Statement Skilled session focused on assessment of gait with use of L PLS AFO for foot clearance.  Note that this brace did help somewhat, however he continued to have slight toe drag, esp with increased fatigue.  Will continue to assess.  Also provided stretching for pectoral/trunk, as well as stretching for cervical lateral flexors to carryover to imporved postural  control during gait.     Pt will benefit from skilled therapeutic intervention in order to improve on the following deficits Abnormal gait;Decreased activity tolerance;Decreased balance;Decreased coordination;Decreased endurance;Decreased mobility;Decreased strength;Impaired perceived functional ability;Impaired sensation;Impaired UE functional use;Improper body mechanics;Postural dysfunction;Pain   Rehab Potential Excellent   PT Frequency 2x / week   PT Duration  8 weeks   PT Treatment/Interventions ADLs/Self Care Home Management;Electrical Stimulation;DME Instruction;Gait training;Stair training;Functional mobility training;Therapeutic exercise;Therapeutic activities;Balance training;Neuromuscular re-education;Patient/family education;Manual techniques   PT Next Visit Plan Continue to assess gait with SPC (start to transition to outdoors as well as indoors). Gait training with cueing for upright posture. Address selective control in LLE. Exercises for posture strengthening.    Consulted and Agree with Plan of Care Patient        Problem List Patient Active Problem List   Diagnosis Date Noted  . Orthostatic hypotension 11/30/2014  . C6 spinal cord injury (HCC) 11/20/2014  . Neurogenic bowel 11/20/2014  . Neurogenic bladder 11/20/2014  . Fever of unknown origin   . Quadriplegia (HCC) 11/14/2014  . C5 vertebral fracture (HCC) 11/09/2014    Harriet Butte, PT, MPT [facility] [address] [place], [place], [address] Phone: [phone]   Fax:  [phone] 03/29/2015, 12:26 PM  Name: Carl [name] MRN: [id number] Date of Birth: 23-Oct-1950

## 2015-03-29 NOTE — Patient Instructions (Signed)
   Shoulder Shrug + Rotation  In a sitting position, bring your shoulders vertically up toward your ears in a shrugging motion.  "In a sitting position, bring your shoulders vertically up toward your ears in a shrugging motion. Once at the top of the movement, roll both shoulders backward to open the chest, Be sure to maintain your head position directly over your body, with your face level and without jutting forward."  Perform x 10 reps slowly  Lateral Flexion    With head in comfortable, centered position and chin slightly tucked, gently bring right ear toward right shoulder. Hold __60__ seconds. Repeat with left side.  Can increase stretch by putting opposite arm behind back and use other hand to self assist stretch.   Repeat __2__ times.  http://gt2.exer.us/8   Copyright  VHI. All rights reserved.     Supine Pectoralis Stretch [facility]  - Patient lays supine with a towel roll aligned under spine -Patient slowly brings her arms into 90 deg horizontal abduction and allows arms to lower to floor (if possible). Keep legs straight-2 sets

## 2015-03-29 NOTE — Therapy (Signed)
[facility] [address] [place], [place], [address] Phone: [phone]   Fax:  [phone]  Occupational Therapy Treatment  Patient Details  Name: Carl [name] MRN: [id number] Date of Birth: Dec 10, 1950 Referring Provider: Faith Rogue  Encounter Date: 03/29/2015      OT End of Session - 03/29/15 1135    Visit Number 6   Number of Visits 17   Date for OT Re-Evaluation 05/02/14   Authorization Type MCR - G code needed   Authorization - Visit Number 6   Authorization - Number of Visits 10   OT Start Time 1100   OT Stop Time 1145   OT Time Calculation (min) 45 min   Equipment Utilized During Treatment fluidotherapy   Activity Tolerance Patient tolerated treatment well   Behavior During Therapy [facility] for tasks assessed/performed      Past Medical History  Diagnosis Date  . Bradycardia   . Chronic abdominal pain   . Hesitancy of micturition     Past Surgical History  Procedure Laterality Date  . Anterior cervical decomp/discectomy fusion N/A 11/14/2014    Procedure: Cervical Five-Cervical Six  Anterior Cervical Decompression/Diskectomy/Fusion;  Surgeon: Maeola Harman, MD;  Location: [facility];  Service: Neurosurgery;  Laterality: N/A;    There were no vitals filed for this visit.  Visit Diagnosis:  Left-sided weakness  Lack of coordination  Pain in left wrist  Decreased strength of trunk and back  Decreased grip strength of left hand  Decreased pinch strength      Subjective Assessment - 03/29/15 1109    Subjective  I feel okay today, my left hand doesn't seem to be tingling as much    Patient Stated Goals increase strength and decrease tingling   Currently in Pain? Yes   Pain Score 2    Pain Location Arm   Pain Orientation Right;Left   Pain Descriptors / Indicators Throbbing;Tingling   Pain Type Acute pain   Pain Radiating Towards shoulder to hands    Pain Onset More than a month ago   Pain Frequency Constant   Aggravating Factors  inactivity, normally worse when he wakes up   Pain Relieving Factors activity and pain meds    Multiple Pain Sites No                      OT Treatments/Exercises (OP) - 03/29/15 0001    LUE Fluidotherapy   Number Minutes Fluidotherapy 10 Minutes   LUE Fluidotherapy Location Hand;Wrist   Comments for pain management and desensitization to bilateral hands at beginning of session       Therapeutic activity focusing on in-hand manipulation using pennies in right hand and medium pegs in left hand, pincer grasp activity using left hand focusing on patient not using shoulder compensatory techniques for this.  During all activity focused on posture and positioning, pt required verbal cues for correct technique and posture.   Lt hand activities were of moderate difficulty for patient. Pt states pain in UEs decreased after OT session.          OT Education - 03/29/15 1128    Education provided Yes   Education Details continue desensitization techniques at home and continue HEP   Person(s) Educated Patient   Methods Explanation   Comprehension Verbalized understanding          OT Short Term Goals - 03/29/15 1130    OT SHORT TERM GOAL #1   Title Pt will increase  LUE gross grip to 30 lbs to help increase overall functional use and independence of L hand    Baseline 20lbs    Time 4   Period Weeks   Status On-going   OT SHORT TERM GOAL #2   Title Pt will be educated on trunk/core strengthening exercises to increase overall posture and strength in order to safely perform ADLs   Time 4   Period Weeks   Status On-going   OT SHORT TERM GOAL #3   Title Pt will be educated on desensitization exercises to decrease hypersensitivity to LUE    Time 4   Period Weeks   Status On-going   OT SHORT TERM GOAL #4   Title Pt will be independent with self correcting poor tecnique and compensatory strategies to left shoulder when  functionally using LUE   Time 4   Period Weeks   Status On-going           OT Long Term Goals - 03/29/15 1130    OT LONG TERM GOAL #1   Title Pt will increase LUE gross grip to 40 lbs to help increase overall functional use and independence of L hand    Baseline 20lbs at baseline   Time 8   Period Weeks   Status On-going   OT LONG TERM GOAL #2   Title Pt will be independent with LB ADLs   Baseline min assist    Time 8   Period Weeks   Status On-going   OT LONG TERM GOAL #3   Title Pt will improve 9-hole peg test time to less than 55 seconds    Baseline 73 seconds   Time 8   Period Weeks   Status On-going   OT LONG TERM GOAL #4   Title Pt will improve left wrist strength to 4/5 strength    Baseline 2+/5 strength    Time 8   Period Weeks   Status On-going   OT LONG TERM GOAL #5   Title Pt will improve lateral and three pinch strength by at least 5lbs   Baseline 7lbs for both lateal and three pinch   Time 8   Period Weeks   Status On-going               Plan - 03/29/15 1131    Clinical Impression Statement Pt continues to progress, needing increased assistance and time with in-hand manipulation using left hand. Pt continues to be motivated and eager to increase functional use and decrease pain/tingling in left hand.    Pt will benefit from skilled therapeutic intervention in order to improve on the following deficits (Retired) Decreased activity tolerance;Decreased balance;Decreased coordination;Decreased endurance;Decreased mobility;Decreased range of motion;Decreased safety awareness;Decreased skin integrity;Decreased strength;Increased edema;Impaired UE functional use;Pain;Improper body mechanics;Increased muscle spasms;Impaired sensation   Rehab Potential Excellent   Clinical Impairments Affecting Rehab Potential None known at this time   OT Frequency 2x / week   OT Duration 8 weeks   OT Treatment/Interventions Self-care/ADL training;Electrical  Stimulation;Moist Heat;Traction;Therapeutic exercise;Neuromuscular education;Energy conservation;Functional Mobility Training;Passive range of motion;Therapeutic exercises;Therapeutic activities;Patient/family education;Balance training;Fluidtherapy   OT Home Exercise Plan fludiotherapy at beginning of session (I had patient do bilateral UEs), coordination and putty HEP, desensitization techniques 03/14/15, theraband HEP 03/16/15   Consulted and Agree with Plan of Care Patient        Problem List Patient Active Problem List   Diagnosis Date Noted  . Orthostatic hypotension 11/30/2014  . C6 spinal cord injury (HCC) 11/20/2014  . Neurogenic bowel  11/20/2014  . Neurogenic bladder 11/20/2014  . Fever of unknown origin   . Quadriplegia (HCC) 11/14/2014  . C5 vertebral fracture (HCC) 11/09/2014    Edwin CapPatricia Lamarr Feenstra , MS, OTR/L, CLT Pager: [phone]  03/29/2015, 11:49 AM  [facility] [address] [place], [place], [address] Phone: [phone]   Fax:  [phone]  Name: Carl Deedsony [name] MRN: [id number] Date of Birth: 1950-11-11

## 2015-04-05 ENCOUNTER — Ambulatory Visit: Payer: Medicare Other | Admitting: Occupational Therapy

## 2015-04-05 ENCOUNTER — Ambulatory Visit: Payer: Medicare Other | Admitting: Rehabilitation

## 2015-04-05 DIAGNOSIS — R29898 Other symptoms and signs involving the musculoskeletal system: Secondary | ICD-10-CM

## 2015-04-05 DIAGNOSIS — S14106A Unspecified injury at C6 level of cervical spinal cord, initial encounter: Secondary | ICD-10-CM | POA: Diagnosis not present

## 2015-04-05 DIAGNOSIS — R2681 Unsteadiness on feet: Secondary | ICD-10-CM

## 2015-04-05 DIAGNOSIS — R6889 Other general symptoms and signs: Secondary | ICD-10-CM

## 2015-04-05 DIAGNOSIS — R531 Weakness: Secondary | ICD-10-CM

## 2015-04-05 DIAGNOSIS — R269 Unspecified abnormalities of gait and mobility: Secondary | ICD-10-CM

## 2015-04-05 NOTE — Therapy (Signed)
[facility] [address] [place], [place], [address] Phone: [phone]   Fax:  [phone]  Physical Therapy Treatment  Patient Details  Name: Carl [name] MRN: [id number] Date of Birth: 07/31/1950 Referring Provider: Faith Rogue, MD  Encounter Date: 04/05/2015      PT End of Session - 04/05/15 1329    Visit Number 6   Number of Visits 17   Date for PT Re-Evaluation 05/07/15   Authorization Type MCR trad primary, BCBS secondary    PT Start Time 1319  therapist late   PT Stop Time 1400   PT Time Calculation (min) 41 min   Activity Tolerance Patient tolerated treatment well   Behavior During Therapy [facility] for tasks assessed/performed      Past Medical History  Diagnosis Date  . Bradycardia   . Chronic abdominal pain   . Hesitancy of micturition     Past Surgical History  Procedure Laterality Date  . Anterior cervical decomp/discectomy fusion N/A 11/14/2014    Procedure: Cervical Five-Cervical Six  Anterior Cervical Decompression/Diskectomy/Fusion;  Surgeon: Maeola Harman, MD;  Location: [facility];  Service: Neurosurgery;  Laterality: N/A;    There were no vitals filed for this visit.  Visit Diagnosis:  Abnormality of gait  Unsteadiness  Generalized weakness  Decreased strength of trunk and back  Decreased functional activity tolerance      Subjective Assessment - 04/05/15 1327    Subjective "Today is the best I've felt in the past few days, I've been so stiff.  I've been trying to sleep in my bed a little more, but it just makes me so stiff."    Limitations Walking;Standing;House hold activities;Sitting;Lifting   Patient Stated Goals "to walk without this walker"   Currently in Pain? Yes   Pain Score 3    Pain Location Arm   Pain Orientation Right;Left   Pain Descriptors / Indicators Throbbing;Tingling   Pain Type Chronic pain   Pain Radiating Towards shoulders and hands   Pain Onset More  than a month ago   Pain Frequency Constant   Aggravating Factors  inactivity, worse when he wakes up   Pain Relieving Factors activity, pain meds               Gait:  Continue to assess gait on indoor surfaces with use of SPC to better assess home enviornment.  Had pt ambulate over carpet, level surfaces, over thresholds and around obstacles.  Pt able to complete at mod I level and continue to feel that he is safe to ambulate at home with [facility].  Then assessed gait on paved outdoor surfaces with SPC x 400' as well as curb/ramp in doors and outdoors with [facility].  Performed outdoor gait at min/guard level with cues for posture, increased L hip protraction during stance, and increased L hip/knee flex.  Note marked improvement with foot clearance today.   NMR:  Note that during gait, pt tends to keep L hip retracted and at times performs hip hike to compensate for LLE weakness.  Progressed to supine LLE only bridging.  Had pt lie with LLE off edge of mat on block with RLE in bent position initially due to increased difficulty of activating L glute med/max.  Performed x 10 reps.  Progressed activity with PT holding RLE to decrease amount of use and isolate LLE.  Performed 2 sets of 10 reps with activation of L glute, however decreased range due to weakness.  Encouraged pt to perform  this task at home, along with clam shell task (already has on HEP).    TA:  Continue to assess pt getting in/out of bed due to increased stiffness.  Recommended he sleep with pillow under knees, however he states that this feels as though it "cuts off circulation."   Also recommended that he perform stretching prior to getting up, however this also increased spasms.  Pt getting out of bed without log roll, therefore provided education on log roll technique.  Pt verbalizing that he feels it is easier to lower legs out of bed and then use arms to sit up.  Will continue to address in therapy.                      PT Education - 04/05/15 1329    Education provided Yes   Education Details compliance with HEP, gait indoors with SPC, and education/problem solving in order to have pt more comfortably sleep in his bed vs hospital bed.    Person(s) Educated Patient   Methods Explanation   Comprehension Verbalized understanding          PT Short Term Goals - 03/08/15 1224    PT SHORT TERM GOAL #1   Title Pt will initiate HEP for balance and strengthening in order to indicate decreased fall risk and improved functional mobility.  (Target Date: 04/05/15)   PT SHORT TERM GOAL #2   Title Pt will increase gait speed to 2.03 ft/ sec in order to indicate decreased fall risk and improved efficiency of gait.     PT SHORT TERM GOAL #3   Title Pt will increase BERG balance test to 42/56 in order to indicate decreased fall risk and improved functional balance.    PT SHORT TERM GOAL #4   Title Pt will ambulate >150' on indoor surfaces w/ SPC at mod I level in order to indicate safe home negotiation.     PT SHORT TERM GOAL #5   Title Pt will perform flight of stairs w/ single handrail in alternating pattern at mod I level in order to indicate safe negotiation in home.     Additional Short Term Goals   Additional Short Term Goals Yes   PT SHORT TERM GOAL #6   Title Pt will verbalize fall prevention strategies independently in order to better decrease risk of falling at home.             PT Long Term Goals - 03/08/15 1228    PT LONG TERM GOAL #1   Title Pt will be independent with HEP for strengthening and balance in order to indicate improved functional mobility and decreased balance.  (Target Date: 05/02/14)   PT LONG TERM GOAL #2   Title Pt will increase gait speed to 2.63 ft/sec in order to indicate decreased fall risk and and indicate pt is safe community ambulator.     PT LONG TERM GOAL #3   Title Pt will improve BERG balance test score to 46/56 in order to indicate decreased fall risk and improved  functional balance.    PT LONG TERM GOAL #4   Title Pt will ambulate >500' without AD on paved outdoor surfaces and unlevel surfaces (grass) at mod I level in order to indicate safe community and outdoor home negotiation.     PT LONG TERM GOAL #5   Title Pt will indicate return to leisure activities in order to indicate safe return to community functions.  Plan - 04/05/15 1329    Clinical Impression Statement Skilled session focused on gait indoors and outdoors with [employer] to address STGs as well as for improved quality.  Note decreased foot drag today, but also note some L hip hike compensation as well as decreased L glute med/max activation during L stance, therefore performed LLE only bridging to address.  Also addressed bed mobility to improve comfort.    Pt will benefit from skilled therapeutic intervention in order to improve on the following deficits Abnormal gait;Decreased activity tolerance;Decreased balance;Decreased coordination;Decreased endurance;Decreased mobility;Decreased strength;Impaired perceived functional ability;Impaired sensation;Impaired UE functional use;Improper body mechanics;Postural dysfunction;Pain   Rehab Potential Excellent   PT Frequency 2x / week   PT Duration 8 weeks   PT Treatment/Interventions ADLs/Self Care Home Management;Electrical Stimulation;DME Instruction;Gait training;Stair training;Functional mobility training;Therapeutic exercise;Therapeutic activities;Balance training;Neuromuscular re-education;Patient/family education;Manual techniques   PT Next Visit Plan Continue to assess gait with SPC (start to transition to outdoors as well as indoors). Gait training with cueing for upright posture. Address selective control in LLE. Exercises for posture strengthening/control, trunk strengthening   Consulted and Agree with Plan of Care Patient        Problem List Patient Active Problem List   Diagnosis Date Noted  . Orthostatic  hypotension 11/30/2014  . C6 spinal cord injury (HCC) 11/20/2014  . Neurogenic bowel 11/20/2014  . Neurogenic bladder 11/20/2014  . Fever of unknown origin   . Quadriplegia (HCC) 11/14/2014  . C5 vertebral fracture (HCC) 11/09/2014    Harriet ButteEmily Analiza Cowger, PT, MPT [facility] [address] [place], [place], [address] Phone: [phone]   Fax:  [phone] 04/05/2015, 2:37 PM  Name: Benancio Deedsony Talamante MRN: [id number] Date of Birth: 1951/03/19

## 2015-04-05 NOTE — Therapy (Signed)
[facility] [address] [place], [place], [address] Phone: [phone]   Fax:  [phone]  Occupational Therapy Treatment  Patient Details  Name: Carl [name] MRN: [id number] Date of Birth: Aug 16, 1950 Referring Provider: Faith RogueZachary Swartz  Encounter Date: 04/05/2015      OT End of Session - 04/05/15 1506    Visit Number 7   Number of Visits 17   Date for OT Re-Evaluation 05/02/14   Authorization Type MCR - G code needed   Authorization - Visit Number 7   Authorization - Number of Visits 10   OT Start Time 1400   OT Stop Time 1445   OT Time Calculation (min) 45 min   Activity Tolerance Patient tolerated treatment well      Past Medical History  Diagnosis Date  . Bradycardia   . Chronic abdominal pain   . Hesitancy of micturition     Past Surgical History  Procedure Laterality Date  . Anterior cervical decomp/discectomy fusion N/A 11/14/2014    Procedure: Cervical Five-Cervical Six  Anterior Cervical Decompression/Diskectomy/Fusion;  Surgeon: Maeola HarmanJoseph Stern, MD;  Location: [facility];  Service: Neurosurgery;  Laterality: N/A;    There were no vitals filed for this visit.  Visit Diagnosis:  Decreased strength of trunk and back      Subjective Assessment - 04/05/15 1406    Patient Stated Goals increase strength and decrease tingling   Currently in Pain? Yes   Pain Score 3    Pain Location Arm   Pain Orientation Right;Left   Pain Descriptors / Indicators Throbbing;Tingling   Pain Type Chronic pain   Pain Radiating Towards shoulders and hands   Pain Onset More than a month ago   Pain Frequency Constant   Aggravating Factors  worse in am and late at night   Pain Relieving Factors activity, pain meds         TREATMENT:  Focus of treatment session today was postural flexibility and control. Towel stretch supine for trunk extension, accompanied by bridging exercises for anterior pelvic trunk control.  Pt instructed to perform towel stretch at home for 5-10 minutes 2x/day. Progressed to sitting on physioball with postural ex's including: anterior pelvic tilt with trunk extension as able, trunk rotation bilaterally, lateral trunk flexion bilaterally, and righting reactions to activate core. Pt then moved into seated position on mat for increased active stretching with back over ball. Progressed to supine stretch with foam noodle and anterior pelvic stretches. At end of session, performed seated bridging activity for trunk extension, scapula retraction, bilateral shoulder extension and ER, and anterior pelvic movement.                        OT Short Term Goals - 03/29/15 1130    OT SHORT TERM GOAL #1   Title Pt will increase LUE gross grip to 30 lbs to help increase overall functional use and independence of L hand    Baseline 20lbs    Time 4   Period Weeks   Status On-going   OT SHORT TERM GOAL #2   Title Pt will be educated on trunk/core strengthening exercises to increase overall posture and strength in order to safely perform ADLs   Time 4   Period Weeks   Status On-going   OT SHORT TERM GOAL #3   Title Pt will be educated on desensitization exercises to decrease hypersensitivity to LUE    Time 4   Period Weeks  Status On-going   OT SHORT TERM GOAL #4   Title Pt will be independent with self correcting poor tecnique and compensatory strategies to left shoulder when functionally using LUE   Time 4   Period Weeks   Status On-going           OT Long Term Goals - 03/29/15 1130    OT LONG TERM GOAL #1   Title Pt will increase LUE gross grip to 40 lbs to help increase overall functional use and independence of L hand    Baseline 20lbs at baseline   Time 8   Period Weeks   Status On-going   OT LONG TERM GOAL #2   Title Pt will be independent with LB ADLs   Baseline min assist    Time 8   Period Weeks   Status On-going   OT LONG TERM GOAL #3   Title Pt  will improve 9-hole peg test time to less than 55 seconds    Baseline 73 seconds   Time 8   Period Weeks   Status On-going   OT LONG TERM GOAL #4   Title Pt will improve left wrist strength to 4/5 strength    Baseline 2+/5 strength    Time 8   Period Weeks   Status On-going   OT LONG TERM GOAL #5   Title Pt will improve lateral and three pinch strength by at least 5lbs   Baseline 7lbs for both lateal and three pinch   Time 8   Period Weeks   Status On-going               Plan - 04/05/15 1506    Clinical Impression Statement Pt still remains very stiff in trunk and max difficulty disassociating trunk and pelvic movements. Pt has limited trunk extension and scapula retraction and compensates with shoulder hiking.    Plan continue postural flexibility and control, coordination, fluido for bilateral hands if time allows   Consulted and Agree with Plan of Care Patient        Problem List Patient Active Problem List   Diagnosis Date Noted  . Orthostatic hypotension 11/30/2014  . C6 spinal cord injury (HCC) 11/20/2014  . Neurogenic bowel 11/20/2014  . Neurogenic bladder 11/20/2014  . Fever of unknown origin   . Quadriplegia (HCC) 11/14/2014  . C5 vertebral fracture (HCC) 11/09/2014    Kelli Churn, OTR/L 04/05/2015, 3:13 PM  [facility] [address] [place], [place], [address] Phone: [phone]   Fax:  [phone]  Name: Carl [name] MRN: [id number] Date of Birth: [birth year]

## 2015-04-06 ENCOUNTER — Encounter: Payer: Self-pay | Admitting: Occupational Therapy

## 2015-04-06 ENCOUNTER — Encounter: Payer: Self-pay | Admitting: Rehabilitation

## 2015-04-06 ENCOUNTER — Ambulatory Visit: Payer: Medicare Other | Admitting: Rehabilitation

## 2015-04-06 ENCOUNTER — Ambulatory Visit: Payer: Medicare Other | Admitting: Occupational Therapy

## 2015-04-06 DIAGNOSIS — R279 Unspecified lack of coordination: Secondary | ICD-10-CM

## 2015-04-06 DIAGNOSIS — R2681 Unsteadiness on feet: Secondary | ICD-10-CM

## 2015-04-06 DIAGNOSIS — R531 Weakness: Secondary | ICD-10-CM

## 2015-04-06 DIAGNOSIS — R6889 Other general symptoms and signs: Secondary | ICD-10-CM

## 2015-04-06 DIAGNOSIS — M25532 Pain in left wrist: Secondary | ICD-10-CM

## 2015-04-06 DIAGNOSIS — R29898 Other symptoms and signs involving the musculoskeletal system: Secondary | ICD-10-CM

## 2015-04-06 DIAGNOSIS — S14106A Unspecified injury at C6 level of cervical spinal cord, initial encounter: Secondary | ICD-10-CM | POA: Diagnosis not present

## 2015-04-06 NOTE — Therapy (Signed)
[facility] [address] [place], [place], [address] Phone: [phone]   Fax:  [phone]  Physical Therapy Treatment  Patient Details  Name: Quindell Shere MRN: [id number] Date of Birth: September 29, 1950 Referring Provider: Faith Rogue, MD  Encounter Date: 04/06/2015      PT End of Session - 04/06/15 1408    Visit Number 7   Number of Visits 17   Date for PT Re-Evaluation 05/07/15   Authorization Type MCR trad primary, BCBS secondary    PT Start Time 1400   PT Stop Time 1445   PT Time Calculation (min) 45 min   Activity Tolerance Patient tolerated treatment well   Behavior During Therapy [facility] for tasks assessed/performed      Past Medical History  Diagnosis Date  . Bradycardia   . Chronic abdominal pain   . Hesitancy of micturition     Past Surgical History  Procedure Laterality Date  . Anterior cervical decomp/discectomy fusion N/A 11/14/2014    Procedure: Cervical Five-Cervical Six  Anterior Cervical Decompression/Diskectomy/Fusion;  Surgeon: Maeola Harman, MD;  Location: [facility];  Service: Neurosurgery;  Laterality: N/A;    There were no vitals filed for this visit.  Visit Diagnosis:  Decreased functional activity tolerance  Decreased strength of trunk and back  Lack of coordination  Generalized weakness  Unsteadiness      Subjective Assessment - 04/06/15 1407    Subjective No changes since last visit, no falls.    Limitations Walking;Standing;House hold activities;Sitting;Lifting   Patient Stated Goals "to walk without this walker"   Currently in Pain? Yes   Pain Score 2    Pain Location Arm   Pain Orientation Right;Left   Pain Descriptors / Indicators Throbbing;Tingling   Pain Type Chronic pain   Pain Radiating Towards shoulders and hands   Pain Onset More than a month ago   Pain Frequency Constant   Aggravating Factors  worse in am and late at night   Pain Relieving Factors  activity, pain meds            NMR:  Performed several exercises for improved trunk control, trunk/pelvis dissociation and activation of core muscles with improved posture/postural control.  While seated on physioball had pt reach laterally and superiorly for trunk dissociation, core activation.  Tolerated well with cues for posture and adjustment of targets for increased challenge.  Performed x 2 sets of 10 reps.  Progressed to seated on ball while performing ant/post pelvic tilt, again for improved trunk dissociation and for improved postural control.  Tolerated well with mod facilitation cues at pelvis for increased anterior pelvic tilt.  Then worked on trunk stablization in quadruped position (provided pt with bowl to place hands on for increased comfort and decreased pain in B wrists).  Began with having pt shift weight anterior/posterior for WB through UEs.  Verbal, demonstration and tactile cues for neutral spine during all tasks.  Also work on "cat/camel" stretching for pectoral stretch/trunk extensor strength.  Tolerated well with facilitation cues during task.  Progressed to alternating LEs for improved core stabilization>alternating UEs>alternating UE/LE.  Note increased fatigue, esp in LUE during task, therefore provided assist as needed for stabilization in LUE.   Performed x 5 reps each with rest break on elbows in between.  Ended session with SL task (each side) with arm and leg elevated with external perturbations to further engage core muscles.  Tolerated well.  PT Education - 04/06/15 1407    Education provided Yes   Education Details Continue to encourage compliance with HEP, esp for postural exercises.    Person(s) Educated Patient   Methods Explanation   Comprehension Verbalized understanding          PT Short Term Goals - 03/08/15 1224    PT SHORT TERM GOAL #1   Title Pt will initiate HEP for balance and strengthening in order to  indicate decreased fall risk and improved functional mobility.  (Target Date: 04/05/15)   PT SHORT TERM GOAL #2   Title Pt will increase gait speed to 2.03 ft/ sec in order to indicate decreased fall risk and improved efficiency of gait.     PT SHORT TERM GOAL #3   Title Pt will increase BERG balance test to 42/56 in order to indicate decreased fall risk and improved functional balance.    PT SHORT TERM GOAL #4   Title Pt will ambulate >150' on indoor surfaces w/ SPC at mod I level in order to indicate safe home negotiation.     PT SHORT TERM GOAL #5   Title Pt will perform flight of stairs w/ single handrail in alternating pattern at mod I level in order to indicate safe negotiation in home.     Additional Short Term Goals   Additional Short Term Goals Yes   PT SHORT TERM GOAL #6   Title Pt will verbalize fall prevention strategies independently in order to better decrease risk of falling at home.             PT Long Term Goals - 03/08/15 1228    PT LONG TERM GOAL #1   Title Pt will be independent with HEP for strengthening and balance in order to indicate improved functional mobility and decreased balance.  (Target Date: 05/02/14)   PT LONG TERM GOAL #2   Title Pt will increase gait speed to 2.63 ft/sec in order to indicate decreased fall risk and and indicate pt is safe community ambulator.     PT LONG TERM GOAL #3   Title Pt will improve BERG balance test score to 46/56 in order to indicate decreased fall risk and improved functional balance.    PT LONG TERM GOAL #4   Title Pt will ambulate >500' without AD on paved outdoor surfaces and unlevel surfaces (grass) at mod I level in order to indicate safe community and outdoor home negotiation.     PT LONG TERM GOAL #5   Title Pt will indicate return to leisure activities in order to indicate safe return to community functions.                 Plan - 04/06/15 1408    Clinical Impression Statement Skilled session focused on  trunk control, postural control, core stabilization, and trunk/pelvic dissociation.  Tolerated well with mild c/o fatigue and some pain in LUE.  Continue POC.   Pt will benefit from skilled therapeutic intervention in order to improve on the following deficits Abnormal gait;Decreased activity tolerance;Decreased balance;Decreased coordination;Decreased endurance;Decreased mobility;Decreased strength;Impaired perceived functional ability;Impaired sensation;Impaired UE functional use;Improper body mechanics;Postural dysfunction;Pain   Rehab Potential Excellent   PT Frequency 2x / week   PT Duration 8 weeks   PT Treatment/Interventions ADLs/Self Care Home Management;Electrical Stimulation;DME Instruction;Gait training;Stair training;Functional mobility training;Therapeutic exercise;Therapeutic activities;Balance training;Neuromuscular re-education;Patient/family education;Manual techniques   PT Next Visit Plan Continue to assess gait with SPC, esp outdoors. Gait training with cueing for upright posture. Address selective  control in LLE. Exercises for posture strengthening/control, trunk strengthening   Consulted and Agree with Plan of Care Patient        Problem List Patient Active Problem List   Diagnosis Date Noted  . Orthostatic hypotension 11/30/2014  . C6 spinal cord injury (HCC) 11/20/2014  . Neurogenic bowel 11/20/2014  . Neurogenic bladder 11/20/2014  . Fever of unknown origin   . Quadriplegia (HCC) 11/14/2014  . C5 vertebral fracture (HCC) 11/09/2014   Harriet Butte, PT, MPT [facility] [address] [place], [place], [address] Phone: [phone]   Fax:  [phone] 04/06/2015, 3:44 PM  Name: Inocente Krach MRN: [id number] Date of Birth: 1950/07/20

## 2015-04-06 NOTE — Therapy (Signed)
[facility] [address] [place], [place], [address] Phone: [phone]   Fax:  [phone]  Occupational Therapy Treatment  Patient Details  Name: Carl [name] MRN: [id number] Date of Birth: Mar 14, 1951 Referring Provider: Faith Rogue  Encounter Date: 04/06/2015      OT End of Session - 04/06/15 1736    Visit Number 8   Number of Visits 17   Date for OT Re-Evaluation 05/02/14   Authorization Type MCR - G code needed   Authorization - Visit Number 8   Authorization - Number of Visits 10   OT Start Time 1445   OT Stop Time 1520   OT Time Calculation (min) 35 min   Activity Tolerance Patient tolerated treatment well      Past Medical History  Diagnosis Date  . Bradycardia   . Chronic abdominal pain   . Hesitancy of micturition     Past Surgical History  Procedure Laterality Date  . Anterior cervical decomp/discectomy fusion N/A 11/14/2014    Procedure: Cervical Five-Cervical Six  Anterior Cervical Decompression/Diskectomy/Fusion;  Surgeon: Maeola Harman, MD;  Location: [facility];  Service: Neurosurgery;  Laterality: N/A;    There were no vitals filed for this visit.  Visit Diagnosis:  Lack of coordination  Pain in left wrist  Decreased grip strength of left hand  Decreased pinch strength  Decreased strength of trunk and back      Subjective Assessment - 04/06/15 1448    Subjective  My posture is worse since all of this happened.   Patient Stated Goals increase strength and decrease tingling   Currently in Pain? Yes   Pain Score 2    Pain Location Arm   Pain Orientation Right;Left   Pain Descriptors / Indicators Tingling;Throbbing   Pain Type Chronic pain   Pain Radiating Towards shoulders and hands   Pain Onset More than a month ago   Pain Frequency Constant   Aggravating Factors  worse in am and late at night   Pain Relieving Factors activity, pain meds   Effect of Pain on Daily  Activities affects ability to pick up things with L hand   Multiple Pain Sites No                      OT Treatments/Exercises (OP) - 04/06/15 0001    Neurological Re-education Exercises   Other Exercises 1 Neuro re ed to address improve active trunk movement as well as addressing improved trunk control. Pt with very tight scapulae and restricted movement in upper thoracic trunk limiting lower trunk movement, active conrtrol and UE mid to high reach as well as functional mobility.  Addressed scapulae movement in supine and sidelying with pt actively moving within length, then transitioned to sitting to address scapulae mobility on stable trunk followed by active lower trunk with upper trunk; progresed to active trunk with bilateral overhead reach in closed chain actiivity.                  OT Short Term Goals - 04/06/15 1733    OT SHORT TERM GOAL #1   Title Pt will increase LUE gross grip to 30 lbs to help increase overall functional use and independence of L hand    Baseline 20lbs    Time 4   Period Weeks   Status On-going   OT SHORT TERM GOAL #2   Title Pt will be educated on trunk/core strengthening exercises to increase overall posture  and strength in order to safely perform ADLs   Time 4   Period Weeks   Status On-going   OT SHORT TERM GOAL #3   Title Pt will be educated on desensitization exercises to decrease hypersensitivity to LUE    Time 4   Period Weeks   Status On-going   OT SHORT TERM GOAL #4   Title Pt will be independent with self correcting poor tecnique and compensatory strategies to left shoulder when functionally using LUE   Time 4   Period Weeks   Status On-going           OT Long Term Goals - 04/06/15 1733    OT LONG TERM GOAL #1   Title Pt will increase LUE gross grip to 40 lbs to help increase overall functional use and independence of L hand    Baseline 20lbs at baseline   Time 8   Period Weeks   Status On-going   OT LONG  TERM GOAL #2   Title Pt will be independent with LB ADLs   Baseline min assist    Time 8   Period Weeks   Status On-going   OT LONG TERM GOAL #3   Title Pt will improve 9-hole peg test time to less than 55 seconds    Baseline 73 seconds   Time 8   Period Weeks   Status On-going   OT LONG TERM GOAL #4   Title Pt will improve left wrist strength to 4/5 strength    Baseline 2+/5 strength    Time 8   Period Weeks   Status On-going   OT LONG TERM GOAL #5   Title Pt will improve lateral and three pinch strength by at least 5lbs   Baseline 7lbs for both lateal and three pinch   Time 8   Period Weeks   Status On-going               Plan - 04/06/15 1733    Clinical Impression Statement Pt progressing toward goals with some gains in trunk movement, core stability and lower trunk initated movement.   Pt will benefit from skilled therapeutic intervention in order to improve on the following deficits (Retired) Decreased activity tolerance;Decreased balance;Decreased coordination;Decreased endurance;Decreased mobility;Decreased range of motion;Decreased safety awareness;Decreased skin integrity;Decreased strength;Increased edema;Impaired UE functional use;Pain;Improper body mechanics;Increased muscle spasms;Impaired sensation   Rehab Potential Excellent   Clinical Impairments Affecting Rehab Potential None known at this time   OT Frequency 2x / week   OT Duration 8 weeks   OT Treatment/Interventions Self-care/ADL training;Electrical Stimulation;Moist Heat;Traction;Therapeutic exercise;Neuromuscular education;Energy conservation;Functional Mobility Training;Passive range of motion;Therapeutic exercises;Therapeutic activities;Patient/family education;Balance training;Fluidtherapy   Plan continue to address trunk control, coordinaton, fluido for bilateral hands if time allows   OT Home Exercise Plan fludiotherapy at beginning of session (I had patient do bilateral UEs), coordination and  putty HEP, desensitization techniques 03/14/15, theraband HEP 03/16/15   Consulted and Agree with Plan of Care Patient   Family Member Consulted wife        Problem List Patient Active Problem List   Diagnosis Date Noted  . Orthostatic hypotension 11/30/2014  . C6 spinal cord injury (HCC) 11/20/2014  . Neurogenic bowel 11/20/2014  . Neurogenic bladder 11/20/2014  . Fever of unknown origin   . Quadriplegia (HCC) 11/14/2014  . C5 vertebral fracture (HCC) 11/09/2014    Norton Pastelulaski, Karen Halliday, OTR/L 04/06/2015, 5:39 PM  [facility] [address] [place], [place], [address] Phone:  [phone]   Fax:  [phone]  Name: Linzie Boursiquot MRN: [id number] Date of Birth: [birth year]

## 2015-04-10 ENCOUNTER — Encounter: Payer: Self-pay | Admitting: Occupational Therapy

## 2015-04-10 ENCOUNTER — Ambulatory Visit: Payer: Medicare Other | Admitting: Rehabilitation

## 2015-04-10 ENCOUNTER — Ambulatory Visit: Payer: Medicare Other | Attending: Physical Medicine & Rehabilitation | Admitting: Occupational Therapy

## 2015-04-10 DIAGNOSIS — R29898 Other symptoms and signs involving the musculoskeletal system: Secondary | ICD-10-CM | POA: Diagnosis present

## 2015-04-10 DIAGNOSIS — R2681 Unsteadiness on feet: Secondary | ICD-10-CM | POA: Diagnosis present

## 2015-04-10 DIAGNOSIS — R269 Unspecified abnormalities of gait and mobility: Secondary | ICD-10-CM | POA: Diagnosis present

## 2015-04-10 DIAGNOSIS — R531 Weakness: Secondary | ICD-10-CM

## 2015-04-10 DIAGNOSIS — M25532 Pain in left wrist: Secondary | ICD-10-CM | POA: Diagnosis present

## 2015-04-10 DIAGNOSIS — X58XXXA Exposure to other specified factors, initial encounter: Secondary | ICD-10-CM | POA: Diagnosis not present

## 2015-04-10 DIAGNOSIS — R279 Unspecified lack of coordination: Secondary | ICD-10-CM

## 2015-04-10 DIAGNOSIS — M6289 Other specified disorders of muscle: Secondary | ICD-10-CM | POA: Insufficient documentation

## 2015-04-10 DIAGNOSIS — R6889 Other general symptoms and signs: Secondary | ICD-10-CM | POA: Insufficient documentation

## 2015-04-10 DIAGNOSIS — S14106A Unspecified injury at C6 level of cervical spinal cord, initial encounter: Secondary | ICD-10-CM | POA: Diagnosis present

## 2015-04-10 DIAGNOSIS — M6281 Muscle weakness (generalized): Secondary | ICD-10-CM | POA: Insufficient documentation

## 2015-04-10 NOTE — Patient Instructions (Addendum)
Fall Prevention in the Home  Falls can cause injuries and can affect people from all age groups. There are many simple things that you can do to make your home safe and to help prevent falls. WHAT CAN I DO ON THE OUTSIDE OF MY HOME?  Regularly repair the edges of walkways and driveways and fix any cracks.  Remove high doorway thresholds.  Trim any shrubbery on the main path into your home.  Use bright outdoor lighting.  Clear walkways of debris and clutter, including tools and rocks.  Regularly check that handrails are securely fastened and in good repair. Both sides of any steps should have handrails.  Install guardrails along the edges of any raised decks or porches.  Have leaves, snow, and ice cleared regularly.  Use sand or salt on walkways during winter months.  In the garage, clean up any spills right away, including grease or oil spills. WHAT CAN I DO IN THE BATHROOM?  Use night lights.  Install grab bars by the toilet and in the tub and shower. Do not use towel bars as grab bars.  Use non-skid mats or decals on the floor of the tub or shower.  If you need to sit down while you are in the shower, use a plastic, non-slip stool..  Keep the floor dry. Immediately clean up any water that spills on the floor.  Remove soap buildup in the tub or shower on a regular basis.  Attach bath mats securely with double-sided non-slip rug tape.  Remove throw rugs and other tripping hazards from the floor. WHAT CAN I DO IN THE BEDROOM?  Use night lights.  Make sure that a bedside light is easy to reach.  Do not use oversized bedding that drapes onto the floor.  Have a firm chair that has side arms to use for getting dressed.  Remove throw rugs and other tripping hazards from the floor. WHAT CAN I DO IN THE KITCHEN?   Clean up any spills right away.  Avoid walking on wet floors.  Place frequently used items in easy-to-reach places.  If you need to reach for something  above you, use a sturdy step stool that has a grab bar.  Keep electrical cables out of the way.  Do not use floor polish or wax that makes floors slippery. If you have to use wax, make sure that it is non-skid floor wax.  Remove throw rugs and other tripping hazards from the floor. WHAT CAN I DO IN THE STAIRWAYS?  Do not leave any items on the stairs.  Make sure that there are handrails on both sides of the stairs. Fix handrails that are broken or loose. Make sure that handrails are as long as the stairways.  Check any carpeting to make sure that it is firmly attached to the stairs. Fix any carpet that is loose or worn.  Avoid having throw rugs at the top or bottom of stairways, or secure the rugs with carpet tape to prevent them from moving.  Make sure that you have a light switch at the top of the stairs and the bottom of the stairs. If you do not have them, have them installed. WHAT ARE SOME OTHER FALL PREVENTION TIPS?  Wear closed-toe shoes that fit well and support your feet. Wear shoes that have rubber soles or low heels.  When you use a stepladder, make sure that it is completely opened and that the sides are firmly locked. Have someone hold the ladder while you   are using it. Do not climb a closed stepladder.  Add color or contrast paint or tape to grab bars and handrails in your home. Place contrasting color strips on the first and last steps.  Use mobility aids as needed, such as canes, walkers, scooters, and crutches.  Turn on lights if it is dark. Replace any light bulbs that burn out.  Set up furniture so that there are clear paths. Keep the furniture in the same spot.  Fix any uneven floor surfaces.  Choose a carpet design that does not hide the edge of steps of a stairway.  Be aware of any and all pets.  Review your medicines with your healthcare provider. Some medicines can cause dizziness or changes in blood pressure, which increase your risk of falling. Talk  with your health care provider about other ways that you can decrease your risk of falls. This may include working with a physical therapist or trainer to improve your strength, balance, and endurance.   This information is not intended to replace advice given to you by your health care provider. Make sure you discuss any questions you have with your health care provider.   Document Released: 03/14/2002 Document Revised: 08/08/2014 Document Reviewed: 04/28/2014 Elsevier Interactive Patient Education 2016 Elsevier Inc.   Functional Quadriceps: Sit to Stand    Sit on edge of chair, feet flat on floor. Stand upright, extending knees fully. Find lower surface at home (start with your couch). Make sure that when you stand, your legs don't hit the back of the couch. Do not use your hands if you can help it.  Repeat _10___ times per set. Do __1__ sets per session. Do _2___ sessions per day.  http://orth.exer.us/734   Copyright  VHI. All rights reserved.   "I love a Licensed conveyancer    Stand next to counter top with R hand holding onto counter for safety. March in place 10 times on each leg: as high as you can. Do __2__ sessions per day.  http://gt2.exer.us/344   Copyright  VHI. All rights reserved.   ABDUCTION: Standing (Active)    Stand, feet flat. Lift right leg out to side x 10 reps, then repeat on other side. When moving R leg out to the R, make sure that L hip remains "activated" and tucked in.  Complete _1__ sets of _10__ repetitions. Perform _2__ sessions per day.  http://gtsc.exer.us/110   Copyright  VHI. All rights reserved.   Bracing With Bridging (Hook-Lying)    With neutral spine, tighten pelvic floor and abdominals and hold. Lift bottom. Repeat _10__ times. Do _2__ times a day. I want you to try and hold your arms above your chest (clasp them together) so that only your legs are working during the exercise.    Copyright  VHI. All rights reserved.    Abduction: Clam (Eccentric) - Side-Lying    Lie on side with knees bent. Lift top knee, keeping feet together. Keep trunk steady. Slowly lower for 3-5 seconds. _10__ reps per set, _2__ sets per day, _5__ days per week. Make sure that your trunk does not rotate backwards, stay completely on your side.   http://ecce.exer.us/64   Copyright  VHI. All rights reserved.   Scapular Retraction (Standing)    With arms at sides, pinch shoulder blades together. You can do this seated or standing, but make sure that you are sitting or standing as tall as you can when doing this.  Repeat __10__ times per set. Do __1__ sets per session.  Do __2__ sessions per day.  Modified on 04/10/15 to add standing and with red theraband in doorway.    http://orth.exer.us/944

## 2015-04-10 NOTE — Therapy (Signed)
[facility] [address] [place], [place], [address] Phone: [phone]   Fax:  [phone]  Occupational Therapy Treatment  Patient Details  Name: Carl [name] MRN: [id number] Date of Birth: 1951-03-16 Referring Provider: Alger Simons  Encounter Date: 04/10/2015      OT End of Session - 04/10/15 1507    Visit Number 9   Number of Visits 17   Date for OT Re-Evaluation 05/02/14   Authorization Type MCR - G code needed   Authorization - Visit Number 9   Authorization - Number of Visits 10   OT Start Time 1400   OT Stop Time 1445   OT Time Calculation (min) 45 min   Activity Tolerance Patient tolerated treatment well      Past Medical History  Diagnosis Date  . Bradycardia   . Chronic abdominal pain   . Hesitancy of micturition     Past Surgical History  Procedure Laterality Date  . Anterior cervical decomp/discectomy fusion N/A 11/14/2014    Procedure: Cervical Five-Cervical Six  Anterior Cervical Decompression/Diskectomy/Fusion;  Surgeon: Erline Levine, MD;  Location: [facility];  Service: Neurosurgery;  Laterality: N/A;    There were no vitals filed for this visit.  Visit Diagnosis:  Decreased functional activity tolerance  Decreased strength of trunk and back  Lack of coordination  Generalized weakness  Decreased grip strength of left hand  Decreased pinch strength      Subjective Assessment - 04/10/15 1407    Subjective  I decided to start using a cane.   Patient Stated Goals increase strength and decrease tingling   Currently in Pain? Yes   Pain Score 2    Pain Location Arm   Pain Orientation Right;Left   Pain Descriptors / Indicators Tingling;Throbbing   Pain Type Chronic pain   Pain Onset More than a month ago   Pain Frequency Constant   Aggravating Factors  worse in the am and late at night   Pain Relieving Factors activity, pain meds.                      OT  Treatments/Exercises (OP) - 04/10/15 0001    Neurological Re-education Exercises   Other Exercises 1 Neuro re ed to address trunk mobility and core strength and progressing to scapular mobility, upper trunk dissociated movement from lower trunk to assist in overhead reach. Progressed to bilateral overhead reach with resistance and then unilteral overhead reach with resistance. Pt with improved trunk mobility and stability and functional overhead reach of BUE's both in closed chain and open unilateral reach.                   OT Short Term Goals - 04/10/15 1451    OT SHORT TERM GOAL #1   Title Pt will increase LUE gross grip to 30 lbs to help increase overall functional use and independence of L hand - 04/05/2015   Baseline 20lbs    Time 4   Period Weeks   Status Not Met  20 pounds 04/10/2015   OT SHORT TERM GOAL #2   Title Pt will be educated on trunk/core strengthening exercises to increase overall posture and strength in order to safely perform ADLs   Time 4   Period Weeks   Status Achieved   OT SHORT TERM GOAL #3   Title Pt will be educated on desensitization exercises to decrease hypersensitivity to LUE    Time 4   Period  Weeks   Status Achieved   OT SHORT TERM GOAL #4   Title Pt will be independent with self correcting poor tecnique and compensatory strategies to left shoulder when functionally using LUE   Time 4   Period Weeks   Status Achieved           OT Long Term Goals - 04/10/15 1454    OT LONG TERM GOAL #1   Title Pt will increase LUE gross grip to 30 lbs to help increase overall functional use and independence of L hand - 05/03/2015   Baseline 20lbs at baseline   Time 8   Period Weeks   Status Revised   OT LONG TERM GOAL #2   Title Pt will be independent with LB ADLs   Baseline min assist    Time 8   Period Weeks   Status Achieved   OT LONG TERM GOAL #3   Title Pt will improve 9-hole peg test time to less than 32 seconds    Baseline 73 seconds    Time 8   Period Weeks   Status Revised  37.75 on 04/10/2015 therefore revised goal   OT LONG TERM GOAL #4   Title Pt will improve left wrist strength to 4/5 strength    Baseline 2+/5 strength    Time 8   Period Weeks   Status On-going   OT LONG TERM GOAL #5   Title Pt will improve lateral and three pinch strength by at least 5lbs   Baseline 7lbs for both lateal and three pinch   Time 8   Period Weeks   Status On-going               Plan - 04/10/15 1456    Clinical Impression Statement Pt is progressing well toward goals - pt has met all but one STG.  Pt with improved overall UE strength as well as funcitonal mobility and core strength.    Pt will benefit from skilled therapeutic intervention in order to improve on the following deficits (Retired) Decreased activity tolerance;Decreased balance;Decreased coordination;Decreased endurance;Decreased mobility;Decreased range of motion;Decreased safety awareness;Decreased skin integrity;Decreased strength;Increased edema;Impaired UE functional use;Pain;Improper body mechanics;Increased muscle spasms;Impaired sensation   Rehab Potential Excellent   Clinical Impairments Affecting Rehab Potential None known at this time   OT Frequency 2x / week   OT Duration 8 weeks   OT Treatment/Interventions Self-care/ADL training;Electrical Stimulation;Moist Heat;Traction;Therapeutic exercise;Neuromuscular education;Energy conservation;Functional Mobility Training;Passive range of motion;Therapeutic exercises;Therapeutic activities;Patient/family education;Balance training;Fluidtherapy   Plan continue to address trunk mobility and strength,    OT Home Exercise Plan fludiotherapy at beginning of session (I had patient do bilateral UEs), coordination and putty HEP, desensitization techniques 03/14/15, theraband HEP 03/16/15   Consulted and Agree with Plan of Care Patient        Problem List Patient Active Problem List   Diagnosis Date Noted  .  Orthostatic hypotension 11/30/2014  . C6 spinal cord injury ([place]) 11/20/2014  . Neurogenic bowel 11/20/2014  . Neurogenic bladder 11/20/2014  . Fever of unknown origin   . Quadriplegia ([place]) 11/14/2014  . C5 vertebral fracture ([place]) 11/09/2014    Quay Burow, OTR/L 04/10/2015, 3:14 PM  [facility] [address] [place], [place], [address] Phone: [phone]   Fax:  [phone]  Name: Carl [name] MRN: [id number] Date of Birth: 05-28-1950

## 2015-04-10 NOTE — Therapy (Signed)
[facility] [address] [place], [place], [address] Phone: [phone]   Fax:  [phone]  Physical Therapy Treatment  Patient Details  Name: Carl [name] MRN: [id number] Date of Birth: [birth year] Referring Provider: Alger Simons, MD  Encounter Date: 04/10/2015      PT End of Session - 04/10/15 1454    Visit Number 8   Number of Visits 17   Date for PT Re-Evaluation 05/07/15   Authorization Type MCR trad primary, BCBS secondary    PT Start Time 1445   PT Stop Time 1530   PT Time Calculation (min) 45 min   Activity Tolerance Patient tolerated treatment well   Behavior During Therapy [facility] for tasks assessed/performed      Past Medical History  Diagnosis Date  . Bradycardia   . Chronic abdominal pain   . Hesitancy of micturition     Past Surgical History  Procedure Laterality Date  . Anterior cervical decomp/discectomy fusion N/A 11/14/2014    Procedure: Cervical Five-Cervical Six  Anterior Cervical Decompression/Diskectomy/Fusion;  Surgeon: Erline Levine, MD;  Location: [facility];  Service: Neurosurgery;  Laterality: N/A;    There were no vitals filed for this visit.  Visit Diagnosis:  Abnormality of gait  Unsteadiness  Generalized weakness  Lack of coordination  Left-sided weakness      Subjective Assessment - 04/10/15 1452    Subjective Pt reports no changes since last visit, no falls.  Note that pt is walking at "community level" without RW but with SPC.  Education regarding safety with use of RW in community for now until progresses to [facility] safely.    Limitations Walking;Standing;House hold activities;Sitting;Lifting   Patient Stated Goals "to walk without this walker"   Currently in Pain? Yes   Pain Score 2    Pain Location Arm   Pain Orientation Right;Left   Pain Descriptors / Indicators Throbbing;Tingling   Pain Type Chronic pain   Pain Radiating Towards shoulders/hands   Pain Onset  More than a month ago   Pain Frequency Constant   Aggravating Factors  worse in am than pm   Pain Relieving Factors activity, pain meds, heat        NMR:  Assessed STG for BERG balance test with new score of 50/56, indicative of improved functional balance and decreased fall risk.  Educated pt on meaning of balance test results.  Pt verbalized understanding.  Discussed fall prevention strategies to decrease fall risk at home.  Pt requires several cues to recall and will benefit from continued education.    Gait:  Assessed STG's for gait speed and quality of gait with SPC.  Note gait speed of 1.68 ft/sec, not meeting goal but demonstrating improvement from time of evaluation (also note that this performed with SPC vs RW on day of eval).  Educated on importance of improving gait speed, but also increased safety and demonstrating improved posture. Pt verbalized understanding.  Also addressed goal for distance w/ LRAD.  Pt able to ambulate 230' at mod I level with SPC.  Min cues for posture and adjustment made to personal cane for increased safety and efficiency.    TE:  Assessed compliance and performance with HEP.  Requires several cues throughout on sequencing, see pt instruction for details on exercises performed during session.  Modified scapular retraction to add red theraband in standing for increased challenge.                   [facility]  Adult PT Treatment/Exercise - 04/10/15 1506    Standardized Balance Assessment   Standardized Balance Assessment Berg Balance Test   Berg Balance Test   Sit to Stand Able to stand without using hands and stabilize independently   Standing Unsupported Able to stand safely 2 minutes   Sitting with Back Unsupported but Feet Supported on Floor or Stool Able to sit safely and securely 2 minutes   Stand to Sit Sits safely with minimal use of hands   Transfers Able to transfer safely, minor use of hands   Standing Unsupported with Eyes Closed Able to  stand 10 seconds safely   Standing Ubsupported with Feet Together Able to place feet together independently and stand 1 minute safely   From Standing, Reach Forward with Outstretched Arm Can reach confidently >25 cm (10")   From Standing Position, Pick up Object from Floor Able to pick up shoe, needs supervision   From Standing Position, Turn to Look Behind Over each Shoulder Looks behind from both sides and weight shifts well   Turn 360 Degrees Able to turn 360 degrees safely but slowly   Standing Unsupported, Alternately Place Feet on Step/Stool Able to complete >2 steps/needs minimal assist   Standing Unsupported, One Foot in Front Able to place foot tandem independently and hold 30 seconds   Standing on One Leg Able to lift leg independently and hold > 10 seconds   Total Score 50                PT Education - 04/10/15 1454    Education provided Yes   Education Details Education to continue use of RW in community, progressing towards use of SPC.    Person(s) Educated Patient   Methods Explanation   Comprehension Verbalized understanding          PT Short Term Goals - 04/10/15 1455    PT SHORT TERM GOAL #1   Title Pt will initiate HEP for balance and strengthening in order to indicate decreased fall risk and improved functional mobility.  (Target Date: 04/05/15)   Baseline met 04/10/15   Status Achieved   PT SHORT TERM GOAL #2   Title Pt will increase gait speed to 2.03 ft/ sec in order to indicate decreased fall risk and improved efficiency of gait.     Baseline 1.68 ft/sec on 04/10/15   Status Partially Met   PT SHORT TERM GOAL #3   Title Pt will increase BERG balance test to 42/56 in order to indicate decreased fall risk and improved functional balance.    Baseline 50/56 on 04/10/15   Status Achieved   PT SHORT TERM GOAL #4   Title Pt will ambulate >150' on indoor surfaces w/ SPC at mod I level in order to indicate safe home negotiation.     Baseline 230' with SPC at mod  I level on 04/10/15   Status Achieved   PT SHORT TERM GOAL #5   Title Pt will perform flight of stairs w/ single handrail in alternating pattern at mod I level in order to indicate safe negotiation in home.     Baseline met 04/10/15   Status Achieved   PT SHORT TERM GOAL #6   Title Pt will verbalize fall prevention strategies independently in order to better decrease risk of falling at home.     Baseline Pt needs mod cues to recall    Status Partially Met           PT Long Term Goals -  03/08/15 1228    PT LONG TERM GOAL #1   Title Pt will be independent with HEP for strengthening and balance in order to indicate improved functional mobility and decreased balance.  (Target Date: 05/02/14)   PT LONG TERM GOAL #2   Title Pt will increase gait speed to 2.63 ft/sec in order to indicate decreased fall risk and and indicate pt is safe community ambulator.     PT LONG TERM GOAL #3   Title Pt will improve BERG balance test score to 46/56 in order to indicate decreased fall risk and improved functional balance.    PT LONG TERM GOAL #4   Title Pt will ambulate >500' without AD on paved outdoor surfaces and unlevel surfaces (grass) at mod I level in order to indicate safe community and outdoor home negotiation.     PT LONG TERM GOAL #5   Title Pt will indicate return to leisure activities in order to indicate safe return to community functions.                 Plan - 04/10/15 1454    Clinical Impression Statement Skilled session focused on addressing STG's.  Pt has met 4/6 goals and partially met 2/6 goals.  See below for details.  Pt continues to needs cues for HEP compliance as well as cues for safety and using RW in community.  Pt verbalized understanding.    Pt will benefit from skilled therapeutic intervention in order to improve on the following deficits Abnormal gait;Decreased activity tolerance;Decreased balance;Decreased coordination;Decreased endurance;Decreased mobility;Decreased  strength;Impaired perceived functional ability;Impaired sensation;Impaired UE functional use;Improper body mechanics;Postural dysfunction;Pain   Rehab Potential Excellent   PT Frequency 2x / week   PT Duration 8 weeks   PT Treatment/Interventions ADLs/Self Care Home Management;Electrical Stimulation;DME Instruction;Gait training;Stair training;Functional mobility training;Therapeutic exercise;Therapeutic activities;Balance training;Neuromuscular re-education;Patient/family education;Manual techniques   PT Next Visit Plan Continue to assess gait with SPC, esp outdoors. Gait training with cueing for upright posture. Address selective control in LLE. Exercises for posture strengthening/control, trunk strengthening   Consulted and Agree with Plan of Care Patient        Problem List Patient Active Problem List   Diagnosis Date Noted  . Orthostatic hypotension 11/30/2014  . C6 spinal cord injury ([place]) 11/20/2014  . Neurogenic bowel 11/20/2014  . Neurogenic bladder 11/20/2014  . Fever of unknown origin   . Quadriplegia ([place]) 11/14/2014  . C5 vertebral fracture ([place]) 11/09/2014    Cameron Sprang, PT, MPT [facility] [address] [place], [place], [address] Phone: [phone]   Fax:  [phone] 04/10/2015, 6:11 PM  Name: Carl [name] MRN: [id number] Date of Birth: November 20, 1950

## 2015-04-10 NOTE — Therapy (Deleted)
[facility] [address] [place], [place], [address] Phone: [phone]   Fax:  [phone]  Occupational Therapy Treatment  Patient Details  Name: Carl [name] MRN: [id number] Date of Birth: 02-07-1951 Referring Provider: Alger Simons  Encounter Date: 04/10/2015      OT End of Session - 04/10/15 1507    Visit Number 9   Number of Visits 17   Date for OT Re-Evaluation 05/02/14   Authorization Type MCR - G code needed   Authorization - Visit Number 9   Authorization - Number of Visits 10   OT Start Time 1400   OT Stop Time 1445   OT Time Calculation (min) 45 min   Activity Tolerance Patient tolerated treatment well      Past Medical History  Diagnosis Date  . Bradycardia   . Chronic abdominal pain   . Hesitancy of micturition     Past Surgical History  Procedure Laterality Date  . Anterior cervical decomp/discectomy fusion N/A 11/14/2014    Procedure: Cervical Five-Cervical Six  Anterior Cervical Decompression/Diskectomy/Fusion;  Surgeon: Erline Levine, MD;  Location: [facility];  Service: Neurosurgery;  Laterality: N/A;    There were no vitals filed for this visit.  Visit Diagnosis:  Decreased functional activity tolerance  Decreased strength of trunk and back  Lack of coordination  Generalized weakness  Decreased grip strength of left hand  Decreased pinch strength      Subjective Assessment - 04/10/15 1407    Subjective  I decided to start using a cane.   Patient Stated Goals increase strength and decrease tingling   Currently in Pain? Yes   Pain Score 2    Pain Location Arm   Pain Orientation Right;Left   Pain Descriptors / Indicators Tingling;Throbbing   Pain Type Chronic pain   Pain Onset More than a month ago   Pain Frequency Constant   Aggravating Factors  worse in the am and late at night   Pain Relieving Factors activity, pain meds.                                 OT Short Term Goals - 04/10/15 1451    OT SHORT TERM GOAL #1   Title Pt will increase LUE gross grip to 30 lbs to help increase overall functional use and independence of L hand - 04/05/2015   Baseline 20lbs    Time 4   Period Weeks   Status Not Met  20 pounds 04/10/2015   OT SHORT TERM GOAL #2   Title Pt will be educated on trunk/core strengthening exercises to increase overall posture and strength in order to safely perform ADLs   Time 4   Period Weeks   Status Achieved   OT SHORT TERM GOAL #3   Title Pt will be educated on desensitization exercises to decrease hypersensitivity to LUE    Time 4   Period Weeks   Status Achieved   OT SHORT TERM GOAL #4   Title Pt will be independent with self correcting poor tecnique and compensatory strategies to left shoulder when functionally using LUE   Time 4   Period Weeks   Status Achieved           OT Long Term Goals - 04/10/15 1454    OT LONG TERM GOAL #1   Title Pt will increase LUE gross grip to 30 lbs to help increase overall  functional use and independence of L hand - 05/03/2015   Baseline 20lbs at baseline   Time 8   Period Weeks   Status Revised   OT LONG TERM GOAL #2   Title Pt will be independent with LB ADLs   Baseline min assist    Time 8   Period Weeks   Status Achieved   OT LONG TERM GOAL #3   Title Pt will improve 9-hole peg test time to less than 32 seconds    Baseline 73 seconds   Time 8   Period Weeks   Status Revised  37.75 on 04/10/2015 therefore revised goal   OT LONG TERM GOAL #4   Title Pt will improve left wrist strength to 4/5 strength    Baseline 2+/5 strength    Time 8   Period Weeks   Status On-going   OT LONG TERM GOAL #5   Title Pt will improve lateral and three pinch strength by at least 5lbs   Baseline 7lbs for both lateal and three pinch   Time 8   Period Weeks   Status On-going               Plan - 04/10/15 1456     Clinical Impression Statement Pt is progressing well toward goals - pt has met all but one STG.  Pt with improved overall UE strength as well as funcitonal mobility and core strength.    Pt will benefit from skilled therapeutic intervention in order to improve on the following deficits (Retired) Decreased activity tolerance;Decreased balance;Decreased coordination;Decreased endurance;Decreased mobility;Decreased range of motion;Decreased safety awareness;Decreased skin integrity;Decreased strength;Increased edema;Impaired UE functional use;Pain;Improper body mechanics;Increased muscle spasms;Impaired sensation   Rehab Potential Excellent   Clinical Impairments Affecting Rehab Potential None known at this time   OT Frequency 2x / week   OT Duration 8 weeks   OT Treatment/Interventions Self-care/ADL training;Electrical Stimulation;Moist Heat;Traction;Therapeutic exercise;Neuromuscular education;Energy conservation;Functional Mobility Training;Passive range of motion;Therapeutic exercises;Therapeutic activities;Patient/family education;Balance training;Fluidtherapy   Plan continue to address trunk mobility and strength,    OT Home Exercise Plan fludiotherapy at beginning of session (I had patient do bilateral UEs), coordination and putty HEP, desensitization techniques 03/14/15, theraband HEP 03/16/15   Consulted and Agree with Plan of Care Patient        Problem List Patient Active Problem List   Diagnosis Date Noted  . Orthostatic hypotension 11/30/2014  . C6 spinal cord injury ([place]) 11/20/2014  . Neurogenic bowel 11/20/2014  . Neurogenic bladder 11/20/2014  . Fever of unknown origin   . Quadriplegia ([place]) 11/14/2014  . C5 vertebral fracture ([place]) 11/09/2014    Quay Burow, OTR/L 04/10/2015, 3:11 PM  [facility] [address] [place], [place], [address] Phone: [phone]   Fax:  [phone]  Name: Carl [name] MRN: [id number] Date of Birth: [birth year]

## 2015-04-12 ENCOUNTER — Ambulatory Visit: Payer: Medicare Other | Admitting: Rehabilitation

## 2015-04-12 ENCOUNTER — Encounter: Payer: Self-pay | Admitting: Rehabilitation

## 2015-04-12 ENCOUNTER — Ambulatory Visit: Payer: Medicare Other | Admitting: Occupational Therapy

## 2015-04-12 DIAGNOSIS — R269 Unspecified abnormalities of gait and mobility: Secondary | ICD-10-CM

## 2015-04-12 DIAGNOSIS — R6889 Other general symptoms and signs: Secondary | ICD-10-CM

## 2015-04-12 DIAGNOSIS — S14106A Unspecified injury at C6 level of cervical spinal cord, initial encounter: Secondary | ICD-10-CM

## 2015-04-12 DIAGNOSIS — R29898 Other symptoms and signs involving the musculoskeletal system: Secondary | ICD-10-CM

## 2015-04-12 DIAGNOSIS — R2681 Unsteadiness on feet: Secondary | ICD-10-CM

## 2015-04-12 DIAGNOSIS — R531 Weakness: Secondary | ICD-10-CM

## 2015-04-12 NOTE — Therapy (Signed)
[facility] [address] [place], [place], [address] Phone: [phone]   Fax:  [phone]  Occupational Therapy Treatment  Patient Details  Name: Carl [name] MRN: [id number] Date of Birth: 1950-07-31 Referring Provider: Alger Simons  Encounter Date: 04/12/2015      OT End of Session - 04/12/15 1122    Visit Number 10   Number of Visits 17   Date for OT Re-Evaluation 05/02/14   Authorization Type MCR - G code needed   Authorization - Visit Number 10   Authorization - Number of Visits 10   OT Start Time 7893   OT Stop Time 1100   OT Time Calculation (min) 45 min   Activity Tolerance Patient tolerated treatment well      Past Medical History  Diagnosis Date  . Bradycardia   . Chronic abdominal pain   . Hesitancy of micturition     Past Surgical History  Procedure Laterality Date  . Anterior cervical decomp/discectomy fusion N/A 11/14/2014    Procedure: Cervical Five-Cervical Six  Anterior Cervical Decompression/Diskectomy/Fusion;  Surgeon: Erline Levine, MD;  Location: [facility];  Service: Neurosurgery;  Laterality: N/A;    There were no vitals filed for this visit.  Visit Diagnosis:  Generalized weakness  Left-sided weakness  Decreased strength of trunk and back      Subjective Assessment - 04/12/15 1031    Patient Stated Goals increase strength and decrease tingling   Currently in Pain? Yes   Pain Score 3    Pain Location Arm   Pain Orientation Right;Left   Pain Descriptors / Indicators Throbbing;Tingling   Pain Type Chronic pain   Pain Onset More than a month ago   Pain Frequency Constant   Aggravating Factors  worse in the am    Pain Relieving Factors activity, pain meds, heat                      OT Treatments/Exercises (OP) - 04/12/15 0001    Neurological Re-education Exercises   Scapular Stabilization Seated;Quadraped   Other Exercises 1 Stading with back at wall -  neck retraction ex's using small deflated ball for proprioceptive input with trunk extension as able.    Other Exercises 2 Standing with back at wall - closed chain high level bilateral shoulder flexion with scapula elevation and depression exercises, followed by scapula depression ex's with arms by side while focus on maintaining shoulders in correct posture (avoiding forward flexion).    Other Weight-Bearing Exercises 1 Quadraped - cow/cat stretch followed by prone with UE's and chest over EOB to roll ball out in forward flexion, then rolling ball back through wt bearing UE's and trunk extension.    Other Weight-Bearing Exercises 2 Seated: bridging pelvis forwards with UE's in shoulder extension and ER.    Development of Reach Weighted closed chain   Weighted Closed Chain Exercises Using weighted ball for bilateral sh. flexion                   OT Short Term Goals - 04/10/15 1451    OT SHORT TERM GOAL #1   Title Pt will increase LUE gross grip to 30 lbs to help increase overall functional use and independence of L hand - 04/05/2015   Baseline 20lbs    Time 4   Period Weeks   Status Not Met  20 pounds 04/10/2015   OT SHORT TERM GOAL #2   Title Pt will be educated on  trunk/core strengthening exercises to increase overall posture and strength in order to safely perform ADLs   Time 4   Period Weeks   Status Achieved   OT SHORT TERM GOAL #3   Title Pt will be educated on desensitization exercises to decrease hypersensitivity to LUE    Time 4   Period Weeks   Status Achieved   OT SHORT TERM GOAL #4   Title Pt will be independent with self correcting poor tecnique and compensatory strategies to left shoulder when functionally using LUE   Time 4   Period Weeks   Status Achieved           OT Long Term Goals - 04/10/15 1454    OT LONG TERM GOAL #1   Title Pt will increase LUE gross grip to 30 lbs to help increase overall functional use and independence of L hand - 05/03/2015    Baseline 20lbs at baseline   Time 8   Period Weeks   Status Revised   OT LONG TERM GOAL #2   Title Pt will be independent with LB ADLs   Baseline min assist    Time 8   Period Weeks   Status Achieved   OT LONG TERM GOAL #3   Title Pt will improve 9-hole peg test time to less than 32 seconds    Baseline 73 seconds   Time 8   Period Weeks   Status Revised  37.75 on 04/10/2015 therefore revised goal   OT LONG TERM GOAL #4   Title Pt will improve left wrist strength to 4/5 strength    Baseline 2+/5 strength    Time 8   Period Weeks   Status On-going   OT LONG TERM GOAL #5   Title Pt will improve lateral and three pinch strength by at least 5lbs   Baseline 7lbs for both lateal and three pinch   Time 8   Period Weeks   Status On-going               Plan - 2015/04/30 1122    Clinical Impression Statement Pt progressing towards UE strength and core control (LUE remains weaker than RUE). Pt has met all STG's except grip strength   Plan coordination, wrist strengthening, grip strengthening Lt hand   OT Home Exercise Plan coordination and putty HEP, desensitization techniques 03/14/15, theraband HEP 03/16/15   Consulted and Agree with Plan of Care Patient          G-Codes - 04-30-2015 1124    Functional Assessment Tool Used 9 hole peg test Lt = 37.75 sec. , grip strength remains the same   Functional Limitation Carrying, moving and handling objects   Carrying, Moving and Handling Objects Current Status (P3825) At least 20 percent but less than 40 percent impaired, limited or restricted   Carrying, Moving and Handling Objects Goal Status (K5397) At least 1 percent but less than 20 percent impaired, limited or restricted      Problem List Patient Active Problem List   Diagnosis Date Noted  . Orthostatic hypotension 11/30/2014  . C6 spinal cord injury ([place]) 11/20/2014  . Neurogenic bowel 11/20/2014  . Neurogenic bladder 11/20/2014  . Fever of unknown origin   . Quadriplegia  ([place]) 11/14/2014  . C5 vertebral fracture ([place]) 11/09/2014    Occupational Therapy Progress Note  Dates of Reporting Period: 03/08/15  to 04/30/15  Objective Reports of Subjective Statement: Pt reports continued mild pain BUE's however LUE worse than RUE with pain  and weakness.   Objective Measurements: LUE 9 hole peg test has improved to 37.75 sec. From 73 sec. at evaluation. Grip strength Lt remains 20 lbs.   Goal Update: See STG's above, and LTG's revised due to improvements in coordination.   Plan: Continue with bilateral UE strengthening, Lt hand coordination and grip strength, and core control/postural exercises  Reason Skilled Services are Required: Pt required continued O.T. Services to maximize core/trunk mobility and control, bilateral UE strengthening and Lt hand function  Carey Bullocks, OTR/L 04/12/2015, 11:25 AM  [facility] [address] [place], [place], [address] Phone: [phone]   Fax:  [phone]  Name: Carl [name] MRN: [id number] Date of Birth: Aug 11, 1950

## 2015-04-12 NOTE — Therapy (Signed)
[facility] [address] [place], [place], [address] Phone: [phone]   Fax:  [phone]  Physical Therapy Treatment  Patient Details  Name: Carl [name] MRN: [id number] Date of Birth: [birth year] Referring Provider: Alger Simons, MD  Encounter Date: 04/12/2015      PT End of Session - 04/12/15 0937    Visit Number 9   Number of Visits 17   Date for PT Re-Evaluation 05/07/15   Authorization Type MCR trad primary, BCBS secondary    PT Start Time 0932   PT Stop Time 1015   PT Time Calculation (min) 43 min   Activity Tolerance Patient tolerated treatment well   Behavior During Therapy [facility] for tasks assessed/performed      Past Medical History  Diagnosis Date  . Bradycardia   . Chronic abdominal pain   . Hesitancy of micturition     Past Surgical History  Procedure Laterality Date  . Anterior cervical decomp/discectomy fusion N/A 11/14/2014    Procedure: Cervical Five-Cervical Six  Anterior Cervical Decompression/Diskectomy/Fusion;  Surgeon: Erline Levine, MD;  Location: [facility];  Service: Neurosurgery;  Laterality: N/A;    There were no vitals filed for this visit.  Visit Diagnosis:  Abnormality of gait  Unsteadiness  Decreased strength of trunk and back  Decreased functional activity tolerance  C6 spinal cord injury, initial encounter [facility])      Subjective Assessment - 04/12/15 0935    Subjective Reports no change since last visit.     Limitations Walking;Standing;House hold activities;Sitting;Lifting   Patient Stated Goals "to walk without this walker"   Currently in Pain? Yes   Pain Score 3    Pain Location Arm   Pain Orientation Right;Left   Pain Descriptors / Indicators Throbbing;Tingling   Pain Type Chronic pain   Pain Onset More than a month ago   Pain Frequency Constant   Aggravating Factors  worse in am than pm   Pain Relieving Factors activity, pain meds, heat               Gait:  Continue to assess gait with use of SPC and without AD.  Performed x 230' with SPC with focus on posture, decreased L Trendelenburg and increasing gait speed to decrease fall risk and work towards [place].  Note he is able to perform at mod I level, however provided tactile cues at trunk for posture with cues for scapular retraction and depression as well as tactile cues at L hip for increased glute med activation during task.  Ended session with gait without AD in order to assess carryover from exercises performed during session.  Requires min A with continued facilitation at trunk for increased lumbar and thoracic extension with scap retraction and depression as well as light downward approximation at L pelvis to increase activation of glute med during L stance phase of gait.  Tolerated well, but unable to carryover without facilitation.    NMR:  In supine, performed LLE only bridging with L LE off of mat on 6" step and therapist holding RLE to decrease amount of use through RLE.  Performed x 2 sets of 10 reps with cues for slower speed and holding x 3 seconds.  Transitioned to performing BLE bridging on physioball x 10 reps (cues for clasping hands over chest to prevent over use of UEs) progressing to BLE bridge with hamstring curl x 10 reps.  During rest break, note that pt states "I notice the backs of my legs  are tight."  PT recalled tight hamstrings from last visit and therefore educated and demonstrated with pt how to stretch in supine (performed x 60 secs BLE) and in sitting (BLE x 60 secs).  Pt verbalized and returned demonstration.  Ended NMR with SL task to address back extensor weakness and decreased scapular mobility during gait.  Had pt perform PNF diagonal patterns with scapular movements and resistance added to inferior angle of scapula.  Performed x 5 reps with 5 second holds.  Also provided pt with manual scalene/upper trap stretch x 60 secs on each side.  Tolerated  well.                    PT Education - 04/12/15 [phone]    Education provided Yes   Education Details importance of posture with mobility.    Person(s) Educated Patient   Methods Explanation   Comprehension Verbalized understanding          PT Short Term Goals - 04/10/15 1455    PT SHORT TERM GOAL #1   Title Pt will initiate HEP for balance and strengthening in order to indicate decreased fall risk and improved functional mobility.  (Target Date: 04/05/15)   Baseline met 04/10/15   Status Achieved   PT SHORT TERM GOAL #2   Title Pt will increase gait speed to 2.03 ft/ sec in order to indicate decreased fall risk and improved efficiency of gait.     Baseline 1.68 ft/sec on 04/10/15   Status Partially Met   PT SHORT TERM GOAL #3   Title Pt will increase BERG balance test to 42/56 in order to indicate decreased fall risk and improved functional balance.    Baseline 50/56 on 04/10/15   Status Achieved   PT SHORT TERM GOAL #4   Title Pt will ambulate >150' on indoor surfaces w/ SPC at mod I level in order to indicate safe home negotiation.     Baseline 230' with SPC at mod I level on 04/10/15   Status Achieved   PT SHORT TERM GOAL #5   Title Pt will perform flight of stairs w/ single handrail in alternating pattern at mod I level in order to indicate safe negotiation in home.     Baseline met 04/10/15   Status Achieved   PT SHORT TERM GOAL #6   Title Pt will verbalize fall prevention strategies independently in order to better decrease risk of falling at home.     Baseline Pt needs mod cues to recall    Status Partially Met           PT Long Term Goals - 03/08/15 1228    PT LONG TERM GOAL #1   Title Pt will be independent with HEP for strengthening and balance in order to indicate improved functional mobility and decreased balance.  (Target Date: 05/02/14)   PT LONG TERM GOAL #2   Title Pt will increase gait speed to 2.63 ft/sec in order to indicate decreased fall risk  and and indicate pt is safe community ambulator.     PT LONG TERM GOAL #3   Title Pt will improve BERG balance test score to 46/56 in order to indicate decreased fall risk and improved functional balance.    PT LONG TERM GOAL #4   Title Pt will ambulate >500' without AD on paved outdoor surfaces and unlevel surfaces (grass) at mod I level in order to indicate safe community and outdoor home negotiation.     PT  LONG TERM GOAL #5   Title Pt will indicate return to leisure activities in order to indicate safe return to community functions.                 Plan - 04/12/15 3837    Clinical Impression Statement Skilled session focused on gait training with use of SPC and without device in order to improve postural control, gait speed, and quality of gait.  Also addressed scapular mobility in relation to posture to carryover to gait and all aspects of mobility.     Pt will benefit from skilled therapeutic intervention in order to improve on the following deficits Abnormal gait;Decreased activity tolerance;Decreased balance;Decreased coordination;Decreased endurance;Decreased mobility;Decreased strength;Impaired perceived functional ability;Impaired sensation;Impaired UE functional use;Improper body mechanics;Postural dysfunction;Pain   Rehab Potential Excellent   PT Frequency 2x / week   PT Duration 8 weeks   PT Treatment/Interventions ADLs/Self Care Home Management;Electrical Stimulation;DME Instruction;Gait training;Stair training;Functional mobility training;Therapeutic exercise;Therapeutic activities;Balance training;Neuromuscular re-education;Patient/family education;Manual techniques   PT Next Visit [place]!! Continue to assess gait with SPC, esp outdoors. Gait training with cueing for upright posture. Address selective control in LLE. Exercises for posture strengthening/control, trunk strengthening   Consulted and Agree with Plan of Care Patient        Problem List Patient Active  Problem List   Diagnosis Date Noted  . Orthostatic hypotension 11/30/2014  . C6 spinal cord injury ([place]) 11/20/2014  . Neurogenic bowel 11/20/2014  . Neurogenic bladder 11/20/2014  . Fever of unknown origin   . Quadriplegia ([place]) 11/14/2014  . C5 vertebral fracture ([place]) 11/09/2014    Cameron Sprang, PT, MPT [facility] [address] [place], [place], [address] Phone: [phone]   Fax:  [phone] 04/12/2015, 12:00 PM  Name: Carl [name] MRN: [id number] Date of Birth: January 14, 1951

## 2015-04-15 ENCOUNTER — Other Ambulatory Visit: Payer: Self-pay | Admitting: Physical Medicine & Rehabilitation

## 2015-04-17 ENCOUNTER — Ambulatory Visit: Payer: Medicare Other | Admitting: Occupational Therapy

## 2015-04-17 ENCOUNTER — Encounter: Payer: Self-pay | Admitting: Occupational Therapy

## 2015-04-17 ENCOUNTER — Ambulatory Visit: Payer: Medicare Other | Admitting: Rehabilitation

## 2015-04-17 ENCOUNTER — Encounter: Payer: Self-pay | Admitting: Rehabilitation

## 2015-04-17 DIAGNOSIS — R2681 Unsteadiness on feet: Secondary | ICD-10-CM

## 2015-04-17 DIAGNOSIS — R29898 Other symptoms and signs involving the musculoskeletal system: Secondary | ICD-10-CM

## 2015-04-17 DIAGNOSIS — R279 Unspecified lack of coordination: Secondary | ICD-10-CM

## 2015-04-17 DIAGNOSIS — R531 Weakness: Secondary | ICD-10-CM

## 2015-04-17 DIAGNOSIS — R269 Unspecified abnormalities of gait and mobility: Secondary | ICD-10-CM

## 2015-04-17 DIAGNOSIS — R6889 Other general symptoms and signs: Secondary | ICD-10-CM | POA: Diagnosis not present

## 2015-04-17 NOTE — Therapy (Signed)
[facility] [address] [place], [place], [address] Phone: [phone]   Fax:  [phone]  Physical Therapy Treatment  Patient Details  Name: Carl [name] MRN: [id number] Date of Birth: 03-20-1951 Referring Provider: Alger Simons, MD  Encounter Date: 04/17/2015      PT End of Session - 04/17/15 1107    Visit Number 10   Number of Visits 17   Date for PT Re-Evaluation 05/07/15   Authorization Type MCR trad primary, BCBS secondary    PT Start Time 1105   PT Stop Time 1150   PT Time Calculation (min) 45 min   Activity Tolerance Patient tolerated treatment well   Behavior During Therapy [facility] for tasks assessed/performed      Past Medical History  Diagnosis Date  . Bradycardia   . Chronic abdominal pain   . Hesitancy of micturition     Past Surgical History  Procedure Laterality Date  . Anterior cervical decomp/discectomy fusion N/A 11/14/2014    Procedure: Cervical Five-Cervical Six  Anterior Cervical Decompression/Diskectomy/Fusion;  Surgeon: Erline Levine, MD;  Location: [facility];  Service: Neurosurgery;  Laterality: N/A;    There were no vitals filed for this visit.  Visit Diagnosis:  Abnormality of gait  Unsteadiness  Decreased strength of trunk and back  Left-sided weakness      Subjective Assessment - 04/17/15 1105    Subjective "I went out a little in the weather, just out to eat a couple of times."    Limitations Walking;Standing;House hold activities;Sitting;Lifting   Patient Stated Goals "to walk without this walker"   Currently in Pain? Yes   Pain Score 4    Pain Location Arm   Pain Orientation Right;Left   Pain Descriptors / Indicators Throbbing;Tingling   Pain Type Chronic pain   Pain Radiating Towards shoulders/hands   Pain Onset More than a month ago   Pain Frequency Constant   Aggravating Factors  worse in the am   Pain Relieving Factors activity, pain meds, heat              NMR:  Addressed high level balance at beginning of session to work on LLE strengthening, stabilization and L SLS.  Performed cone tapping on red mat for increased challenge with cues for slowed tapping to increase time spent in SLS.  Pt tolerated well with light tactile cuing at L hip for increased glute med activation.  Progressed challenge by having pt tip cone over and back up to increase time spent in SLS and increase time activating L hip and knee flex.  Seated rest break following cone task.  Progressed to // bars with LLE standing on blue foam balance beam while R LE advanced over and back of beam, again to work on LLE strength, stabilization and activation of glute med all to carryover to improved gait pattern.  Performed 15 reps (5 with LUE support and 10 reps without UE support) with facilitation at trunk to prevent compensatory movements and at L hip to decrease lateral hip instability.  Then progressed to gait trainer on treadmill for NMR and visual feedback on step and stride length as well as working on postural control.  Performed 3 mins (no incline) at 0.7-0.9 mph with continuous cues for increased L step length, esp as pt began to fatigue, note L step length smaller and L foot drag seemed to increase.  Provided facilitation at trunk for posture throughout as well as intermittently at L hip to prevent Trendelenburg  and increase activation on L glute med.  Ended session with series of LLE strengthening for NMR as follows; LLE only bridging with LLE off EOM on 6" step, 2 sets of 10 reps, elevating LLE from floor to mat 2 sets of 10 reps, and RLE step ups/downs with LLE on 3" step and lightly tapping RLE to ground and back up.  Tolerated well but with moderate fatigue.    TE:  Performed corner pectoral stretch x 2 sets of 30 secs with min cues for safety and arm placement to prevent pain.                     PT Education - 04/17/15 1107    Education provided Yes    Education Details importance of normalized gait pattern and decreased compensatory strategies   Person(s) Educated Patient   Methods Explanation   Comprehension Verbalized understanding          PT Short Term Goals - 04/10/15 1455    PT SHORT TERM GOAL #1   Title Pt will initiate HEP for balance and strengthening in order to indicate decreased fall risk and improved functional mobility.  (Target Date: 04/05/15)   Baseline met 04/10/15   Status Achieved   PT SHORT TERM GOAL #2   Title Pt will increase gait speed to 2.03 ft/ sec in order to indicate decreased fall risk and improved efficiency of gait.     Baseline 1.68 ft/sec on 04/10/15   Status Partially Met   PT SHORT TERM GOAL #3   Title Pt will increase BERG balance test to 42/56 in order to indicate decreased fall risk and improved functional balance.    Baseline 50/56 on 04/10/15   Status Achieved   PT SHORT TERM GOAL #4   Title Pt will ambulate >150' on indoor surfaces w/ SPC at mod I level in order to indicate safe home negotiation.     Baseline 230' with SPC at mod I level on 04/10/15   Status Achieved   PT SHORT TERM GOAL #5   Title Pt will perform flight of stairs w/ single handrail in alternating pattern at mod I level in order to indicate safe negotiation in home.     Baseline met 04/10/15   Status Achieved   PT SHORT TERM GOAL #6   Title Pt will verbalize fall prevention strategies independently in order to better decrease risk of falling at home.     Baseline Pt needs mod cues to recall    Status Partially Met           PT Long Term Goals - 04/17/15 1209    PT LONG TERM GOAL #1   Title Pt will be independent with HEP for strengthening and balance in order to indicate improved functional mobility and decreased balance.  (Target Date: 05/02/14)   PT LONG TERM GOAL #2   Title Pt will increase gait speed to 2.63 ft/sec in order to indicate decreased fall risk and and indicate pt is safe community ambulator.     PT LONG  TERM GOAL #3   Title Pt will improve BERG balance test score to 46/56 in order to indicate decreased fall risk and improved functional balance.    Baseline 50/56 04/10/15   Status Achieved   PT LONG TERM GOAL #4   Title Pt will ambulate >500' without AD on paved outdoor surfaces and unlevel surfaces (grass) at mod I level in order to indicate safe community and outdoor  home negotiation.     PT LONG TERM GOAL #5   Title Pt will indicate return to leisure activities in order to indicate safe return to community functions.                 Plan - May 06, 2015 1107    Clinical Impression Statement Skilled session focused on high level balance, gait training on treadmill without device for NMR to improve gait pattern, and LLE strengthening/NMR to carryover to improved gait.    Pt will benefit from skilled therapeutic intervention in order to improve on the following deficits Abnormal gait;Decreased activity tolerance;Decreased balance;Decreased coordination;Decreased endurance;Decreased mobility;Decreased strength;Impaired perceived functional ability;Impaired sensation;Impaired UE functional use;Improper body mechanics;Postural dysfunction;Pain   Rehab Potential Excellent   PT Frequency 2x / week   PT Duration 8 weeks   PT Treatment/Interventions ADLs/Self Care Home Management;Electrical Stimulation;DME Instruction;Gait training;Stair training;Functional mobility training;Therapeutic exercise;Therapeutic activities;Balance training;Neuromuscular re-education;Patient/family education;Manual techniques   PT Next Visit Plan Continue to assess gait with SPC, esp outdoors. Gait training with cueing for upright posture. Address selective control in LLE. Exercises for posture strengthening/control, trunk strengthening   Consulted and Agree with Plan of Care Patient          G-Codes - 06-May-2015 1210    Functional Assessment Tool Used BERG: 50/56   Functional Limitation Mobility: Walking and moving  around   Mobility: Walking and Moving Around Current Status [phone]) At least 1 percent but less than 20 percent impaired, limited or restricted   Mobility: Walking and Moving Around Goal Status [phone]) At least 1 percent but less than 20 percent impaired, limited or restricted      Physical Therapy Progress Note  Dates of Reporting Period: 03/18/15 to 06-May-2015  Objective Reports of Subjective Statement: Pt continues to report pain in B arms (L>R), and increased stiffness in mornings, but is working to increase nights spent in his bed vs hospital bed.   Objective Measurements: BERG: 50/56 (taken 04/10/15)  Goal Update: See LTG's above  Plan: Continue POC  Reason Skilled Services are Required: Pt continues to demonstrate decreased sensation and decreased strength in LLE causing decreased balance and abnormality of gait.  Also continues to demonstrate poor postural control and decreased activity tolerance.       Problem List Patient Active Problem List   Diagnosis Date Noted  . Orthostatic hypotension 11/30/2014  . C6 spinal cord injury ([place]) 11/20/2014  . Neurogenic bowel 11/20/2014  . Neurogenic bladder 11/20/2014  . Fever of unknown origin   . Quadriplegia ([place]) 11/14/2014  . C5 vertebral fracture ([place]) 11/09/2014    Cameron Sprang, PT, MPT [facility] [address] [place], [place], [address] Phone: [phone]   Fax:  [phone] 05/06/2015, 12:11 PM  Name: Aylan Bayona MRN: [id number] Date of Birth: 1950/07/29

## 2015-04-17 NOTE — Patient Instructions (Signed)
   AROM: Wrist Flexion   With___Lt__ palm up, bend wrist up. Repeat __15__ times per set.  Do _4-6___ sessions per day.     Extension (Isometric)    Keeping forearm steady in palm-down position, use other hand to resist upward movement of hand at wrist. Hold __10__ seconds. Relax. Repeat __5__ times. Do __4-6__ sessions per day.

## 2015-04-17 NOTE — Telephone Encounter (Signed)
Carl Hashimotoatricia called about refill for tramadol. Refilled (called to pharmacy) and Mrs Carmical notified.

## 2015-04-17 NOTE — Therapy (Signed)
[facility] [address] [place], [place], [address] Phone: [phone]   Fax:  [phone]  Occupational Therapy Treatment  Patient Details  Name: Carl [name] MRN: [id number] Date of Birth: 08-11-1950 Referring Provider: Alger Simons  Encounter Date: 04/17/2015      OT End of Session - 04/17/15 1107    Visit Number 11   Number of Visits 17   Date for OT Re-Evaluation 05/02/14   Authorization Type MCR - G code needed   Authorization - Visit Number 11   Authorization - Number of Visits 20   OT Start Time 9163   OT Stop Time 1100   OT Time Calculation (min) 45 min   Activity Tolerance Patient tolerated treatment well      Past Medical History  Diagnosis Date  . Bradycardia   . Chronic abdominal pain   . Hesitancy of micturition     Past Surgical History  Procedure Laterality Date  . Anterior cervical decomp/discectomy fusion N/A 11/14/2014    Procedure: Cervical Five-Cervical Six  Anterior Cervical Decompression/Diskectomy/Fusion;  Surgeon: Erline Levine, MD;  Location: [facility];  Service: Neurosurgery;  Laterality: N/A;    There were no vitals filed for this visit.  Visit Diagnosis:  Lack of coordination  Decreased grip strength of left hand  Left-sided weakness      Subjective Assessment - 04/17/15 1018    Patient Stated Goals increase strength and decrease tingling   Currently in Pain? Yes   Pain Score 4    Pain Location Arm   Pain Orientation Right;Left   Pain Descriptors / Indicators Throbbing;Tingling   Pain Type Chronic pain   Pain Onset More than a month ago   Pain Frequency Constant   Aggravating Factors  worse in the am   Pain Relieving Factors activity, pain meds, heat                      OT Treatments/Exercises (OP) - 04/17/15 0001    Exercises   Exercises Wrist   Wrist Exercises   Other wrist exercises Wrist extension x 10 reps with 2 lb weight, but  occasionally "popping" in joint and therefore d/c exercise. Adapted for isometric wrist extension and performed x 5 reps holding 10 sec. Then shifted to wrist flexion, however pt extremely weak in wrist flexion and unable to do isometrically, therefore gave exercise in A/ROM x 10 reps. Pt issued HEP - see pt instructions   Hand Exercises   Other Hand Exercises Gripper set at 35 lbs. resistance to pick up blocks for sustained grip strength Lt hand with min drops, difficulty, fatigue and min compensations required. Pt with better grip on handle and not slipping as much as when last performed. Pt however did require 2 rest breaks, but did not have to decrease resistance   Fine Motor Coordination   Other Fine Motor Exercises Reviewed fine motor coordination HEP - pt return demo of each, but admits he hasn't been doing at home much    Other Fine Motor Exercises Pt placing medium sized pegs in pegboard manipulating 5 pegs at a time for in hand manipulation with mod difficulty and drops. (Pt only able to complete 2 rows due to time constraints)                 OT Education - 04/17/15 1030    Education provided Yes   Education Details wrist HEP    Person(s) Educated Patient  Methods Explanation;Demonstration;Handout   Comprehension Verbalized understanding;Returned demonstration          OT Short Term Goals - 04/10/15 1451    OT SHORT TERM GOAL #1   Title Pt will increase LUE gross grip to 30 lbs to help increase overall functional use and independence of L hand - 04/05/2015   Baseline 20lbs    Time 4   Period Weeks   Status Not Met  20 pounds 04/10/2015   OT SHORT TERM GOAL #2   Title Pt will be educated on trunk/core strengthening exercises to increase overall posture and strength in order to safely perform ADLs   Time 4   Period Weeks   Status Achieved   OT SHORT TERM GOAL #3   Title Pt will be educated on desensitization exercises to decrease hypersensitivity to LUE    Time 4    Period Weeks   Status Achieved   OT SHORT TERM GOAL #4   Title Pt will be independent with self correcting poor tecnique and compensatory strategies to left shoulder when functionally using LUE   Time 4   Period Weeks   Status Achieved           OT Long Term Goals - 04/10/15 1454    OT LONG TERM GOAL #1   Title Pt will increase LUE gross grip to 30 lbs to help increase overall functional use and independence of L hand - 05/03/2015   Baseline 20lbs at baseline   Time 8   Period Weeks   Status Revised   OT LONG TERM GOAL #2   Title Pt will be independent with LB ADLs   Baseline min assist    Time 8   Period Weeks   Status Achieved   OT LONG TERM GOAL #3   Title Pt will improve 9-hole peg test time to less than 32 seconds    Baseline 73 seconds   Time 8   Period Weeks   Status Revised  37.75 on 04/10/2015 therefore revised goal   OT LONG TERM GOAL #4   Title Pt will improve left wrist strength to 4/5 strength    Baseline 2+/5 strength    Time 8   Period Weeks   Status On-going   OT LONG TERM GOAL #5   Title Pt will improve lateral and three pinch strength by at least 5lbs   Baseline 7lbs for both lateal and three pinch   Time 8   Period Weeks   Status On-going               Plan - 04/17/15 1108    Clinical Impression Statement Pt with noted weakness with Lt wrist flexion in particular and still has difficulty with in hand manipulation Lt hand   Plan continue wrist flexion, work on tip and lateral pinch (clothespin activity), and postural control   Consulted and Agree with Plan of Care Patient        Problem List Patient Active Problem List   Diagnosis Date Noted  . Orthostatic hypotension 11/30/2014  . C6 spinal cord injury ([place]) 11/20/2014  . Neurogenic bowel 11/20/2014  . Neurogenic bladder 11/20/2014  . Fever of unknown origin   . Quadriplegia ([place]) 11/14/2014  . C5 vertebral fracture ([place]) 11/09/2014    Carey Bullocks, OTR/L 04/17/2015,  11:09 AM  [facility] [address] [place], [place], [address] Phone: [phone]   Fax:  [phone]  Name: Carl [name] MRN: [id number]  Date of Birth: [birth year]

## 2015-04-19 ENCOUNTER — Encounter: Payer: Self-pay | Admitting: Occupational Therapy

## 2015-04-19 ENCOUNTER — Encounter: Payer: Self-pay | Admitting: Rehabilitation

## 2015-04-19 ENCOUNTER — Ambulatory Visit: Payer: Medicare Other | Admitting: Rehabilitation

## 2015-04-19 DIAGNOSIS — R6889 Other general symptoms and signs: Secondary | ICD-10-CM | POA: Diagnosis not present

## 2015-04-19 DIAGNOSIS — R531 Weakness: Secondary | ICD-10-CM

## 2015-04-19 DIAGNOSIS — R29898 Other symptoms and signs involving the musculoskeletal system: Secondary | ICD-10-CM

## 2015-04-19 DIAGNOSIS — R2681 Unsteadiness on feet: Secondary | ICD-10-CM

## 2015-04-19 DIAGNOSIS — R269 Unspecified abnormalities of gait and mobility: Secondary | ICD-10-CM

## 2015-04-19 NOTE — Therapy (Signed)
[facility] [address] [place], [place], [address] Phone: [phone]   Fax:  [phone]  Physical Therapy Treatment  Patient Details  Name: Carl [name] MRN: [id number] Date of Birth: [birth year] Referring Provider: Alger Simons, MD  Encounter Date: 04/19/2015      PT End of Session - 04/19/15 1320    Visit Number 11   Number of Visits 17   Date for PT Re-Evaluation 05/07/15   Authorization Type MCR trad primary, BCBS secondary    PT Start Time 1315   PT Stop Time 1400   PT Time Calculation (min) 45 min   Activity Tolerance Patient tolerated treatment well   Behavior During Therapy [facility] for tasks assessed/performed      Past Medical History  Diagnosis Date  . Bradycardia   . Chronic abdominal pain   . Hesitancy of micturition     Past Surgical History  Procedure Laterality Date  . Anterior cervical decomp/discectomy fusion N/A 11/14/2014    Procedure: Cervical Five-Cervical Six  Anterior Cervical Decompression/Diskectomy/Fusion;  Surgeon: Erline Levine, MD;  Location: [facility];  Service: Neurosurgery;  Laterality: N/A;    There were no vitals filed for this visit.  Visit Diagnosis:  Abnormality of gait  Unsteadiness  Decreased strength of trunk and back  Left-sided weakness      Subjective Assessment - 04/19/15 1317    Subjective Reports no changes since last visit.    Patient is accompained by: Family member   Limitations Walking;Standing;House hold activities;Sitting;Lifting   Patient Stated Goals "to walk without this walker"   Currently in Pain? Yes   Pain Score 4    Pain Location Arm   Pain Orientation Right;Left   Pain Descriptors / Indicators Throbbing;Tingling   Pain Type Chronic pain   Pain Radiating Towards shoulders/hands   Pain Onset More than a month ago   Pain Frequency Constant   Aggravating Factors  worse in am   Pain Relieving Factors activity, pain meds, heat               TE:  Addressed supine and prone exercise to increase flexibility in anterior trunk and increase strength in trunk extension.  Performed prone on elbows with alternating UE extension x 2 sets of 10 reps.  Provided assist at stabilizing arm for safety and to prevent overt trunk compensation during exercise.  Tolerated well with c/o stretching in abdomen and mild pain in UE.  Then had pt perform prone on elbows (due to discomfort will full prone position) while performing bent knee hip extension for glute activation and trunk extension, 10 reps BLE.  Progressed to self thoracic mobilization and pectoral stretch over built up towel.  Note marked improvement in pectoral flexibility.  While on towel roll had pt work on resisted scapular retraction (B elbows pressing into mat to elevate upper trunk).  Performed 2 sets of 10 reps with cues for scapular retraction with downward movement of scapula, again all to carryover to improved posture and trunk control during gait.     Gait:  Ended session with gait on outdoor surfaces with SPC to assess safety with use in community.  Pt states that he is using SPC "most of the time" out in the community.  Performed 500' on unlevel paved surfaces at S to min/guard level.  Note pt with two instances of LOB requiring min/guard to correct for safety.   Discussed that PT feels that he should have close S from wife if  ambulating in community, esp in busier areas to prevent fall.  Pt states "I'm not going to fall, I'm very sure of that."  Feel that he will likely not abide PT recommendations.  Note that during gait, provided tactile and verbal cues for posture, increased L hip and knee flexion as he was ambulating with very rigid and extended leg today with increased L hip hike.                  PT Education - 04/19/15 1319    Education provided Yes   Education Details Education for close S when using SPC outdoors on paved surfaces, recommendations on  not using SPC on grass/gravel, etc.    Person(s) Educated Patient   Methods Explanation   Comprehension Verbalized understanding          PT Short Term Goals - 04/10/15 1455    PT SHORT TERM GOAL #1   Title Pt will initiate HEP for balance and strengthening in order to indicate decreased fall risk and improved functional mobility.  (Target Date: 04/05/15)   Baseline met 04/10/15   Status Achieved   PT SHORT TERM GOAL #2   Title Pt will increase gait speed to 2.03 ft/ sec in order to indicate decreased fall risk and improved efficiency of gait.     Baseline 1.68 ft/sec on 04/10/15   Status Partially Met   PT SHORT TERM GOAL #3   Title Pt will increase BERG balance test to 42/56 in order to indicate decreased fall risk and improved functional balance.    Baseline 50/56 on 04/10/15   Status Achieved   PT SHORT TERM GOAL #4   Title Pt will ambulate >150' on indoor surfaces w/ SPC at mod I level in order to indicate safe home negotiation.     Baseline 230' with SPC at mod I level on 04/10/15   Status Achieved   PT SHORT TERM GOAL #5   Title Pt will perform flight of stairs w/ single handrail in alternating pattern at mod I level in order to indicate safe negotiation in home.     Baseline met 04/10/15   Status Achieved   PT SHORT TERM GOAL #6   Title Pt will verbalize fall prevention strategies independently in order to better decrease risk of falling at home.     Baseline Pt needs mod cues to recall    Status Partially Met           PT Long Term Goals - 04/17/15 1209    PT LONG TERM GOAL #1   Title Pt will be independent with HEP for strengthening and balance in order to indicate improved functional mobility and decreased balance.  (Target Date: 05/02/14)   PT LONG TERM GOAL #2   Title Pt will increase gait speed to 2.63 ft/sec in order to indicate decreased fall risk and and indicate pt is safe community ambulator.     PT LONG TERM GOAL #3   Title Pt will improve BERG balance test  score to 46/56 in order to indicate decreased fall risk and improved functional balance.    Baseline 50/56 04/10/15   Status Achieved   PT LONG TERM GOAL #4   Title Pt will ambulate >500' without AD on paved outdoor surfaces and unlevel surfaces (grass) at mod I level in order to indicate safe community and outdoor home negotiation.     PT LONG TERM GOAL #5   Title Pt will indicate return to leisure activities  in order to indicate safe return to community functions.                 Plan - 04/19/15 1320    Clinical Impression Statement Skilled session focused on activation of upper trunk extension, scapular retraction, self thoracic mobilization, LLE strength and gait on outdoor paved surfaces with SPC to assess safety.     Pt will benefit from skilled therapeutic intervention in order to improve on the following deficits Abnormal gait;Decreased activity tolerance;Decreased balance;Decreased coordination;Decreased endurance;Decreased mobility;Decreased strength;Impaired perceived functional ability;Impaired sensation;Impaired UE functional use;Improper body mechanics;Postural dysfunction;Pain   Rehab Potential Excellent   PT Frequency 2x / week   PT Duration 8 weeks   PT Treatment/Interventions ADLs/Self Care Home Management;Electrical Stimulation;DME Instruction;Gait training;Stair training;Functional mobility training;Therapeutic exercise;Therapeutic activities;Balance training;Neuromuscular re-education;Patient/family education;Manual techniques   PT Next Visit Plan Continue to assess gait with SPC, esp outdoors. Gait training with cueing for upright posture, improved quality (needs glute med activation, isolated hip and knee flex. Address selective control in LLE. Exercises for posture strengthening/control, trunk strengthening   Consulted and Agree with Plan of Care Patient        Problem List Patient Active Problem List   Diagnosis Date Noted  . Orthostatic hypotension 11/30/2014   . C6 spinal cord injury ([place]) 11/20/2014  . Neurogenic bowel 11/20/2014  . Neurogenic bladder 11/20/2014  . Fever of unknown origin   . Quadriplegia ([place]) 11/14/2014  . C5 vertebral fracture ([place]) 11/09/2014    Cameron Sprang, PT, MPT [facility] [address] [place], [place], [address] Phone: [phone]   Fax:  [phone] 04/19/2015, 2:51 PM  Name: Carl [name] MRN: [id number] Date of Birth: [birth year]

## 2015-04-23 ENCOUNTER — Encounter: Payer: Self-pay | Admitting: Rehabilitation

## 2015-04-23 ENCOUNTER — Ambulatory Visit: Payer: Medicare Other | Admitting: Occupational Therapy

## 2015-04-23 ENCOUNTER — Ambulatory Visit: Payer: Medicare Other | Admitting: Rehabilitation

## 2015-04-23 DIAGNOSIS — R2681 Unsteadiness on feet: Secondary | ICD-10-CM

## 2015-04-23 DIAGNOSIS — R29898 Other symptoms and signs involving the musculoskeletal system: Secondary | ICD-10-CM

## 2015-04-23 DIAGNOSIS — R531 Weakness: Secondary | ICD-10-CM

## 2015-04-23 DIAGNOSIS — R269 Unspecified abnormalities of gait and mobility: Secondary | ICD-10-CM

## 2015-04-23 DIAGNOSIS — R6889 Other general symptoms and signs: Secondary | ICD-10-CM | POA: Diagnosis not present

## 2015-04-23 NOTE — Therapy (Signed)
[facility] [address] [place], [place], [address] Phone: [phone]   Fax:  [phone]  Physical Therapy Treatment  Patient Details  Name: Carl [name] MRN: [id number] Date of Birth: Jun 20, 1950 Referring Provider: Alger Simons, MD  Encounter Date: 04/23/2015      PT End of Session - 04/23/15 1020    Visit Number 12   Number of Visits 17   Date for PT Re-Evaluation 05/07/15   Authorization Type MCR trad primary, BCBS secondary    PT Start Time 1015   PT Stop Time 1100   PT Time Calculation (min) 45 min   Activity Tolerance Patient tolerated treatment well   Behavior During Therapy [facility] for tasks assessed/performed      Past Medical History  Diagnosis Date  . Bradycardia   . Chronic abdominal pain   . Hesitancy of micturition     Past Surgical History  Procedure Laterality Date  . Anterior cervical decomp/discectomy fusion N/A 11/14/2014    Procedure: Cervical Five-Cervical Six  Anterior Cervical Decompression/Diskectomy/Fusion;  Surgeon: Erline Levine, MD;  Location: [facility];  Service: Neurosurgery;  Laterality: N/A;    There were no vitals filed for this visit.  Visit Diagnosis:  Abnormality of gait  Unsteadiness  Left-sided weakness  Decreased strength of trunk and back  Generalized weakness      Subjective Assessment - 04/23/15 1018    Subjective Reports no changes since visit, no falls, reports that wife is with him when using cane.    Patient is accompained by: Family member   Limitations Walking;Standing;House hold activities;Sitting;Lifting   Patient Stated Goals "to walk without this walker"   Currently in Pain? Yes   Pain Score 4    Pain Location Arm   Pain Orientation Right;Left   Pain Descriptors / Indicators Throbbing;Tingling;Other (Comment)  stiffness   Pain Type Chronic pain   Pain Onset More than a month ago   Pain Frequency Constant   Aggravating Factors  worse in  the am   Pain Relieving Factors activity, pain meds, heat   Pain Score 2   Pain Location Leg   Pain Orientation Left;Right  L>R   Pain Descriptors / Indicators Numbness   Pain Type Chronic pain   Pain Onset More than a month ago             Gait:  Assessed gait with use of L foot up brace for DF assist and prevent foot drag, esp when fatigued.  Had pt ambulate x  460' at S level with SPC and foot up brace.  Note marked improvement when ability to clear foot, esp when fatigued.  He did have two instances of toe drag, however feel that he would likely be more safe with use of brace, esp as progressing to gait without cane.  Educated pt on how to obtain on [employer] and provided handout from [facility] on which brace to order and what size.  Pt verbalized understanding.    NMR;  Worked in // bars for continued LLE strengthening and stabilization; LLE planted on blue foam balance beam advancing RLE to 6" step and back down with tactile cues at L glute for increased activation x 15 reps (5 with single UE support and 10 without UE support).  Provided use of mirror for increased visual feedback on posture.  Then performed with RLE stabilizing on blue foam while LLE advanced to step (with half disc to add height and challenge for L hip/knee flex)  x 10 reps without UE support.  Note last two reps very difficult for pt to perform due to LLE fatigue.  Transitioned to tall kneeling tasks for increased LLE stabilization and activation of glute med; mini squats x 10 reps with cues for improved postural control during task (x 5 with UE support and x 10 without UE support), side stepping in tall kneeling x 3 reps down and back (length of mat approx 5-6'), ending with transition from tall kneeling to R half kneeling to increase challenge of LLE.  Performed x 5 reps with heavy support needed for posture and weight shift to the L, and activation of LLE.   Pt very fatigued following exercise.  Note difficulty with  transition into floor as well as getting back to mat, therefore discussed continued practice in therapy to address fall recovery.  Pt verbalized understanding.                      PT Education - 04/23/15 1020    Education provided Yes   Education Details practicing fall recovery in therapy only at this time   Person(s) Educated Patient   Methods Explanation   Comprehension Verbalized understanding          PT Short Term Goals - 04/10/15 1455    PT SHORT TERM GOAL #1   Title Pt will initiate HEP for balance and strengthening in order to indicate decreased fall risk and improved functional mobility.  (Target Date: 04/05/15)   Baseline met 04/10/15   Status Achieved   PT SHORT TERM GOAL #2   Title Pt will increase gait speed to 2.03 ft/ sec in order to indicate decreased fall risk and improved efficiency of gait.     Baseline 1.68 ft/sec on 04/10/15   Status Partially Met   PT SHORT TERM GOAL #3   Title Pt will increase BERG balance test to 42/56 in order to indicate decreased fall risk and improved functional balance.    Baseline 50/56 on 04/10/15   Status Achieved   PT SHORT TERM GOAL #4   Title Pt will ambulate >150' on indoor surfaces w/ SPC at mod I level in order to indicate safe home negotiation.     Baseline 230' with SPC at mod I level on 04/10/15   Status Achieved   PT SHORT TERM GOAL #5   Title Pt will perform flight of stairs w/ single handrail in alternating pattern at mod I level in order to indicate safe negotiation in home.     Baseline met 04/10/15   Status Achieved   PT SHORT TERM GOAL #6   Title Pt will verbalize fall prevention strategies independently in order to better decrease risk of falling at home.     Baseline Pt needs mod cues to recall    Status Partially Met           PT Long Term Goals - 04/17/15 1209    PT LONG TERM GOAL #1   Title Pt will be independent with HEP for strengthening and balance in order to indicate improved functional  mobility and decreased balance.  (Target Date: 05/02/14)   PT LONG TERM GOAL #2   Title Pt will increase gait speed to 2.63 ft/sec in order to indicate decreased fall risk and and indicate pt is safe community ambulator.     PT LONG TERM GOAL #3   Title Pt will improve BERG balance test score to 46/56 in order to indicate decreased  fall risk and improved functional balance.    Baseline 50/56 04/10/15   Status Achieved   PT LONG TERM GOAL #4   Title Pt will ambulate >500' without AD on paved outdoor surfaces and unlevel surfaces (grass) at mod I level in order to indicate safe community and outdoor home negotiation.     PT LONG TERM GOAL #5   Title Pt will indicate return to leisure activities in order to indicate safe return to community functions.                 Plan - 04/23/15 1020    Clinical Impression Statement Skilled session focused on assessment of gait with use of foot up  brace for improved foot clearance.  Note that after 4 laps with SPC, foot up brace does assist and recommend he get one for longer distances.  Educated and provided handout on how to order.  Continued to work on [employer] for LLE strengthening and stabilization during session.     Pt will benefit from skilled therapeutic intervention in order to improve on the following deficits Abnormal gait;Decreased activity tolerance;Decreased balance;Decreased coordination;Decreased endurance;Decreased mobility;Decreased strength;Impaired perceived functional ability;Impaired sensation;Impaired UE functional use;Improper body mechanics;Postural dysfunction;Pain   Rehab Potential Excellent   PT Frequency 2x / week   PT Duration 8 weeks   PT Treatment/Interventions ADLs/Self Care Home Management;Electrical Stimulation;DME Instruction;Gait training;Stair training;Functional mobility training;Therapeutic exercise;Therapeutic activities;Balance training;Neuromuscular re-education;Patient/family education;Manual techniques   PT Next  Visit Plan Continue to assess gait with SPC outdoors. Gait indoors without device for NMR, Gait training with cueing for upright posture, improved quality (needs glute med activation, isolated hip and knee flex. Address selective control in LLE. Exercises for posture strengthening/control, trunk strengthening   Consulted and Agree with Plan of Care Patient        Problem List Patient Active Problem List   Diagnosis Date Noted  . Orthostatic hypotension 11/30/2014  . C6 spinal cord injury ([place]) 11/20/2014  . Neurogenic bowel 11/20/2014  . Neurogenic bladder 11/20/2014  . Fever of unknown origin   . Quadriplegia ([place]) 11/14/2014  . C5 vertebral fracture ([place]) 11/09/2014    Cameron Sprang, PT, MPT [facility] [address] [place], [place], [address] Phone: [phone]   Fax:  [phone] 04/23/2015, 12:52 PM  Name: Carl [name] MRN: [id number] Date of Birth: [birth year]

## 2015-04-23 NOTE — Therapy (Signed)
[facility] [address] [place], [place], [address] Phone: [phone]   Fax:  [phone]  Occupational Therapy Treatment  Patient Details  Name: Carl [name] MRN: [id number] Date of Birth: Apr 16, 1950 Referring Provider: Alger Simons  Encounter Date: 04/23/2015      OT End of Session - 04/23/15 1144    Visit Number 12   Number of Visits 17   Date for OT Re-Evaluation 05/02/14   Authorization Type MCR - G code needed   Authorization - Visit Number 12   Authorization - Number of Visits 20   OT Start Time 1100   OT Stop Time 1140   OT Time Calculation (min) 40 min   Activity Tolerance Patient tolerated treatment well      Past Medical History  Diagnosis Date  . Bradycardia   . Chronic abdominal pain   . Hesitancy of micturition     Past Surgical History  Procedure Laterality Date  . Anterior cervical decomp/discectomy fusion N/A 11/14/2014    Procedure: Cervical Five-Cervical Six  Anterior Cervical Decompression/Diskectomy/Fusion;  Surgeon: Erline Levine, MD;  Location: [facility];  Service: Neurosurgery;  Laterality: N/A;    There were no vitals filed for this visit.  Visit Diagnosis:  Left-sided weakness  Decreased grip strength of left hand      Subjective Assessment - 04/23/15 1108    Patient Stated Goals increase strength and decrease tingling   Currently in Pain? Yes   Pain Score 4    Pain Location Arm   Pain Orientation Right;Left   Pain Descriptors / Indicators Aching;Tingling   Pain Type Chronic pain   Pain Onset More than a month ago   Pain Frequency Constant   Aggravating Factors  worse in the am, cold weather   Pain Relieving Factors activity, pain meds, heat                      OT Treatments/Exercises (OP) - 04/23/15 0001    Wrist Exercises   Other wrist exercises active wrist flexion x 10 reps, holding 5 sec. in gravity elim. plane   Hand Exercises   Other  Hand Exercises Pt placing clothespins on antenna Lt hand for pinch strength and LUE AROM in high level sh. flexion. Pt able to put on/take off clothespins yellow to black resistance. Required min cues for shoulder positioning to avoid sh. abduction   Neurological Re-education Exercises   Other Exercises 1 high level AA/ROM sh. flexion using UE Ranger with min cues for positioning   Manual Therapy   Manual Therapy Passive ROM   Manual therapy comments In Lt wrist flexion, followed by composite finger flexion (with hot pack to Lt shoulder) all at beginning of session                   OT Short Term Goals - 04/10/15 1451    OT SHORT TERM GOAL #1   Title Pt will increase LUE gross grip to 30 lbs to help increase overall functional use and independence of L hand - 04/05/2015   Baseline 20lbs    Time 4   Period Weeks   Status Not Met  20 pounds 04/10/2015   OT SHORT TERM GOAL #2   Title Pt will be educated on trunk/core strengthening exercises to increase overall posture and strength in order to safely perform ADLs   Time 4   Period Weeks   Status Achieved   OT SHORT TERM GOAL #  3   Title Pt will be educated on desensitization exercises to decrease hypersensitivity to LUE    Time 4   Period Weeks   Status Achieved   OT SHORT TERM GOAL #4   Title Pt will be independent with self correcting poor tecnique and compensatory strategies to left shoulder when functionally using LUE   Time 4   Period Weeks   Status Achieved           OT Long Term Goals - 04/10/15 1454    OT LONG TERM GOAL #1   Title Pt will increase LUE gross grip to 30 lbs to help increase overall functional use and independence of L hand - 05/03/2015   Baseline 20lbs at baseline   Time 8   Period Weeks   Status Revised   OT LONG TERM GOAL #2   Title Pt will be independent with LB ADLs   Baseline min assist    Time 8   Period Weeks   Status Achieved   OT LONG TERM GOAL #3   Title Pt will improve 9-hole peg  test time to less than 32 seconds    Baseline 73 seconds   Time 8   Period Weeks   Status Revised  37.75 on 04/10/2015 therefore revised goal   OT LONG TERM GOAL #4   Title Pt will improve left wrist strength to 4/5 strength    Baseline 2+/5 strength    Time 8   Period Weeks   Status On-going   OT LONG TERM GOAL #5   Title Pt will improve lateral and three pinch strength by at least 5lbs   Baseline 7lbs for both lateal and three pinch   Time 8   Period Weeks   Status On-going               Plan - 04/23/15 1145    Clinical Impression Statement Pt with improved overall LUE function, but with limited wrist flexion ROM and strength.    Plan postural control (on ball) with LUE functional reaching/coordination   OT Home Exercise Plan coordination and putty HEP, desensitization techniques 03/14/15, theraband HEP 03/16/15   Consulted and Agree with Plan of Care Patient        Problem List Patient Active Problem List   Diagnosis Date Noted  . Orthostatic hypotension 11/30/2014  . C6 spinal cord injury ([place]) 11/20/2014  . Neurogenic bowel 11/20/2014  . Neurogenic bladder 11/20/2014  . Fever of unknown origin   . Quadriplegia ([place]) 11/14/2014  . C5 vertebral fracture ([place]) 11/09/2014    Carey Bullocks, OTR/L 04/23/2015, 11:47 AM  [facility] [address] [place], [place], [address] Phone: [phone]   Fax:  [phone]  Name: Carl [name] MRN: [id number] Date of Birth: 07/14/1950

## 2015-04-26 ENCOUNTER — Encounter: Payer: Self-pay | Admitting: Occupational Therapy

## 2015-04-26 ENCOUNTER — Ambulatory Visit: Payer: Medicare Other | Admitting: Rehabilitation

## 2015-04-26 ENCOUNTER — Encounter: Payer: Self-pay | Admitting: Rehabilitation

## 2015-04-26 ENCOUNTER — Ambulatory Visit: Payer: Medicare Other | Admitting: Occupational Therapy

## 2015-04-26 DIAGNOSIS — R6889 Other general symptoms and signs: Secondary | ICD-10-CM | POA: Diagnosis not present

## 2015-04-26 DIAGNOSIS — R269 Unspecified abnormalities of gait and mobility: Secondary | ICD-10-CM

## 2015-04-26 DIAGNOSIS — R29898 Other symptoms and signs involving the musculoskeletal system: Secondary | ICD-10-CM

## 2015-04-26 DIAGNOSIS — R2681 Unsteadiness on feet: Secondary | ICD-10-CM

## 2015-04-26 DIAGNOSIS — R531 Weakness: Secondary | ICD-10-CM

## 2015-04-26 NOTE — Therapy (Signed)
[facility] [address] [place], [place], [address] Phone: [phone]   Fax:  [phone]  Occupational Therapy Treatment  Patient Details  Name: Carl [name] MRN: [id number] Date of Birth: 10-27-1950 Referring Provider: Alger Simons  Encounter Date: 04/26/2015      OT End of Session - 04/26/15 1131    Visit Number 13   Number of Visits 17   Date for OT Re-Evaluation 05/02/14   Authorization Type MCR - G code needed   Authorization - Visit Number 13   Authorization - Number of Visits 20   OT Start Time 1020   OT Stop Time 1103   OT Time Calculation (min) 43 min   Activity Tolerance Patient tolerated treatment well      Past Medical History  Diagnosis Date  . Bradycardia   . Chronic abdominal pain   . Hesitancy of micturition     Past Surgical History  Procedure Laterality Date  . Anterior cervical decomp/discectomy fusion N/A 11/14/2014    Procedure: Cervical Five-Cervical Six  Anterior Cervical Decompression/Diskectomy/Fusion;  Surgeon: Erline Levine, MD;  Location: [facility];  Service: Neurosurgery;  Laterality: N/A;    There were no vitals filed for this visit.  Visit Diagnosis:  Decreased strength of trunk and back  Left-sided weakness  Generalized weakness      Subjective Assessment - 04/26/15 1024    Subjective  Getting out of bed is a little easier   Patient Stated Goals increase strength and decrease tingling   Currently in Pain? Yes   Pain Score 4    Pain Location Arm   Pain Orientation Right;Left   Pain Descriptors / Indicators Aching;Tingling   Pain Type Chronic pain   Pain Onset More than a month ago   Pain Frequency Constant   Aggravating Factors  worse in the am, cold weather   Pain Relieving Factors activity, pain meds, heat                      OT Treatments/Exercises (OP) - 04/26/15 0001    Neurological Re-education Exercises   Other Exercises 1 On  physioball - working on A/P pelvic tilts, disassociation of trunk/pelvis, lateral trunk flexion bilaterally and trunk righting reactions placed outside BOS. Followed by high level reaching LUE for coordination and cues for correct positioning to place large pegs on pegboard vertical surface.    Other Exercises 2 Supine: trunk stretch in extension, followed by lower lumbar and upper body stretch. Progressed to long sitting with scapula depression and retraction in UE wt bearing. Progressed to seated position for lats stretch in high BUE shoulder flexion and lateral trunk flexion bilaterally, followed by active sh. abduction reaching outside seated BOS for activation of lateral trunk flexion                  OT Short Term Goals - 04/10/15 1451    OT SHORT TERM GOAL #1   Title Pt will increase LUE gross grip to 30 lbs to help increase overall functional use and independence of L hand - 04/05/2015   Baseline 20lbs    Time 4   Period Weeks   Status Not Met  20 pounds 04/10/2015   OT SHORT TERM GOAL #2   Title Pt will be educated on trunk/core strengthening exercises to increase overall posture and strength in order to safely perform ADLs   Time 4   Period Weeks   Status Achieved   OT  SHORT TERM GOAL #3   Title Pt will be educated on desensitization exercises to decrease hypersensitivity to LUE    Time 4   Period Weeks   Status Achieved   OT SHORT TERM GOAL #4   Title Pt will be independent with self correcting poor tecnique and compensatory strategies to left shoulder when functionally using LUE   Time 4   Period Weeks   Status Achieved           OT Long Term Goals - 04/10/15 1454    OT LONG TERM GOAL #1   Title Pt will increase LUE gross grip to 30 lbs to help increase overall functional use and independence of L hand - 05/03/2015   Baseline 20lbs at baseline   Time 8   Period Weeks   Status Revised   OT LONG TERM GOAL #2   Title Pt will be independent with LB ADLs    Baseline min assist    Time 8   Period Weeks   Status Achieved   OT LONG TERM GOAL #3   Title Pt will improve 9-hole peg test time to less than 32 seconds    Baseline 73 seconds   Time 8   Period Weeks   Status Revised  37.75 on 04/10/2015 therefore revised goal   OT LONG TERM GOAL #4   Title Pt will improve left wrist strength to 4/5 strength    Baseline 2+/5 strength    Time 8   Period Weeks   Status On-going   OT LONG TERM GOAL #5   Title Pt will improve lateral and three pinch strength by at least 5lbs   Baseline 7lbs for both lateal and three pinch   Time 8   Period Weeks   Status On-going               Plan - 04/26/15 1132    Clinical Impression Statement Pt progressing with trunk control and mobility. Pt with overall increased use of LUE but still limited wrist flexion and grip strength   Plan continue lats stretch seated, long sitting stretch with trunk extension/wt bearing, coordination and grip strength Lt hand   OT Home Exercise Plan coordination and putty HEP, desensitization techniques 03/14/15, theraband HEP 03/16/15   Consulted and Agree with Plan of Care Patient        Problem List Patient Active Problem List   Diagnosis Date Noted  . Orthostatic hypotension 11/30/2014  . C6 spinal cord injury ([place]) 11/20/2014  . Neurogenic bowel 11/20/2014  . Neurogenic bladder 11/20/2014  . Fever of unknown origin   . Quadriplegia ([place]) 11/14/2014  . C5 vertebral fracture ([place]) 11/09/2014    Carey Bullocks, OTR/L 04/26/2015, 11:34 AM  [name] [address] [place], [place], [address] Phone: [phone]   Fax:  [phone]  Name: Carl [name] MRN: [id number] Date of Birth: Mar 02, 1951

## 2015-04-26 NOTE — Therapy (Signed)
[facility] [address] [place], [place], [address] Phone: [phone]   Fax:  [phone]  Physical Therapy Treatment  Patient Details  Name: Carl [name] MRN: [id number] Date of Birth: [birth year] Referring Provider: Alger Simons, MD  Encounter Date: 04/26/2015      PT End of Session - 04/26/15 1106    Visit Number 13   Number of Visits 17   Date for PT Re-Evaluation 05/07/15   Authorization Type MCR trad primary, BCBS secondary    PT Start Time 1100   PT Stop Time 1145   PT Time Calculation (min) 45 min   Activity Tolerance Patient tolerated treatment well   Behavior During Therapy [facility] for tasks assessed/performed      Past Medical History  Diagnosis Date  . Bradycardia   . Chronic abdominal pain   . Hesitancy of micturition     Past Surgical History  Procedure Laterality Date  . Anterior cervical decomp/discectomy fusion N/A 11/14/2014    Procedure: Cervical Five-Cervical Six  Anterior Cervical Decompression/Diskectomy/Fusion;  Surgeon: Erline Levine, MD;  Location: [facility];  Service: Neurosurgery;  Laterality: N/A;    There were no vitals filed for this visit.  Visit Diagnosis:  Abnormality of gait  Unsteadiness  Left-sided weakness  Generalized weakness      Subjective Assessment - 04/26/15 1105    Subjective Reports no changes since last visit.    Limitations Walking;Standing;House hold activities;Sitting;Lifting   Patient Stated Goals "to walk without this walker"   Currently in Pain? Yes   Pain Score 3    Pain Location Arm   Pain Orientation Right;Left   Pain Descriptors / Indicators Aching;Tingling;Numbness   Pain Type Chronic pain   Pain Onset More than a month ago   Pain Frequency Constant            Gait: Addressed gait without AD in order to work towards LTG and improve quality of gait.  Performed 13' without AD at S to min A level.  Pt mostly S however at times,  esp due to increased fatigue requires min A to prevent overt LOB with scissored gait.  Tactile and verbal cues throughout for upright posture, increased forward weight shift over LLE, and increased hip and knee flexion to better clear LLE.  Note that pt had foot up brace donned throughout all activities and has purchased one for home use to decrease chance of fall.    NMR: Continue to address LLE NMR with L knee on mat reaching overhead elevating RLE from floor to increase WB and weight shift through LLE.  Provided facilitation at L hip for increased glute med activation and max encouragement to sustain activation for 5-10 secs at a time.  Progressed to reaching task at counter top with RLE on 6" step to decrease use while reaching overhead with LUE, cues to "push" and elevate body through LLE, again to activate glute muscles for increased sustained activation to carry over to gait.  Note that during task, pt became dizzy and hypotensive.  BP was 98/58 and states this is lower than normal.  Water and rest provided and re-checked and was 110/61 with pt reporting feeling better.  Assisted to front to ensure safety.                       PT Education - 04/26/15 1106    Education provided Yes   Education Details extending POC through Feb   Person(s)  Educated Patient   Methods Explanation   Comprehension Verbalized understanding          PT Short Term Goals - 04/10/15 1455    PT SHORT TERM GOAL #1   Title Pt will initiate HEP for balance and strengthening in order to indicate decreased fall risk and improved functional mobility.  (Target Date: 04/05/15)   Baseline met 04/10/15   Status Achieved   PT SHORT TERM GOAL #2   Title Pt will increase gait speed to 2.03 ft/ sec in order to indicate decreased fall risk and improved efficiency of gait.     Baseline 1.68 ft/sec on 04/10/15   Status Partially Met   PT SHORT TERM GOAL #3   Title Pt will increase BERG balance test to 42/56 in order  to indicate decreased fall risk and improved functional balance.    Baseline 50/56 on 04/10/15   Status Achieved   PT SHORT TERM GOAL #4   Title Pt will ambulate >150' on indoor surfaces w/ SPC at mod I level in order to indicate safe home negotiation.     Baseline 230' with SPC at mod I level on 04/10/15   Status Achieved   PT SHORT TERM GOAL #5   Title Pt will perform flight of stairs w/ single handrail in alternating pattern at mod I level in order to indicate safe negotiation in home.     Baseline met 04/10/15   Status Achieved   PT SHORT TERM GOAL #6   Title Pt will verbalize fall prevention strategies independently in order to better decrease risk of falling at home.     Baseline Pt needs mod cues to recall    Status Partially Met           PT Long Term Goals - 04/17/15 1209    PT LONG TERM GOAL #1   Title Pt will be independent with HEP for strengthening and balance in order to indicate improved functional mobility and decreased balance.  (Target Date: 05/02/14)   PT LONG TERM GOAL #2   Title Pt will increase gait speed to 2.63 ft/sec in order to indicate decreased fall risk and and indicate pt is safe community ambulator.     PT LONG TERM GOAL #3   Title Pt will improve BERG balance test score to 46/56 in order to indicate decreased fall risk and improved functional balance.    Baseline 50/56 04/10/15   Status Achieved   PT LONG TERM GOAL #4   Title Pt will ambulate >500' without AD on paved outdoor surfaces and unlevel surfaces (grass) at mod I level in order to indicate safe community and outdoor home negotiation.     PT LONG TERM GOAL #5   Title Pt will indicate return to leisure activities in order to indicate safe return to community functions.                 Plan - 04/26/15 1106    Clinical Impression Statement Skilled session focused on gait training without AD for increased safety in home (working towards goals) and for [employer].   Also continue to address postural  control and decreased activation of glute med with NMR exercises.  Note became hypotensive during session with BP of 98/58, therefore allowed lengthy rest break, provided with water and BP elevated to 110/61 with pt reporting feeling much better.     Pt will benefit from skilled therapeutic intervention in order to improve on the following deficits Abnormal gait;Decreased activity  tolerance;Decreased balance;Decreased coordination;Decreased endurance;Decreased mobility;Decreased strength;Impaired perceived functional ability;Impaired sensation;Impaired UE functional use;Improper body mechanics;Postural dysfunction;Pain   Rehab Potential Excellent   PT Frequency 2x / week   PT Duration 8 weeks   PT Treatment/Interventions ADLs/Self Care Home Management;Electrical Stimulation;DME Instruction;Gait training;Stair training;Functional mobility training;Therapeutic exercise;Therapeutic activities;Balance training;Neuromuscular re-education;Patient/family education;Manual techniques   PT Next Visit Plan Continue to assess gait with SPC outdoors. Gait indoors without device for NMR, Gait training with cueing for upright posture, improved quality (needs glute med activation, isolated hip and knee flex. Address selective control in LLE. Exercises for posture strengthening/control, trunk strengthening   Consulted and Agree with Plan of Care Patient        Problem List Patient Active Problem List   Diagnosis Date Noted  . Orthostatic hypotension 11/30/2014  . C6 spinal cord injury ([place]) 11/20/2014  . Neurogenic bowel 11/20/2014  . Neurogenic bladder 11/20/2014  . Fever of unknown origin   . Quadriplegia ([place]) 11/14/2014  . C5 vertebral fracture ([place]) 11/09/2014    Cameron Sprang, PT, MPT [facility] [address] [place], [place], [address] Phone: [phone]   Fax:  [phone] 04/26/2015, 1:14 PM  Name: Carl [name] MRN: [id number] Date of Birth:  November 15, 1950

## 2015-05-01 ENCOUNTER — Ambulatory Visit: Payer: Medicare Other | Admitting: Rehabilitation

## 2015-05-01 ENCOUNTER — Encounter: Payer: Self-pay | Admitting: Occupational Therapy

## 2015-05-01 ENCOUNTER — Encounter: Payer: Self-pay | Admitting: Rehabilitation

## 2015-05-01 ENCOUNTER — Ambulatory Visit: Payer: Medicare Other | Admitting: Occupational Therapy

## 2015-05-01 DIAGNOSIS — R531 Weakness: Secondary | ICD-10-CM

## 2015-05-01 DIAGNOSIS — R269 Unspecified abnormalities of gait and mobility: Secondary | ICD-10-CM

## 2015-05-01 DIAGNOSIS — R6889 Other general symptoms and signs: Secondary | ICD-10-CM | POA: Diagnosis not present

## 2015-05-01 DIAGNOSIS — R2681 Unsteadiness on feet: Secondary | ICD-10-CM

## 2015-05-01 DIAGNOSIS — R29898 Other symptoms and signs involving the musculoskeletal system: Secondary | ICD-10-CM

## 2015-05-01 DIAGNOSIS — R279 Unspecified lack of coordination: Secondary | ICD-10-CM

## 2015-05-01 NOTE — Therapy (Signed)
[facility] [address] [place], [place], [address] Phone: [phone]   Fax:  [phone]  Physical Therapy Treatment  Patient Details  Name: Carl [name] MRN: [id number] Date of Birth: 1950-11-21 Referring Provider: Alger Simons, MD  Encounter Date: 05/01/2015      PT End of Session - 05/01/15 1254    Visit Number 13   Number of Visits 17   Date for PT Re-Evaluation 05/07/15   Authorization Type MCR trad primary, BCBS secondary    PT Start Time 1102   PT Stop Time 1145   PT Time Calculation (min) 43 min   Activity Tolerance Patient tolerated treatment well   Behavior During Therapy [facility] for tasks assessed/performed      Past Medical History  Diagnosis Date  . Bradycardia   . Chronic abdominal pain   . Hesitancy of micturition     Past Surgical History  Procedure Laterality Date  . Anterior cervical decomp/discectomy fusion N/A 11/14/2014    Procedure: Cervical Five-Cervical Six  Anterior Cervical Decompression/Diskectomy/Fusion;  Surgeon: Erline Levine, MD;  Location: [facility];  Service: Neurosurgery;  Laterality: N/A;    There were no vitals filed for this visit.  Visit Diagnosis:  Abnormality of gait  Unsteadiness  Left-sided weakness  Generalized weakness  Decreased strength of trunk and back      Subjective Assessment - 05/01/15 1109    Subjective Reports new numbness in L arm and L side (trunk) and down into L leg ("more so than normal")   Limitations Walking;Standing;House hold activities;Sitting;Lifting   Patient Stated Goals "to walk without this walker"   Currently in Pain? Yes   Pain Score 4    Pain Location Arm   Pain Orientation Right;Left   Pain Descriptors / Indicators Aching   Pain Type Chronic pain   Pain Radiating Towards shoulders and hands   Pain Onset More than a month ago   Pain Frequency Constant             NMR:  Continue to address gait without AD  for NMR (postural control, decreased trunk compensation, and improved L hip protraction during stance phase of gait).  Performed 230' at min A level (for facilitation-at trunk for decreased lateral lean in trunk and at L pelvis for increased forward movement, also intermittent tactile cues at chest for posture).  Cues for pt to slow down gait and place emphasis on slower R step in order to increase WB through LLE.  Note that he does well with facilitation, however has increased difficulty with carrying over once facilitation has ended esp with increased fatigue.  Progressed to side stepping with red theraband (had pt face PT with light UE support for increased upright posture) x 20' (each direction) x 2 sets.  Tolerated well with cues for decreased trunk lateral lean during task.    TE:  Supine posterior pelvic tilt x 10 reps with mod fading to S cues for technique (tactile cues for appropriate movement as well) progressing to posterior pelvic tilt while elevating B LEs (still in hooklying) x 10 reps with cues for continued breathing.  Performed B pelvic rotation for lower back stretch as well as increase activation in B transverse abdominals x 10 reps in each direction.  Ended session with supine hip flexor stretch (off EOM) x 2 mins each side with cues on how to set up at home off edge of bed for increased stretch.  PT Education - 05/01/15 1254    Education provided Yes   Education Details Education to contact MD regarding new onset of numbness on L side of body    Person(s) Educated Patient   Methods Explanation   Comprehension Verbalized understanding          PT Short Term Goals - 04/10/15 1455    PT SHORT TERM GOAL #1   Title Pt will initiate HEP for balance and strengthening in order to indicate decreased fall risk and improved functional mobility.  (Target Date: 04/05/15)   Baseline met 04/10/15   Status Achieved   PT SHORT TERM GOAL #2   Title Pt  will increase gait speed to 2.03 ft/ sec in order to indicate decreased fall risk and improved efficiency of gait.     Baseline 1.68 ft/sec on 04/10/15   Status Partially Met   PT SHORT TERM GOAL #3   Title Pt will increase BERG balance test to 42/56 in order to indicate decreased fall risk and improved functional balance.    Baseline 50/56 on 04/10/15   Status Achieved   PT SHORT TERM GOAL #4   Title Pt will ambulate >150' on indoor surfaces w/ SPC at mod I level in order to indicate safe home negotiation.     Baseline 230' with SPC at mod I level on 04/10/15   Status Achieved   PT SHORT TERM GOAL #5   Title Pt will perform flight of stairs w/ single handrail in alternating pattern at mod I level in order to indicate safe negotiation in home.     Baseline met 04/10/15   Status Achieved   PT SHORT TERM GOAL #6   Title Pt will verbalize fall prevention strategies independently in order to better decrease risk of falling at home.     Baseline Pt needs mod cues to recall    Status Partially Met           PT Long Term Goals - 04/17/15 1209    PT LONG TERM GOAL #1   Title Pt will be independent with HEP for strengthening and balance in order to indicate improved functional mobility and decreased balance.  (Target Date: 05/02/14)   PT LONG TERM GOAL #2   Title Pt will increase gait speed to 2.63 ft/sec in order to indicate decreased fall risk and and indicate pt is safe community ambulator.     PT LONG TERM GOAL #3   Title Pt will improve BERG balance test score to 46/56 in order to indicate decreased fall risk and improved functional balance.    Baseline 50/56 04/10/15   Status Achieved   PT LONG TERM GOAL #4   Title Pt will ambulate >500' without AD on paved outdoor surfaces and unlevel surfaces (grass) at mod I level in order to indicate safe community and outdoor home negotiation.     PT LONG TERM GOAL #5   Title Pt will indicate return to leisure activities in order to indicate safe return  to community functions.                 Plan - 05/01/15 1255    Clinical Impression Statement Skilled session continues to focus on gait without AD in order to work on improved postural control, LLE WB and strengthening and decreased trunk compensatory movements.  Also continue to address core strengthening and stabilization during session.     Pt will benefit from skilled therapeutic intervention in order to improve on the  following deficits Abnormal gait;Decreased activity tolerance;Decreased balance;Decreased coordination;Decreased endurance;Decreased mobility;Decreased strength;Impaired perceived functional ability;Impaired sensation;Impaired UE functional use;Improper body mechanics;Postural dysfunction;Pain   Rehab Potential Excellent   PT Frequency 2x / week   PT Duration 8 weeks   PT Treatment/Interventions ADLs/Self Care Home Management;Electrical Stimulation;DME Instruction;Gait training;Stair training;Functional mobility training;Therapeutic exercise;Therapeutic activities;Balance training;Neuromuscular re-education;Patient/family education;Manual techniques   PT Next Visit Plan Continue to assess gait with SPC outdoors. Gait indoors without device for NMR, Gait training with cueing for upright posture, improved quality (needs glute med activation, isolated hip and knee flex. Address selective control in LLE. Exercises for posture strengthening/control, trunk strengthening   Consulted and Agree with Plan of Care Patient        Problem List Patient Active Problem List   Diagnosis Date Noted  . Orthostatic hypotension 11/30/2014  . C6 spinal cord injury ([place]) 11/20/2014  . Neurogenic bowel 11/20/2014  . Neurogenic bladder 11/20/2014  . Fever of unknown origin   . Quadriplegia ([place]) 11/14/2014  . C5 vertebral fracture ([place]) 11/09/2014    Cameron Sprang, PT, MPT [facility] [address] [place], [place], [address] Phone:  [phone]   Fax:  [phone] 05/01/2015, 12:57 PM  Name: Selig Wampole MRN: [id number] Date of Birth: 08-03-1950

## 2015-05-01 NOTE — Therapy (Signed)
[facility] [address] [place], [place], [address] Phone: [phone]   Fax:  [phone]  Occupational Therapy Treatment  Patient Details  Name: Carl [name] MRN: [id number] Date of Birth: 1950-06-07 Referring Provider: Alger Simons  Encounter Date: 05/01/2015      OT End of Session - 05/01/15 1438    Visit Number 14   Number of Visits 17   Date for OT Re-Evaluation 05/02/14   Authorization Type MCR - G code needed   Authorization Time Period renewal completed today for 2x/wk for 4 more weeks beginning week of 05/07/15   Authorization - Visit Number 34   Authorization - Number of Visits 20   OT Start Time 1020   OT Stop Time 1105   OT Time Calculation (min) 45 min   Activity Tolerance Patient tolerated treatment well      Past Medical History  Diagnosis Date  . Bradycardia   . Chronic abdominal pain   . Hesitancy of micturition     Past Surgical History  Procedure Laterality Date  . Anterior cervical decomp/discectomy fusion N/A 11/14/2014    Procedure: Cervical Five-Cervical Six  Anterior Cervical Decompression/Diskectomy/Fusion;  Surgeon: Erline Levine, MD;  Location: [facility];  Service: Neurosurgery;  Laterality: N/A;    There were no vitals filed for this visit.  Visit Diagnosis:  Left-sided weakness - Plan: Ot plan of care cert/re-cert  Generalized weakness - Plan: Ot plan of care cert/re-cert  Decreased strength of trunk and back - Plan: Ot plan of care cert/re-cert  Decreased grip strength of left hand - Plan: Ot plan of care cert/re-cert  Lack of coordination - Plan: Ot plan of care cert/re-cert  Decreased functional activity tolerance - Plan: Ot plan of care cert/re-cert      Subjective Assessment - 05/01/15 1027    Subjective  My arm has been more numb in the last week (Therapist encouraged pt to discuss with MD)    Patient Stated Goals increase strength and decrease tingling    Currently in Pain? Yes   Pain Score 4    Pain Location Arm   Pain Orientation Right;Left   Pain Descriptors / Indicators Aching   Pain Type Chronic pain   Pain Onset More than a month ago   Pain Frequency Constant   Aggravating Factors  worse in the am, cold weather   Pain Relieving Factors activity, pain meds, heat            [facility] OT Assessment - 05/01/15 0001    Coordination   Left 9 Hole Peg Test 41 SEC.    Hand Function   Right Hand Grip (lbs) 52 LBS   Right Hand Lateral Pinch 15 lbs   Right Hand 3 Point Pinch 15 lbs   Left Hand Grip (lbs) 22 LBS   Left Hand Lateral Pinch 8 lbs   Left 3 point pinch 8 lbs                  OT Treatments/Exercises (OP) - 05/01/15 0001    ADLs   Functional Mobility Pt ambulating while carrying plate with Lt hand (cane in Rt hand) with min difficulty. Pt then retrieving cup out of higher cabinet LUE with mod difficulty due to decreased sh. ROM and ability to fully grasp cup Lt hand. Pt then carried cup 1/2 full of H20 with water to table but required assist from Rt hand to place to prevent spills and/or dropping.  ADL Comments Assessed LTG's and discussed POC for renewal period including revised and ongoing goals as well as new goals. Pt continues to demo decreased functional use of Lt hand and higher reaching/ROM LUE.    Neurological Re-education Exercises   Other Exercises 1 Seated with BUE's resting on table in high level flexion with bilateral lateral trunk flexion for lats stretch. Followed by actively engaging trunk in lateral trunk flexion during reaching activities.    Other Exercises 2 Long sitting on mat with BUE's in extension and ER while wt bearing to perform scapula depression and retraction with trunk extension.                   OT Short Term Goals - 04/10/15 1451    OT SHORT TERM GOAL #1   Title Pt will increase LUE gross grip to 30 lbs to help increase overall functional use and independence of L hand -  04/05/2015   Baseline 20lbs    Time 4   Period Weeks   Status Not Met  20 pounds 04/10/2015   OT SHORT TERM GOAL #2   Title Pt will be educated on trunk/core strengthening exercises to increase overall posture and strength in order to safely perform ADLs   Time 4   Period Weeks   Status Achieved   OT SHORT TERM GOAL #3   Title Pt will be educated on desensitization exercises to decrease hypersensitivity to LUE    Time 4   Period Weeks   Status Achieved   OT SHORT TERM GOAL #4   Title Pt will be independent with self correcting poor tecnique and compensatory strategies to left shoulder when functionally using LUE   Time 4   Period Weeks   Status Achieved           OT Long Term Goals - 05/01/15 1439    OT LONG TERM GOAL #1   Title Pt will increase LUE gross grip to 28 lbs to help increase overall functional use and independence of L hand - 06/05/15   Baseline 20lbs at eval. 05/01/15 = 22 lbs    Time 8   Period Weeks   Status Revised  for renewal period   OT LONG TERM GOAL #2   Title Pt will be independent with LB ADLs   Baseline min assist    Time 8   Period Weeks   Status Achieved   OT LONG TERM GOAL #3   Title Pt will improve 9-hole peg test time to less than 35 seconds    Baseline 73 seconds. 05/01/15 = 41 sec.    Time 8   Period Weeks   Status Revised  for renewal period   OT LONG TERM GOAL #4   Title Pt will improve left wrist strength to 4/5 strength    Baseline 2+/5 strength    Time 8   Period Weeks   Status On-going  for renewal    OT LONG TERM GOAL #5   Title Pt will improve lateral and three pinch strength by at least 5lbs   Baseline 7lbs for both lateal and three pinch. 05/01/15 = 8 lbs for both lateral and three pinch   Time 8   Period Weeks   Status On-going   Long Term Additional Goals   Additional Long Term Goals Yes   OT LONG TERM GOAL #6   Title Pt able to consistently retrieve/replace light weight objects from higher shelf LUE and carry cup  and/or plate  without drops/spills Lt hand   Time 4   Period Weeks   Status New               Plan - 05/01/15 1443    Clinical Impression Statement Pt has progressed with coordination and function LUE, but continues to demo decreased functional use of Lt hand to grasp/release, decreased grip and pinch strength, decreased trunk mobility and functional mobility, and decreased ability to perform higher level reaching. Pt would benefit from continued O.T. to address these deficits and maximize function.    Pt will benefit from skilled therapeutic intervention in order to improve on the following deficits (Retired) Decreased coordination;Decreased range of motion;Impaired flexibility;Impaired sensation;Decreased endurance;Improper spinal/pelvic alignment;Decreased activity tolerance;Impaired tone;Decreased balance;Decreased knowledge of use of DME;Impaired UE functional use;Pain;Decreased mobility;Decreased strength   Rehab Potential Good   OT Frequency 2x / week   OT Duration 4 weeks  beginning week of 05/07/15   Plan Continue working on ongoing and revised LTG's as well as new LTG's for renewal period (renewal period 05/07/15 - 06/05/15).    OT Home Exercise Plan coordination and putty HEP, desensitization techniques 03/14/15, theraband HEP 03/16/15   Consulted and Agree with Plan of Care Patient        Problem List Patient Active Problem List   Diagnosis Date Noted  . Orthostatic hypotension 11/30/2014  . C6 spinal cord injury ([place]) 11/20/2014  . Neurogenic bowel 11/20/2014  . Neurogenic bladder 11/20/2014  . Fever of unknown origin   . Quadriplegia ([place]) 11/14/2014  . C5 vertebral fracture ([place]) 11/09/2014    Carey Bullocks, OTR/L 05/01/2015, 2:54 PM  [facility] [address] [place], [place], [address] Phone: [phone]   Fax:  [phone]  Name: Elgar Scoggins MRN: [id number] Date of Birth:  1951/01/09

## 2015-05-03 ENCOUNTER — Encounter: Payer: Self-pay | Admitting: Occupational Therapy

## 2015-05-03 ENCOUNTER — Encounter: Payer: Self-pay | Admitting: Rehabilitation

## 2015-05-03 ENCOUNTER — Ambulatory Visit: Payer: Medicare Other | Admitting: Rehabilitation

## 2015-05-03 DIAGNOSIS — R531 Weakness: Secondary | ICD-10-CM

## 2015-05-03 DIAGNOSIS — R6889 Other general symptoms and signs: Secondary | ICD-10-CM | POA: Diagnosis not present

## 2015-05-03 DIAGNOSIS — R29898 Other symptoms and signs involving the musculoskeletal system: Secondary | ICD-10-CM

## 2015-05-03 DIAGNOSIS — R269 Unspecified abnormalities of gait and mobility: Secondary | ICD-10-CM

## 2015-05-03 DIAGNOSIS — R2681 Unsteadiness on feet: Secondary | ICD-10-CM

## 2015-05-03 NOTE — Therapy (Signed)
[facility] [address] [place], [place], [address] Phone: [phone]   Fax:  [phone]  Physical Therapy Treatment  Patient Details  Name: Carl [name] MRN: [id number] Date of Birth: [birth year] Referring Provider: Alger Simons, MD  Encounter Date: 05/03/2015      PT End of Session - 05/03/15 1323    Visit Number 15   Number of Visits 17   Date for PT Re-Evaluation 05/07/15   Authorization Type MCR trad primary, BCBS secondary    PT Start Time 1316   PT Stop Time 1400   PT Time Calculation (min) 44 min   Activity Tolerance Patient tolerated treatment well   Behavior During Therapy [facility] for tasks assessed/performed      Past Medical History  Diagnosis Date  . Bradycardia   . Chronic abdominal pain   . Hesitancy of micturition     Past Surgical History  Procedure Laterality Date  . Anterior cervical decomp/discectomy fusion N/A 11/14/2014    Procedure: Cervical Five-Cervical Six  Anterior Cervical Decompression/Diskectomy/Fusion;  Surgeon: Erline Levine, MD;  Location: [facility];  Service: Neurosurgery;  Laterality: N/A;    There were no vitals filed for this visit.  Visit Diagnosis:  Abnormality of gait  Unsteadiness  Left-sided weakness  Generalized weakness  Decreased strength of trunk and back      Subjective Assessment - 05/03/15 1322    Subjective "That new numbness has thrown me for a loop, I'm so stiff."    Limitations Walking;Standing;House hold activities;Sitting;Lifting   Patient Stated Goals "to walk without this walker"   Currently in Pain? Yes   Pain Score 5    Pain Location Arm   Pain Orientation Right;Left   Pain Type Chronic pain   Pain Radiating Towards shoulders and hands   Pain Onset More than a month ago   Pain Frequency Constant   Aggravating Factors  worse in the past couple of days    Pain Score 5   Pain Location Back   Pain Orientation Mid;Lower   Pain  Descriptors / Indicators Sore   Pain Type Acute pain           Gait: Assessed gait speed to begin looking at LTG's.   Note that gait speed is currently 1.50 ft/sec, significantly less than goal gait speed, however noted increased stiffness in LLE today vs previous sessions which pt attributes to newer numbness on L side.  States has appt with Dr. Naaman Plummer on Monday to address.  Finished remainder of lap at min A level in order to facilitate upright posture and increased forward weight shift over LLE with cues for increased hip flexion.    NMR: Performed supine hip/knee flexion from mat to/from floor x 2 sets of 10 reps.  Note great activation, however note that with fatigue, pt with increased difficulty maintaining hip add.  Pt states L hip (glutes) feel tight, therefore provided knee to chest stretch x 2 mins.  Tolerated well.  Attempted to have pt lie prone and perform L hip extension with L knee flexion, however due to tightness in abdomen, was unable to complete.  Transitioned to prone on elbows performing small planks x 5 reps of 10 secs each to engage core musculature and increase WB through [place].  Note increased stretch with abdomen, but tolerated well.  Note that L hamstring with increased weakness in standing and functional position, therefore progressed to standing task at counter top moving from hip extension to isolated knee  flex followed by hip flex x 2 sets of 10 reps.  Hip flexion is markedly improved, but note L hamstring still lagging, therefore had pt performing seated "walking" while sitting on stool approx 50' with tactile cues and tapping to L hamstring for increased activation.                        PT Education - 05/03/15 1323    Education provided Yes   Education Details Pt notifying MD of worsening symptoms.   Person(s) Educated Patient   Methods Explanation   Comprehension Verbalized understanding          PT Short Term Goals - 04/10/15 1455    PT  SHORT TERM GOAL #1   Title Pt will initiate HEP for balance and strengthening in order to indicate decreased fall risk and improved functional mobility.  (Target Date: 04/05/15)   Baseline met 04/10/15   Status Achieved   PT SHORT TERM GOAL #2   Title Pt will increase gait speed to 2.03 ft/ sec in order to indicate decreased fall risk and improved efficiency of gait.     Baseline 1.68 ft/sec on 04/10/15   Status Partially Met   PT SHORT TERM GOAL #3   Title Pt will increase BERG balance test to 42/56 in order to indicate decreased fall risk and improved functional balance.    Baseline 50/56 on 04/10/15   Status Achieved   PT SHORT TERM GOAL #4   Title Pt will ambulate >150' on indoor surfaces w/ SPC at mod I level in order to indicate safe home negotiation.     Baseline 230' with SPC at mod I level on 04/10/15   Status Achieved   PT SHORT TERM GOAL #5   Title Pt will perform flight of stairs w/ single handrail in alternating pattern at mod I level in order to indicate safe negotiation in home.     Baseline met 04/10/15   Status Achieved   PT SHORT TERM GOAL #6   Title Pt will verbalize fall prevention strategies independently in order to better decrease risk of falling at home.     Baseline Pt needs mod cues to recall    Status Partially Met           PT Long Term Goals - 05/03/15 1324    PT LONG TERM GOAL #1   Title Pt will be independent with HEP for strengthening and balance in order to indicate improved functional mobility and decreased balance.  (Target Date: 05/02/14)   PT LONG TERM GOAL #2   Title Pt will increase gait speed to 2.63 ft/sec in order to indicate decreased fall risk and and indicate pt is safe community ambulator.     Baseline 1.50 ft/sec with SPC on 05/03/15   PT LONG TERM GOAL #3   Title Pt will improve BERG balance test score to 46/56 in order to indicate decreased fall risk and improved functional balance.    Baseline 50/56 04/10/15   Status Achieved   PT LONG TERM  GOAL #4   Title Pt will ambulate >500' without AD on paved outdoor surfaces and unlevel surfaces (grass) at mod I level in order to indicate safe community and outdoor home negotiation.     PT LONG TERM GOAL #5   Title Pt will indicate return to leisure activities in order to indicate safe return to community functions.  Plan - 05/03/15 1324    Clinical Impression Statement Skilled session addressed single LTG for gait speed, continued trunk control/stabilization, isolated L hip and knee flexion as well as hip extension.  Note pt with increased stiffness today and gait speed of 1.50 ft/sec which is far from goal speed.    Pt will benefit from skilled therapeutic intervention in order to improve on the following deficits Abnormal gait;Decreased activity tolerance;Decreased balance;Decreased coordination;Decreased endurance;Decreased mobility;Decreased strength;Impaired perceived functional ability;Impaired sensation;Impaired UE functional use;Improper body mechanics;Postural dysfunction;Pain   Rehab Potential Excellent   PT Frequency 2x / week   PT Duration 8 weeks   PT Treatment/Interventions ADLs/Self Care Home Management;Electrical Stimulation;DME Instruction;Gait training;Stair training;Functional mobility training;Therapeutic exercise;Therapeutic activities;Balance training;Neuromuscular re-education;Patient/family education;Manual techniques   PT Next Visit Plan begin to look at LTG's for recert (Praneeth Bussey will do). Continue to assess gait with SPC outdoors. Gait indoors without device for NMR, Gait training with cueing for upright posture, improved quality (needs glute med activation, isolated hip and knee flex. Address selective control in LLE. Exercises for posture strengthening/control, trunk strengthening   Consulted and Agree with Plan of Care Patient        Problem List Patient Active Problem List   Diagnosis Date Noted  . Orthostatic hypotension 11/30/2014  .  C6 spinal cord injury ([place]) 11/20/2014  . Neurogenic bowel 11/20/2014  . Neurogenic bladder 11/20/2014  . Fever of unknown origin   . Quadriplegia ([place]) 11/14/2014  . C5 vertebral fracture ([place]) 11/09/2014    Cameron Sprang, PT, MPT [facility] [address] [place], [place], [address] Phone: [phone]   Fax:  [phone] 05/03/2015, 8:31 PM  Name: Isam Unrein MRN: [id number] Date of Birth: 10-Jul-1950

## 2015-05-07 ENCOUNTER — Encounter: Payer: Self-pay | Admitting: Occupational Therapy

## 2015-05-07 ENCOUNTER — Encounter: Payer: Medicare Other | Attending: Physical Medicine & Rehabilitation | Admitting: Physical Medicine & Rehabilitation

## 2015-05-07 ENCOUNTER — Ambulatory Visit: Payer: Medicare Other | Admitting: Physical Therapy

## 2015-05-07 ENCOUNTER — Ambulatory Visit: Payer: Medicare Other | Admitting: Occupational Therapy

## 2015-05-07 ENCOUNTER — Encounter: Payer: Self-pay | Admitting: Physical Medicine & Rehabilitation

## 2015-05-07 VITALS — BP 102/57 | HR 50

## 2015-05-07 DIAGNOSIS — S12400A Unspecified displaced fracture of fifth cervical vertebra, initial encounter for closed fracture: Secondary | ICD-10-CM | POA: Diagnosis not present

## 2015-05-07 DIAGNOSIS — R109 Unspecified abdominal pain: Secondary | ICD-10-CM | POA: Diagnosis not present

## 2015-05-07 DIAGNOSIS — R001 Bradycardia, unspecified: Secondary | ICD-10-CM | POA: Insufficient documentation

## 2015-05-07 DIAGNOSIS — S14106S Unspecified injury at C6 level of cervical spinal cord, sequela: Secondary | ICD-10-CM | POA: Diagnosis present

## 2015-05-07 DIAGNOSIS — M25532 Pain in left wrist: Secondary | ICD-10-CM

## 2015-05-07 DIAGNOSIS — R279 Unspecified lack of coordination: Secondary | ICD-10-CM

## 2015-05-07 DIAGNOSIS — R3911 Hesitancy of micturition: Secondary | ICD-10-CM | POA: Diagnosis not present

## 2015-05-07 DIAGNOSIS — N319 Neuromuscular dysfunction of bladder, unspecified: Secondary | ICD-10-CM

## 2015-05-07 DIAGNOSIS — S12491S Other nondisplaced fracture of fifth cervical vertebra, sequela: Secondary | ICD-10-CM

## 2015-05-07 DIAGNOSIS — Z7901 Long term (current) use of anticoagulants: Secondary | ICD-10-CM | POA: Diagnosis not present

## 2015-05-07 DIAGNOSIS — G8929 Other chronic pain: Secondary | ICD-10-CM | POA: Diagnosis not present

## 2015-05-07 DIAGNOSIS — R29898 Other symptoms and signs involving the musculoskeletal system: Secondary | ICD-10-CM

## 2015-05-07 DIAGNOSIS — G8250 Quadriplegia, unspecified: Secondary | ICD-10-CM | POA: Diagnosis not present

## 2015-05-07 DIAGNOSIS — K592 Neurogenic bowel, not elsewhere classified: Secondary | ICD-10-CM | POA: Diagnosis not present

## 2015-05-07 DIAGNOSIS — R6889 Other general symptoms and signs: Secondary | ICD-10-CM | POA: Diagnosis not present

## 2015-05-07 MED ORDER — MELOXICAM 7.5 MG PO TABS: 7.5000 mg | ORAL_TABLET | Freq: Every day | ORAL | Status: DC

## 2015-05-07 MED ORDER — GABAPENTIN 300 MG PO CAPS: 300.0000 mg | ORAL_CAPSULE | Freq: Three times a day (TID) | ORAL | Status: DC

## 2015-05-07 NOTE — Patient Instructions (Signed)
  PLEASE CALL ME WITH ANY PROBLEMS OR QUESTIONS (#[phone]).      

## 2015-05-07 NOTE — Progress Notes (Signed)
Subjective:    Patient ID: Carl [name], male    DOB: [birth year], 65 y.o.   MRN: [id number]  HPI   Carl [name] is here in follow up of cervical SCI. He states that last week he felt more numbness and pain in the left arm from the axilla (to a lesser extent in the right arm). He feels that it's weaker than it had been also. He feels that his legs are stiffer also. There may be some swelling in his left axilla.  I received a note from OT today who has noted loss of grip strength and more difficulties with balance over this same time frame.   He is taking tramadol for pain, 4x per day. He is also on gabapentin  TID which he says helps---he does feel that it makes him sleepy to a certain extent.       Pain Inventory Average Pain 5 Pain Right Now 4 My pain is sharp, stabbing, tingling and aching  In the last 24 hours, has pain interfered with the following? General activity 5 Relation with others 3 Enjoyment of life 0 What TIME of day is your pain at its worst? morning, night  Sleep (in general) Fair  Pain is worse with: sitting and standing Pain improves with: rest and medication Relief from Meds: 4  Mobility walk with assistance use a cane use a walker how many minutes can you walk? 20 ability to climb steps?  yes do you drive?  no Do you have any goals in this area?  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  no  Physicians involved in your care Any changes since last visit?  no   Family History  Problem Relation Age of Onset  . Heart attack Father   . Alzheimer's disease Mother   . Aneurysm Brother     brain   Social History   Social History  . Marital Status: Married    Spouse Name: N/A  .  Number of Children: N/A  . Years of Education: N/A   Occupational History  . Pastor    Social History Main Topics  . Smoking status: Former Games developer  . Smokeless tobacco: None  . Alcohol Use: None  . Drug Use: None  . Sexual Activity: Not Asked   Other Topics Concern  . None   Social History Narrative   Past Surgical History  Procedure Laterality Date  . Anterior cervical decomp/discectomy fusion N/A 11/14/2014    Procedure: Cervical Five-Cervical Six  Anterior Cervical Decompression/Diskectomy/Fusion;  Surgeon: Maeola Harman, MD;  Location: [facility];  Service: Neurosurgery;  Laterality: N/A;   Past Medical History  Diagnosis Date  . Bradycardia   . Chronic abdominal pain   . Hesitancy of micturition    BP 102/57 mmHg  Pulse 50  SpO2 98%  Opioid Risk Score:   Fall Risk Score:  `1  Depression screen PHQ 2/9  Depression screen PHQ 2/9 01/09/2015  Decreased Interest 0  Down, Depressed, Hopeless 0  PHQ - 2 Score 0  Altered sleeping 0  Tired, decreased energy 1  Change in appetite 1  Feeling bad or failure about yourself  0  Trouble concentrating 0  Moving slowly or fidgety/restless 0  Suicidal thoughts 0  PHQ-9 Score  2     Review of Systems  Gastrointestinal: Positive for abdominal pain.  All other systems reviewed and are negative.      Objective:   Physical Exam  Constitutional: He is oriented to person, place, and time. He appears well-developed and well-nourished.  HENT:  Head: Normocephalic and atraumatic.  Eyes: Conjunctivae are normal. Pupils are equal, round, and reactive to light.  Neck:     Cardiovascular: bradycardia and regular rhythm.  Respiratory: Effort normal. No respiratory distress. He has no wheezes.  GI: Soft. Bowel sounds +.  Genitourinary: foley out Musculoskeletal: He exhibits no edema in either upper ext. Left axilla tender to palpation as was left AC jt. RTC impingement maneuvers equivocal.   Neurological: He is alert and  oriented to person, place, and time. No cranial nerve deficit. Coordination normal.  Patient has normal sensation proximal to C6 dermatome. Reduced PP/LT from C6 through sacral levels Patient has decreased tone in bilateral Lower extremities 4/5 bilateral deltoids, biceps, 4-triceps, 4 wrist extensors, 4/5 right HI, 3-/5 left HI, right HF, KE and ADF/APF 3+ to 4-/5. 3 to 3+/5 left hip flexors, 4/5 knee extensors 3+/4- ankle dorsiflexors and plantar flexors, left weaker than right. Mild steppage gait LLE persists as he walks with a cane. DTR's 2+, ?1+ tricep on left.  .  Skin: Skin is warm and dry.  Nail beds ?yellow  Psych: still quiet but alert    Assessment/Plan:  1. Functional deficits secondary to C6 ASIA C incomplete tetraplegia, spinal cord injury due to fall with C5-C6 fracture and neurogenic bowel and bladder  -continue outpt therapies and neuro rehab  -will order MRI of cervical spine to rule out syringx or other spine/disc changes which could be affecting cord or nerve root, C7 level?   2. For spasms: continue baclofen but at half dose ( ) at bedtime. HEP  3. Pain Management: gabapentin  increase to  TID. -add scheduled mobic 7.5mg  daily  - tramadol prn.  -needs [facility] for LUE  4. Mood: improved  5. Neurogenic bladder---I/O caths-  -continue to keep volumes 300-500cc  -urology follow up as scheduled of UDS  6. Neurogenic bowel: senna in pm and fleet enema qam--more spontaneous now.  7. BP: dc'ed florinef  Follow up in 1 month pending MRI.    Thirty minutes of face to face patient care time were spent during this visit. All questions were encouraged and answered.

## 2015-05-07 NOTE — Therapy (Signed)
[facility] [address] [place], [place], [address] Phone: [phone]   Fax:  [phone]  Occupational Therapy Treatment  Patient Details  Name: Carl [name] MRN: [id number] Date of Birth: 1950/12/07 Referring Provider: Alger Simons  Encounter Date: 05/07/2015      OT End of Session - 05/07/15 1211    Visit Number 15   Number of Visits 23   Date for OT Re-Evaluation 06/01/15   Authorization Type MCR - G code needed   Authorization Time Period renewal completed today for 2x/wk for 4 more weeks beginning week of 05/07/15   Authorization - Visit Number 15   Authorization - Number of Visits 20   OT Start Time 6962   OT Stop Time 9528  session terminated due to change in medical status   OT Time Calculation (min) 29 min   Activity Tolerance Treatment limited secondary to medical complications (Comment)      Past Medical History  Diagnosis Date  . Bradycardia   . Chronic abdominal pain   . Hesitancy of micturition     Past Surgical History  Procedure Laterality Date  . Anterior cervical decomp/discectomy fusion N/A 11/14/2014    Procedure: Cervical Five-Cervical Six  Anterior Cervical Decompression/Diskectomy/Fusion;  Surgeon: Erline Levine, MD;  Location: [facility];  Service: Neurosurgery;  Laterality: N/A;    There were no vitals filed for this visit.  Visit Diagnosis:  Decreased grip strength of left hand  Lack of coordination  Decreased pinch strength  Pain in left wrist      Subjective Assessment - 05/07/15 1025    Subjective  The stiffness, numbness and pain are getting worse. I see the dr today   Patient is accompained by: Family member   Patient Stated Goals increase strength and decrease tingling   Currently in Pain? Yes   Pain Score 4   3 in r shoulder   Pain Location Shoulder  starts in L lats and radiates up to shoulder   Pain Orientation Left;Right   Pain Descriptors / Indicators  Stabbing;Throbbing   Pain Type Acute pain   Pain Onset In the past 7 days   Pain Frequency Constant   Aggravating Factors  inactivity - worse at night when I am sleeping   Pain Relieving Factors activity, pain meds, heat   Multiple Pain Sites No  increased numbness in BLE's    Pain Radiating Towards pt repots increaed numbness back of knees radiating down calfs and into feet over past week. Pt with signficant stiffness in LE"s and UE"s and demonstrating change in gait pattern                                OT Short Term Goals - 05/07/15 1206    OT SHORT TERM GOAL #1   Title Pt will increase LUE gross grip to 30 lbs to help increase overall functional use and independence of L hand - 04/05/2015   Baseline 20lbs    Time 4   Period Weeks   Status Not Met  20 pounds 04/10/2015   OT SHORT TERM GOAL #2   Title Pt will be educated on trunk/core strengthening exercises to increase overall posture and strength in order to safely perform ADLs   Time 4   Period Weeks   Status Achieved   OT SHORT TERM GOAL #3   Title Pt will be educated on desensitization exercises to decrease  hypersensitivity to LUE    Time 4   Period Weeks   Status Achieved   OT SHORT TERM GOAL #4   Title Pt will be independent with self correcting poor tecnique and compensatory strategies to left shoulder when functionally using LUE   Time 4   Period Weeks   Status Achieved           OT Long Term Goals - 05/07/15 1206    OT LONG TERM GOAL #1   Title Pt will increase LUE gross grip to 28 lbs to help increase overall functional use and independence of L hand - 06/05/15   Baseline 20lbs at eval. 05/01/15 = 22 lbs    Time 8   Period Weeks   Status Revised  for renewal period   OT LONG TERM GOAL #2   Title Pt will be independent with LB ADLs   Baseline min assist    Time 8   Period Weeks   Status Achieved   OT LONG TERM GOAL #3   Title Pt will improve 9-hole peg test time to less than 35  seconds    Baseline 73 seconds. 05/01/15 = 41 sec.    Time 8   Period Weeks   Status Revised  for renewal period   OT LONG TERM GOAL #4   Title Pt will improve left wrist strength to 4/5 strength    Baseline 2+/5 strength    Time 8   Period Weeks   Status On-going  for renewal    OT LONG TERM GOAL #5   Title Pt will improve lateral and three pinch strength by at least 5lbs   Baseline 7lbs for both lateal and three pinch. 05/01/15 = 8 lbs for both lateral and three pinch   Time 8   Period Weeks   Status On-going   OT LONG TERM GOAL #6   Title Pt able to consistently retrieve/replace light weight objects from higher shelf LUE and carry cup and/or plate without drops/spills Lt hand   Time 4   Period Weeks   Status New               Plan - 05/07/15 1206    Clinical Impression Statement Pt arrived today with worsening symptoms (pain in both shoulders, decreased grip strength and coordination in L hand, stiffnes and increased numbness of BLE's, change in gait)  See note for details. Objective tests done and results fowrarded to MD.  Session terminated and await direciton from MD Pt in agreement.    Pt will benefit from skilled therapeutic intervention in order to improve on the following deficits (Retired) Decreased coordination;Decreased range of motion;Impaired flexibility;Impaired sensation;Decreased endurance;Improper spinal/pelvic alignment;Decreased activity tolerance;Impaired tone;Decreased balance;Decreased knowledge of use of DME;Impaired UE functional use;Pain;Decreased mobility;Decreased strength   Rehab Potential Good   Clinical Impairments Affecting Rehab Potential None known at this time   OT Frequency 2x / week   OT Duration 4 weeks   OT Treatment/Interventions Self-care/ADL training;Electrical Stimulation;Moist Heat;Traction;Therapeutic exercise;Neuromuscular education;Energy conservation;Functional Mobility Training;Passive range of motion;Therapeutic  exercises;Therapeutic activities;Patient/family education;Balance training;Fluidtherapy   Plan check status!, if possible address grip strengh, coordination and functional use of L hand   OT Home Exercise Plan coordination and putty HEP, desensitization techniques 03/14/15, theraband HEP 03/16/15   Consulted and Agree with Plan of Care Patient        Problem List Patient Active Problem List   Diagnosis Date Noted  . Orthostatic hypotension 11/30/2014  . C6 spinal cord injury ([place])  11/20/2014  . Neurogenic bowel 11/20/2014  . Neurogenic bladder 11/20/2014  . Fever of unknown origin   . Quadriplegia ([place]) 11/14/2014  . C5 vertebral fracture ([place]) 11/09/2014   Treatment note: Pt arrived today with significant decline in functional mobility and balance. Pt reported that he has been experiencing increasing stiffness in all 4 extremities (left greater than right) over past week, increased numbness in LE's, decreased strength and coordination in L hand, increase pain in both shoulders (L greater than R).  Objective findings:  Decreased grip strength from 22 pounds to 15 pounds, decreased coordination of L hand (increased time on 9 hole peg test from 41 seconds to 53.43), decreased functional use of L hand, change in balance, change in functional mobility.  After reassessment, session terminated and information forwarded to MD. Pt to see MD today.  Pt in agreement with plan.  Quay Burow, OTR/L 05/07/2015, 12:16 PM  [facility] [address] [place], [place], [address] Phone: [phone]   Fax:  [phone]  Name: Mattias Walmsley MRN: [id number] Date of Birth: 1951/01/23

## 2015-05-09 ENCOUNTER — Ambulatory Visit: Payer: Medicare Other | Attending: Physical Medicine & Rehabilitation | Admitting: Physical Therapy

## 2015-05-09 ENCOUNTER — Encounter: Payer: Self-pay | Admitting: Physical Therapy

## 2015-05-09 ENCOUNTER — Ambulatory Visit: Payer: Medicare Other | Admitting: Occupational Therapy

## 2015-05-09 DIAGNOSIS — R29898 Other symptoms and signs involving the musculoskeletal system: Secondary | ICD-10-CM | POA: Insufficient documentation

## 2015-05-09 DIAGNOSIS — M6289 Other specified disorders of muscle: Secondary | ICD-10-CM | POA: Diagnosis present

## 2015-05-09 DIAGNOSIS — R279 Unspecified lack of coordination: Secondary | ICD-10-CM | POA: Insufficient documentation

## 2015-05-09 DIAGNOSIS — R2681 Unsteadiness on feet: Secondary | ICD-10-CM | POA: Diagnosis present

## 2015-05-09 DIAGNOSIS — R531 Weakness: Secondary | ICD-10-CM | POA: Insufficient documentation

## 2015-05-09 DIAGNOSIS — M6281 Muscle weakness (generalized): Secondary | ICD-10-CM | POA: Diagnosis present

## 2015-05-09 DIAGNOSIS — R6889 Other general symptoms and signs: Secondary | ICD-10-CM | POA: Diagnosis present

## 2015-05-09 DIAGNOSIS — M25532 Pain in left wrist: Secondary | ICD-10-CM | POA: Diagnosis present

## 2015-05-09 DIAGNOSIS — R269 Unspecified abnormalities of gait and mobility: Secondary | ICD-10-CM | POA: Insufficient documentation

## 2015-05-09 NOTE — Therapy (Signed)
[facility] [address] [place], [place], [address] Phone: [phone]   Fax:  [phone]  Occupational Therapy Treatment  Patient Details  Name: Carl [name] MRN: [id number] Date of Birth: [birth year] Referring Provider: Alger Simons  Encounter Date: 05/09/2015      OT End of Session - 05/09/15 1410    Visit Number 16   Number of Visits 23   Date for OT Re-Evaluation 06/01/15   Authorization Type MCR - G code needed   Authorization Time Period week 1/4 (renewal period beginning 05/07/15)   Authorization - Visit Number 16   Authorization - Number of Visits 20   OT Start Time 1700   OT Stop Time 1232   OT Time Calculation (min) 44 min   Activity Tolerance Patient tolerated treatment well      Past Medical History  Diagnosis Date  . Bradycardia   . Chronic abdominal pain   . Hesitancy of micturition     Past Surgical History  Procedure Laterality Date  . Anterior cervical decomp/discectomy fusion N/A 11/14/2014    Procedure: Cervical Five-Cervical Six  Anterior Cervical Decompression/Diskectomy/Fusion;  Surgeon: Erline Levine, MD;  Location: [facility];  Service: Neurosurgery;  Laterality: N/A;    There were no vitals filed for this visit.  Visit Diagnosis:  Decreased grip strength of left hand  Lack of coordination      Subjective Assessment - 05/09/15 1150    Subjective  Dr. Naaman Plummer ordered an MRI but they haven't scheduled it yet. He said it was ok to continue therapy    Patient Stated Goals increase strength and decrease tingling   Currently in Pain? Yes   Pain Score 4    Pain Location Arm  and hands (Lt worse than Rt   Pain Orientation Right;Left   Pain Descriptors / Indicators Throbbing;Tingling;Sharp   Pain Type Acute pain   Pain Radiating Towards shoulders more throbbing, hands more tingling   Pain Onset 1 to 4 weeks ago   Pain Frequency Constant   Aggravating Factors  worse in the am   Pain Relieving Factors pain meds, activity            [facility] OT Assessment - 05/09/15 0001    Observation/Other Assessments   Observations Pt with increased grip strength Rt hand since eval, Pt with improved coordination in both hands, and Lt grip strength back up to 22 lbs today.    Coordination   Right 9 Hole Peg Test 23.75 sec   Left 9 Hole Peg Test 33.56 sec.    Hand Function   Right Hand Grip (lbs) 60 lbs   Left Hand Grip (lbs) 22 LBS                  OT Treatments/Exercises (OP) - 05/09/15 1406    ADLs   ADL Comments Re-assessed hand function bilaterally (see assessment). Pt instructed to avoid heavy overhead lifting or BUE wt bearing (other than pushing off chair to stand) until MRI results are back    Hand Exercises   Other Hand Exercises Gripper set at 25 lbs resistance to pick up blocks for sustained grip strength Lt hand with min difficulty.    Other Hand Exercises Pt practiced grasping and releasing larger diameter object in Lt hand to pick up and replace with mod difficulty. Pt unable to get hand fully around object. Pt instructed to work on thumb palmer abduction in AA/ROM (around 2" diameter object)  OT Short Term Goals - 05/07/15 1206    OT SHORT TERM GOAL #1   Title Pt will increase LUE gross grip to 30 lbs to help increase overall functional use and independence of L hand - 04/05/2015   Baseline 20lbs    Time 4   Period Weeks   Status Not Met  20 pounds 04/10/2015   OT SHORT TERM GOAL #2   Title Pt will be educated on trunk/core strengthening exercises to increase overall posture and strength in order to safely perform ADLs   Time 4   Period Weeks   Status Achieved   OT SHORT TERM GOAL #3   Title Pt will be educated on desensitization exercises to decrease hypersensitivity to LUE    Time 4   Period Weeks   Status Achieved   OT SHORT TERM GOAL #4   Title Pt will be independent with self correcting poor tecnique and  compensatory strategies to left shoulder when functionally using LUE   Time 4   Period Weeks   Status Achieved           OT Long Term Goals - 05/07/15 1206    OT LONG TERM GOAL #1   Title Pt will increase LUE gross grip to 28 lbs to help increase overall functional use and independence of L hand - 06/05/15   Baseline 20lbs at eval. 05/01/15 = 22 lbs    Time 8   Period Weeks   Status Revised  for renewal period   OT LONG TERM GOAL #2   Title Pt will be independent with LB ADLs   Baseline min assist    Time 8   Period Weeks   Status Achieved   OT LONG TERM GOAL #3   Title Pt will improve 9-hole peg test time to less than 35 seconds    Baseline 73 seconds. 05/01/15 = 41 sec.    Time 8   Period Weeks   Status Revised  for renewal period   OT LONG TERM GOAL #4   Title Pt will improve left wrist strength to 4/5 strength    Baseline 2+/5 strength    Time 8   Period Weeks   Status On-going  for renewal    OT LONG TERM GOAL #5   Title Pt will improve lateral and three pinch strength by at least 5lbs   Baseline 7lbs for both lateal and three pinch. 05/01/15 = 8 lbs for both lateral and three pinch   Time 8   Period Weeks   Status On-going   OT LONG TERM GOAL #6   Title Pt able to consistently retrieve/replace light weight objects from higher shelf LUE and carry cup and/or plate without drops/spills Lt hand   Time 4   Period Weeks   Status New               Plan - 05/09/15 1411    Clinical Impression Statement Pt appears to have improved Rt hand strength and coordination since evaluation. Pt also has improved coordination in Lt hand as well as back to grip strength from last week. Pt still reports increased numbness and pain BUE's and increased numbness bilateral LE's.    Plan continue coordination, strength Lt hand, postural control    OT Home Exercise Plan coordination and putty HEP, desensitization techniques 03/14/15, theraband HEP 03/16/15   Consulted and Agree with  Plan of Care Patient        Problem List Patient Active Problem List  Diagnosis Date Noted  . Orthostatic hypotension 11/30/2014  . C6 spinal cord injury ([place]) 11/20/2014  . Neurogenic bowel 11/20/2014  . Neurogenic bladder 11/20/2014  . Fever of unknown origin   . Quadriplegia ([place]) 11/14/2014  . C5 vertebral fracture ([place]) 11/09/2014    Carey Bullocks, OTR/L 05/09/2015, 2:16 PM  [facility] [address] [place], [place], [address] Phone: [phone]   Fax:  [phone]  Name: Quamel Fitzmaurice MRN: [id number] Date of Birth: [birth year]

## 2015-05-10 NOTE — Therapy (Signed)
[facility] [address] [place], [place], [address] Phone: [phone]   Fax:  [phone]  Physical Therapy Treatment  Patient Details  Name: Carl [name] MRN: [id number] Date of Birth: 06/14/1950 Referring Provider: Alger Simons, MD  Encounter Date: 05/09/2015      PT End of Session - 05/09/15 1109    Number of Visits 17   Date for PT Re-Evaluation 05/07/15   Authorization Type MCR trad primary, BCBS secondary    PT Start Time 1103   PT Stop Time 1145   PT Time Calculation (min) 42 min   Activity Tolerance Patient tolerated treatment well   Behavior During Therapy [facility] for tasks assessed/performed      Past Medical History  Diagnosis Date  . Bradycardia   . Chronic abdominal pain   . Hesitancy of micturition     Past Surgical History  Procedure Laterality Date  . Anterior cervical decomp/discectomy fusion N/A 11/14/2014    Procedure: Cervical Five-Cervical Six  Anterior Cervical Decompression/Diskectomy/Fusion;  Surgeon: Erline Levine, MD;  Location: [facility];  Service: Neurosurgery;  Laterality: N/A;    There were no vitals filed for this visit.  Visit Diagnosis:  Decreased functional activity tolerance  Decreased strength of trunk and back  Generalized weakness  Left-sided weakness  Unsteadiness  Abnormality of gait      Subjective Assessment - 05/09/15 1106    Subjective No new complaints.   Patient is accompained by: Family member   Limitations Walking;Standing;House hold activities;Sitting;Lifting   Patient Stated Goals "to walk without this walker"   Currently in Pain? Yes   Pain Score 4    Pain Location Generalized  arms and legs some   Pain Orientation Right;Left   Pain Descriptors / Indicators Tingling;Sharp;Throbbing;Other (Comment)  piercing   Pain Onset 1 to 4 weeks ago   Pain Frequency Constant   Aggravating Factors  inactivit- worse in am after waking up   Pain  Relieving Factors pain meds, head, activity           [facility] Adult PT Treatment/Exercise - 05/09/15 1111    Transfers   Sit to Stand 6: Modified independent (Device/Increase time);With upper extremity assist;With armrests;From bed;From chair/3-in-1   Stand to Sit 6: Modified independent (Device/Increase time);Not tested (comment);To bed;To chair/3-in-1   Ambulation/Gait   Ambulation/Gait Yes   Ambulation/Gait Assistance 4: Min guard;4: Min assist   Ambulation Distance (Feet) 550 Feet   Assistive device Straight cane   Gait Pattern Step-through pattern;Decreased stance time - left;Decreased step length - right;Decreased dorsiflexion - left;Antalgic;Left flexed knee in stance;Narrow base of support;Poor foot clearance - left  occasional scuffing of left foot noted with gait   Ambulation Surface Level;Unlevel;Indoor;Outdoor;Paved;Gravel;Grass   Gait velocity 24.25 sec's= 1.35 ft/sec   Berg Balance Test   Sit to Stand Able to stand without using hands and stabilize independently   Standing Unsupported Able to stand safely 2 minutes   Sitting with Back Unsupported but Feet Supported on Floor or Stool Able to sit safely and securely 2 minutes   Stand to Sit Sits safely with minimal use of hands   Transfers Able to transfer safely, minor use of hands   Standing Unsupported with Eyes Closed Able to stand 10 seconds safely   Standing Ubsupported with Feet Together Able to place feet together independently and stand 1 minute safely   From Standing, Reach Forward with Outstretched Arm Can reach forward >12 cm safely (5")  7 inches   From  Standing Position, Pick up Object from Floor Able to pick up shoe, needs supervision   From Standing Position, Turn to Look Behind Over each Shoulder Looks behind one side only/other side shows less weight shift  right > left   Turn 360 Degrees Able to turn 360 degrees safely but slowly  > 6 seconds each way   Standing Unsupported, Alternately Place Feet on  Step/Stool Able to complete 4 steps without aid or supervision   Standing Unsupported, One Foot in Front Able to plae foot ahead of the other independently and hold 30 seconds   Standing on One Leg Able to lift leg independently and hold > 10 seconds   Total Score 48   Dynamic Gait Index   Level Surface Mild Impairment   Change in Gait Speed Mild Impairment   Gait with Horizontal Head Turns Mild Impairment   Gait with Vertical Head Turns Normal   Gait and Pivot Turn Mild Impairment   Step Over Obstacle Moderate Impairment   Step Around Obstacles Mild Impairment   Steps Moderate Impairment   Total Score 15   Timed Up and Go Test   TUG Normal TUG   Normal TUG (seconds) 19.25  with straight cane            PT Short Term Goals - 04/10/15 1455    PT SHORT TERM GOAL #1   Title Pt will initiate HEP for balance and strengthening in order to indicate decreased fall risk and improved functional mobility.  (Target Date: 04/05/15)   Baseline met 04/10/15   Status Achieved   PT SHORT TERM GOAL #2   Title Pt will increase gait speed to 2.03 ft/ sec in order to indicate decreased fall risk and improved efficiency of gait.     Baseline 1.68 ft/sec on 04/10/15   Status Partially Met   PT SHORT TERM GOAL #3   Title Pt will increase BERG balance test to 42/56 in order to indicate decreased fall risk and improved functional balance.    Baseline 50/56 on 04/10/15   Status Achieved   PT SHORT TERM GOAL #4   Title Pt will ambulate >150' on indoor surfaces w/ SPC at mod I level in order to indicate safe home negotiation.     Baseline 230' with SPC at mod I level on 04/10/15   Status Achieved   PT SHORT TERM GOAL #5   Title Pt will perform flight of stairs w/ single handrail in alternating pattern at mod I level in order to indicate safe negotiation in home.     Baseline met 04/10/15   Status Achieved   PT SHORT TERM GOAL #6   Title Pt will verbalize fall prevention strategies independently in order to  better decrease risk of falling at home.     Baseline Pt needs mod cues to recall    Status Partially Met           PT Long Term Goals - 05/09/15 1110    PT LONG TERM GOAL #1   Title Pt will be independent with HEP for strengthening and balance in order to indicate improved functional mobility and decreased balance.  (Target Date: 05/02/14)   Baseline 05/09/15- met with program issued to date   PT [place] #2   Title Pt will increase gait speed to 2.63 ft/sec in order to indicate decreased fall risk and and indicate pt is safe community ambulator.     Baseline 05/09/15: 1.35 ft/sec with straight cane; (  1.50 ft/sec with straight cane on 05/03/15)   Status Not Met   PT LONG TERM GOAL #3   Title Pt will improve BERG balance test score to 46/56 in order to indicate decreased fall risk and improved functional balance.    Baseline 05/09/15: scored 48/56 today; (50/56 04/10/15)   Status Achieved   PT LONG TERM GOAL #4   Title Pt will ambulate >500' without AD on paved outdoor surfaces and unlevel surfaces (grass) at mod I level in order to indicate safe community and outdoor home negotiation.     Baseline 05/09/15: requires supervision to min/min gaurd assist on complaint outdoor surfaces at times   Status Not Met   PT LONG TERM GOAL #5   Title Pt will indicate return to leisure activities in order to indicate safe return to community functions.     Baseline 05/09/15: has not returned to these activities   Status Not Met            Plan - 05/09/15 1109    Clinical Impression Statement Pt was making good progress toward goals untill the past week or so, now has had a decline in function. Pt has seen his MD and medication changes were made at that time. LTGs checked today for PT to complete renewal.    Pt will benefit from skilled therapeutic intervention in order to improve on the following deficits Abnormal gait;Decreased activity tolerance;Decreased balance;Decreased coordination;Decreased  endurance;Decreased mobility;Decreased strength;Impaired perceived functional ability;Impaired sensation;Impaired UE functional use;Improper body mechanics;Postural dysfunction;Pain   Rehab Potential Excellent   PT Frequency 2x / week   PT Duration 8 weeks   PT Treatment/Interventions ADLs/Self Care Home Management;Electrical Stimulation;DME Instruction;Gait training;Stair training;Functional mobility training;Therapeutic exercise;Therapeutic activities;Balance training;Neuromuscular re-education;Patient/family education;Manual techniques   PT Next Visit Plan Continue to assess gait with SPC outdoors. Gait indoors without device for NMR, Gait training with cueing for upright posture, improved quality (needs glute med activation, isolated hip and knee flex. Address selective control in LLE. Exercises for posture strengthening/control, trunk strengthening, all of this working toward goals of PT renewal.                           Consulted and Agree with Plan of Care Patient        Problem List Patient Active Problem List   Diagnosis Date Noted  . Orthostatic hypotension 11/30/2014  . C6 spinal cord injury ([place]) 11/20/2014  . Neurogenic bowel 11/20/2014  . Neurogenic bladder 11/20/2014  . Fever of unknown origin   . Quadriplegia ([place]) 11/14/2014  . C5 vertebral fracture [facility]) 11/09/2014    Willow Ora 05/10/2015, 9:28 AM  Willow Ora, PTA, [facility] [address] [place], [place] [address] [phone] 05/10/2015, 9:28 AM   Name: Roni Scow MRN: [id number] Date of Birth: 05/24/1950

## 2015-05-10 NOTE — Addendum Note (Signed)
Addended by: Harriet Butte A on: 05/10/2015 01:16 PM   Modules accepted: Orders

## 2015-05-14 ENCOUNTER — Encounter: Payer: Self-pay | Admitting: Occupational Therapy

## 2015-05-14 ENCOUNTER — Ambulatory Visit: Payer: Medicare Other | Admitting: Occupational Therapy

## 2015-05-14 ENCOUNTER — Ambulatory Visit: Payer: Medicare Other | Admitting: Physical Therapy

## 2015-05-14 ENCOUNTER — Encounter: Payer: Self-pay | Admitting: Physical Therapy

## 2015-05-14 DIAGNOSIS — R2681 Unsteadiness on feet: Secondary | ICD-10-CM

## 2015-05-14 DIAGNOSIS — M25532 Pain in left wrist: Secondary | ICD-10-CM

## 2015-05-14 DIAGNOSIS — R279 Unspecified lack of coordination: Secondary | ICD-10-CM

## 2015-05-14 DIAGNOSIS — R6889 Other general symptoms and signs: Secondary | ICD-10-CM

## 2015-05-14 DIAGNOSIS — R269 Unspecified abnormalities of gait and mobility: Secondary | ICD-10-CM

## 2015-05-14 DIAGNOSIS — R29898 Other symptoms and signs involving the musculoskeletal system: Secondary | ICD-10-CM

## 2015-05-14 DIAGNOSIS — R531 Weakness: Secondary | ICD-10-CM

## 2015-05-14 NOTE — Therapy (Signed)
[facility] [address] [place], [place], [address] Phone: [phone]   Fax:  [phone]  Occupational Therapy Treatment  Patient Details  Name: Carl [name] MRN: [id number] Date of Birth: 05/09/1950 Referring Provider: Zachary Swartz  Encounter Date: 05/14/2015      OT End of Session - 05/14/15 1308    Visit Number 17   Number of Visits 23   Date for OT Re-Evaluation 06/01/15   Authorization Type MCR - G code needed   Authorization Time Period week 1/4 (renewal period beginning 05/07/15)   Authorization - Visit Number 17   Authorization - Number of Visits 20   OT Start Time 1101   OT Stop Time 1145   OT Time Calculation (min) 44 min   Activity Tolerance Patient tolerated treatment well      Past Medical History  Diagnosis Date  . Bradycardia   . Chronic abdominal pain   . Hesitancy of micturition     Past Surgical History  Procedure Laterality Date  . Anterior cervical decomp/discectomy fusion N/A 11/14/2014    Procedure: Cervical Five-Cervical Six  Anterior Cervical Decompression/Diskectomy/Fusion;  Surgeon: Joseph Stern, MD;  Location: [facility];  Service: Neurosurgery;  Laterality: N/A;    There were no vitals filed for this visit.  Visit Diagnosis:  Decreased grip strength of left hand  Lack of coordination  Decreased functional activity tolerance  Pain in left wrist      Subjective Assessment - 05/14/15 1108    Subjective  I have my MRI on Friday   Pertinent History see epic   Patient Stated Goals increase strength and decrease tingling   Currently in Pain? Yes   Pain Score 3   L = 3, R = 2   Pain Location Arm   Pain Orientation Right;Left   Pain Descriptors / Indicators Throbbing;Sharp;Tingling   Pain Type Acute pain   Pain Onset 1 to 4 weeks ago   Pain Frequency Constant   Aggravating Factors  worse in the am when I first get up, PT session helped a little   Pain Relieving  Factors pain meds, activity                      OT Treatments/Exercises (OP) - 05/14/15 0001    Exercises   Exercises Wrist;Hand   Weighted Stretch Over Towel Roll   Wrist Flexion Weighted Stretch Limitations Using 2 pound weight, 12 reps x2   Wrist Extension Weighted Stretch Limitations Using 2 pounds weight, 12 reps x2   Wrist Exercises   Other wrist exercises Wrist flexiona and extension using 2 pound to activate into plane against gravity.  Pt initally with wirst pain, especially with flexion however after manual therapy pt with improved painfree range.    Hand Exercises   Other Hand Exercises Addressed increasng AROM following manual therapy for composite flexion and extension   Modalities   Modalities Moist Heat   Moist Heat Therapy   Number Minutes Moist Heat 10 Minutes  while treating L hand/wrist   Moist Heat Location Shoulder   Manual Therapy   Manual Therapy Joint mobilization;Soft tissue mobilization   Manual therapy comments Pt with signficant tightness in L wrist and hand; joint mob and soft tissue mob to decrease stiffness and increase A/PROM  Pt does not have full composite flexion or extension in L hand.  This impacts functional strength in L hand.                     OT Short Term Goals - 05/14/15 1306    OT SHORT TERM GOAL #1   Title Pt will increase LUE gross grip to 30 lbs to help increase overall functional use and independence of L hand - 04/05/2015   Baseline 20lbs    Time 4   Period Weeks   Status Not Met  20 pounds 04/10/2015   OT SHORT TERM GOAL #2   Title Pt will be educated on trunk/core strengthening exercises to increase overall posture and strength in order to safely perform ADLs   Time 4   Period Weeks   Status Achieved   OT SHORT TERM GOAL #3   Title Pt will be educated on desensitization exercises to decrease hypersensitivity to LUE    Time 4   Period Weeks   Status Achieved   OT SHORT TERM GOAL #4   Title Pt will be  independent with self correcting poor tecnique and compensatory strategies to left shoulder when functionally using LUE   Time 4   Period Weeks   Status Achieved           OT Long Term Goals - 05/14/15 1306    OT LONG TERM GOAL #1   Title Pt will increase LUE gross grip to 28 lbs to help increase overall functional use and independence of L hand - 06/05/15   Baseline 20lbs at eval. 05/01/15 = 22 lbs    Time 8   Period Weeks   Status Revised  for renewal period   OT LONG TERM GOAL #2   Title Pt will be independent with LB ADLs   Baseline min assist    Time 8   Period Weeks   Status Achieved   OT LONG TERM GOAL #3   Title Pt will improve 9-hole peg test time to less than 35 seconds    Baseline 73 seconds. 05/01/15 = 41 sec.    Time 8   Period Weeks   Status Revised  for renewal period   OT LONG TERM GOAL #4   Title Pt will improve left wrist strength to 4/5 strength    Baseline 2+/5 strength    Time 8   Period Weeks   Status On-going  for renewal    OT LONG TERM GOAL #5   Title Pt will improve lateral and three pinch strength by at least 5lbs   Baseline 7lbs for both lateal and three pinch. 05/01/15 = 8 lbs for both lateral and three pinch   Time 8   Period Weeks   Status On-going   OT LONG TERM GOAL #6   Title Pt able to consistently retrieve/replace light weight objects from higher shelf LUE and carry cup and/or plate without drops/spills Lt hand   Time 4   Period Weeks   Status New               Plan - 05/14/15 1306    Clinical Impression Statement Pt progressing slowly toward goals. Pt remains with pain in B shoulders however responds well to heat temporarily. Pt cleared to return to therapy however does have MRI scheduled for this Friday. Will avoid overhead or UE resistive actvities until after MRI.    Pt will benefit from skilled therapeutic intervention in order to improve on the following deficits (Retired) Decreased coordination;Decreased range of  motion;Impaired flexibility;Impaired sensation;Decreased endurance;Improper spinal/pelvic alignment;Decreased activity tolerance;Impaired tone;Decreased balance;Decreased knowledge of use of DME;Impaired UE functional use;Pain;Decreased mobility;Decreased strength   Rehab Potential Good   Clinical Impairments  Affecting Rehab Potential None known at this time   OT Frequency 2x / week   OT Duration 4 weeks   OT Treatment/Interventions Self-care/ADL training;Electrical Stimulation;Moist Heat;Traction;Therapeutic exercise;Neuromuscular education;Energy conservation;Functional Mobility Training;Passive range of motion;Therapeutic exercises;Therapeutic activities;Patient/family education;Balance training;Fluidtherapy   Plan monitor pain, ROM and strength in Lt hand, balance,   OT Home Exercise Plan coordination and putty HEP, desensitization techniques 03/14/15, theraband HEP 03/16/15   Consulted and Agree with Plan of Care Patient        Problem List Patient Active Problem List   Diagnosis Date Noted  . Orthostatic hypotension 11/30/2014  . C6 spinal cord injury ([place]) 11/20/2014  . Neurogenic bowel 11/20/2014  . Neurogenic bladder 11/20/2014  . Fever of unknown origin   . Quadriplegia ([place]) 11/14/2014  . C5 vertebral fracture ([place]) 11/09/2014    Quay Burow, OTR/L 05/14/2015, 1:12 PM  [facility] [address] [place], [place], [address] Phone: [phone]   Fax:  [phone]  Name: Carl [name] MRN: [id number] Date of Birth: 1950/11/02

## 2015-05-14 NOTE — Therapy (Signed)
[facility] [address] [place], [place], [address] Phone: [phone]   Fax:  [phone]  Physical Therapy Treatment  Patient Details  Name: Carl [name] MRN: [id number] Date of Birth: January 21, 1951 Referring Provider: Alger Simons, MD  Encounter Date: 05/14/2015      PT End of Session - 05/14/15 1025    Visit Number 17   Number of Visits 25   Date for PT Re-Evaluation 06/08/15   Authorization Type MCR trad primary, BCBS secondary    PT Start Time 1020   PT Stop Time 1100   PT Time Calculation (min) 40 min   Activity Tolerance Patient tolerated treatment well   Behavior During Therapy [facility] for tasks assessed/performed      Past Medical History  Diagnosis Date  . Bradycardia   . Chronic abdominal pain   . Hesitancy of micturition     Past Surgical History  Procedure Laterality Date  . Anterior cervical decomp/discectomy fusion N/A 11/14/2014    Procedure: Cervical Five-Cervical Six  Anterior Cervical Decompression/Diskectomy/Fusion;  Surgeon: Erline Levine, MD;  Location: [place] [facility];  Service: Neurosurgery;  Laterality: N/A;    There were no vitals filed for this visit.  Visit Diagnosis:  Lack of coordination  Decreased functional activity tolerance  Decreased strength of trunk and back  Generalized weakness  Left-sided weakness  Unsteadiness  Abnormality of gait      Subjective Assessment - 05/14/15 1023    Subjective No new compalints. No falls to report. Has the MRI scheduled for this coming Friday.   Limitations Walking;Standing;House hold activities;Sitting;Lifting   Patient Stated Goals "to walk without this walker"   Currently in Pain? Yes   Pain Score 4    Pain Location Arm   Pain Orientation Right;Left   Pain Descriptors / Indicators Throbbing;Sharp;Tingling   Pain Type Acute pain   Pain Radiating Towards shouders across back and down into hands   Pain Onset 1 to 4 weeks ago    Aggravating Factors  worse in the am   Pain Relieving Factors pain meds, activity           [facility] Adult PT Treatment/Exercise - 05/14/15 1026    Ambulation/Gait   Ambulation/Gait Yes   Ambulation/Gait Assistance 4: Min guard   Ambulation Distance (Feet) 230 Feet   Assistive device None   Gait Pattern Step-through pattern;Decreased stride length;Decreased arm swing - right;Decreased arm swing - left;Left flexed knee in stance;Decreased stance time - left;Antalgic;Trunk flexed;Narrow base of support   Ambulation Surface Level;Indoor     Exercises: Hook lying Bridge with green band resisted hip abduction/ER, 5 sec hold x 10 reps  Prone Glut kick backs, 2 sets of 10 reps each leg Straight leg raises (hip extension), 2 sets of 10 reps each leg  Tall kneeling Partial sit backs, 2 sets of 10 reps  prone over red pball Alternating leg outs x 10 e3ach Alternating leg/Ue riases x 10 each  Seated edge of mat Sit<>stand with feet across red beam x 10 reps witout UE support        PT Short Term Goals - 04/10/15 1455    PT SHORT TERM GOAL #1   Title Pt will initiate HEP for balance and strengthening in order to indicate decreased fall risk and improved functional mobility.  (Target Date: 04/05/15)   Baseline met 04/10/15   Status Achieved   PT SHORT TERM GOAL #2   Title Pt will increase gait speed to 2.03 ft/ sec  in order to indicate decreased fall risk and improved efficiency of gait.     Baseline 1.68 ft/sec on 04/10/15   Status Partially Met   PT SHORT TERM GOAL #3   Title Pt will increase BERG balance test to 42/56 in order to indicate decreased fall risk and improved functional balance.    Baseline 50/56 on 04/10/15   Status Achieved   PT SHORT TERM GOAL #4   Title Pt will ambulate >150' on indoor surfaces w/ SPC at mod I level in order to indicate safe home negotiation.     Baseline 230' with SPC at mod I level on 04/10/15   Status Achieved   PT SHORT TERM GOAL #5   Title Pt  will perform flight of stairs w/ single handrail in alternating pattern at mod I level in order to indicate safe negotiation in home.     Baseline met 04/10/15   Status Achieved   PT SHORT TERM GOAL #6   Title Pt will verbalize fall prevention strategies independently in order to better decrease risk of falling at home.     Baseline Pt needs mod cues to recall    Status Partially Met           PT Long Term Goals - 05/09/15 1110    PT LONG TERM GOAL #1   Title Pt will be independent with HEP for strengthening and balance in order to indicate improved functional mobility and decreased balance.  (Target Date: 05/02/14)   Baseline 05/09/15- met with program issued to date   PT [place] #2   Title Pt will increase gait speed to 2.63 ft/sec in order to indicate decreased fall risk and and indicate pt is safe community ambulator.  (Modified Target Date: 06/06/15)   Baseline 05/09/15: 1.35 ft/sec with straight cane; (1.50 ft/sec with straight cane on 05/03/15)   Status Not Met   PT LONG TERM GOAL #3   Title Pt will improve BERG balance test score to 46/56 in order to indicate decreased fall risk and improved functional balance.    Baseline 05/09/15: scored 48/56 today; (50/56 04/10/15)   Status Achieved   PT LONG TERM GOAL #4   Title Pt will ambulate >500' without AD on paved outdoor surfaces and unlevel surfaces (grass) at mod I level in order to indicate safe community and outdoor home negotiation.  (Modified Target Date: 06/06/15)   Baseline 05/09/15: requires supervision to min/min gaurd assist on complaint outdoor surfaces at times   Status Not Met   PT LONG TERM GOAL #5   Title Pt will indicate return to leisure activities in order to indicate safe return to community functions.  (Modified Target Date: 06/06/15)   Baseline 05/09/15: has not returned to these activities   Status Not Met   Additional Long Term Goals   Additional Long Term Goals Yes   PT LONG TERM GOAL #6   Title Pt will increase DGI  >19/24 in order to indicate decreased fall risk. (Modified Target Date: 06/06/15)   Status New           Plan - 05/14/15 1025    Clinical Impression Statement Skilled session focused on gait without AD and strengthening without any issues reported. Pt continues to demo forward flexed trunk with decreased trunk/UE movements during gait without AD. Pt reported no pain, only fatigue after exercises performed in session today.  Pt will benefit from skilled therapeutic intervention in order to improve on the following deficits Abnormal gait;Decreased activity tolerance;Decreased balance;Decreased coordination;Decreased endurance;Decreased mobility;Decreased strength;Impaired perceived functional ability;Impaired sensation;Impaired UE functional use;Improper body mechanics;Postural dysfunction;Pain   Rehab Potential Excellent   PT Frequency 2x / week   PT Duration 8 weeks   PT Treatment/Interventions ADLs/Self Care Home Management;Electrical Stimulation;DME Instruction;Gait training;Stair training;Functional mobility training;Therapeutic exercise;Therapeutic activities;Balance training;Neuromuscular re-education;Patient/family education;Manual techniques   PT Next Visit Plan Continue to assess gait with SPC outdoors. Gait indoors without device for NMR, Gait training with cueing for upright posture, improved quality (needs glute med activation, isolated hip and knee flex. Address selective control in LLE. Exercises for posture strengthening/control, trunk strengthening, all of this working toward goals of PT renewal.                           Consulted and Agree with Plan of Care Patient        Problem List Patient Active Problem List   Diagnosis Date Noted  . Orthostatic hypotension 11/30/2014  . C6 spinal cord injury ([place]) 11/20/2014  . Neurogenic bowel 11/20/2014  . Neurogenic bladder 11/20/2014  . Fever of unknown origin   . Quadriplegia ([place]) 11/14/2014   . C5 vertebral fracture [facility]) 11/09/2014    Willow Ora 05/14/2015, 3:37 PM  Willow Ora, PTA, [facility] [address] [place], [place] [address] [phone] 05/14/2015, 3:38 PM   Name: Carl [name] MRN: [id number] Date of Birth: [birth year]

## 2015-05-17 ENCOUNTER — Encounter: Payer: Self-pay | Admitting: Occupational Therapy

## 2015-05-17 ENCOUNTER — Encounter: Payer: Self-pay | Admitting: Physical Therapy

## 2015-05-17 ENCOUNTER — Ambulatory Visit: Payer: Medicare Other | Admitting: Occupational Therapy

## 2015-05-17 ENCOUNTER — Ambulatory Visit: Payer: Medicare Other | Admitting: Physical Therapy

## 2015-05-17 DIAGNOSIS — R531 Weakness: Secondary | ICD-10-CM

## 2015-05-17 DIAGNOSIS — R269 Unspecified abnormalities of gait and mobility: Secondary | ICD-10-CM

## 2015-05-17 DIAGNOSIS — R279 Unspecified lack of coordination: Secondary | ICD-10-CM

## 2015-05-17 DIAGNOSIS — R6889 Other general symptoms and signs: Secondary | ICD-10-CM

## 2015-05-17 DIAGNOSIS — R29898 Other symptoms and signs involving the musculoskeletal system: Secondary | ICD-10-CM

## 2015-05-17 DIAGNOSIS — M25532 Pain in left wrist: Secondary | ICD-10-CM

## 2015-05-17 NOTE — Therapy (Signed)
[facility] [address] [place], [place], [address] Phone: [phone]   Fax:  [phone]  Physical Therapy Treatment  Patient Details  Name: Carl [name] MRN: [id number] Date of Birth: 1950/10/02 Referring Provider: Alger Simons, MD  Encounter Date: 05/17/2015   05/17/15 0941  PT Visits / Re-Eval  Visit Number 18  Number of Visits 25  Date for PT Re-Evaluation 06/08/15  Authorization  Authorization Type MCR trad primary, BCBS secondary   PT Time Calculation  PT Start Time 0934  PT Stop Time 1015  PT Time Calculation (min) 41 min  PT - End of Session  Activity Tolerance Patient tolerated treatment well  Behavior During Therapy [facility] for tasks assessed/performed     Past Medical History  Diagnosis Date  . Bradycardia   . Chronic abdominal pain   . Hesitancy of micturition     Past Surgical History  Procedure Laterality Date  . Anterior cervical decomp/discectomy fusion N/A 11/14/2014    Procedure: Cervical Five-Cervical Six  Anterior Cervical Decompression/Diskectomy/Fusion;  Surgeon: Erline Levine, MD;  Location: [place];  Service: Neurosurgery;  Laterality: N/A;    There were no vitals filed for this visit.  Visit Diagnosis:   Lack of coordination    Decreased functional activity tolerance    Decreased strength of trunk and back     Generalized weakness   .  Abnormality of gait         Subjective Assessment - 05/17/15 0938    Subjective No new compalints. No falls to report.    Patient is accompained by: Family member   Limitations Walking;Standing;House hold activities;Sitting;Lifting   Patient Stated Goals "to walk without this walker"   Currently in Pain? Yes   Pain Score 4    Pain Location Arm   Pain Orientation Right;Left  right > left   Pain Descriptors / Indicators Tingling;Throbbing;Sharp   Pain Type Chronic pain   Pain Onset More than a month ago   Pain Frequency  Constant   Aggravating Factors  worse in morning   Pain Relieving Factors pain meds, activity          [facility] Adult PT Treatment/Exercise - 05/17/15 0941    Ambulation/Gait   Ambulation/Gait Yes   Ambulation/Gait Assistance 4: Min guard;4: Min assist   Ambulation/Gait Assistance Details min assist x1 due to loss of balance with right toe scuffing, cues to correct gait deviations. cues for increased arm swing, increased foot clearance, increased hip/knee flexion with swing phase and to look ahead/cervical extension to neutral.                        Ambulation Distance (Feet) 230 Feet  x 2 reps   Assistive device None   Gait Pattern Step-through pattern;Decreased stride length;Decreased arm swing - right;Decreased arm swing - left;Left flexed knee in stance;Decreased stance time - left;Antalgic;Trunk flexed;Narrow base of support   Ambulation Surface Level;Unlevel     Exercises: Hook lying Bridge with green band resisted hip abduction/ER, 5 sec hold, 2 sets of 10 reps  Prone Glut kick backs, 2 sets of 10 reps each leg Straight leg raises (hip extension), 2 sets of 10 reps each leg  Tall kneeling Partial sit backs while rolling red pball foward, 2 sets of 10 reps  Prone over red pball Alternating leg outs x 10 each Alternating leg/UE riases x 10 each  Hook lying/supine with red p-ball under legs Bridge 5 sec hold x  10 reps Lower trunk rotation left<>right, 5 sec hold x 10 reps Hamstring curls rolling red ball toward buttocks with feet, 5 sec hold x 10 reps       PT Short Term Goals - 04/10/15 1455    PT SHORT TERM GOAL #1   Title Pt will initiate HEP for balance and strengthening in order to indicate decreased fall risk and improved functional mobility.  (Target Date: 04/05/15)   Baseline met 04/10/15   Status Achieved   PT SHORT TERM GOAL #2   Title Pt will increase gait speed to 2.03 ft/ sec in order to indicate decreased fall risk and improved efficiency of gait.      Baseline 1.68 ft/sec on 04/10/15   Status Partially Met   PT SHORT TERM GOAL #3   Title Pt will increase BERG balance test to 42/56 in order to indicate decreased fall risk and improved functional balance.    Baseline 50/56 on 04/10/15   Status Achieved   PT SHORT TERM GOAL #4   Title Pt will ambulate >150' on indoor surfaces w/ SPC at mod I level in order to indicate safe home negotiation.     Baseline 230' with SPC at mod I level on 04/10/15   Status Achieved   PT SHORT TERM GOAL #5   Title Pt will perform flight of stairs w/ single handrail in alternating pattern at mod I level in order to indicate safe negotiation in home.     Baseline met 04/10/15   Status Achieved   PT SHORT TERM GOAL #6   Title Pt will verbalize fall prevention strategies independently in order to better decrease risk of falling at home.     Baseline Pt needs mod cues to recall    Status Partially Met           PT Long Term Goals - 05/09/15 1110    PT LONG TERM GOAL #1   Title Pt will be independent with HEP for strengthening and balance in order to indicate improved functional mobility and decreased balance.  (Target Date: 05/02/14)   Baseline 05/09/15- met with program issued to date   PT [place] #2   Title Pt will increase gait speed to 2.63 ft/sec in order to indicate decreased fall risk and and indicate pt is safe community ambulator.  (Modified Target Date: 06/06/15)   Baseline 05/09/15: 1.35 ft/sec with straight cane; (1.50 ft/sec with straight cane on 05/03/15)   Status Not Met   PT LONG TERM GOAL #3   Title Pt will improve BERG balance test score to 46/56 in order to indicate decreased fall risk and improved functional balance.    Baseline 05/09/15: scored 48/56 today; (50/56 04/10/15)   Status Achieved   PT LONG TERM GOAL #4   Title Pt will ambulate >500' without AD on paved outdoor surfaces and unlevel surfaces (grass) at mod I level in order to indicate safe community and outdoor home negotiation.   (Modified Target Date: 06/06/15)   Baseline 05/09/15: requires supervision to min/min gaurd assist on complaint outdoor surfaces at times   Status Not Met   PT LONG TERM GOAL #5   Title Pt will indicate return to leisure activities in order to indicate safe return to community functions.  (Modified Target Date: 06/06/15)   Baseline 05/09/15: has not returned to these activities   Status Not Met   Additional Long Term Goals   Additional Long Term Goals Yes   PT LONG TERM  GOAL #6   Title Pt will increase DGI >19/24 in order to indicate decreased fall risk. (Modified Target Date: 06/06/15)   Status New        05/17/15 0941  Plan  Clinical Impression Statement Continued to work on gait without AD with cues to correct posture and gait deviations. Also, continued to work on core/LE strengthening and flexibility without any issues reported. Pt is making steady progress toward goals.  Pt will benefit from skilled therapeutic intervention in order to improve on the following deficits Abnormal gait;Decreased activity tolerance;Decreased balance;Decreased coordination;Decreased endurance;Decreased mobility;Decreased strength;Impaired perceived functional ability;Impaired sensation;Impaired UE functional use;Improper body mechanics;Postural dysfunction;Pain  Rehab Potential Excellent  PT Frequency 2x / week  PT Duration 8 weeks  PT Treatment/Interventions ADLs/Self Care Home Management;Electrical Stimulation;DME Instruction;Gait training;Stair training;Functional mobility training;Therapeutic exercise;Therapeutic activities;Balance training;Neuromuscular re-education;Patient/family education;Manual techniques  PT Next Visit Plan Continue to assess gait with SPC outdoors. Gait indoors without device for NMR, Gait training with cueing for upright posture, improved quality (needs glute med activation, isolated hip and knee flex. Address selective control in LLE. Exercises for posture strengthening/control, trunk  strengthening, all of this working toward goals of PT renewal.                          Consulted and Agree with Plan of Care Patient      Problem List Patient Active Problem List   Diagnosis Date Noted  . Orthostatic hypotension 11/30/2014  . C6 spinal cord injury ([place]) 11/20/2014  . Neurogenic bowel 11/20/2014  . Neurogenic bladder 11/20/2014  . Fever of unknown origin   . Quadriplegia ([place]) 11/14/2014  . C5 vertebral fracture [facility]) 11/09/2014    Willow Ora 05/17/2015, 9:54 AM  Willow Ora, PTA, [place] [address], [place], [place] [address] [phone] 05/17/2015, 9:54 AM   Name: Carl [name] MRN: [id number] Date of Birth: 02-Sep-1950

## 2015-05-17 NOTE — Therapy (Signed)
[facility] [address] [place], [place], [address] Phone: [phone]   Fax:  [phone]  Occupational Therapy Treatment  Patient Details  Name: Carl [name] MRN: [id number] Date of Birth: Aug 17, 1950 Referring Provider: Alger Simons  Encounter Date: 05/17/2015      OT End of Session - 05/17/15 1246    Visit Number 18   Number of Visits 23   Date for OT Re-Evaluation 06/01/15   Authorization Type MCR - G code needed   Authorization Time Period week 1/4 (renewal period beginning 05/07/15)   Authorization - Visit Number 18   Authorization - Number of Visits 20   OT Start Time 1017   OT Stop Time 1100   OT Time Calculation (min) 43 min   Activity Tolerance Patient tolerated treatment well      Past Medical History  Diagnosis Date  . Bradycardia   . Chronic abdominal pain   . Hesitancy of micturition     Past Surgical History  Procedure Laterality Date  . Anterior cervical decomp/discectomy fusion N/A 11/14/2014    Procedure: Cervical Five-Cervical Six  Anterior Cervical Decompression/Diskectomy/Fusion;  Surgeon: Erline Levine, MD;  Location: [facility];  Service: Neurosurgery;  Laterality: N/A;    There were no vitals filed for this visit.  Visit Diagnosis:  Decreased grip strength of left hand  Lack of coordination  Decreased functional activity tolerance  Pain in left wrist  Left-sided weakness      Subjective Assessment - 05/17/15 1023    Subjective  I think overall activity helps some of this pain   Pertinent History see epic   Patient Stated Goals increase strength and decrease tingling   Currently in Pain? Yes   Pain Score 3    Pain Location Arm   Pain Orientation Right;Left   Pain Descriptors / Indicators Sore;Throbbing   Pain Type Chronic pain   Pain Onset More than a month ago   Pain Frequency Constant   Aggravating Factors  wrose in morning   Pain Relieving Factors pain meds,  activity   Effect of Pain on Daily Activities affects ability to use my arms   Multiple Pain Sites Yes   Pain Score 3   Pain Location Back   Pain Orientation Right;Left;Mid   Pain Descriptors / Indicators Sore;Throbbing   Pain Type Chronic pain   Pain Onset More than a month ago   Pain Frequency Constant   Aggravating Factors  not sure every once  in while it just seems to cramp up   Pain Relieving Factors recline of somthing soft   Effect of Pain on Daily Activities walking and moving                      OT Treatments/Exercises (OP) - 05/17/15 0001    Neurological Re-education Exercises   Other Exercises 1 Neuro re ed in sitting with emphasis on trunk alignment and postural control, decreasing kyphotic posture to allow improved UE function and decrease pain in back/shoulders. Taught pt supported sitting for stretch as well as activites to open chest and encourage scapular depression, adduction and downward rotation.  Pt reported no pain in back  by end of session and less pain in shoulders and arms.  Will review activities once more with pt and then incorporate into HEP                  OT Short Term Goals - 05/17/15 1244  OT SHORT TERM GOAL #1   Title Pt will increase LUE gross grip to 30 lbs to help increase overall functional use and independence of L hand - 04/05/2015   Baseline 20lbs    Time 4   Period Weeks   Status Not Met  20 pounds 04/10/2015   OT SHORT TERM GOAL #2   Title Pt will be educated on trunk/core strengthening exercises to increase overall posture and strength in order to safely perform ADLs   Time 4   Period Weeks   Status Achieved   OT SHORT TERM GOAL #3   Title Pt will be educated on desensitization exercises to decrease hypersensitivity to LUE    Time 4   Period Weeks   Status Achieved   OT SHORT TERM GOAL #4   Title Pt will be independent with self correcting poor tecnique and compensatory strategies to left shoulder when  functionally using LUE   Time 4   Period Weeks   Status Achieved           OT Long Term Goals - 05/17/15 1244    OT LONG TERM GOAL #1   Title Pt will increase LUE gross grip to 28 lbs to help increase overall functional use and independence of L hand - 06/05/15   Baseline 20lbs at eval. 05/01/15 = 22 lbs    Time 8   Period Weeks   Status On-going  for renewal period   OT LONG TERM GOAL #2   Title Pt will be independent with LB ADLs   Baseline min assist    Time 8   Period Weeks   Status Achieved   OT LONG TERM GOAL #3   Title Pt will improve 9-hole peg test time to less than 35 seconds    Baseline 73 seconds. 05/01/15 = 41 sec.    Time 8   Period Weeks   Status On-going  for renewal period   OT LONG TERM GOAL #4   Title Pt will improve left wrist strength to 4/5 strength    Baseline 2+/5 strength    Time 8   Period Weeks   Status On-going  for renewal    OT LONG TERM GOAL #5   Title Pt will improve lateral and three pinch strength by at least 5lbs   Baseline 7lbs for both lateal and three pinch. 05/01/15 = 8 lbs for both lateral and three pinch   Time 8   Period Weeks   Status On-going   OT LONG TERM GOAL #6   Title Pt able to consistently retrieve/replace light weight objects from higher shelf LUE and carry cup and/or plate without drops/spills Lt hand   Time 4   Period Weeks   Status On-going               Plan - 05/17/15 1244    Clinical Impression Statement Pt slowly progressing toward goals.  Pt with slightly less pain today and less stiffness. Pt with imrpoved posture at end of session and will work toward greater carryover outside of therapy.   Pt will benefit from skilled therapeutic intervention in order to improve on the following deficits (Retired) Decreased coordination;Decreased range of motion;Impaired flexibility;Impaired sensation;Decreased endurance;Improper spinal/pelvic alignment;Decreased activity tolerance;Impaired tone;Decreased  balance;Decreased knowledge of use of DME;Impaired UE functional use;Pain;Decreased mobility;Decreased strength   Rehab Potential Good   Clinical Impairments Affecting Rehab Potential None known at this time   OT Frequency 2x / week   OT Duration 4 weeks  OT Treatment/Interventions Self-care/ADL training;Electrical Stimulation;Moist Heat;Traction;Therapeutic exercise;Neuromuscular education;Energy conservation;Functional Mobility Training;Passive range of motion;Therapeutic exercises;Therapeutic activities;Patient/family education;Balance training;Fluidtherapy   Plan monitor pain, ROM and strength in L hand, balance, overhead reach and carry with LUE   OT Home Exercise Plan coordination and putty HEP, desensitization techniques 03/14/15, theraband HEP 03/16/15   Consulted and Agree with Plan of Care Patient        Problem List Patient Active Problem List   Diagnosis Date Noted  . Orthostatic hypotension 11/30/2014  . C6 spinal cord injury ([place]) 11/20/2014  . Neurogenic bowel 11/20/2014  . Neurogenic bladder 11/20/2014  . Fever of unknown origin   . Quadriplegia ([place]) 11/14/2014  . C5 vertebral fracture ([place]) 11/09/2014    Quay Burow , OTR/L  05/17/2015, 12:48 PM  [facility] [address] [place], [place], [address] Phone: [phone]   Fax:  [phone]  Name: Carl [name] MRN: [id number] Date of Birth: [birth year]

## 2015-05-18 ENCOUNTER — Ambulatory Visit
Admission: RE | Admit: 2015-05-18 | Discharge: 2015-05-18 | Disposition: A | Discharge status: Home / Self Care | Payer: Medicare Other | Source: Ambulatory Visit | Attending: Physical Medicine & Rehabilitation | Admitting: Physical Medicine & Rehabilitation

## 2015-05-18 DIAGNOSIS — S12491S Other nondisplaced fracture of fifth cervical vertebra, sequela: Secondary | ICD-10-CM

## 2015-05-18 DIAGNOSIS — S14106S Unspecified injury at C6 level of cervical spinal cord, sequela: Secondary | ICD-10-CM

## 2015-05-18 DIAGNOSIS — G8250 Quadriplegia, unspecified: Secondary | ICD-10-CM

## 2015-05-21 ENCOUNTER — Ambulatory Visit: Payer: Medicare Other | Admitting: Occupational Therapy

## 2015-05-21 ENCOUNTER — Encounter: Payer: Self-pay | Admitting: Occupational Therapy

## 2015-05-21 ENCOUNTER — Ambulatory Visit: Payer: Medicare Other | Admitting: *Deleted

## 2015-05-21 ENCOUNTER — Encounter: Payer: Self-pay | Admitting: *Deleted

## 2015-05-21 DIAGNOSIS — R6889 Other general symptoms and signs: Secondary | ICD-10-CM | POA: Diagnosis not present

## 2015-05-21 DIAGNOSIS — R279 Unspecified lack of coordination: Secondary | ICD-10-CM

## 2015-05-21 DIAGNOSIS — M25532 Pain in left wrist: Secondary | ICD-10-CM

## 2015-05-21 DIAGNOSIS — R29898 Other symptoms and signs involving the musculoskeletal system: Secondary | ICD-10-CM

## 2015-05-21 DIAGNOSIS — R2681 Unsteadiness on feet: Secondary | ICD-10-CM

## 2015-05-21 DIAGNOSIS — R531 Weakness: Secondary | ICD-10-CM

## 2015-05-21 DIAGNOSIS — R269 Unspecified abnormalities of gait and mobility: Secondary | ICD-10-CM

## 2015-05-21 NOTE — Therapy (Signed)
[facility] [address] [place], [place], [address] Phone: [phone]   Fax:  [phone]  Occupational Therapy Treatment  Patient Details  Name: Carl [name] MRN: [id number] Date of Birth: 02-Jan-1951 Referring Provider: Alger Simons  Encounter Date: 05/21/2015      OT End of Session - 05/21/15 1259    Visit Number 19   Number of Visits 23   Date for OT Re-Evaluation 06/01/15   Authorization Type MCR - G code needed   Authorization - Visit Number 19   Authorization - Number of Visits 20   OT Start Time 5997   OT Stop Time 1059   OT Time Calculation (min) 44 min   Activity Tolerance Patient tolerated treatment well      Past Medical History  Diagnosis Date  . Bradycardia   . Chronic abdominal pain   . Hesitancy of micturition     Past Surgical History  Procedure Laterality Date  . Anterior cervical decomp/discectomy fusion N/A 11/14/2014    Procedure: Cervical Five-Cervical Six  Anterior Cervical Decompression/Diskectomy/Fusion;  Surgeon: Erline Levine, MD;  Location: [facility];  Service: Neurosurgery;  Laterality: N/A;    There were no vitals filed for this visit.  Visit Diagnosis:  Decreased grip strength of left hand  Lack of coordination  Pain in left wrist  Decreased pinch strength      Subjective Assessment - 05/21/15 1021    Subjective  I have less pain today - this is the best I have felt in a long time   Pertinent History see epic   Patient Stated Goals increase strength and decrease tingling   Currently in Pain? Yes   Pain Score 1    Pain Location Arm   Pain Orientation Right;Left   Pain Descriptors / Indicators Sore   Pain Type Chronic pain   Pain Onset More than a month ago   Pain Frequency Constant   Aggravating Factors  worse in the morning    Pain Relieving Factors pain meds, activity   Effect of Pain on Daily Activities affects ability to use my arms   Multiple Pain  Sites Yes   Pain Score 2   Pain Location Back   Pain Orientation Right;Left   Pain Descriptors / Indicators Sore   Pain Type Chronic pain   Pain Onset More than a month ago   Pain Frequency Constant   Aggravating Factors  not sure   Pain Relieving Factors reckine on something soft                      OT Treatments/Exercises (OP) - 05/21/15 0001    Exercises   Exercises Shoulder   Shoulder Exercises: Seated   Other Seated Exercises Upgraded pt's home exercise program for UE with emphasis on UE strenthening, flexiblity and posture. See pt instruction for details. Pt able to complete 12 reps x2 of all exercises independently after instruction and practice.    Neurological Re-education Exercises   Other Exercises 1 Neuro re ed to address active trunk control with emphasis on moving actively into patterns of extension. Pt needs  min facilitation.                 OT Education - 05/21/15 1257    Education provided Yes   Education Details upgraded HEP for UE's   Person(s) Educated Patient   Methods Explanation;Demonstration;Verbal cues;Handout   Comprehension Verbalized understanding;Returned demonstration  OT Short Term Goals - 05/21/15 1257    OT SHORT TERM GOAL #1   Title Pt will increase LUE gross grip to 30 lbs to help increase overall functional use and independence of L hand - 04/05/2015   Baseline 20lbs    Time 4   Period Weeks   Status Not Met  20 pounds 04/10/2015   OT SHORT TERM GOAL #2   Title Pt will be educated on trunk/core strengthening exercises to increase overall posture and strength in order to safely perform ADLs   Time 4   Period Weeks   Status Achieved   OT SHORT TERM GOAL #3   Title Pt will be educated on desensitization exercises to decrease hypersensitivity to LUE    Time 4   Period Weeks   Status Achieved   OT SHORT TERM GOAL #4   Title Pt will be independent with self correcting poor tecnique and compensatory  strategies to left shoulder when functionally using LUE   Time 4   Period Weeks   Status Achieved           OT Long Term Goals - 05/21/15 1257    OT LONG TERM GOAL #1   Title Pt will increase LUE gross grip to 28 lbs to help increase overall functional use and independence of L hand - 06/05/15   Baseline 20lbs at eval. 05/01/15 = 22 lbs    Time 8   Period Weeks   Status On-going  for renewal period   OT LONG TERM GOAL #2   Title Pt will be independent with LB ADLs   Baseline min assist    Time 8   Period Weeks   Status Achieved   OT LONG TERM GOAL #3   Title Pt will improve 9-hole peg test time to less than 35 seconds    Baseline 73 seconds. 05/01/15 = 41 sec.    Time 8   Period Weeks   Status On-going  for renewal period   OT LONG TERM GOAL #4   Title Pt will improve left wrist strength to 4/5 strength    Baseline 2+/5 strength    Time 8   Period Weeks   Status On-going  for renewal    OT LONG TERM GOAL #5   Title Pt will improve lateral and three pinch strength by at least 5lbs   Baseline 7lbs for both lateal and three pinch. 05/01/15 = 8 lbs for both lateral and three pinch   Time 8   Period Weeks   Status On-going   OT LONG TERM GOAL #6   Title Pt able to consistently retrieve/replace light weight objects from higher shelf LUE and carry cup and/or plate without drops/spills Lt hand   Time 4   Period Weeks   Status On-going               Plan - 05/21/15 1258    Clinical Impression Statement Pt making progress toward goals. Pt with decreased pain and stiffness today and improve posture   Pt will benefit from skilled therapeutic intervention in order to improve on the following deficits (Retired) Decreased coordination;Decreased range of motion;Impaired flexibility;Impaired sensation;Decreased endurance;Improper spinal/pelvic alignment;Decreased activity tolerance;Impaired tone;Decreased balance;Decreased knowledge of use of DME;Impaired UE functional  use;Pain;Decreased mobility;Decreased strength   Rehab Potential Good   Clinical Impairments Affecting Rehab Potential None known at this time   OT Frequency 2x / week   OT Duration 4 weeks   OT Treatment/Interventions Self-care/ADL training;Electrical Stimulation;Moist  Heat;Traction;Therapeutic exercise;Neuromuscular education;Energy conservation;Functional Mobility Training;Passive range of motion;Therapeutic exercises;Therapeutic activities;Patient/family education;Balance training;Fluidtherapy   Plan monitor pain, ROM and strength in L hand, balance, overhead reach and carry with LUE   OT Home Exercise Plan coordination and putty HEP, desensitization techniques 03/14/15, theraband HEP 03/16/15   Consulted and Agree with Plan of Care Patient        Problem List Patient Active Problem List   Diagnosis Date Noted  . Orthostatic hypotension 11/30/2014  . C6 spinal cord injury ([place]) 11/20/2014  . Neurogenic bowel 11/20/2014  . Neurogenic bladder 11/20/2014  . Fever of unknown origin   . Quadriplegia ([place]) 11/14/2014  . C5 vertebral fracture ([place]) 11/09/2014    Quay Burow, OTR/L 05/21/2015, 1:01 PM  [facility] [address] [place], [place], [address] Phone: [phone]   Fax:  [phone]  Name: Carl [name] MRN: [id number] Date of Birth: 10/12/1950

## 2015-05-21 NOTE — Therapy (Signed)
[facility] [address] [place], [place], [address] Phone: [phone]   Fax:  [phone]  Physical Therapy Treatment  Patient Details  Name: Carl [name] MRN: [id number] Date of Birth: 1951-03-13 Referring Provider: Alger Simons, MD  Encounter Date: 05/21/2015      PT End of Session - 05/21/15 1158    Visit Number 19   Number of Visits 25   Date for PT Re-Evaluation 06/08/15   Authorization Type MCR trad primary, BCBS secondary    PT Start Time 1100   PT Stop Time 1140   PT Time Calculation (min) 40 min   Equipment Utilized During Treatment Gait belt   Activity Tolerance Patient tolerated treatment well   Behavior During Therapy [facility] for tasks assessed/performed      Past Medical History  Diagnosis Date  . Bradycardia   . Chronic abdominal pain   . Hesitancy of micturition     Past Surgical History  Procedure Laterality Date  . Anterior cervical decomp/discectomy fusion N/A 11/14/2014    Procedure: Cervical Five-Cervical Six  Anterior Cervical Decompression/Diskectomy/Fusion;  Surgeon: Erline Levine, MD;  Location: [facility];  Service: Neurosurgery;  Laterality: N/A;    There were no vitals filed for this visit.  Visit Diagnosis:  Unsteadiness  Decreased functional activity tolerance  Left-sided weakness  Decreased strength of trunk and back  Generalized weakness  Abnormality of gait      Subjective Assessment - 05/21/15 1100    Subjective better day in a long while, because of less pain.   Currently in Pain? Yes   Pain Score 1    Pain Location Arm  Left   Pain Orientation Left   Pain Descriptors / Indicators Throbbing   Pain Type Chronic pain   Pain Onset More than a month ago   Pain Frequency Constant   Pain Score 2   Pain Location Back   Pain Orientation Lower   Pain Descriptors / Indicators Dull   Pain Type Chronic pain   Pain Onset More than a month ago   Pain Frequency  Constant                         [facility] Adult PT Treatment/Exercise - 05/21/15 0001    Ambulation/Gait   Ambulation/Gait Yes   Ambulation/Gait Assistance 4: Min guard;4: Min assist   Ambulation/Gait Assistance Details LOB x1 with head turns, indoor surface with slight turn   Ambulation Distance (Feet) 400 Feet  indoors+ 300 outdoor paved (including incline and curb)   Assistive device Straight cane  just for outdoor surface   Gait Pattern Step-through pattern;Step-to pattern  step-pattern backwards, unequal steplength   Ambulation Surface Level;Unlevel;Indoor;Outdoor;Paved   Gait Comments balance with changes in speed, direction and visual scanning   Knee/Hip Exercises: Aerobic   Other Aerobic SCI Fit L= 5 45mn UE/LE  challenged at this resistance                PT Education - 05/21/15 1156    Education Details importance of practicing dynamic gait for functional balance and how to safely perform at home.   Person(s) Educated Patient   Methods Explanation;Demonstration   Comprehension Verbalized understanding;Returned demonstration          PT Short Term Goals - 05/21/15 1045    PT SHORT TERM GOAL #1   Title Pt will initiate HEP for balance and strengthening in order to indicate decreased fall risk and improved  functional mobility.  (Target Date: 04/05/15)   Baseline met 04/10/15   Status Achieved   PT SHORT TERM GOAL #2   Title Pt will increase gait speed to 2.03 ft/ sec in order to indicate decreased fall risk and improved efficiency of gait.     Baseline 1.68 ft/sec on 04/10/15   Status Partially Met   PT SHORT TERM GOAL #3   Title Pt will increase BERG balance test to 42/56 in order to indicate decreased fall risk and improved functional balance.    Baseline 50/56 on 04/10/15   Status Achieved   PT SHORT TERM GOAL #4   Title Pt will ambulate >150' on indoor surfaces w/ SPC at mod I level in order to indicate safe home negotiation.     Baseline  230' with SPC at mod I level on 04/10/15   Status Achieved   PT SHORT TERM GOAL #5   Title Pt will perform flight of stairs w/ single handrail in alternating pattern at mod I level in order to indicate safe negotiation in home.     Baseline met 04/10/15   Status Achieved   PT SHORT TERM GOAL #6   Title Pt will verbalize fall prevention strategies independently in order to better decrease risk of falling at home.     Baseline Pt needs mod cues to recall    Status Partially Met           PT Long Term Goals - 05/21/15 1045    PT LONG TERM GOAL #1   Title Pt will be independent with HEP for strengthening and balance in order to indicate improved functional mobility and decreased balance.  (Target Date: 05/02/14)   Baseline 05/09/15- met with program issued to date   PT [place] #2   Title Pt will increase gait speed to 2.63 ft/sec in order to indicate decreased fall risk and and indicate pt is safe community ambulator.  (Modified Target Date: 06/06/15)   Baseline 05/09/15: 1.35 ft/sec with straight cane; (1.50 ft/sec with straight cane on 05/03/15)   Status Not Met   PT LONG TERM GOAL #3   Title Pt will improve BERG balance test score to 46/56 in order to indicate decreased fall risk and improved functional balance.    Baseline 05/09/15: scored 48/56 today; (50/56 04/10/15)   Status Achieved   PT LONG TERM GOAL #4   Title Pt will ambulate >500' without AD on paved outdoor surfaces and unlevel surfaces (grass) at mod I level in order to indicate safe community and outdoor home negotiation.  (Modified Target Date: 06/06/15)   Baseline 05/09/15: requires supervision to min/min gaurd assist on complaint outdoor surfaces at times   Status Not Met   PT LONG TERM GOAL #5   Title Pt will indicate return to leisure activities in order to indicate safe return to community functions.  (Modified Target Date: 06/06/15)   Baseline 05/09/15: has not returned to these activities   Status Not Met   PT LONG TERM GOAL #6    Title Pt will increase DGI >19/24 in order to indicate decreased fall risk. (Modified Target Date: 06/06/15)   Status New               Plan - 05/21/15 1159    Clinical Impression Statement Skilled session focused on dynamic gait and fuctional balance, and LE strengthening. Pt continues to have minor LOB with dynamic gait on level surfaces, reports fatigue after 31mn cont. amb. indoor without  AD and need of AD when faitigued during gait outdoors.  Gradual progress towards goals.   Pt will benefit from skilled therapeutic intervention in order to improve on the following deficits Abnormal gait;Decreased activity tolerance;Decreased balance;Decreased coordination;Decreased endurance;Decreased mobility;Decreased strength;Impaired perceived functional ability;Impaired sensation;Impaired UE functional use;Improper body mechanics;Postural dysfunction;Pain   Rehab Potential Excellent   PT Frequency 2x / week   PT Duration 8 weeks   PT Treatment/Interventions ADLs/Self Care Home Management;Electrical Stimulation;DME Instruction;Gait training;Stair training;Functional mobility training;Therapeutic exercise;Therapeutic activities;Balance training;Neuromuscular re-education;Patient/family education;Manual techniques   PT Next Visit Plan G-code; Continue to assess gait with SPC outdoors. Gait indoors without device for NMR, Gait training with cueing for upright posture, improved quality (needs glute med activation, isolated hip and knee flex. Address selective control in LLE. Exercises for posture strengthening/control, trunk strengthening, all of this working toward goals of PT renewal.                           Consulted and Agree with Plan of Care Patient        Problem List Patient Active Problem List   Diagnosis Date Noted  . Orthostatic hypotension 11/30/2014  . C6 spinal cord injury ([place]) 11/20/2014  . Neurogenic bowel 11/20/2014  . Neurogenic bladder 11/20/2014  . Fever of unknown  origin   . Quadriplegia ([place]) 11/14/2014  . C5 vertebral fracture ([place]) 11/09/2014   Bjorn Loser, PTA  05/21/2015, 12:05 PM [facility] [address] [place], [place], [address] Phone: [phone]   Fax:  [phone]  Name: Sasha Rueth MRN: [id number] Date of Birth: March 19, 1951

## 2015-05-21 NOTE — Patient Instructions (Signed)
Home program for posture and strengthening arms: Do 1-2 times per day every day!!   Do all of these in SUPPORTED SITTING (firm chair, towel roll at lower back, towel roll down center of spine) !!!!  1.  Hold a 2 pound can of food in each hand.  Make sure you sit up tall, chest open, chin tucked.  Keeping elbows straight and thumbs pointing up, start with hands on your knees and raise over your head. Do 12, rest the do 12 more.    2. Hold 2 pound can of food in each hand. Start with arms by your side, palms facing toward your body.  Slowly raise your hands until they are even with your shoulders, keeping elbows straight. Hold for slow count of 5, then return to starting position. Do 12, rest then do 12 more.   3. Hold 2 pound can of food in each hand. Start with your hands at your shoulders, palms facing down, elbows bent. "Punch out" your hands, hold for slow count of five and return to starting position. Do 12, rest then do 12 more.   4.  Hold 2 pound can of food in each hand. Start with elbows bent, palms facing down, shoulders at 90*. Pull elbows back keeping shoulders DOWN and squeeze your shoulder blades, then return to starting position. Do 12, rest then do 12 more.

## 2015-05-23 ENCOUNTER — Telehealth: Payer: Self-pay

## 2015-05-23 NOTE — Telephone Encounter (Signed)
Called and discussed MRI with patient's wife

## 2015-05-23 NOTE — Telephone Encounter (Signed)
Pt would like to know the results of his MRI. Please advise. Thanks!

## 2015-05-24 ENCOUNTER — Ambulatory Visit: Payer: Medicare Other | Admitting: Occupational Therapy

## 2015-05-24 ENCOUNTER — Encounter: Payer: Self-pay | Admitting: Physical Therapy

## 2015-05-24 ENCOUNTER — Ambulatory Visit: Payer: Medicare Other | Admitting: Physical Therapy

## 2015-05-24 ENCOUNTER — Encounter: Payer: Self-pay | Admitting: Occupational Therapy

## 2015-05-24 DIAGNOSIS — R29898 Other symptoms and signs involving the musculoskeletal system: Secondary | ICD-10-CM

## 2015-05-24 DIAGNOSIS — R269 Unspecified abnormalities of gait and mobility: Secondary | ICD-10-CM

## 2015-05-24 DIAGNOSIS — R6889 Other general symptoms and signs: Secondary | ICD-10-CM

## 2015-05-24 DIAGNOSIS — R2681 Unsteadiness on feet: Secondary | ICD-10-CM

## 2015-05-24 DIAGNOSIS — M25532 Pain in left wrist: Secondary | ICD-10-CM

## 2015-05-24 DIAGNOSIS — R531 Weakness: Secondary | ICD-10-CM

## 2015-05-24 DIAGNOSIS — R279 Unspecified lack of coordination: Secondary | ICD-10-CM

## 2015-05-24 NOTE — Therapy (Signed)
[facility] [address] [place], [place], [address] Phone: [phone]   Fax:  [phone]  Occupational Therapy Treatment  Patient Details  Name: Carl [name] MRN: [id number] Date of Birth: 11/28/1950 Referring Provider: Alger Simons  Encounter Date: 05/24/2015      OT End of Session - 05/24/15 1727    Visit Number 20   Number of Visits 23   Date for OT Re-Evaluation 06/01/15   Authorization Type MCR - G code needed   Authorization - Visit Number 20   Authorization - Number of Visits 20   OT Start Time 6812   OT Stop Time 1358   OT Time Calculation (min) 43 min   Activity Tolerance Patient tolerated treatment well      Past Medical History  Diagnosis Date  . Bradycardia   . Chronic abdominal pain   . Hesitancy of micturition     Past Surgical History  Procedure Laterality Date  . Anterior cervical decomp/discectomy fusion N/A 11/14/2014    Procedure: Cervical Five-Cervical Six  Anterior Cervical Decompression/Diskectomy/Fusion;  Surgeon: Erline Levine, MD;  Location: [facility];  Service: Neurosurgery;  Laterality: N/A;    There were no vitals filed for this visit.  Visit Diagnosis:  Decreased grip strength of left hand  Lack of coordination  Pain in left wrist  Decreased pinch strength      Subjective Assessment - 05/24/15 1321    Subjective  I am getting a little burned out   Pertinent History see epic   Patient Stated Goals increase strength and decrease tingling   Currently in Pain? Yes   Pain Score 5    Pain Location Arm   Pain Orientation Left   Pain Descriptors / Indicators Throbbing   Pain Type Chronic pain   Pain Onset More than a month ago   Pain Frequency Constant   Aggravating Factors  worse in the morning and I took my pain meds late - I just took them   Pain Relieving Factors pain meds, activity   Multiple Pain Sites Yes   Pain Score 4   Pain Location Back   Pain  Orientation Lower;Mid   Pain Descriptors / Indicators Aching;Dull   Pain Type Chronic pain   Pain Onset More than a month ago   Pain Frequency Constant   Aggravating Factors  didn't get stretch yesterday or today and I took my pain meds late   Pain Relieving Factors recline on something soft                      OT Treatments/Exercises (OP) - 05/24/15 0001    Hand Exercises   Other Hand Exercises Addressed grip strength bilaterally . Pt with gripper and 25# of resistance in LUE and 35# in RUE to pick up one inch cubes with min dropping.  Also addressd fine motor coordination in LUE with emphasis on in hand manipulation, speed, hand orientation. Pt with minimal difficulty but signinfcantly decreased speed.                   OT Short Term Goals - 05/24/15 1725    OT SHORT TERM GOAL #1   Title Pt will increase LUE gross grip to 30 lbs to help increase overall functional use and independence of L hand - 04/05/2015   Baseline 20lbs    Time 4   Period Weeks   Status Not Met  20 pounds 04/10/2015   OT SHORT  TERM GOAL #2   Title Pt will be educated on trunk/core strengthening exercises to increase overall posture and strength in order to safely perform ADLs   Time 4   Period Weeks   Status Achieved   OT SHORT TERM GOAL #3   Title Pt will be educated on desensitization exercises to decrease hypersensitivity to LUE    Time 4   Period Weeks   Status Achieved   OT SHORT TERM GOAL #4   Title Pt will be independent with self correcting poor tecnique and compensatory strategies to left shoulder when functionally using LUE   Time 4   Period Weeks   Status Achieved           OT Long Term Goals - 05/24/15 1725    OT LONG TERM GOAL #1   Title Pt will increase LUE gross grip to 28 lbs to help increase overall functional use and independence of L hand - 06/05/15   Baseline 20lbs at eval. 05/01/15 = 22 lbs    Time 8   Period Weeks   Status On-going  for renewal  period   OT LONG TERM GOAL #2   Title Pt will be independent with LB ADLs   Baseline min assist    Time 8   Period Weeks   Status Achieved   OT LONG TERM GOAL #3   Title Pt will improve 9-hole peg test time to less than 35 seconds    Baseline 73 seconds. 05/01/15 = 41 sec.    Time 8   Period Weeks   Status On-going  for renewal period   OT LONG TERM GOAL #4   Title Pt will improve left wrist strength to 4/5 strength    Baseline 2+/5 strength    Time 8   Period Weeks   Status Achieved  for renewal    OT LONG TERM GOAL #5   Title Pt will improve lateral and three pinch strength by at least 5lbs   Baseline 7lbs for both lateal and three pinch. 05/01/15 = 8 lbs for both lateral and three pinch   Time 8   Period Weeks   Status On-going   OT LONG TERM GOAL #6   Title Pt able to consistently retrieve/replace light weight objects from higher shelf LUE and carry cup and/or plate without drops/spills Lt hand   Time 4   Period Weeks   Status On-going               Plan - 05/24/15 1726    Clinical Impression Statement Pt with slow progress toward goals. Pt with fluctuating pain and stiffness from day to day that impacts UE use and functional mobility   Pt will benefit from skilled therapeutic intervention in order to improve on the following deficits (Retired) Decreased coordination;Decreased range of motion;Impaired flexibility;Impaired sensation;Decreased endurance;Improper spinal/pelvic alignment;Decreased activity tolerance;Impaired tone;Decreased balance;Decreased knowledge of use of DME;Impaired UE functional use;Pain;Decreased mobility;Decreased strength   Rehab Potential Good   Clinical Impairments Affecting Rehab Potential None known at this time   OT Frequency 2x / week   OT Duration 4 weeks   OT Treatment/Interventions Self-care/ADL training;Electrical Stimulation;Moist Heat;Traction;Therapeutic exercise;Neuromuscular education;Energy conservation;Functional Mobility  Training;Passive range of motion;Therapeutic exercises;Therapeutic activities;Patient/family education;Balance training;Fluidtherapy   Plan monitor pain, ROM and strength L hand, overhead reach and carry with L hand   OT Home Exercise Plan coordination and putty HEP, desensitization techniques 03/14/15, theraband HEP 03/16/15   Consulted and Agree with Plan of Care Patient  Problem List Patient Active Problem List   Diagnosis Date Noted  . Orthostatic hypotension 11/30/2014  . C6 spinal cord injury ([place]) 11/20/2014  . Neurogenic bowel 11/20/2014  . Neurogenic bladder 11/20/2014  . Fever of unknown origin   . Quadriplegia ([place]) 11/14/2014  . C5 vertebral fracture ([place]) 11/09/2014   Occupational Therapy Progress Note  Dates of Reporting Period: 1/10/2017to 05/24/2015  Objective Reports of Subjective Statement: see above  Objective Measurements: see above  Goal Update: see above Plan: see above  Reason Skilled Services are Required: decreased coordination and strength LUE  Quay Burow, OTR/L 05/24/2015, 5:29 PM  [facility] [address] [place], [place], [address] Phone: [phone]   Fax:  [phone]  Name: Carl [name] MRN: [id number] Date of Birth: 01-Jun-1950

## 2015-05-24 NOTE — Therapy (Signed)
[facility] [address] [place], [place], [address] Phone: [phone]   Fax:  [phone]  Physical Therapy Treatment  Patient Details  Name: Carl [name] MRN: [id number] Date of Birth: 01-09-1951 Referring Provider: Alger Simons, MD  Encounter Date: 05/24/2015      PT End of Session - 05/24/15 1407    Visit Number 20   Number of Visits 25   Date for PT Re-Evaluation 06/08/15   Authorization Type MCR trad primary, BCBS secondary    PT Start Time 1400   PT Stop Time 1443   PT Time Calculation (min) 43 min   Equipment Utilized During Treatment Gait belt   Activity Tolerance Patient tolerated treatment well   Behavior During Therapy [facility] for tasks assessed/performed      Past Medical History  Diagnosis Date  . Bradycardia   . Chronic abdominal pain   . Hesitancy of micturition     Past Surgical History  Procedure Laterality Date  . Anterior cervical decomp/discectomy fusion N/A 11/14/2014    Procedure: Cervical Five-Cervical Six  Anterior Cervical Decompression/Diskectomy/Fusion;  Surgeon: Erline Levine, MD;  Location: [facility];  Service: Neurosurgery;  Laterality: N/A;    There were no vitals filed for this visit.  Visit Diagnosis:  Unsteadiness  Decreased functional activity tolerance  Left-sided weakness  Decreased strength of trunk and back  Generalized weakness  Abnormality of gait      Subjective Assessment - 05/24/15 1405    Subjective No new complaints. No falls. Hurting all over today after Monday's session.   Limitations Walking;Standing;House hold activities;Sitting;Lifting   Patient Stated Goals "to walk without this walker"   Currently in Pain? Yes   Pain Score 4    Pain Location Generalized  left arm, back and legs   Pain Descriptors / Indicators Throbbing   Pain Type Chronic pain   Pain Onset More than a month ago   Pain Frequency Constant   Aggravating Factors  worse in  the mornier and since monday's treatment   Pain Relieving Factors pain meds, activity             [facility] Adult PT Treatment/Exercise - 05/24/15 1425    Ambulation/Gait   Ambulation/Gait Yes   Ambulation/Gait Assistance 4: Min guard   Ambulation/Gait Assistance Details cues for arm swing, to look forward and for increased/equal step length   Ambulation Distance (Feet) 210 Feet  x2   Assistive device None   Gait Pattern Step-through pattern;Decreased arm swing - left;Decreased stance time - left;Decreased stride length;Trunk flexed;Decreased hip/knee flexion - left;Decreased weight shift to left;Narrow base of support   Ambulation Surface Level;Indoor   Berg Balance Test   Sit to Stand Able to stand without using hands and stabilize independently   Standing Unsupported Able to stand safely 2 minutes   Sitting with Back Unsupported but Feet Supported on Floor or Stool Able to sit safely and securely 2 minutes   Stand to Sit Sits safely with minimal use of hands   Transfers Able to transfer safely, minor use of hands   Standing Unsupported with Eyes Closed Able to stand 10 seconds safely   Standing Ubsupported with Feet Together Able to place feet together independently and stand 1 minute safely   From Standing, Reach Forward with Outstretched Arm Can reach forward >12 cm safely (5")  8 inches   From Standing Position, Pick up Object from Floor Able to pick up shoe safely and easily   From Standing  Position, Turn to Look Behind Over each Shoulder Looks behind from both sides and weight shifts well   Turn 360 Degrees Able to turn 360 degrees safely but slowly  >5 seconds towards both sides   Standing Unsupported, Alternately Place Feet on Step/Stool Able to stand independently and safely and complete 8 steps in 20 seconds  19.28 sec's   Standing Unsupported, One Foot in Front Able to plae foot ahead of the other independently and hold 30 seconds   Standing on One Leg Able to lift leg  independently and hold > 10 seconds   Total Score 52      Exercises: hook lying on mat: Bridge 5 sec hold x 10 reps  With red p-ball under legs Bridge, 5 sec x 10 reps Lower trunk rotation left<>right, 5 sec hold x 10 reps Double knee to chest rolling ball for lower back stretch, 5 sec hold x 10 reps   Quadruped over red pball Alternating leg lifts x 10 each side Combo contralateral leg/UE lifts x 5 each side  Seated edge of mat: Upper trunk rotation left<>right, 10 sec hold x 5 each way         PT Short Term Goals - 05/21/15 1045    PT SHORT TERM GOAL #1   Title Pt will initiate HEP for balance and strengthening in order to indicate decreased fall risk and improved functional mobility.  (Target Date: 04/05/15)   Baseline met 04/10/15   Status Achieved   PT SHORT TERM GOAL #2   Title Pt will increase gait speed to 2.03 ft/ sec in order to indicate decreased fall risk and improved efficiency of gait.     Baseline 1.68 ft/sec on 04/10/15   Status Partially Met   PT SHORT TERM GOAL #3   Title Pt will increase BERG balance test to 42/56 in order to indicate decreased fall risk and improved functional balance.    Baseline 50/56 on 04/10/15   Status Achieved   PT SHORT TERM GOAL #4   Title Pt will ambulate >150' on indoor surfaces w/ SPC at mod I level in order to indicate safe home negotiation.     Baseline 230' with SPC at mod I level on 04/10/15   Status Achieved   PT SHORT TERM GOAL #5   Title Pt will perform flight of stairs w/ single handrail in alternating pattern at mod I level in order to indicate safe negotiation in home.     Baseline met 04/10/15   Status Achieved   PT SHORT TERM GOAL #6   Title Pt will verbalize fall prevention strategies independently in order to better decrease risk of falling at home.     Baseline Pt needs mod cues to recall    Status Partially Met           PT Long Term Goals - 05/21/15 1045    PT LONG TERM GOAL #1   Title Pt will be  independent with HEP for strengthening and balance in order to indicate improved functional mobility and decreased balance.  (Target Date: 05/02/14)   Baseline 05/09/15- met with program issued to date   PT [place] #2   Title Pt will increase gait speed to 2.63 ft/sec in order to indicate decreased fall risk and and indicate pt is safe community ambulator.  (Modified Target Date: 06/06/15)   Baseline 05/09/15: 1.35 ft/sec with straight cane; (1.50 ft/sec with straight cane on 05/03/15)   Status Not Met  PT LONG TERM GOAL #3   Title Pt will improve BERG balance test score to 46/56 in order to indicate decreased fall risk and improved functional balance.    Baseline 05/09/15: scored 48/56 today; (50/56 04/10/15)   Status Achieved   PT LONG TERM GOAL #4   Title Pt will ambulate >500' without AD on paved outdoor surfaces and unlevel surfaces (grass) at mod I level in order to indicate safe community and outdoor home negotiation.  (Modified Target Date: 06/06/15)   Baseline 05/09/15: requires supervision to min/min gaurd assist on complaint outdoor surfaces at times   Status Not Met   PT LONG TERM GOAL #5   Title Pt will indicate return to leisure activities in order to indicate safe return to community functions.  (Modified Target Date: 06/06/15)   Baseline 05/09/15: has not returned to these activities   Status Not Met   PT LONG TERM GOAL #6   Title Pt will increase DGI >19/24 in order to indicate decreased fall risk. (Modified Target Date: 06/06/15)   Status New           Plan - 2015-06-21 1408    Clinical Impression Statement Pt has improved his berg score to 52/56 from 50/56. Remainder of skilled session focused on increased flexibility and gait without AD.   Pt will benefit from skilled therapeutic intervention in order to improve on the following deficits Abnormal gait;Decreased activity tolerance;Decreased balance;Decreased coordination;Decreased endurance;Decreased mobility;Decreased  strength;Impaired perceived functional ability;Impaired sensation;Impaired UE functional use;Improper body mechanics;Postural dysfunction;Pain   Rehab Potential Excellent   PT Frequency 2x / week   PT Duration 8 weeks   PT Treatment/Interventions ADLs/Self Care Home Management;Electrical Stimulation;DME Instruction;Gait training;Stair training;Functional mobility training;Therapeutic exercise;Therapeutic activities;Balance training;Neuromuscular re-education;Patient/family education;Manual techniques   PT Next Visit Plan Continue to assess gait with SPC outdoors. Gait indoors without device for NMR, Gait training with cueing for upright posture, improved quality (needs glute med activation, isolated hip and knee flex. Address selective control in LLE. Exercises for posture strengthening/control, trunk strengthening, all of this working toward goals of PT renewal.                           Consulted and Agree with Plan of Care Patient          G-Codes - 06-21-15 1630    Functional Assessment Tool Used berg balance test 52/56   Functional Limitation Mobility: Walking and moving around   Mobility: Walking and Moving Around Current Status [phone]) At least 1 percent but less than 20 percent impaired, limited or restricted   Mobility: Walking and Moving Around Goal Status [phone]) At least 1 percent but less than 20 percent impaired, limited or restricted      Gcode completed by:  Cameron Sprang, PT, MPT [facility] [address] [place], [place], [address] Phone: [phone]   Fax:  [phone] 05/25/2015, 1:41 PM  Physical Therapy Progress Note  Dates of Reporting Period: 04/17/15 to 06/21/2015  Objective Reports of Subjective Statement: see subjective statements above  Objective Measurements: BERG 52/56  Goal Update: See goals above  Plan: Continue POC   Reason Skilled Services are Required: Pt continues to have high level balance deficits as  well as strength and sensory deficits, esp in LLE/UE.      Problem List Patient Active Problem List   Diagnosis Date Noted  . Orthostatic hypotension 11/30/2014  . C6 spinal cord injury ([place]) 11/20/2014  .  Neurogenic bowel 11/20/2014  . Neurogenic bladder 11/20/2014  . Fever of unknown origin   . Quadriplegia ([place]) 11/14/2014  . C5 vertebral fracture ([place]) 11/09/2014    Parcell, Betha Loa 05/25/2015, 1:40 PM  Willow Ora, PTA, [facility] [address] [place], [place] [address] [phone] 05/25/2015, 1:40 PM   Name: Carl [name] MRN: [id number] Date of Birth: 12/27/1950

## 2015-05-28 ENCOUNTER — Ambulatory Visit: Payer: Medicare Other | Admitting: Rehabilitation

## 2015-05-28 ENCOUNTER — Encounter: Payer: Self-pay | Admitting: Rehabilitation

## 2015-05-28 ENCOUNTER — Encounter: Payer: Self-pay | Admitting: Occupational Therapy

## 2015-05-28 ENCOUNTER — Ambulatory Visit: Payer: Medicare Other | Admitting: Occupational Therapy

## 2015-05-28 DIAGNOSIS — R29898 Other symptoms and signs involving the musculoskeletal system: Secondary | ICD-10-CM

## 2015-05-28 DIAGNOSIS — R269 Unspecified abnormalities of gait and mobility: Secondary | ICD-10-CM

## 2015-05-28 DIAGNOSIS — R531 Weakness: Secondary | ICD-10-CM

## 2015-05-28 DIAGNOSIS — R279 Unspecified lack of coordination: Secondary | ICD-10-CM

## 2015-05-28 DIAGNOSIS — R6889 Other general symptoms and signs: Secondary | ICD-10-CM | POA: Diagnosis not present

## 2015-05-28 DIAGNOSIS — M25532 Pain in left wrist: Secondary | ICD-10-CM

## 2015-05-28 DIAGNOSIS — R2681 Unsteadiness on feet: Secondary | ICD-10-CM

## 2015-05-28 NOTE — Therapy (Signed)
[facility] [place] [address] [place], [place], [address] Phone: [phone]   Fax:  [phone]  Physical Therapy Treatment  Patient Details  Name: Carl [name] MRN: [id number] Date of Birth: 23-Oct-1950 Referring Provider: Alger Simons, MD  Encounter Date: 05/28/2015      PT End of Session - 05/28/15 1025    Visit Number 21   Number of Visits 25   Date for PT Re-Evaluation 06/08/15   Authorization Type MCR trad primary, BCBS secondary    PT Start Time 1018   PT Stop Time 1100   PT Time Calculation (min) 42 min   Equipment Utilized During Treatment Gait belt   Activity Tolerance Patient tolerated treatment well   Behavior During Therapy WFL for tasks assessed/performed      Past Medical History  Diagnosis Date  . Bradycardia   . Chronic abdominal pain   . Hesitancy of micturition     Past Surgical History  Procedure Laterality Date  . Anterior cervical decomp/discectomy fusion N/A 11/14/2014    Procedure: Cervical Five-Cervical Six  Anterior Cervical Decompression/Diskectomy/Fusion;  Surgeon: Erline Levine, MD;  Location: [facility];  Service: Neurosurgery;  Laterality: N/A;    There were no vitals filed for this visit.  Visit Diagnosis:  Abnormality of gait  Generalized weakness  Left-sided weakness  Decreased strength of trunk and back  Unsteadiness      Subjective Assessment - 05/28/15 1024    Subjective "I think I'm getting a little burned out."    Patient is accompained by: Family member   Limitations Walking;Standing;House hold activities;Sitting;Lifting   Patient Stated Goals "to walk without this walker"   Currently in Pain? Yes   Pain Score 4    Pain Location Generalized   Pain Orientation Left   Pain Descriptors / Indicators Throbbing   Pain Type Chronic pain   Pain Onset More than a month ago   Pain Frequency Constant          NMR: Began to look at LTG's and therefore  performed DGI.  Note score of 15/24, which is same score from approx one month ago, indicative of continued fall risk and need for AD for safety.  See full details below.  Educated pt on meaning and recommendation to continue to use [facility] for all mobility to prevent fall.  Also assessed gait speed for LTG.  Note that he did not meet original goal but has made marked improvement since last tested.  Progressed during session to address LUE/LE NMR with increased WB and weight shift.  Performed partial sit<>stands with LUE WB and cues/faciltation for increased L lateral weight shift while placing rings onto cones.  Tolerated well, therefore increased L lateral reach.  Progressed to sit<>stand with RLE placed on balance beam x 10 reps>RLE on large foam pad x 10 reps all without UE support to increase WB and strengthening in LLE.  Tolerated well during session with cues for upright posture and increased forward weight shift at hips.                 [facility] Adult PT Treatment/Exercise - 05/28/15 1035    Dynamic Gait Index   Level Surface Mild Impairment   Change in Gait Speed Moderate Impairment   Gait with Horizontal Head Turns Mild Impairment   Gait with Vertical Head Turns Mild Impairment   Gait and Pivot Turn Mild Impairment   Step Over Obstacle Mild Impairment   Step Around Obstacles Mild Impairment  Steps Mild Impairment   Total Score 15                PT Education - 05/28/15 1024    Education provided Yes   Education Details goals and D/C on next visit   Person(s) Educated Patient   Methods Explanation   Comprehension Verbalized understanding          PT Short Term Goals - 05/21/15 1045    PT SHORT TERM GOAL #1   Title Pt will initiate HEP for balance and strengthening in order to indicate decreased fall risk and improved functional mobility.  (Target Date: 04/05/15)   Baseline met 04/10/15   Status Achieved   PT SHORT TERM GOAL #2   Title Pt will increase gait speed to  2.03 ft/ sec in order to indicate decreased fall risk and improved efficiency of gait.     Baseline 1.68 ft/sec on 04/10/15   Status Partially Met   PT SHORT TERM GOAL #3   Title Pt will increase BERG balance test to 42/56 in order to indicate decreased fall risk and improved functional balance.    Baseline 50/56 on 04/10/15   Status Achieved   PT SHORT TERM GOAL #4   Title Pt will ambulate >150' on indoor surfaces w/ SPC at mod I level in order to indicate safe home negotiation.     Baseline 230' with SPC at mod I level on 04/10/15   Status Achieved   PT SHORT TERM GOAL #5   Title Pt will perform flight of stairs w/ single handrail in alternating pattern at mod I level in order to indicate safe negotiation in home.     Baseline met 04/10/15   Status Achieved   PT SHORT TERM GOAL #6   Title Pt will verbalize fall prevention strategies independently in order to better decrease risk of falling at home.     Baseline Pt needs mod cues to recall    Status Partially Met           PT Long Term Goals - 05/28/15 1025    PT LONG TERM GOAL #1   Title Pt will be independent with HEP for strengthening and balance in order to indicate improved functional mobility and decreased balance.  (Target Date: 05/02/14)   Baseline 05/09/15- met with program issued to date   PT [place] #2   Title Pt will increase gait speed to 2.63 ft/sec in order to indicate decreased fall risk and and indicate pt is safe community ambulator.  (Modified Target Date: 06/06/15)   Baseline 05/09/15: 1.35 ft/sec with straight cane; (1.50 ft/sec with straight cane on 05/03/15) 2.05 on 05/28/15   Status Partially Met   PT LONG TERM GOAL #3   Title Pt will improve BERG balance test score to 46/56 in order to indicate decreased fall risk and improved functional balance.    Baseline 05/09/15: scored 48/56 today; (50/56 04/10/15), 52/56 on last visit   Status Achieved   PT LONG TERM GOAL #4   Title Pt will ambulate >500' without AD on paved  outdoor surfaces and unlevel surfaces (grass) at mod I level in order to indicate safe community and outdoor home negotiation.  (Modified Target Date: 06/06/15)   Baseline 05/09/15: requires supervision to min/min gaurd assist on complaint outdoor surfaces at times   Status Not Met   PT LONG TERM GOAL #5   Title Pt will indicate return to leisure activities in order to indicate safe return  to community functions.  (Modified Target Date: 06/06/15)   Baseline 05/28/15: Pt states he will look into [employer] fitness program   Status Not Met   PT LONG TERM GOAL #6   Title Pt will increase DGI >19/24 in order to indicate decreased fall risk. (Modified Target Date: 06/06/15)   Baseline 15/24 on 2/20 (score remained unchanged)   Status Not Met               Plan - 05/28/15 1025    Clinical Impression Statement Skilled session focused on beginning to address LTG's.  Note he has met 2/6 (partially meeting a 3rd goal) due to lack of progress.  Pt has seemed to plateau in therapy and states he feels that he is "burned out" on therapy at this time.  Pt feels ready for D/C on next visit.  educated on importance of finding community fitness program to participate in to continue to work towards fitness goals.    Pt will benefit from skilled therapeutic intervention in order to improve on the following deficits Abnormal gait;Decreased activity tolerance;Decreased balance;Decreased coordination;Decreased endurance;Decreased mobility;Decreased strength;Impaired perceived functional ability;Impaired sensation;Impaired UE functional use;Improper body mechanics;Postural dysfunction;Pain   Rehab Potential Excellent   PT Frequency 2x / week   PT Duration 8 weeks   PT Treatment/Interventions ADLs/Self Care Home Management;Electrical Stimulation;DME Instruction;Gait training;Stair training;Functional mobility training;Therapeutic exercise;Therapeutic activities;Balance training;Neuromuscular re-education;Patient/family  education;Manual techniques   PT Next Visit Plan gait outdoors with [facility] (likely will not meet that LTG, but check anyways) DC Raquel Sarna will do D/C). also Juliann Pulse will you give him silver sneakers and [facility] handout?   Consulted and Agree with Plan of Care Patient        Problem List Patient Active Problem List   Diagnosis Date Noted  . Orthostatic hypotension 11/30/2014  . C6 spinal cord injury ([place]) 11/20/2014  . Neurogenic bowel 11/20/2014  . Neurogenic bladder 11/20/2014  . Fever of unknown origin   . Quadriplegia ([place]) 11/14/2014  . C5 vertebral fracture ([place]) 11/09/2014    Cameron Sprang, PT, MPT [facility] [address] [place], [place], [address] Phone: [phone]   Fax:  [phone] 05/28/2015, 1:15 PM  Name: Carl [name] MRN: [id number] Date of Birth: July 08, 1950

## 2015-05-28 NOTE — Therapy (Signed)
[facility] [address] [place], [place], [address] Phone: [phone]   Fax:  [phone]  Occupational Therapy Treatment  Patient Details  Name: Carl [name] MRN: [id number] Date of Birth: [birth year] Referring Provider: Alger Simons  Encounter Date: 05/28/2015      OT End of Session - 05/28/15 1140    Visit Number 21   Number of Visits 23   Date for OT Re-Evaluation 06/01/15   Authorization Type MCR - G code needed   Authorization - Visit Number 21   Authorization - Number of Visits 30   OT Start Time 3570   OT Stop Time 1135   OT Time Calculation (min) 33 min   Activity Tolerance Patient tolerated treatment well      Past Medical History  Diagnosis Date  . Bradycardia   . Chronic abdominal pain   . Hesitancy of micturition     Past Surgical History  Procedure Laterality Date  . Anterior cervical decomp/discectomy fusion N/A 11/14/2014    Procedure: Cervical Five-Cervical Six  Anterior Cervical Decompression/Diskectomy/Fusion;  Surgeon: Erline Levine, MD;  Location: [facility];  Service: Neurosurgery;  Laterality: N/A;    There were no vitals filed for this visit.  Visit Diagnosis:  Decreased grip strength of left hand  Lack of coordination  Pain in left wrist  Decreased pinch strength      Subjective Assessment - 05/28/15 1107    Pertinent History see epic   Patient Stated Goals increase strength and decrease tingling   Currently in Pain? Yes   Pain Score 4    Pain Location --  generalized   Pain Orientation Left   Pain Descriptors / Indicators Throbbing   Pain Type Chronic pain   Pain Onset More than a month ago   Pain Frequency Constant   Aggravating Factors  woese in the morning   Pain Relieving Factors pain meds, activity   Effect of Pain on Daily Activities affecst ability to use my arms                      OT Treatments/Exercises (OP) - 05/28/15 0001    ADLs    ADL Comments Checked all remaining LTG's - pt met all but one goal.  See goal upgdate for details.  Pt able to carry full glass of water 300" with L hand without spilling and lift 3 pound object off high shelf with LUE (120* of shoulder flexion).  Pt able to verbalize how to advance HEP as he will d/c from OT today.                   OT Short Term Goals - 05/28/15 1136    OT SHORT TERM GOAL #1   Title Pt will increase LUE gross grip to 30 lbs to help increase overall functional use and independence of L hand - 04/05/2015   Baseline 20lbs    Status Achieved  20 pounds 04/10/2015; 30 lbs on 05/28/15   OT SHORT TERM GOAL #2   Title Pt will be educated on trunk/core strengthening exercises to increase overall posture and strength in order to safely perform ADLs   Status Achieved   OT SHORT TERM GOAL #3   Title Pt will be educated on desensitization exercises to decrease hypersensitivity to LUE    Status Achieved   OT SHORT TERM GOAL #4   Title Pt will be independent with self correcting poor tecnique and compensatory strategies  to left shoulder when functionally using LUE   Status Achieved           OT Long Term Goals - 05/28/15 1137    OT LONG TERM GOAL #1   Title Pt will increase LUE gross grip to 28 lbs to help increase overall functional use and independence of L hand - 06/05/15   Baseline 20lbs at eval. 05/01/15 = 22 lbs    Status Achieved  for renewal period   OT LONG TERM GOAL #2   Title Pt will be independent with LB ADLs   Baseline min assist    Status Achieved   OT LONG TERM GOAL #3   Title Pt will improve 9-hole peg test time to less than 35 seconds    Baseline 73 seconds. 05/01/15 = 41 sec.    Status Not Met  39.1 on 05/28/2015   OT LONG TERM GOAL #4   Title Pt will improve left wrist strength to 4/5 strength    Baseline 2+/5 strength    Status Achieved  for renewal    OT LONG TERM GOAL #5   Title Pt will improve lateral and three pinch strength by at least  5lbs   Baseline 7lbs for both lateal and three pinch. 05/01/15 = 8 lbs for both lateral and three pinch   Status Achieved  12 pounds lateral and 3 point   OT LONG TERM GOAL #6   Title Pt able to consistently retrieve/replace light weight objects from higher shelf LUE and carry cup and/or plate without drops/spills Lt hand   Status Achieved               Plan - 05/28/15 1139    Clinical Impression Statement Pt has met all STG's and all but one LTG.  Pt did not meet coordinaton goal however signficant improvement in coordination. Pt able to use LUE much more functionally at home.   Pt will benefit from skilled therapeutic intervention in order to improve on the following deficits (Retired) Decreased coordination;Decreased range of motion;Impaired flexibility;Impaired sensation;Decreased endurance;Improper spinal/pelvic alignment;Decreased activity tolerance;Impaired tone;Decreased balance;Decreased knowledge of use of DME;Impaired UE functional use;Pain;Decreased mobility;Decreased strength   Rehab Potential Good   Clinical Impairments Affecting Rehab Potential None known at this time   OT Frequency 2x / week   OT Duration 4 weeks   OT Treatment/Interventions Self-care/ADL training;Electrical Stimulation;Moist Heat;Traction;Therapeutic exercise;Neuromuscular education;Energy conservation;Functional Mobility Training;Passive range of motion;Therapeutic exercises;Therapeutic activities;Patient/family education;Balance training;Fluidtherapy   Plan d/c from [place] [place] coordination and putty HEP, desensitization techniques 03/14/15, theraband HEP 03/16/15   Consulted and Agree with Plan of Care Patient        Problem List Patient Active Problem List   Diagnosis Date Noted  . Orthostatic hypotension 11/30/2014  . C6 spinal cord injury ([place]) 11/20/2014  . Neurogenic bowel 11/20/2014  . Neurogenic bladder 11/20/2014  . Fever of unknown origin   . Quadriplegia ([place])  11/14/2014  . C5 vertebral fracture ([place]) 11/09/2014   OCCUPATIONAL THERAPY DISCHARGE SUMMARY  Visits from Start of Care: 21  Current functional level related to goals / functional outcomes: See above   Remaining deficits: Decreased coordination and strength L hand   Education / Equipment: HEP Plan: Patient agrees to discharge.  Patient goals were met. Patient is being discharged due to meeting the stated rehab goals.  ?????     Quay Burow, OTR/L 05/28/2015, 11:41 AM  [facility] [address]  [place], [place], [address] Phone: [phone]   Fax:  [phone]  Name: Carl [name] MRN: [id number] Date of Birth: 02/22/1951

## 2015-05-31 ENCOUNTER — Ambulatory Visit: Payer: Medicare Other | Admitting: Physical Therapy

## 2015-05-31 ENCOUNTER — Encounter: Payer: Self-pay | Admitting: Physical Therapy

## 2015-05-31 ENCOUNTER — Encounter: Payer: Self-pay | Admitting: Occupational Therapy

## 2015-05-31 DIAGNOSIS — R531 Weakness: Secondary | ICD-10-CM

## 2015-05-31 DIAGNOSIS — R269 Unspecified abnormalities of gait and mobility: Secondary | ICD-10-CM

## 2015-05-31 DIAGNOSIS — R2681 Unsteadiness on feet: Secondary | ICD-10-CM

## 2015-05-31 DIAGNOSIS — R6889 Other general symptoms and signs: Secondary | ICD-10-CM

## 2015-05-31 DIAGNOSIS — R29898 Other symptoms and signs involving the musculoskeletal system: Secondary | ICD-10-CM

## 2015-06-03 NOTE — Therapy (Signed)
[facility] [address] [place], [place], [address] Phone: [phone]   Fax:  [phone]  Physical Therapy Treatment and D/C Summary  Patient Details  Name: Carl [name] MRN: [id number] Date of Birth: [birth year] Referring Provider: Alger Simons, MD  Encounter Date: 05/31/2015   05/31/15 1102  PT Visits / Re-Eval  Visit Number 22  Number of Visits 25  Date for PT Re-Evaluation 06/08/15  Authorization  Authorization Type MCR trad primary, BCBS secondary   PT Time Calculation  PT Start Time 1100  PT Stop Time 1126 (short session due to discharge)  PT Time Calculation (min) 26 min  PT - End of Session  Equipment Utilized During Treatment Gait belt  Activity Tolerance Patient tolerated treatment well  Behavior During Therapy [facility] for tasks assessed/performed      Past Medical History  Diagnosis Date  . Bradycardia   . Chronic abdominal pain   . Hesitancy of micturition     Past Surgical History  Procedure Laterality Date  . Anterior cervical decomp/discectomy fusion N/A 11/14/2014    Procedure: Cervical Five-Cervical Six  Anterior Cervical Decompression/Diskectomy/Fusion;  Surgeon: Erline Levine, MD;  Location: [facility];  Service: Neurosurgery;  Laterality: N/A;    There were no vitals filed for this visit.  Visit Diagnosis:  Abnormality of gait  Generalized weakness  Left-sided weakness  Decreased strength of trunk and back  Unsteadiness  Decreased functional activity tolerance     05/31/15 1101  Symptoms/Limitations  Subjective No new complaints. No falls to report.  Limitations Walking;Standing;House hold activities;Sitting;Lifting  Patient Stated Goals "to walk without this walker"  Pain Assessment  Currently in Pain? Yes  Pain Score 2  Pain Location Arm  Pain Orientation Left  Pain Descriptors / Indicators Numbness  Pain Type Chronic pain  Pain Onset More than a month ago  Pain  Frequency Intermittent  Aggravating Factors  worse in mornings  Pain Relieving Factors medication, activity     Treatment: Gait: >/= 600 feet with straight cane on level indoor and unlevel outdoor paved/grass/gravel surfaces. Mod I on level surfaces and supervision to Mod I on unlevel surfaces.    Self care: Verbally reviewed current HEP to date with visual handouts. Pt without any questions. Pt given information for [facility] and [place] for continued community fitness options.      05/31/15 2152  PT Education  Education provided Yes  Education Details community fitness options  Person(s) Educated Patient  Methods Explanation;Demonstration;Handout  Comprehension Verbalized understanding;Returned demonstration          PT Short Term Goals - 05/21/15 1045    PT SHORT TERM GOAL #1   Title Pt will initiate HEP for balance and strengthening in order to indicate decreased fall risk and improved functional mobility.  (Target Date: 04/05/15)   Baseline met 04/10/15   Status Achieved   PT SHORT TERM GOAL #2   Title Pt will increase gait speed to 2.03 ft/ sec in order to indicate decreased fall risk and improved efficiency of gait.     Baseline 1.68 ft/sec on 04/10/15   Status Partially Met   PT SHORT TERM GOAL #3   Title Pt will increase BERG balance test to 42/56 in order to indicate decreased fall risk and improved functional balance.    Baseline 50/56 on 04/10/15   Status Achieved   PT SHORT TERM GOAL #4   Title Pt will ambulate >150' on indoor surfaces w/ SPC at [facility]  I level in order to indicate safe home negotiation.     Baseline 230' with SPC at mod I level on 04/10/15   Status Achieved   PT SHORT TERM GOAL #5   Title Pt will perform flight of stairs w/ single handrail in alternating pattern at mod I level in order to indicate safe negotiation in home.     Baseline met 04/10/15   Status Achieved   PT SHORT TERM GOAL #6   Title Pt will verbalize fall prevention  strategies independently in order to better decrease risk of falling at home.     Baseline Pt needs mod cues to recall    Status Partially Met           PT Long Term Goals - 05/31/15 1103    PT LONG TERM GOAL #1   Title Pt will be independent with HEP for strengthening and balance in order to indicate improved functional mobility and decreased balance.  (Target Date: 05/02/14)   Baseline 05/31/15: met with HEP issued to date   Status Achieved   PT LONG TERM GOAL #2   Title Pt will increase gait speed to 2.63 ft/sec in order to indicate decreased fall risk and and indicate pt is safe community ambulator.  (Modified Target Date: 06/06/15)   Baseline 05/09/15: 1.35 ft/sec with straight cane; (1.50 ft/sec with straight cane on 05/03/15) 2.05 on 05/28/15   Status Partially Met   PT LONG TERM GOAL #3   Title Pt will improve BERG balance test score to 46/56 in order to indicate decreased fall risk and improved functional balance.    Baseline 05/09/15: scored 48/56 today; (50/56 04/10/15), 52/56 on last visit   Status Achieved   PT LONG TERM GOAL #4   Title Pt will ambulate >500' without AD on paved outdoor surfaces and unlevel surfaces (grass) at mod I level in order to indicate safe community and outdoor home negotiation.  (Modified Target Date: 06/06/15)   Baseline 05/31/15: mod I with cane on paved surfaces, min guard to supervision on grass, gravel, outdoor compliant surfaces with cane.   Status Partially Met   PT LONG TERM GOAL #5   Title Pt will indicate return to leisure activities in order to indicate safe return to community functions.  (Modified Target Date: 06/06/15)   Baseline 05/28/15: Pt states he will look into [employer] fitness program   Status Not Met   PT LONG TERM GOAL #6   Title Pt will increase DGI >19/24 in order to indicate decreased fall risk. (Modified Target Date: 06/06/15)   Baseline 15/24 on 2/20 (score remained unchanged)   Status Not Met      05/31/15 1102  Plan  Clinical  Impression Statement Remaining LTGs checked with pt partially meeting his gait goal. Pt also verbalized plans to return to community fitness, given information on [employer] and [place]. Pt agreeable to discharge today.  Pt will benefit from skilled therapeutic intervention in order to improve on the following deficits Abnormal gait;Decreased activity tolerance;Decreased balance;Decreased coordination;Decreased endurance;Decreased mobility;Decreased strength;Impaired perceived functional ability;Impaired sensation;Impaired UE functional use;Improper body mechanics;Postural dysfunction;Pain  Rehab Potential Excellent  PT Frequency 2x / week  PT Duration 8 weeks  PT Treatment/Interventions ADLs/Self Care Home Management;Electrical Stimulation;DME Instruction;Gait training;Stair training;Functional mobility training;Therapeutic exercise;Therapeutic activities;Balance training;Neuromuscular re-education;Patient/family education;Manual techniques  PT Next Visit Plan discharge per PT plan of care  Consulted and Agree with Plan of Care Patient  G-Codes - 06/04/15 0911    Functional Assessment Tool Used berg balance test 52/56   Functional Limitation Mobility: Walking and moving around   Mobility: Walking and Moving Around Current Status [phone]) At least 1 percent but less than 20 percent impaired, limited or restricted   Mobility: Walking and Moving Around Goal Status [phone]) At least 1 percent but less than 20 percent impaired, limited or restricted   Mobility: Walking and Moving Around Discharge Status [phone]) At least 1 percent but less than 20 percent impaired, limited or restricted      PHYSICAL THERAPY DISCHARGE SUMMARY  Visits from Start of Care: 22  Current functional level related to goals / functional outcomes: See LTG's   Remaining deficits: Continues to have balance and LLE strength deficits.   Has been given extensive HEP to  address these at home.     Education / Equipment: HEP, education on returning to community fitness  Plan: Patient agrees to discharge.  Patient goals were partially met. Patient is being discharged due to meeting the stated rehab goals.  ?????        G-code and D/C summary completed by:  Cameron Sprang, PT, MPT [facility] [address] [place], [place], [address] Phone: [phone]   Fax:  [phone] 06/04/2015, 9:13 AM     Problem List Patient Active Problem List   Diagnosis Date Noted  . Orthostatic hypotension 11/30/2014  . C6 spinal cord injury ([place]) 11/20/2014  . Neurogenic bowel 11/20/2014  . Neurogenic bladder 11/20/2014  . Fever of unknown origin   . Quadriplegia ([place]) 11/14/2014  . C5 vertebral fracture ([place]) 11/09/2014    Parcell, Betha Loa 06/04/2015, 9:12 AM  Willow Ora, PTA, [facility] [address] [place], [place] [address] [phone] 06/04/2015, 9:12 AM  Name: Carl [name] MRN: [id number] Date of Birth: 05-Oct-1950

## 2015-06-25 ENCOUNTER — Encounter: Payer: Self-pay | Admitting: Physical Medicine & Rehabilitation

## 2015-06-25 ENCOUNTER — Encounter: Payer: Medicare Other | Attending: Physical Medicine & Rehabilitation | Admitting: Physical Medicine & Rehabilitation

## 2015-06-25 VITALS — BP 91/53 | HR 54 | Resp 12

## 2015-06-25 DIAGNOSIS — S14106S Unspecified injury at C6 level of cervical spinal cord, sequela: Secondary | ICD-10-CM

## 2015-06-25 DIAGNOSIS — K592 Neurogenic bowel, not elsewhere classified: Secondary | ICD-10-CM | POA: Insufficient documentation

## 2015-06-25 DIAGNOSIS — G8929 Other chronic pain: Secondary | ICD-10-CM | POA: Diagnosis not present

## 2015-06-25 DIAGNOSIS — R3911 Hesitancy of micturition: Secondary | ICD-10-CM | POA: Insufficient documentation

## 2015-06-25 DIAGNOSIS — N319 Neuromuscular dysfunction of bladder, unspecified: Secondary | ICD-10-CM | POA: Insufficient documentation

## 2015-06-25 DIAGNOSIS — R001 Bradycardia, unspecified: Secondary | ICD-10-CM | POA: Diagnosis not present

## 2015-06-25 DIAGNOSIS — R109 Unspecified abdominal pain: Secondary | ICD-10-CM | POA: Diagnosis not present

## 2015-06-25 DIAGNOSIS — G8250 Quadriplegia, unspecified: Secondary | ICD-10-CM | POA: Insufficient documentation

## 2015-06-25 DIAGNOSIS — Z7901 Long term (current) use of anticoagulants: Secondary | ICD-10-CM | POA: Insufficient documentation

## 2015-06-25 DIAGNOSIS — S12400A Unspecified displaced fracture of fifth cervical vertebra, initial encounter for closed fracture: Secondary | ICD-10-CM | POA: Insufficient documentation

## 2015-06-25 MED ORDER — TRAMADOL HCL 50 MG PO TABS: 50.0000 mg | ORAL_TABLET | Freq: Four times a day (QID) | ORAL | Status: DC | PRN

## 2015-06-25 NOTE — Progress Notes (Signed)
Subjective:    Patient ID: Carl [name], male    DOB: 04/10/1950, 65 y.o.   MRN: [id number]  HPI   Carl [name] is here in follow up of his C6 SCI. He states things are about the same. He continues to work on his HEP. He uses his left hand splint at night. He stretches the hand during the day. He feels that the left hand/fingers are still pretty tight.    He experiences spasms in the AM on occasion. He is using his straight cane for walking. He hasn't had any falls.   His bowel and bladder have remained fairly stable.   His pain is stable. He is using tramadol for breakthrough pain.   Pain Inventory Average Pain 3 Pain Right Now 3 My pain is stabbing and tingling  In the last 24 hours, has pain interfered with the following? General activity 4 Relation with others 0 Enjoyment of life 5 What TIME of day is your pain at its worst? morning Sleep (in general) Good  Pain is worse with: bending, inactivity and some activites Pain improves with: therapy/exercise and medication Relief from Meds: 5  Mobility use a cane how many minutes can you walk? 15 ability to climb steps?  yes do you drive?  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 Neurologist .   Family History  Problem Relation Age of Onset  . Heart attack Father   . Alzheimer's disease Mother   . Aneurysm Brother     brain   Social History   Social History  . Marital Status: Married    Spouse Name: N/A  . Number of Children: N/A  . Years of Education: N/A   Occupational History  . Pastor    Social History Main Topics  . Smoking status: Former Games developermoker  . Smokeless tobacco: None  . Alcohol Use: None  . Drug Use: None  . Sexual Activity: Not Asked   Other Topics Concern  . None   Social History Narrative   Past Surgical History    Procedure Laterality Date  . Anterior cervical decomp/discectomy fusion N/A 11/14/2014    Procedure: Cervical Five-Cervical Six  Anterior Cervical Decompression/Diskectomy/Fusion;  Surgeon: Maeola HarmanJoseph Stern, MD;  Location: [facility];  Service: Neurosurgery;  Laterality: N/A;   Past Medical History  Diagnosis Date  . Bradycardia   . Chronic abdominal pain   . Hesitancy of micturition    BP 91/53 mmHg  Pulse 54  Resp 12  SpO2 97%  Opioid Risk Score:   Fall Risk Score:  `1  Depression screen PHQ 2/9  Depression screen PHQ 2/9 01/09/2015  Decreased Interest 0  Down, Depressed, Hopeless 0  PHQ - 2 Score 0  Altered sleeping 0  Tired, decreased energy 1  Change in appetite 1  Feeling bad or failure about yourself  0  Trouble concentrating 0  Moving slowly or fidgety/restless 0  Suicidal thoughts 0  PHQ-9 Score 2     Review of Systems  Constitutional:       Bowel control problems   Respiratory: Positive for shortness of breath.   Musculoskeletal: Positive for gait problem.  Neurological: Positive for weakness and numbness.       Tingling  Spasms   All other systems reviewed and are negative.      Objective:   Physical Exam  Constitutional: He is oriented to person, place, and time. He appears well-developed and well-nourished.  HENT: voice stronger Head: Normocephalic and atraumatic.  Eyes: Conjunctivae are normal. Pupils are equal, round, and reactive to light.  Neck:  Cardiovascular: bradycardia and regular rhythm.  Respiratory: Effort normal. No respiratory distress. He has no wheezes.  GI: Soft. Bowel sounds +.  Genitourinary: foley out Musculoskeletal: He exhibits no edema in either upper ext. Left axilla tender to palpation as was left AC jt. RTC impingement maneuvers equivocal.  Neurological: He is alert and oriented to person, place, and time. No cranial nerve deficit. Coordination normal.  Patient has normal sensation proximal to C6 dermatome. Reduced PP/LT  from C6 through sacral levels Patient has decreased tone in bilateral Lower extremities 4/5 bilateral deltoids, biceps, 4-triceps, 4 wrist extensors, 4/5 right HI, 3/5 left HI, right HF, KE and ADF/APF 3+ to 4-/5. 3 to 3+/5 left hip flexors, 4/5 knee extensors 3+/4- ankle dorsiflexors and plantar flexors, left weaker than right. Left HI are tight, especially lumbricals. Left deep finger flexors appear less affected.  Gait has improved. Still leans to the right with gait. Less steppage seen. DTR's 2+, ?1+ tricep on left.  . Skin: Skin is warm and dry.   Psych: still quiet but alert    Assessment/Plan:  1. Functional deficits secondary to C6 ASIA C incomplete tetraplegia, spinal cord injury due to fall with C5-C6 fracture and neurogenic bowel and bladder  -continue therapies/HEP -would like him to look at joining a gym/[employer] 2. For spasms:splinting/ HEP -consider botox to left lumbricals, ?finger flexors 100 units 3. Pain Management: gabapentin increase to  TID.  - scheduled mobic 7.5mg  daily  - tramadol prn was refilled today #120 [facility] for LUE    4. Mood: improved  5. Neurogenic bladder---I/O caths-  -continue to keep volumes 300-500cc  -urology follow up as indicated  6. Neurogenic bowel: senna in pm and fleet enema qam--more spontaneous now.  7. BP:   Follow up in 4 months or sooner if he would like to pursue botox..  Thirty minutes of face to face patient care time were spent during this visit. All questions were encouraged and answered.

## 2015-06-25 NOTE — Patient Instructions (Signed)
  PLEASE CALL ME WITH ANY PROBLEMS OR QUESTIONS (#[phone]).      

## 2015-06-28 ENCOUNTER — Telehealth: Payer: Self-pay

## 2015-06-28 NOTE — Telephone Encounter (Signed)
[name] called to let ZS know that he is interested in receiving the botox injection that was discussed during his last appointment. Can we proceed to schedule and follow though with insurance? Thanks!

## 2015-06-29 NOTE — Telephone Encounter (Signed)
Yes, please proceed. thanks

## 2015-06-29 NOTE — Telephone Encounter (Signed)
[name]-Pat-was advised that Barbara CowerJason will be in contact with the pt to schedule botox.

## 2015-07-17 ENCOUNTER — Encounter: Payer: Self-pay | Admitting: Physical Medicine & Rehabilitation

## 2015-07-17 ENCOUNTER — Encounter: Payer: Medicare Other | Attending: Physical Medicine & Rehabilitation | Admitting: Physical Medicine & Rehabilitation

## 2015-07-17 VITALS — BP 91/42 | HR 52 | Resp 14

## 2015-07-17 DIAGNOSIS — G8250 Quadriplegia, unspecified: Secondary | ICD-10-CM | POA: Diagnosis not present

## 2015-07-17 DIAGNOSIS — K592 Neurogenic bowel, not elsewhere classified: Secondary | ICD-10-CM | POA: Diagnosis not present

## 2015-07-17 DIAGNOSIS — G8929 Other chronic pain: Secondary | ICD-10-CM | POA: Diagnosis not present

## 2015-07-17 DIAGNOSIS — S14106S Unspecified injury at C6 level of cervical spinal cord, sequela: Secondary | ICD-10-CM | POA: Insufficient documentation

## 2015-07-17 DIAGNOSIS — R001 Bradycardia, unspecified: Secondary | ICD-10-CM | POA: Insufficient documentation

## 2015-07-17 DIAGNOSIS — Z7901 Long term (current) use of anticoagulants: Secondary | ICD-10-CM | POA: Diagnosis not present

## 2015-07-17 DIAGNOSIS — S12400A Unspecified displaced fracture of fifth cervical vertebra, initial encounter for closed fracture: Secondary | ICD-10-CM | POA: Diagnosis not present

## 2015-07-17 DIAGNOSIS — R3911 Hesitancy of micturition: Secondary | ICD-10-CM | POA: Diagnosis not present

## 2015-07-17 DIAGNOSIS — N319 Neuromuscular dysfunction of bladder, unspecified: Secondary | ICD-10-CM | POA: Insufficient documentation

## 2015-07-17 DIAGNOSIS — R109 Unspecified abdominal pain: Secondary | ICD-10-CM | POA: Diagnosis not present

## 2015-07-17 NOTE — Progress Notes (Signed)
Botox Injection for spasticity using needle EMG guidance Indication: G82.50     LUE today  Dilution: 100 Units/ml        Total Units Injected: 100 Indication: Severe spasticity which interferes with ADL,mobility and/or  hygiene and is unresponsive to medication management and other conservative care Informed consent was obtained after describing risks and benefits of the procedure with the patient. This includes bleeding, bruising, infection, excessive weakness, or medication side effects. A REMS form is on file and signed.  Needle: 50mm injectable monopolar needle electrode  Number of units per muscle Left wrist finger extensors (ext carp ulnaris, ext car radialis longus, extensor digitorum): 100u in 4 access locations All injections were done after obtaining appropriate EMG activity and after negative drawback for blood. The patient tolerated the procedure well. Post procedure instructions were given. A followup appointment was made.

## 2015-07-30 ENCOUNTER — Telehealth: Payer: Self-pay | Admitting: Physical Medicine & Rehabilitation

## 2015-07-30 NOTE — Telephone Encounter (Signed)
[facility] no information is needed from us, catheters were shipped on 07/16/15.

## 2015-08-08 ENCOUNTER — Other Ambulatory Visit: Payer: Self-pay | Admitting: Physical Medicine & Rehabilitation

## 2015-09-03 ENCOUNTER — Other Ambulatory Visit: Payer: Self-pay | Admitting: Physical Medicine & Rehabilitation

## 2015-10-22 ENCOUNTER — Encounter: Payer: Self-pay | Admitting: Physical Medicine & Rehabilitation

## 2015-10-22 ENCOUNTER — Encounter: Payer: Medicare Other | Attending: Physical Medicine & Rehabilitation | Admitting: Physical Medicine & Rehabilitation

## 2015-10-22 VITALS — BP 91/55 | HR 47

## 2015-10-22 DIAGNOSIS — R109 Unspecified abdominal pain: Secondary | ICD-10-CM | POA: Insufficient documentation

## 2015-10-22 DIAGNOSIS — N319 Neuromuscular dysfunction of bladder, unspecified: Secondary | ICD-10-CM | POA: Diagnosis not present

## 2015-10-22 DIAGNOSIS — G8929 Other chronic pain: Secondary | ICD-10-CM | POA: Diagnosis not present

## 2015-10-22 DIAGNOSIS — G8250 Quadriplegia, unspecified: Secondary | ICD-10-CM | POA: Insufficient documentation

## 2015-10-22 DIAGNOSIS — K592 Neurogenic bowel, not elsewhere classified: Secondary | ICD-10-CM | POA: Insufficient documentation

## 2015-10-22 DIAGNOSIS — S14106S Unspecified injury at C6 level of cervical spinal cord, sequela: Secondary | ICD-10-CM | POA: Insufficient documentation

## 2015-10-22 DIAGNOSIS — R001 Bradycardia, unspecified: Secondary | ICD-10-CM | POA: Diagnosis not present

## 2015-10-22 DIAGNOSIS — S12400A Unspecified displaced fracture of fifth cervical vertebra, initial encounter for closed fracture: Secondary | ICD-10-CM | POA: Insufficient documentation

## 2015-10-22 DIAGNOSIS — R3911 Hesitancy of micturition: Secondary | ICD-10-CM | POA: Insufficient documentation

## 2015-10-22 DIAGNOSIS — Z7901 Long term (current) use of anticoagulants: Secondary | ICD-10-CM | POA: Insufficient documentation

## 2015-10-22 MED ORDER — GABAPENTIN 300 MG PO CAPS: ORAL_CAPSULE | ORAL | Status: DC

## 2015-10-22 MED ORDER — TRAMADOL HCL 50 MG PO TABS: 50.0000 mg | ORAL_TABLET | Freq: Four times a day (QID) | ORAL | Status: DC | PRN

## 2015-10-22 NOTE — Patient Instructions (Signed)
PLEASE CALL ME WITH ANY PROBLEMS OR QUESTIONS [phone]   ?AM SUPPOSITORY?

## 2015-10-22 NOTE — Progress Notes (Signed)
Subjective:    Patient ID: Carl [name], male    DOB: 05-21-1950, 65 y.o.   MRN: [id number]  HPI   [name] is here in follow up his spastic tetraplegia. He had modest results with botox. He felt that the area was tender for awhile after we injected it. He is still walking with his cane at home. His pain levels otherwise are about the same. He hasn't had any falls. He drives short distances now.   His bowel and bladder are fairly regulated between cathing and fleet enema.   He uses tramadol and gabapentin for his neuropathic pain. They are fairly effective.   Pain Inventory Average Pain 4 Pain Right Now 4 My pain is dull and stabbing  In the last 24 hours, has pain interfered with the following? General activity 4 Relation with others 1 Enjoyment of life 6 What TIME of day is your pain at its worst? evening and night Sleep (in general) Good  Pain is worse with: some activites Pain improves with: rest and medication Relief from Meds: 4  Mobility use a cane how many minutes can you walk? 20 - 30 ability to climb steps?  yes do you drive?  yes Do you have any goals in this area?  yes  Function employed # of hrs/week . 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  no  Physicians involved in your care Any changes since last visit?  no   Family History  Problem Relation Age of Onset  . Heart attack Father   . Alzheimer's disease Mother   . Aneurysm Brother     brain   Social History   Social History  . Marital Status: Married    Spouse Name: N/A  . Number of Children: N/A  . Years of Education: N/A   Occupational History  . Pastor    Social History Main Topics  . Smoking status: Former Games developermoker  . Smokeless tobacco: None  . Alcohol Use: None  . Drug Use: None  . Sexual Activity: Not Asked   Other Topics Concern  . None    Social History Narrative   Past Surgical History  Procedure Laterality Date  . Anterior cervical decomp/discectomy fusion N/A 11/14/2014    Procedure: Cervical Five-Cervical Six  Anterior Cervical Decompression/Diskectomy/Fusion;  Surgeon: Maeola HarmanJoseph Stern, MD;  Location: [facility];  Service: Neurosurgery;  Laterality: N/A;   Past Medical History  Diagnosis Date  . Bradycardia   . Chronic abdominal pain   . Hesitancy of micturition    BP 91/55 mmHg  Pulse 47  SpO2 95%  Opioid Risk Score:   Fall Risk Score:  `1  Depression screen PHQ 2/9  Depression screen PHQ 2/9 01/09/2015  Decreased Interest 0  Down, Depressed, Hopeless 0  PHQ - 2 Score 0  Altered sleeping 0  Tired, decreased energy 1  Change in appetite 1  Feeling bad or failure about yourself  0  Trouble concentrating 0  Moving slowly or fidgety/restless 0  Suicidal thoughts 0  PHQ-9 Score 2     Review of Systems  Constitutional: Negative.   HENT: Negative.   Eyes: Negative.   Respiratory: Negative.   Cardiovascular: Negative.   Gastrointestinal: Negative.   Endocrine: Negative.   Genitourinary: Negative.   Musculoskeletal: Negative.   Skin: Negative.   Allergic/Immunologic: Negative.   Neurological: Negative.   Hematological: Negative.  Psychiatric/Behavioral: Negative.        Objective:   Physical Exam  Constitutional: He is oriented to person, place, and time. He appears well-developed and well-nourished.  HENT:  Head: Normocephalic and atraumatic.  Eyes: Conjunctivae are normal. Pupils are equal, round, and reactive to light.  Neck:    Cardiovascular: bradycardia and regular rhythm.  Respiratory: Effort normal. No respiratory distress. He has no wheezes.  GI: Soft. Bowel sounds +.  Genitourinary: foley out Musculoskeletal: He exhibits no edema in either upper ext. Left axilla tender to palpation as was left AC jt. RTC impingement maneuvers equivocal.  Neurological: He is alert and  oriented to person, place, and time. No cranial nerve deficit. Coordination normal.  Patient has normal sensation proximal to C6 dermatome. Reduced PP/LT from C6 through sacral levels Patient has decreased tone in bilateral Lower extremities 4/5 bilateral deltoids, biceps, 4-triceps, 4 wrist extensors, 4/5 right HI, 3-/5 left HI, right HF, KE and ADF/APF 3+ to 4-/5. 3 to 3+/5 left hip flexors, 4/5 knee extensors 3+/4- ankle dorsiflexors and plantar flexors, left weaker than right. No resting tone LUE  But joints tight wrist/CP's/shoulder somewhat.  Steppage gait LLE persists as he walks with a cane. Left heel cord tight. . DTR's 2+, ?1+ tricep on left.  . Skin: Skin is warm and dry.  Nail beds ?yellow  Psych: still quiet but alert    Assessment/Plan:  1. Functional deficits secondary to C6 ASIA C incomplete tetraplegia, spinal cord injury due to fall with C5-C6 fracture and neurogenic bowel and bladder  -resume outpt therapies and neuro rehab to address left hand/shoulder functionality and gait.  2. For spasms: continue baclofen but at half dose ( ) at bedtime. HEP  3. Pain Management: gabapentin increase to  TID.  - tramadol prn.  4. Mood: improved  5. Neurogenic bladder---I/O caths-  -continue to keep volumes 300-500cc   6. Neurogenic bowel: senna in pm and fleet enema qam. Consider supp?    7. BP: dc'ed florinef and bp's still low---no symptoms? Continue to monitor      Thirty minutes of face to face patient care time were spent during this visit. All questions were encouraged and answered.

## 2015-10-25 ENCOUNTER — Ambulatory Visit: Payer: Medicare Other | Attending: Physical Medicine & Rehabilitation | Admitting: Occupational Therapy

## 2015-10-25 ENCOUNTER — Ambulatory Visit: Payer: Medicare Other | Admitting: Physical Therapy

## 2015-10-25 DIAGNOSIS — R252 Cramp and spasm: Secondary | ICD-10-CM | POA: Insufficient documentation

## 2015-10-25 DIAGNOSIS — M25612 Stiffness of left shoulder, not elsewhere classified: Secondary | ICD-10-CM | POA: Insufficient documentation

## 2015-10-25 DIAGNOSIS — M6281 Muscle weakness (generalized): Secondary | ICD-10-CM | POA: Insufficient documentation

## 2015-10-25 DIAGNOSIS — R2689 Other abnormalities of gait and mobility: Secondary | ICD-10-CM | POA: Diagnosis present

## 2015-10-25 DIAGNOSIS — R29818 Other symptoms and signs involving the nervous system: Secondary | ICD-10-CM | POA: Diagnosis present

## 2015-10-25 DIAGNOSIS — R29898 Other symptoms and signs involving the musculoskeletal system: Secondary | ICD-10-CM | POA: Insufficient documentation

## 2015-10-25 DIAGNOSIS — M79602 Pain in left arm: Secondary | ICD-10-CM | POA: Diagnosis present

## 2015-10-25 DIAGNOSIS — R278 Other lack of coordination: Secondary | ICD-10-CM | POA: Insufficient documentation

## 2015-10-25 DIAGNOSIS — M25642 Stiffness of left hand, not elsewhere classified: Secondary | ICD-10-CM | POA: Insufficient documentation

## 2015-10-25 NOTE — Therapy (Signed)
[facility] [address] [place], [place], [address] Phone: [phone]   Fax:  [phone]  Occupational Therapy Treatment  Patient Details  Name: Carl [name] MRN: [id number] Date of Birth: [birth year] Referring Provider: Dr. Riley Kill  Encounter Date: 10/25/2015      OT End of Session - 10/25/15 1557    Visit Number 1   Number of Visits 17   Date for OT Re-Evaluation 12/26/15   Authorization Type Medicare   Authorization - Visit Number 1   Authorization - Number of Visits 10   OT Start Time 1317   OT Stop Time 1353   OT Time Calculation (min) 36 min   Activity Tolerance Patient tolerated treatment well   Behavior During Therapy [facility] for tasks assessed/performed      Past Medical History  Diagnosis Date  . Bradycardia   . Chronic abdominal pain   . Hesitancy of micturition     Past Surgical History  Procedure Laterality Date  . Anterior cervical decomp/discectomy fusion N/A 11/14/2014    Procedure: Cervical Five-Cervical Six  Anterior Cervical Decompression/Diskectomy/Fusion;  Surgeon: Maeola Harman, MD;  Location: [facility];  Service: Neurosurgery;  Laterality: N/A;    There were no vitals filed for this visit.      Subjective Assessment - 10/25/15 1324    Subjective  Pt reports LUE stiffness, weakness and coordination difficulties after incomplete spinal cord injury   Pertinent History see Epic   Patient Stated Goals increase strength and use of LUe   Currently in Pain? Yes   Pain Score 4    Pain Location Arm   Pain Orientation Left   Pain Descriptors / Indicators Throbbing   Pain Type Chronic pain   Pain Onset More than a month ago   Pain Frequency Constant   Aggravating Factors  reaching, weightbearing   Pain Relieving Factors medicine   Multiple Pain Sites No            [facility] OT Assessment - 10/25/15 0001    Assessment   Diagnosis C6 incoplete spastic tetraplegia s/p fall with C5-C6  fx   Referring Provider Dr. Riley Kill   Onset Date 10/22/15   Prior Therapy OT,PT   Precautions   Precautions Fall   Precaution Comments sensory impairment   Home  Environment   Family/patient expects to be discharged to: Private residence   Lives With Spouse   Prior Function   Level of Independence Independent with basic ADLs   Vocation Retired   ADL   Eating/Feeding Modified independent   Grooming Modified independent   Upper Body Bathing Modified independent   Lower Body Bathing Modified independent   Upper Body Dressing Independent   Lower Body Dressing Independent   Toilet Tranfer Moderate assistance  needs assist with catheterization   Tub/Shower Transfer Modified independent  walk in shower   IADL   Shopping Shops independently for small purchases   Light Housekeeping --  wife does most of house work   Meal Prep Able to complete simple warm meal prep   Mobility   Mobility Status --  uses cane modified independently   Written Expression   Dominant Hand Right   Handwriting 100% legible   Cognition   Overall Cognitive Status Within Functional Limits for tasks assessed   Sensation   Light Touch Not tested  per MD note reduced sensation below C6 dermatome   Coordination   Gross Motor Movements are Fluid and Coordinated No   Fine Motor  Movements are Fluid and Coordinated No   9 Hole Peg Test Right;Left   Right 9 Hole Peg Test 26.75 secs   Left 9 Hole Peg Test 56.38   Box and Blocks RUE 52 blocks, LUE 44 blocks   Coordination impaired for LUE   ROM / Strength   AROM / PROM / Strength AROM;Strength   AROM   Overall AROM  Deficits   Overall AROM Comments RUE is grossly WFLS   AROM Assessment Site Shoulder;Elbow;Forearm;Wrist;Finger   Right/Left Shoulder Left   Left Shoulder Flexion 100 Degrees   Left Shoulder ABduction 90 Degrees   Right/Left Elbow Left   Left Elbow Flexion --  WFLS   Left Elbow Extension --  WFLS   Right/Left Forearm Left   Left Forearm  Pronation 55 Degrees   Left Forearm Supination 80 Degrees   Right/Left Wrist Left   Left Wrist Extension 65 Degrees   Left Wrist Flexion 35 Degrees   Right/Left Finger Left   Left Composite Finger Extension --  90%   Left Composite Finger Flexion --  WFLS   Strength   Overall Strength Deficits   Overall Strength Comments RUE grossly 4+/% LUE grossly 4-/5 to 4/5   Hand Function   Right Hand Gross Grasp Functional   Right Hand Grip (lbs) 65 lbs   Left Hand Gross Grasp Impaired   Left Hand Grip (lbs) 38 lbs                            OT Short Term Goals - 10/25/15 1614    OT SHORT TERM GOAL #1   Title Pt will verbalize understanding of LUE positioning to minimize pain and risk for contracture   Time 4   Period Weeks   Status New   OT SHORT TERM GOAL #2   Title Pt will be independent with updated HEP    Time 4   Period Weeks   Status New   OT SHORT TERM GOAL #3   Title Pt will demonstrate improved LUE shoulder flexion as evidenced by retrieving a lightweight object at 110 shoulder flexion with pain no greater than 3/10.   Time 4   Period Weeks   Status New   OT SHORT TERM GOAL #4   Title Pt will demonstrate at least 95% composite finger extension for LUE.   Time 4   Period Weeks   Status New           OT Long Term Goals - 10/25/15 1618    OT LONG TERM GOAL #1   Title Pt will improve LUE fine motor coordination as evidenced by decreasing 9 hole peg test score to 45 secs or less   Baseline 56.38 secs   Time 8   Status New   OT LONG TERM GOAL #2   Title Pt will increase LUE forearm pronation to 65* for increased LUE functional use.   Time 8   Period Weeks   Status New   OT LONG TERM GOAL #3   Title Pt will increase left wrist flexion to 45* for increased functional use.   Time 8   Period Weeks   Status New   OT LONG TERM GOAL #4   Title Pt will use LUE as a non dominant assist for ADLs/IADLs at least 50% of the time with pain no greater  than 3/10   Time 8   Period Weeks   Status New  Plan - 11/11/15 1558    Clinical Impression Statement Pt s/p fall and subsequent C6 incomplete tetraplegia on November 09, 2014 returns to occupational therapy after recent botox injections. Pt previously received outpatient occupational therapy at this site. Pt can benefit from continued skilled occupational therapy to address RUE weakness, spasticity, pain and deereased coordination which impedes performance of ADLs/ IADLs.   Rehab Potential Good   OT Frequency 2x / week  plus eval   OT Duration 8 weeks   OT Treatment/Interventions Self-care/ADL training;Therapeutic exercise;Patient/family education;Balance training;Splinting;Manual Therapy;Neuromuscular education;Ultrasound;Energy conservation;Therapeutic exercises;Therapeutic activities;DME and/or AE instruction;Parrafin;Cryotherapy;Electrical Stimulation;Fluidtherapy;Passive range of motion;Contrast Bath;Moist Heat   Plan inital HEP, assess night splint   Consulted and Agree with Plan of Care Patient      Patient will benefit from skilled therapeutic intervention in order to improve the following deficits and impairments:  Abnormal gait, Decreased coordination, Decreased range of motion, Difficulty walking, Impaired flexibility, Impaired sensation, Decreased safety awareness, Decreased endurance, Decreased activity tolerance, Decreased knowledge of precautions, Impaired tone, Pain, Impaired UE functional use, Decreased knowledge of use of DME, Decreased balance, Decreased mobility, Decreased strength  Visit Diagnosis: Stiffness of left shoulder, not elsewhere classified  Pain In Left Arm  Other lack of coordination  Muscle weakness (generalized)  Other symptoms and signs involving the musculoskeletal system  Stiffness of left hand, not elsewhere classified      G-Codes - 2015/11/11 1603    Functional Assessment Tool Used 9 hole peg test RUE 26.75 secs, LUE 56.38  secs,    Functional Limitation Carrying, moving and handling objects   Carrying, Moving and Handling Objects Current Status [id number]) At least 20 percent but less than 40 percent impaired, limited or restricted   Carrying, Moving and Handling Objects Goal Status (X9147) At least 1 percent but less than 20 percent impaired, limited or restricted      Problem List Patient Active Problem List   Diagnosis Date Noted  . Orthostatic hypotension 11/30/2014  . C6 spinal cord injury (HCC) 11/20/2014  . Neurogenic bowel 11/20/2014  . Neurogenic bladder 11/20/2014  . Fever of unknown origin   . 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 C5 vertebral fracture (HCC) 11/09/2014    Carl [name] 11/11/2015, 4:27 PM Keene Breath, OTR/L [phone] Phone: [phone] 4:27 PM 11-11-2015   [facility] [address] [place], [place], [address] Phone: [phone]   Fax:  [phone]  Name: Carl [name] MRN: [id number] Date of Birth: 13-Sep-1950

## 2015-10-25 NOTE — Patient Instructions (Signed)
Heel Cord Stretch    Place one leg forward, bent, other leg behind and straight. Lean forward keeping back heel flat. Hold _60___ seconds while counting out loud. Repeat with other leg. Repeat _1__ times. Do _3-4Gastroc / Heel Cord Stretch - On Step    Stand with heels over edge of stair. Holding rail, lower heels until stretch is felt in calf of legs. Repeat _1__ times. Do _3-5__ times per day.   HOLD 30-60 secs - BOTH FEETHamstring Stretch (Sitting)    Sitting, extend one leg and place hands on same thigh for support. Keeping torso straight, lean forward, sliding hands down leg, until a stretch is felt in back of thigh. Hold _30-60_ seconds. Repeat with other leg.  HOLD 30-60 secs - do in chair with back support  Copyright  VHI. All rights reserved.    Copyright  VHI. All rights reserved.  __3-4 sessions per day.  http://gt2.exer.us/511   Copyright  VHI. All rights reserved.

## 2015-10-25 NOTE — Therapy (Signed)
[facility] [address] [place], [place], [address] Phone: [phone]   Fax:  [phone]  Physical Therapy Evaluation  Patient Details  Name: Carl [name] MRN: [id number] Date of Birth: 1951/04/02 Referring Provider: Dr. Faith RogueZachary Swartz  Encounter Date: 10/25/2015    Past Medical History  Diagnosis Date  . Bradycardia   . Chronic abdominal pain   . Hesitancy of micturition     Past Surgical History  Procedure Laterality Date  . Anterior cervical decomp/discectomy fusion N/A 11/14/2014    Procedure: Cervical Five-Cervical Six  Anterior Cervical Decompression/Diskectomy/Fusion;  Surgeon: Maeola HarmanJoseph Stern, MD;  Location: [facility];  Service: Neurosurgery;  Laterality: N/A;    There were no vitals filed for this visit.       Subjective Assessment - 10/25/15 1402    Subjective Pt reports sustaining SCI C5-C6 on 11-09-15 after falling off of a roof;  had PT at this facility from Dec. 2016 until Feb. 2017; pt using a single point for assistance with communiyt amb. and household amb. inconsistenly   Patient Stated Goals Increase LLE strength; decr. tightness in LLE   Currently in Pain? Yes  numbness in both feet   Pain Score 4    Pain Location Arm   Pain Orientation Left   Pain Descriptors / Indicators Throbbing   Pain Type Chronic pain   Pain Onset More than a month ago   Pain Frequency Constant            [facility] PT Assessment - 10/25/15 1408    Assessment   Medical Diagnosis Spastic tetraplegia due to C5-6 SCI   Referring Provider Dr. Faith RogueZachary Swartz   Onset Date/Surgical Date 11/09/14   Balance Screen   Has the patient fallen in the past 6 months No   Has the patient had a decrease in activity level because of a fear of falling?  No   Is the patient reluctant to leave their home because of a fear of falling?  No   Home Environment   Living Environment Private residence   Type of Home House   Home Access  Ramped entrance   Home Layout Two level;Able to live on main level with bedroom/bathroom   Home Equipment Walker - 2 wheels;Shower seat;Bedside commode;Wheelchair - manual;Wheelchair - power                  R SLS 15 secs;  L SLS 2.53   Feet together EC 30 secs  Tandem - L foot in front- 30 secs   L DF = 4/5  L hip flexion 4/5   L plantarflexion 3-/5    L hip abduction 3-/5   L knee flexion 3-/5;  Hip ext 3-/5   L hip IR/ER 4+/5  Patient will benefit from skilled therapeutic intervention in order to improve the following deficits and impairments:     Visit Diagnosis: No diagnosis found.     Problem List Patient Active Problem List   Diagnosis Date Noted  . Orthostatic hypotension 11/30/2014  . C6 spinal cord injury (HCC) 11/20/2014  . Neurogenic bowel 11/20/2014  . Neurogenic bladder 11/20/2014  . Fever of unknown origin   . 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 C5 vertebral fracture (HCC) 11/09/2014    Waldine Zenz, Donavan BurnetLinda Suzanne, PT 10/25/2015, 2:11 PM  [facility] [address] [place], [place], [address] Phone: [phone]   Fax:  [phone]  Name: Carl [name] MRN: [id number] Date of Birth:  02/04/1951   

## 2015-10-28 NOTE — Therapy (Signed)
[facility] [address] [place], [place], [address] Phone: [phone]   Fax:  [phone]  Physical Therapy Evaluation  Patient Details  Name: Carl [name] MRN: [id number] Date of Birth: [birth year] Referring Provider: Dr. Faith Rogue  Encounter Date: 10/25/2015      PT End of Session - 10/28/15 1558    Visit Number 1   Number of Visits 17   Date for PT Re-Evaluation 12/24/15   Authorization Type Medicare   Authorization Time Period 10-25-15 - 12-24-15   PT Start Time 1403   PT Stop Time 1445   PT Time Calculation (min) 42 min      Past Medical History:  Diagnosis Date  . Bradycardia   . Chronic abdominal pain   . Hesitancy of micturition     Past Surgical History:  Procedure Laterality Date  . ANTERIOR CERVICAL DECOMP/DISCECTOMY FUSION N/A 11/14/2014   Procedure: Cervical Five-Cervical Six  Anterior Cervical Decompression/Diskectomy/Fusion;  Surgeon: Maeola Harman, MD;  Location: [facility];  Service: Neurosurgery;  Laterality: N/A;    There were no vitals filed for this visit.       Subjective Assessment - 10/28/15 1557    Subjective Pt reports sustaining SCI C5-C6 on 11-09-15 after falling off of a roof;  had PT at this facility from Dec. 2016 until Feb. 2017; pt using a single point for assistance with communiyt amb. and household amb. inconsistenly   Patient Stated Goals Increase LLE strength; decr. tightness in LLE   Currently in Pain? 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 Faith Rogue   Onset Date/Surgical Date 11/09/14   Hand Dominance Right     Precautions    Precautions Fall   Precaution Comments sensory impairment     Balance Screen   Has the patient fallen in the past 6 months No   Has the patient had a decrease in activity level because of a fear of falling?  No   Is the patient reluctant to leave their home because of a fear of falling?  No     Home Environment   Living Environment Private residence   Living Arrangements Spouse/significant other   Available Help at Discharge Family;Available 24 hours/day   Type of Home House   Home Access Ramped entrance   Home Layout Two level;Able to live on main level with bedroom/bathroom   Alternate Level Stairs-Number of Steps 15   Alternate Level Stairs-Rails Left   Home Equipment Walker - 2 wheels;Shower seat;Bedside commode;Wheelchair - Engineer, technical sales - power     Prior Function   Level of Independence Independent with basic ADLs   Vocation Retired     Naval architect Deficits   Overall Strength Comments RLE grossly 4 - 4+/5 throughout; L hip musc. 3 - 3+/5     Ambulation/Gait   Ambulation/Gait Yes   Ambulation/Gait Assistance 6: Modified independent (Device/Increase time)   Ambulation Distance (Feet) 100 Feet   Assistive device Straight cane   Gait Pattern Decreased dorsiflexion -  left;Decreased step length - left;Decreased arm swing - left;Step-through pattern   Ambulation Surface Level;Indoor   Gait velocity 2.03  16.13 secs   Stairs Yes   Stairs Assistance 5: Supervision   Stair Management Technique One rail Right   Number of Stairs 4   Height of Stairs 6     Standardized Balance Assessment   Standardized Balance Assessment Timed Up and Go Test     Timed Up and Go Test   Normal TUG (seconds) 17.78  with cane                             PT Short Term Goals - 10/28/15 1607      PT SHORT TERM GOAL #1   Title Increase gait velocity to >/= 2.40 ft/sec with cane.  11-25-15   Baseline 2.03 ft/sec   Time 4   Period Weeks   Status New      PT SHORT TERM GOAL #2   Title Improve TUG score to </= 15 secs for reduced fall risk.  11-25-15   Baseline 17.78 secs on 10-25-15 with cane   Time 4   Period Weeks   Status New     PT SHORT TERM GOAL #3   Title Amb. 100' without device without occurrence of L toes catching for incr. safety with household mobility.  11-25-15   Time 4   Period Weeks   Status New     PT SHORT TERM GOAL #4   Title Negotiate steps without hand rail step by step with SBA .  11-25-15   Time 4   Period Weeks   Status New     PT SHORT TERM GOAL #5   Title Independent in HEP for stretching and strengthening exs.  11-25-15   Time 4   Period Weeks   Status New           PT Long Term Goals - 10/28/15 1616      PT LONG TERM GOAL #1   Title Increase gait velocity to >/= 2.7 ft/sec with cane to demo increased gait efficiency.  (12-24-15)   Baseline 2.03 ft/sec with cane - 10-25-15   Time 8   Period Weeks   Status New     PT LONG TERM GOAL #2   Title Improve TUG score to </= 13.5 secs with cane for decr. fall risk.  (12-24-15)   Baseline 17.78 on 10-25-15 with cane   Time 8   Period Weeks   Status New     PT LONG TERM GOAL #3   Title Increase L SLS to >/= 4 secs to demo improved balance.  (12-24-15)   Baseline L SLS = 2.53 secs;   Time 8   Period Weeks   Status New     PT LONG TERM GOAL #4   Title Amb. at least 500' with cane on paved outdoor surfaces with minimal c/o fatigue and no LOB.  (12-24-15)   Time 8   Period Weeks   Status New               Plan - 10/28/15 1559    Clinical Impression Statement Pt is a 65 year old gentleman who sustained a C5-6 SCI after falling off of a roof in August 2016.  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  Pt received PT at this facility from Dec. 2016 until Feb. 2017 but reports he  stopped at that time because "I just needed a break".  Pt underwent ACDF on 11-14-14; PMH includes bradycardia.  Pt's spasticity impedes his  flexibility and ease with mobility. Pt has decreased L toe clearance occasionally in swing phase of gait, resulting in fall risk.   Rehab Potential Good   PT Frequency 2x / week   PT Duration 8 weeks   PT Treatment/Interventions ADLs/Self Care Home Management;Gait training;Stair training;Functional mobility training;Therapeutic activities;Patient/family education;Neuromuscular re-education;Balance training;Therapeutic exercise;Orthotic Fit/Training;Passive range of motion   PT Next Visit Plan begin HEP instruction for LE strengthening and stretching - esp hip abd and ext.   PT Home Exercise Plan see above   Consulted and Agree with Plan of Care Patient      Patient will benefit from skilled therapeutic intervention in order to improve the following deficits and impairments:  Abnormal gait, Decreased activity tolerance, Decreased balance, Decreased coordination, Decreased range of motion, Decreased mobility, Decreased strength, Impaired flexibility, Increased muscle spasms, Impaired sensation, Impaired tone, Impaired UE functional use, Pain  Visit Diagnosis: Other abnormalities of gait and mobility - Plan: PT plan of care cert/re-cert  Muscle weakness (generalized) - Plan: PT plan of care cert/re-cert  Cramp and spasm - Plan: PT plan of care cert/re-cert  Other symptoms and signs involving the nervous system - Plan: PT plan of care cert/re-cert     Problem List Patient Active Problem List   Diagnosis Date Noted  . Orthostatic hypotension 11/30/2014  . C6 spinal cord injury (HCC) 11/20/2014  . Neurogenic bowel 11/20/2014  . Neurogenic bladder 11/20/2014  . Fever of unknown origin   . 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 C5 vertebral fracture (HCC) 11/09/2014    Ahniya Mitchum, Donavan Burnet, PT 10/28/2015, 4:37 PM  [facility] [address] [place], [place], [address] Phone: [phone]   Fax:  [phone]  Name: Carl [name] MRN: [id number] Date of Birth: Aug 30, 1950

## 2015-11-19 ENCOUNTER — Ambulatory Visit: Payer: Medicare Other | Attending: Physical Medicine & Rehabilitation | Admitting: Occupational Therapy

## 2015-11-19 ENCOUNTER — Ambulatory Visit: Payer: Medicare Other | Admitting: Physical Therapy

## 2015-11-19 DIAGNOSIS — R29898 Other symptoms and signs involving the musculoskeletal system: Secondary | ICD-10-CM | POA: Diagnosis present

## 2015-11-19 DIAGNOSIS — R252 Cramp and spasm: Secondary | ICD-10-CM | POA: Diagnosis present

## 2015-11-19 DIAGNOSIS — R2689 Other abnormalities of gait and mobility: Secondary | ICD-10-CM | POA: Insufficient documentation

## 2015-11-19 DIAGNOSIS — R278 Other lack of coordination: Secondary | ICD-10-CM | POA: Diagnosis present

## 2015-11-19 DIAGNOSIS — R29818 Other symptoms and signs involving the nervous system: Secondary | ICD-10-CM | POA: Insufficient documentation

## 2015-11-19 DIAGNOSIS — M25642 Stiffness of left hand, not elsewhere classified: Secondary | ICD-10-CM | POA: Insufficient documentation

## 2015-11-19 DIAGNOSIS — M25612 Stiffness of left shoulder, not elsewhere classified: Secondary | ICD-10-CM | POA: Diagnosis present

## 2015-11-19 DIAGNOSIS — M6281 Muscle weakness (generalized): Secondary | ICD-10-CM | POA: Diagnosis present

## 2015-11-19 DIAGNOSIS — M79602 Pain in left arm: Secondary | ICD-10-CM | POA: Insufficient documentation

## 2015-11-19 NOTE — Patient Instructions (Signed)
   Lie on back holding cane with left hand on the handle with thumb facing up . Raise arms over head. Hold 5sec. Repeat 10 times per set.  Do 2-3 sessions per day.   ROM: Abduction - Wand   Holding wand with left hand palm up on handle of cane, push wand directly out to side, leading with other hand palm down, until stretch is felt. Hold 5 seconds. Repeat 10 times per set. Do 2-3 sessions per day. (Lying down)   ROM: Extension - Wand (Standing)   Stand holding wand behind back. PALMS FACING FORWARD Raise arms as far as possible KEEPING YOUR HEAD/CHEST UP. Repeat 10 times per set.  Do 2-3 sessions per day.   Press-Up With Wand   Press wand up until elbows are straight, then reach wand over head to a pain free range. Hold 5 seconds. Repeat 10 times. Do 2-3 sessions per day.     Active Assistive Shoulder External Rotation    SIT with stick at waist level, LEFT palm up, other palm down. KEEP ELBOW NEXT TO SIDE, , and keep elbows bent THEN ROTATE FORARM AWAY FROM BODY. Hold 5 SEC return to start position. Perform 10 reps. 2-3X DAY

## 2015-11-19 NOTE — Therapy (Signed)
[facility] [address] [place], [place], [address] Phone: [phone]   Fax:  [phone]  Occupational Therapy Treatment  Patient Details  Name: Carl Deedsony [name] MRN: [id number] Date of Birth: [birth year] Referring Provider: Dr. Riley KillSwartz  Encounter Date: 11/19/2015      OT End of Session - 11/19/15 1418    Visit Number 2   Number of Visits 17   Date for OT Re-Evaluation 12/26/15   Authorization Type Medicare   Authorization - Visit Number 2   Authorization - Number of Visits 10   OT Start Time 1407   OT Stop Time 1445   OT Time Calculation (min) 38 min   Activity Tolerance Patient tolerated treatment well   Behavior During Therapy [facility] for tasks assessed/performed      Past Medical History:  Diagnosis Date  . Bradycardia   . Chronic abdominal pain   . Hesitancy of micturition     Past Surgical History:  Procedure Laterality Date  . ANTERIOR CERVICAL DECOMP/DISCECTOMY FUSION N/A 11/14/2014   Procedure: Cervical Five-Cervical Six  Anterior Cervical Decompression/Diskectomy/Fusion;  Surgeon: Maeola HarmanJoseph Stern, MD;  Location: [facility];  Service: Neurosurgery;  Laterality: N/A;    There were no vitals filed for this visit.      Subjective Assessment - 11/19/15 1410    Subjective  Pt reports "just the usual pain"   Pertinent History see Epic   Patient Stated Goals increase strength and use of LUe   Currently in Pain? Yes   Pain Score 4    Pain Location Arm  shoulder/hand   Pain Orientation Left   Pain Descriptors / Indicators Throbbing   Pain Type Chronic pain   Pain Onset More than a month ago   Pain Frequency Constant   Aggravating Factors  reaching, weightbearing   Pain Relieving Factors medicine                      OT Treatments/Exercises (OP) - 11/19/15 0001      Fine Motor Coordination   Fine Motor Coordination Flipping cards;Dealing card with thumb;Stacking coins;Manipulating  coins   Flipping cards with L hand with min difficulty due to decr finger ext and supination   Dealing card with thumb with min difficulty with L hand   Manipulating coins in L hand to place in coin bank with min-mod difficulty    Stacking coins picking up and stacking coins with min cues for shoulder compensation and min-mod difficulty                OT Education - 11/19/15 1436    Education Details Cane HEP in supine   Person(s) Educated Patient   Methods Explanation;Demonstration;Verbal cues;Handout   Comprehension Returned demonstration;Verbalized understanding          OT Short Term Goals - 10/25/15 1614      OT SHORT TERM GOAL #1   Title Pt will verbalize understanding of LUE positioning to minimize pain and risk for contracture   Time 4   Period Weeks   Status New     OT SHORT TERM GOAL #2   Title Pt will be independent with updated HEP    Time 4   Period Weeks   Status New     OT SHORT TERM GOAL #3   Title Pt will demonstrate improved LUE shoulder flexion as evidenced by retrieving a lightweight object at 110 shoulder flexion with pain no greater than 3/10.   Time  4   Period Weeks   Status New     OT SHORT TERM GOAL #4   Title Pt will demonstrate at least 95% composite finger extension for LUE.   Time 4   Period Weeks   Status New           OT Long Term Goals - 10/25/15 1618      OT LONG TERM GOAL #1   Title Pt will improve LUE fine motor coordination as evidenced by decreasing 9 hole peg test score to 45 secs or less   Baseline 56.38 secs   Time 8   Status New     OT LONG TERM GOAL #2   Title Pt will increase LUE forearm pronation to 65* for increased LUE functional use.   Time 8   Period Weeks   Status New     OT LONG TERM GOAL #3   Title Pt will demonstrate improved LUE functional use as evidenced by increasing box/ blocks to 48 blocks.   Baseline LUE 44 blocks , RUE 52 blocks   Time 8   Period Weeks   Status New     OT LONG  TERM GOAL #4   Title Pt will use LUE as a non dominant assist for ADLs/IADLs at least 50% of the time with pain no greater than 3/10   Time 8   Period Weeks   Status New               Plan - 11/19/15 1437    Clinical Impression Statement Pt with demo L shoulder stiffness and pain with trunk compensation noted.     Rehab Potential Good   OT Frequency 2x / week  plus eval   OT Duration 8 weeks   OT Treatment/Interventions Self-care/ADL training;Therapeutic exercise;Patient/family education;Balance training;Splinting;Manual Therapy;Neuromuscular education;Ultrasound;Energy conservation;Therapeutic exercises;Therapeutic activities;DME and/or AE instruction;Parrafin;Cryotherapy;Electrical Stimulation;Fluidtherapy;Passive range of motion;Contrast Bath;Moist Heat   Plan review HEP, trunk/L shoulder neuro re-ed and stretching/manual therapy, assess night splint if pt brings in   Consulted and Agree with Plan of Care Patient      Patient will benefit from skilled therapeutic intervention in order to improve the following deficits and impairments:  Abnormal gait, Decreased coordination, Decreased range of motion, Difficulty walking, Impaired flexibility, Impaired sensation, Decreased safety awareness, Decreased endurance, Decreased activity tolerance, Decreased knowledge of precautions, Impaired tone, Pain, Impaired UE functional use, Decreased knowledge of use of DME, Decreased balance, Decreased mobility, Decreased strength  Visit Diagnosis: Muscle weakness (generalized)  Other lack of coordination  Other symptoms and signs involving the musculoskeletal system  Pain In Left Arm  Stiffness of left shoulder, not elsewhere classified  Stiffness of left hand, not elsewhere classified    Problem List Patient Active Problem List   Diagnosis Date Noted  . Orthostatic hypotension 11/30/2014  . C6 spinal cord injury (HCC) 11/20/2014  . Neurogenic bowel 11/20/2014  . Neurogenic  bladder 11/20/2014  . Fever of unknown origin   . 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 C5 vertebral fracture [facility](HCC) 11/09/2014    [facility] Hospital[facility]REEMAN,Jeraldin Fesler 11/19/2015, 5:46 PM  [facility] [address] [place], [place], [address] Phone: [phone]   Fax:  [phone]  Name: Carl Deedsony [name] MRN: [id number] Date of Birth: 1950/12/05   Willa FraterAngela Jaise Moser, OTR/L [facility] [address] [place], [place]  [address] [phone] phone [phone] 11/19/15 5:46 PM

## 2015-11-20 ENCOUNTER — Ambulatory Visit: Payer: Medicare Other | Admitting: Occupational Therapy

## 2015-11-20 ENCOUNTER — Encounter: Payer: Self-pay | Admitting: Physical Therapy

## 2015-11-20 ENCOUNTER — Ambulatory Visit: Payer: Medicare Other | Admitting: Physical Therapy

## 2015-11-20 DIAGNOSIS — R252 Cramp and spasm: Secondary | ICD-10-CM

## 2015-11-20 DIAGNOSIS — R278 Other lack of coordination: Secondary | ICD-10-CM

## 2015-11-20 DIAGNOSIS — R29898 Other symptoms and signs involving the musculoskeletal system: Secondary | ICD-10-CM

## 2015-11-20 DIAGNOSIS — M25612 Stiffness of left shoulder, not elsewhere classified: Secondary | ICD-10-CM

## 2015-11-20 DIAGNOSIS — M6281 Muscle weakness (generalized): Secondary | ICD-10-CM

## 2015-11-20 DIAGNOSIS — M25642 Stiffness of left hand, not elsewhere classified: Secondary | ICD-10-CM

## 2015-11-20 DIAGNOSIS — M79602 Pain in left arm: Secondary | ICD-10-CM

## 2015-11-20 NOTE — Patient Instructions (Signed)
  Coordination Activities  Perform the following activities for 20 minutes 1 times per day with left hand(s).   Flip cards 1 at a time as fast as you can.  Deal cards with your thumb (Hold deck in hand and push card off top with thumb).  Pick up coins and place in container or coin bank.  Pick up coins and stack.  Pick up coins one at a time until you get 5-10 in your hand, then move coins from palm to fingertips to stack one at a time.

## 2015-11-20 NOTE — Therapy (Signed)
[name] [address] [place], [place], [address] Phone: [phone]   Fax:  [phone]  Occupational Therapy Treatment  Patient Details  Name: Carl [name] MRN: [id number] Date of Birth: 1950-07-13 Referring Provider: Dr. Riley [name]  Encounter Date: 11/20/2015      OT End of Session - 11/20/15 1222    Visit Number 3   Number of Visits 17   Date for OT Re-Evaluation 12/26/15   Authorization Type Medicare   Authorization - Visit Number 3   Authorization - Number of Visits 10   OT Start Time 1150   OT Stop Time 1230   OT Time Calculation (min) 40 min      Past Medical History:  Diagnosis Date  . Bradycardia   . Chronic abdominal pain   . Hesitancy of micturition     Past Surgical History:  Procedure Laterality Date  . ANTERIOR CERVICAL DECOMP/DISCECTOMY FUSION N/A 11/14/2014   Procedure: Cervical Five-Cervical Six  Anterior Cervical Decompression/Diskectomy/Fusion;  Surgeon: Carl HarmanJoseph Stern, MD;  Location: [facility];  Service: Neurosurgery;  Laterality: N/A;    There were no vitals filed for this visit.      Subjective Assessment - 11/20/15 1151    Pertinent History see Epic   Patient Stated Goals increase strength and use of LUe   Currently in Pain? Yes   Pain Score 4    Pain Location Arm   Pain Orientation Left   Pain Descriptors / Indicators Aching   Pain Type Chronic pain   Pain Onset More than a month ago   Pain Frequency Constant   Aggravating Factors  cold   Pain Relieving Factors meds   Multiple Pain Sites No          Treatment: Pt was educated in coordination HEP, he returned demonstration,then review of Cane HEP, 10-15 reps, min v.c Scapular retraction in supine with therapist facilitating left shoulder Therapist assessed prefab resting hand splint, pt may benefit from a custom splint.                    OT Education - 11/20/15 1219    Education provided Yes   Education Details cane exercise review, coordination exercises   Person(s) Educated Patient   Methods Explanation;Demonstration;Verbal cues;Handout   Comprehension Verbalized understanding;Returned demonstration          [name] Term Goals - 10/25/15 1614      [name] TERM GOAL #1   Title Pt will verbalize understanding of LUE positioning to minimize pain and risk for contracture   Time 4   Period Weeks   Status New     [name] TERM GOAL #2   Title Pt will be independent with updated HEP    Time 4   Period Weeks   Status New     [name] TERM GOAL #3   Title Pt will demonstrate improved LUE shoulder flexion as evidenced by retrieving a lightweight object at 110 shoulder flexion with pain no greater than 3/10.   Time 4   Period Weeks   Status New     [name] TERM GOAL #4   Title Pt will demonstrate at least 95% composite finger extension for LUE.   Time 4   Period Weeks   Status New           OT Long Term Goals - 10/25/15 1618      OT LONG TERM GOAL #1   Title Pt will  improve LUE fine motor coordination as evidenced by decreasing 9 hole peg test score to 45 secs or less   Baseline 56.38 secs   Time 8   Status New     OT LONG TERM GOAL #2   Title Pt will increase LUE forearm pronation to 65* for increased LUE functional use.   Time 8   Period Weeks   Status New     OT LONG TERM GOAL #3   Title Pt will demonstrate improved LUE functional use as evidenced by increasing box/ blocks to 48 blocks.   Baseline LUE 44 blocks , RUE 52 blocks   Time 8   Period Weeks   Status New     OT LONG TERM GOAL #4   Title Pt will use LUE as a non dominant assist for ADLs/IADLs at least 50% of the time with pain no greater than 3/10   Time 8   Period Weeks   Status New               Plan - 11/20/15 1152    Clinical Impression Statement Pt is progressing towards goals for LUE functional use.   Rehab Potential Good   OT Frequency 2x / week   OT Duration 8  weeks   OT Treatment/Interventions Self-care/ADL training;Therapeutic exercise;Patient/family education;Balance training;Splinting;Manual Therapy;Neuromuscular education;Ultrasound;Energy conservation;Therapeutic exercises;Therapeutic activities;DME and/or AE instruction;Parrafin;Cryotherapy;Electrical Stimulation;Fluidtherapy;Passive range of motion;Contrast Bath;Moist Heat   Plan consider custom resting hand splint for improved postioning(MP ext, slight IP flex?)   Consulted and Agree with Plan of Care Patient      Patient will benefit from skilled therapeutic intervention in order to improve the following deficits and impairments:  Abnormal gait, Decreased coordination, Decreased range of motion, Difficulty walking, Impaired flexibility, Impaired sensation, Decreased safety awareness, Decreased endurance, Decreased activity tolerance, Decreased knowledge of precautions, Impaired tone, Pain, Impaired UE functional use, Decreased knowledge of use of DME, Decreased balance, Decreased mobility, Decreased strength  Visit Diagnosis: Muscle weakness (generalized)  Other lack of coordination  Other symptoms and signs involving the musculoskeletal system  Pain In Left Arm  Stiffness of left shoulder, not elsewhere classified  Stiffness of left hand, not elsewhere classified    Problem List Patient Active Problem List   Diagnosis Date Noted  . Orthostatic hypotension 11/30/2014  . C6 spinal cord injury (HCC) 11/20/2014  . Neurogenic bowel 11/20/2014  . Neurogenic bladder 11/20/2014  . Fever of unknown origin   . 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 C5 vertebral fracture (HCC) 11/09/2014    Carl [name] 11/20/2015, 1:09 PM  [facility] [address] [place], [place], [address] Phone: [phone]   Fax:  [phone]  Name: Carl [name] MRN: [id number] Date of Birth: January 26, 1951

## 2015-11-20 NOTE — Patient Instructions (Signed)
"  I love a Licensed conveyancerarade" Lift    Using a chair if necessary, march in place 4 times in each phase: (1) Foot raised 6" (2) 12" (3) 18" (4) as high as you can. Repeat __10_ times. Do __1__ sessions per day.  http://gt2.exer.us/344   Copyright  VHI. All rights reserved.

## 2015-11-21 NOTE — Therapy (Signed)
[facility] [address] [place], [place], [address] Phone: [phone]   Fax:  [phone]  Physical Therapy Treatment  Patient Details  Name: Carl [name] MRN: [id number] Date of Birth: July 21, 1950 Referring Provider: Dr. Faith Rogue  Encounter Date: 11/19/2015      PT End of Session - 11/21/15 1934    Visit Number 2  G2   Number of Visits 17   Date for PT Re-Evaluation 12/24/15   Authorization Type Medicare   Authorization Time Period 10-25-15 - 12-24-15   PT Start Time 1533   PT Stop Time 1615   PT Time Calculation (min) 42 min      Past Medical History:  Diagnosis Date  . Bradycardia   . Chronic abdominal pain   . Hesitancy of micturition     Past Surgical History:  Procedure Laterality Date  . ANTERIOR CERVICAL DECOMP/DISCECTOMY FUSION N/A 11/14/2014   Procedure: Cervical Five-Cervical Six  Anterior Cervical Decompression/Diskectomy/Fusion;  Surgeon: Maeola Harman, MD;  Location: [facility];  Service: Neurosurgery;  Laterality: N/A;    There were no vitals filed for this visit.      Subjective Assessment - 11/21/15 1928    Subjective Pt reports he gets "really stiff" when he sits for a long time   Patient Stated Goals Increase LLE strength; decr. tightness in LLE   Currently in Pain? Yes   Pain Score 4    Pain Location Arm   Pain Orientation Left   Pain Descriptors / Indicators Aching   Pain Type Chronic pain   Pain Onset More than a month ago   Pain Frequency Constant   Multiple Pain Sites No                         [facility] Adult PT Treatment/Exercise - 11/21/15 0001      Lumbar Exercises: Stretches   Active Hamstring Stretch 1 rep;30 seconds   Lower Trunk Rotation 2 reps;20 seconds   Prone on Elbows Stretch 2 reps;30 seconds     Lumbar Exercises: Standing   Heel Raises 10 reps     Lumbar Exercises: Supine   Clam 10 reps   Bridge 10 reps   Straight Leg Raise 10  reps     Knee/Hip Exercises: Stretches   Quad Stretch 2 reps;20 seconds  in prone   Gastroc Stretch Both;1 rep;30 seconds     Knee/Hip Exercises: Aerobic   Recumbent Bike SciFit level 1.5 x 5" with UE and LE's     Knee/Hip Exercises: Seated   Hamstring Curl Both;1 set;10 reps  with contract/relax for quad strengthening     Knee/Hip Exercises: Supine   Other Supine Knee/Hip Exercises Bridging with hip abdct/adduction; with marching: with LE extension x 5 reps each                  PT Short Term Goals - 10/28/15 1607      PT SHORT TERM GOAL #1   Title Increase gait velocity to >/= 2.40 ft/sec with cane.  11-25-15   Baseline 2.03 ft/sec   Time 4   Period Weeks   Status New     PT SHORT TERM GOAL #2   Title Improve TUG score to </= 15 secs for reduced fall risk.  11-25-15   Baseline 17.78 secs on 10-25-15 with cane   Time 4   Period Weeks   Status New     PT SHORT TERM GOAL #  3   Title Amb. 100' without device without occurrence of L toes catching for incr. safety with household mobility.  11-25-15   Time 4   Period Weeks   Status New     PT SHORT TERM GOAL #4   Title Negotiate steps without hand rail step by step with SBA .  11-25-15   Time 4   Period Weeks   Status New     PT SHORT TERM GOAL #5   Title Independent in HEP for stretching and strengthening exs.  11-25-15   Time 4   Period Weeks   Status New           PT Long Term Goals - 10/28/15 1616      PT LONG TERM GOAL #1   Title Increase gait velocity to >/= 2.7 ft/sec with cane to demo increased gait efficiency.  (12-24-15)   Baseline 2.03 ft/sec with cane - 10-25-15   Time 8   Period Weeks   Status New     PT LONG TERM GOAL #2   Title Improve TUG score to </= 13.5 secs with cane for decr. fall risk.  (12-24-15)   Baseline 17.78 on 10-25-15 with cane   Time 8   Period Weeks   Status New     PT LONG TERM GOAL #3   Title Increase L SLS to >/= 4 secs to demo improved balance.  (12-24-15)    Baseline L SLS = 2.53 secs;   Time 8   Period Weeks   Status New     PT LONG TERM GOAL #4   Title Amb. at least 500' with cane on paved outdoor surfaces with minimal c/o fatigue and no LOB.  (12-24-15)   Time 8   Period Weeks   Status New               Plan - 11/21/15 1935    Clinical Impression Statement Pt has increased spasticity in trunk and LE's with LLE weaker than RLE:  tightness impacts flexibility and decreased L toe clearance noted in swing phase of gait   Rehab Potential Good   PT Frequency 2x / week   PT Duration 8 weeks   PT Treatment/Interventions ADLs/Self Care Home Management;Gait training;Stair training;Functional mobility training;Therapeutic activities;Patient/family education;Neuromuscular re-education;Balance training;Therapeutic exercise;Orthotic Fit/Training;Passive range of motion   PT Next Visit Plan begin HEP instruction for LE strengthening and stretching - esp hip abd and ext.   PT Home Exercise Plan see above   Consulted and Agree with Plan of Care Patient      Patient will benefit from skilled therapeutic intervention in order to improve the following deficits and impairments:  Abnormal gait, Decreased activity tolerance, Decreased balance, Decreased coordination, Decreased range of motion, Decreased mobility, Decreased strength, Impaired flexibility, Increased muscle spasms, Impaired sensation, Impaired tone, Impaired UE functional use, Pain  Visit Diagnosis: Muscle weakness (generalized)  Other abnormalities of gait and mobility     Problem List Patient Active Problem List   Diagnosis Date Noted  . Orthostatic hypotension 11/30/2014  . C6 spinal cord injury (HCC) 11/20/2014  . Neurogenic bowel 11/20/2014  . Neurogenic bladder 11/20/2014  . Fever of unknown origin   . 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 C5 vertebral fracture (HCC) 11/09/2014    Chanse Kagel, Donavan BurnetLinda Suzanne, PT 11/21/2015, 7:39 PM  [facility] [address] [place], [place], [address] Phone: [phone]   Fax:  [phone]  Name: Benancio Deedsony Simeone MRN: [id number]  Date of Birth: 1950-10-25

## 2015-11-24 NOTE — Therapy (Signed)
[facility] [address] [place], [place], [address] Phone: [phone]   Fax:  [phone]  Physical Therapy Treatment  Patient Details  Name: Carl [name] MRN: [id number] Date of Birth: Jan 10, 1951 Referring Provider: Dr. Faith Rogue  Encounter Date: 11/20/2015      PT End of Session - 11/24/15 2115    Visit Number 3   Number of Visits 17   Date for PT Re-Evaluation 12/24/15   Authorization Type Medicare   Authorization Time Period 10-25-15 - 12-24-15   PT Start Time 1102   PT Stop Time 1145   PT Time Calculation (min) 43 min      Past Medical History:  Diagnosis Date  . Bradycardia   . Chronic abdominal pain   . Hesitancy of micturition     Past Surgical History:  Procedure Laterality Date  . ANTERIOR CERVICAL DECOMP/DISCECTOMY FUSION N/A 11/14/2014   Procedure: Cervical Five-Cervical Six  Anterior Cervical Decompression/Diskectomy/Fusion;  Surgeon: Maeola Harman, MD;  Location: [facility];  Service: Neurosurgery;  Laterality: N/A;    There were no vitals filed for this visit.      Subjective Assessment - 11/24/15 2107    Subjective Pt reports no problems or changes since last PT visit   Patient Stated Goals Increase LLE strength; decr. tightness in LLE   Currently in Pain? Yes   Pain Score 4    Pain Location Arm   Pain Orientation Left   Pain Descriptors / Indicators Aching   Pain Type Chronic pain   Pain Onset More than a month ago   Pain Frequency Constant                         [facility] Adult PT Treatment/Exercise - 11/24/15 0001      Lumbar Exercises: Stretches   Active Hamstring Stretch 1 rep;30 seconds   Lower Trunk Rotation 1 rep;20 seconds   Prone on Elbows Stretch 2 reps;30 seconds     Lumbar Exercises: Standing   Heel Raises 10 reps     Lumbar Exercises: Supine   Clam 10 reps   Bridge 10 reps   Straight Leg Raise 10 reps  2# weight on LLE; 4# on RLE      Knee/Hip Exercises: Stretches   Lobbyist 2 reps;20 seconds  in prone   Gastroc Stretch Both;1 rep;30 seconds     Knee/Hip Exercises: Aerobic   Recumbent Bike SciFit level 2.5 x 5" with UE and LE's     Knee/Hip Exercises: Scientist, water quality for Strengthening   Cybex Leg Press .     Knee/Hip Exercises: Seated   Hamstring Curl Both;1 set;10 reps  with contract/relax for quad strengthening     Knee/Hip Exercises: Supine   Other Supine Knee/Hip Exercises Bridging with hip abdct/adduction; with marching: with LE extension x 5 reps each     standing hip extension LLE - 10 reps  Quadriped position - sitting back toward heels for stretching - 20 sec hold Knee flexion/extension with leg extended in quadriped position - 10 reps LLE           PT Education - 11/24/15 2114    Education provided Yes   Education Details instructed pt in bridging exercises, hamstring and heel cord stretches   Person(s) Educated Patient   Methods Explanation;Demonstration;Handout   Comprehension Verbalized understanding;Returned demonstration          PT Short Term Goals - 10/28/15 1607  PT SHORT TERM GOAL #1   Title Increase gait velocity to >/= 2.40 ft/sec with cane.  11-25-15   Baseline 2.03 ft/sec   Time 4   Period Weeks   Status New     PT SHORT TERM GOAL #2   Title Improve TUG score to </= 15 secs for reduced fall risk.  11-25-15   Baseline 17.78 secs on 10-25-15 with cane   Time 4   Period Weeks   Status New     PT SHORT TERM GOAL #3   Title Amb. 100' without device without occurrence of L toes catching for incr. safety with household mobility.  11-25-15   Time 4   Period Weeks   Status New     PT SHORT TERM GOAL #4   Title Negotiate steps without hand rail step by step with SBA .  11-25-15   Time 4   Period Weeks   Status New     PT SHORT TERM GOAL #5   Title Independent in HEP for stretching and strengthening exs.  11-25-15   Time 4   Period Weeks   Status New            PT Long Term Goals - 10/28/15 1616      PT LONG TERM GOAL #1   Title Increase gait velocity to >/= 2.7 ft/sec with cane to demo increased gait efficiency.  (12-24-15)   Baseline 2.03 ft/sec with cane - 10-25-15   Time 8   Period Weeks   Status New     PT LONG TERM GOAL #2   Title Improve TUG score to </= 13.5 secs with cane for decr. fall risk.  (12-24-15)   Baseline 17.78 on 10-25-15 with cane   Time 8   Period Weeks   Status New     PT LONG TERM GOAL #3   Title Increase L SLS to >/= 4 secs to demo improved balance.  (12-24-15)   Baseline L SLS = 2.53 secs;   Time 8   Period Weeks   Status New     PT LONG TERM GOAL #4   Title Amb. at least 500' with cane on paved outdoor surfaces with minimal c/o fatigue and no LOB.  (12-24-15)   Time 8   Period Weeks   Status New               Plan - 11/24/15 2115    Clinical Impression Statement Pt has increased tightness in trunk and LE's - this spasticity impacts ability to stand erect and limits knee flexion and dorisflexion in swing phase of gait   Rehab Potential Good   PT Frequency 2x / week   PT Duration 8 weeks   PT Treatment/Interventions ADLs/Self Care Home Management;Gait training;Stair training;Functional mobility training;Therapeutic activities;Patient/family education;Neuromuscular re-education;Balance training;Therapeutic exercise;Orthotic Fit/Training;Passive range of motion   PT Next Visit Plan LE strengthening and stretching   PT Home Exercise Plan see above   Consulted and Agree with Plan of Care Patient      Patient will benefit from skilled therapeutic intervention in order to improve the following deficits and impairments:  Abnormal gait, Decreased activity tolerance, Decreased balance, Decreased coordination, Decreased range of motion, Decreased mobility, Decreased strength, Impaired flexibility, Increased muscle spasms, Impaired sensation, Impaired tone, Impaired UE functional use, Pain  Visit  Diagnosis: Muscle weakness (generalized)  Other symptoms and signs involving the musculoskeletal system  Cramp and spasm     Problem List Patient Active Problem List  Diagnosis Date Noted  . Orthostatic hypotension 11/30/2014  . C6 spinal cord injury (HCC) 11/20/2014  . Neurogenic bowel 11/20/2014  . Neurogenic bladder 11/20/2014  . Fever of unknown origin   . 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 C5 vertebral fracture (HCC) 11/09/2014    Corby Villasenor, Donavan BurnetLinda Suzanne, PT 11/24/2015, 9:19 PM  [facility] [address] [place], [place], [address] Phone: [phone]   Fax:  [phone]  Name: Carl [name] MRN: [id number] Date of Birth: 10-06-1950

## 2015-11-26 ENCOUNTER — Ambulatory Visit: Payer: Medicare Other | Admitting: Physical Therapy

## 2015-11-26 ENCOUNTER — Ambulatory Visit: Payer: Medicare Other | Admitting: Occupational Therapy

## 2015-11-26 DIAGNOSIS — M6281 Muscle weakness (generalized): Secondary | ICD-10-CM | POA: Diagnosis not present

## 2015-11-26 DIAGNOSIS — R278 Other lack of coordination: Secondary | ICD-10-CM

## 2015-11-26 DIAGNOSIS — R29898 Other symptoms and signs involving the musculoskeletal system: Secondary | ICD-10-CM

## 2015-11-26 DIAGNOSIS — M25642 Stiffness of left hand, not elsewhere classified: Secondary | ICD-10-CM

## 2015-11-26 DIAGNOSIS — R2689 Other abnormalities of gait and mobility: Secondary | ICD-10-CM

## 2015-11-26 NOTE — Therapy (Signed)
[facility] [address] [facility] [address] [place], [place], [address] Phone: [phone]   Fax:  [phone]  Physical Therapy Treatment  Patient Details  Name: Carl [name] MRN: [id number] Date of Birth: 06/30/1950 Referring Provider: Dr. Faith RogueZachary Swartz  Encounter Date: 11/26/2015      PT End of Session - 11/28/15 1107    Visit Number 4   Number of Visits 17   Date for PT Re-Evaluation 12/24/15   Authorization Type Medicare   Authorization Time Period 10-25-15 - 12-24-15   PT Start Time 1315   PT Stop Time 1400   PT Time Calculation (min) 45 min      Past Medical History:  Diagnosis Date  . Bradycardia   . Chronic abdominal pain   . Hesitancy of micturition     Past Surgical History:  Procedure Laterality Date  . ANTERIOR CERVICAL DECOMP/DISCECTOMY FUSION N/A 11/14/2014   Procedure: Cervical Five-Cervical Six  Anterior Cervical Decompression/Diskectomy/Fusion;  Surgeon: Maeola HarmanJoseph Stern, MD;  Location: [facility];  Service: Neurosurgery;  Laterality: N/A;    There were no vitals filed for this visit.      Subjective Assessment - 11/28/15 1101    Subjective Pt reports doing stretches daily to help with the tightness in legs and trunk   Patient Stated Goals Increase LLE strength; decr. tightness in LLE      TherEx:    Bridging x 10 reps Bridging with hip abduction/adduction x 10 reps Bridging with marching x 10 reps Bridging with RLE extension x 10 reps  SLR LLE 7 reps no weight; 2# weight 5 reps R hip flexion 4# 10 reps - SLR               [facility] Adult PT Treatment/Exercise - 11/28/15 0001      Lumbar Exercises: Stretches   Active Hamstring Stretch 1 rep;30 seconds   Prone on Elbows Stretch 1 rep;30 seconds   Prone on Elbows Stretch Limitations tightness in abdominals   Quadruped Mid Back Stretch 1 rep;10 seconds  cat/camel x  3 reps; sitting back toward heels 10 sec hold     Lumbar Exercises:  Machines for Strengthening   Leg Press bil. LE's 50# 1 set 15 reps: LLE only 30# 2 sets 10 reps     Lumbar Exercises: Standing   Heel Raises 10 reps     Lumbar Exercises: Supine   Clam 10 reps   Bridge 10 reps     Lumbar Exercises: Sidelying   Hip Abduction 10 reps  LLE   Hip Abduction Weights (lbs) 2#     Knee/Hip Exercises: Stretches   Gastroc Stretch Both;1 rep;30 seconds     Knee/Hip Exercises: Aerobic   Recumbent Bike SciFit level 2.5 x 5" with UE and LE's                  PT Short Term Goals - 10/28/15 1607      PT SHORT TERM GOAL #1   Title Increase gait velocity to >/= 2.40 ft/sec with cane.  11-25-15   Baseline 2.03 ft/sec   Time 4   Period Weeks   Status New     PT SHORT TERM GOAL #2   Title Improve TUG score to </= 15 secs for reduced fall risk.  11-25-15   Baseline 17.78 secs on 10-25-15 with cane   Time 4   Period Weeks   Status New     PT SHORT TERM GOAL #3   Title  Amb. 100' without device without occurrence of L toes catching for incr. safety with household mobility.  11-25-15   Time 4   Period Weeks   Status New     PT SHORT TERM GOAL #4   Title Negotiate steps without hand rail step by step with SBA .  11-25-15   Time 4   Period Weeks   Status New     PT SHORT TERM GOAL #5   Title Independent in HEP for stretching and strengthening exs.  11-25-15   Time 4   Period Weeks   Status New           PT Long Term Goals - 10/28/15 1616      PT LONG TERM GOAL #1   Title Increase gait velocity to >/= 2.7 ft/sec with cane to demo increased gait efficiency.  (12-24-15)   Baseline 2.03 ft/sec with cane - 10-25-15   Time 8   Period Weeks   Status New     PT LONG TERM GOAL #2   Title Improve TUG score to </= 13.5 secs with cane for decr. fall risk.  (12-24-15)   Baseline 17.78 on 10-25-15 with cane   Time 8   Period Weeks   Status New     PT LONG TERM GOAL #3   Title Increase L SLS to >/= 4 secs to demo improved balance.  (12-24-15)    Baseline L SLS = 2.53 secs;   Time 8   Period Weeks   Status New     PT LONG TERM GOAL #4   Title Amb. at least 500' with cane on paved outdoor surfaces with minimal c/o fatigue and no LOB.  (12-24-15)   Time 8   Period Weeks   Status New               Plan - 11/28/15 1108    Clinical Impression Statement Pt continues to have increased tightness in LE's with LLE weaker than RLE and increased tightness in abdominals which impacts pt's ability to stand erect and to achieve complete toe clearance consistently in swing phase of gait   Rehab Potential Good   PT Frequency 2x / week   PT Duration 8 weeks   PT Treatment/Interventions ADLs/Self Care Home Management;Gait training;Stair training;Functional mobility training;Therapeutic activities;Patient/family education;Neuromuscular re-education;Balance training;Therapeutic exercise;Orthotic Fit/Training;Passive range of motion   PT Next Visit Plan cont LE strengthening and stretching - STG's extended out 1 week due to pt not beginning therapy until 2 weeks after eval   PT Home Exercise Plan see above   Consulted and Agree with Plan of Care Patient      Patient will benefit from skilled therapeutic intervention in order to improve the following deficits and impairments:  Abnormal gait, Decreased activity tolerance, Decreased balance, Decreased coordination, Decreased range of motion, Decreased mobility, Decreased strength, Impaired flexibility, Increased muscle spasms, Impaired sensation, Impaired tone, Impaired UE functional use, Pain  Visit Diagnosis: Other abnormalities of gait and mobility  Muscle weakness (generalized)     Problem List Patient Active Problem List   Diagnosis Date Noted  . Orthostatic hypotension 11/30/2014  . C6 spinal cord injury (HCC) 11/20/2014  . Neurogenic bowel 11/20/2014  . Neurogenic bladder 11/20/2014  . Fever of unknown origin   . 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 C5 vertebral fracture  (HCC) 11/09/2014    Katana Berthold, Donavan Burnet, PT 11/28/2015, 11:13 AM  [place] [facility] [address] [place], [place], [address]  Phone: [phone]   Fax:  [phone]  Name: Carl [name] MRN: [id number] Date of Birth: [birth year]

## 2015-11-26 NOTE — Patient Instructions (Signed)
Your Splint This splint should initially be fitted by a healthcare practitioner.  The healthcare practitioner is responsible for providing wearing instructions and precautions to the patient, other healthcare practitioners and care provider involved in the patient's care.  This splint was custom made for you. Please read the following instructions to learn about wearing and caring for your splint.  Precautions Should your splint cause any of the following problems, remove the splint immediately and contact your therapist/physician.  Swelling  Severe Pain  Pressure Areas  Stiffness  Numbness  Do not wear your splint while operating machinery unless it has been fabricated for that purpose.  When To Wear Your Splint Where your splint according to your therapist/physician instructions. Daytime for 4 hours initially for the first 2 days, if no problems after the first 2 days, you may begin wearing only at night.  Care and Cleaning of Your Splint 1. Keep your splint away from open flames. 2. Your splint will lose its shape in temperatures over 135 degrees Farenheit, ( in car windows, near radiators, ovens or in hot water).  Never make any adjustments to your splint, if the splint needs adjusting remove it and make an appointment to see your therapist. 3. Your splint, including the cushion liner may be cleaned with soap and lukewarm water.  Do not immerse in hot water over 135 degrees Farenheit. 4. Straps may be washed with soap and water, but do not moisten the self-adhesive portion.  Rubbing alcohol works best 5. For ink or hard to remove spots use a scouring cleanser which contains chlorine.  Rinse the splint thoroughly after using chlorine cleanser.

## 2015-11-26 NOTE — Therapy (Signed)
[facility] [address] [place], [place], [address] Phone: [phone]   Fax:  [phone]  Occupational Therapy Treatment  Patient Details  Name: Carl [name] MRN: [id number] Date of Birth: [birth year] Referring Provider: Dr. Riley KillSwartz  Encounter Date: 11/26/2015      OT End of Session - 11/26/15 1455    Visit Number 4   Number of Visits 17   Date for OT Re-Evaluation 12/26/15   Authorization Type Medicare   Authorization - Visit Number 4   Authorization - Number of Visits 10   OT Start Time 1402   OT Stop Time 1445   OT Time Calculation (min) 43 min   Activity Tolerance Patient tolerated treatment well   Behavior During Therapy [facility] for tasks assessed/performed      Past Medical History:  Diagnosis Date  . Bradycardia   . Chronic abdominal pain   . Hesitancy of micturition     Past Surgical History:  Procedure Laterality Date  . ANTERIOR CERVICAL DECOMP/DISCECTOMY FUSION N/A 11/14/2014   Procedure: Cervical Five-Cervical Six  Anterior Cervical Decompression/Diskectomy/Fusion;  Surgeon: Maeola HarmanJoseph Stern, MD;  Location: [facility];  Service: Neurosurgery;  Laterality: N/A;    There were no vitals filed for this visit.      Subjective Assessment - 11/26/15 1404    Subjective  "doing ok "  "I feel better today than I have been"   Pertinent History see Epic   Patient Stated Goals increase strength and use of LUe   Currently in Pain? Yes   Pain Score 3    Pain Location Arm   Pain Orientation Left  a little in the RUE   Pain Descriptors / Indicators Throbbing;Tingling   Pain Onset More than a month ago   Pain Frequency Constant   Aggravating Factors  cold   Pain Relieving Factors meds                      OT Treatments/Exercises (OP) - 11/26/15 0001      Splinting   Splinting Fabricated custom L resting hand splint with IPs in slight flex and MPs in ext as able for improved positioning.                 OT Education - 11/26/15 1454    Education Details splint wear/care   Person(s) Educated Patient   Methods Explanation;Demonstration;Handout;Verbal cues   Comprehension Verbalized understanding          OT Short Term Goals - 10/25/15 1614      OT SHORT TERM GOAL #1   Title Pt will verbalize understanding of LUE positioning to minimize pain and risk for contracture   Time 4   Period Weeks   Status New     OT SHORT TERM GOAL #2   Title Pt will be independent with updated HEP    Time 4   Period Weeks   Status New     OT SHORT TERM GOAL #3   Title Pt will demonstrate improved LUE shoulder flexion as evidenced by retrieving a lightweight object at 110 shoulder flexion with pain no greater than 3/10.   Time 4   Period Weeks   Status New     OT SHORT TERM GOAL #4   Title Pt will demonstrate at least 95% composite finger extension for LUE.   Time 4   Period Weeks   Status New  OT Long Term Goals - 10/25/15 1618      OT LONG TERM GOAL #1   Title Pt will improve LUE fine motor coordination as evidenced by decreasing 9 hole peg test score to 45 secs or less   Baseline 56.38 secs   Time 8   Status New     OT LONG TERM GOAL #2   Title Pt will increase LUE forearm pronation to 65* for increased LUE functional use.   Time 8   Period Weeks   Status New     OT LONG TERM GOAL #3   Title Pt will demonstrate improved LUE functional use as evidenced by increasing box/ blocks to 48 blocks.   Baseline LUE 44 blocks , RUE 52 blocks   Time 8   Period Weeks   Status New     OT LONG TERM GOAL #4   Title Pt will use LUE as a non dominant assist for ADLs/IADLs at least 50% of the time with pain no greater than 3/10   Time 8   Period Weeks   Status New               Plan - 11/26/15 1456    Clinical Impression Statement Pt with improved positioning after custom L splint fabrication (as pt presents with MP flex and IP ext and ulnar  deviation).  Pt verbalized understanding of splint wear/care.   Rehab Potential Good   OT Frequency 2x / week   OT Duration 8 weeks   OT Treatment/Interventions Self-care/ADL training;Therapeutic exercise;Patient/family education;Balance training;Splinting;Manual Therapy;Neuromuscular education;Ultrasound;Energy conservation;Therapeutic exercises;Therapeutic activities;DME and/or AE instruction;Parrafin;Cryotherapy;Electrical Stimulation;Fluidtherapy;Passive range of motion;Contrast Bath;Moist Heat   Plan neuro re-ed LUE/shoulder   OT Home Exercise Plan Education provided:  HEP, splint wear/care (custom L resting hand splint)   Consulted and Agree with Plan of Care Patient      Patient will benefit from skilled therapeutic intervention in order to improve the following deficits and impairments:  Abnormal gait, Decreased coordination, Decreased range of motion, Difficulty walking, Impaired flexibility, Impaired sensation, Decreased safety awareness, Decreased endurance, Decreased activity tolerance, Decreased knowledge of precautions, Impaired tone, Pain, Impaired UE functional use, Decreased knowledge of use of DME, Decreased balance, Decreased mobility, Decreased strength  Visit Diagnosis: Stiffness of left hand, not elsewhere classified  Other symptoms and signs involving the musculoskeletal system  Muscle weakness (generalized)  Other lack of coordination    Problem List Patient Active Problem List   Diagnosis Date Noted  . Orthostatic hypotension 11/30/2014  . C6 spinal cord injury (HCC) 11/20/2014  . Neurogenic bowel 11/20/2014  . Neurogenic bladder 11/20/2014  . Fever of unknown origin   . 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 C5 vertebral fracture (HCC) 11/09/2014    [facility] Hospital[facility]REEMAN,Kaye Luoma 11/26/2015, 2:58 PM  [facility] [address] [place], [place], [address] Phone: [phone]   Fax:  [phone]  Name: Carl Deedsony  [name] MRN: [id number] Date of Birth: [birth year]   Willa FraterAngela Manav Pierotti, OTR/L [facility] [address] [place], [place]  [address] [phone] phone [phone] 11/26/15 2:58 PM

## 2015-11-29 ENCOUNTER — Ambulatory Visit: Payer: Medicare Other | Admitting: Occupational Therapy

## 2015-11-29 ENCOUNTER — Ambulatory Visit: Payer: Medicare Other | Admitting: Physical Therapy

## 2015-11-29 DIAGNOSIS — R29898 Other symptoms and signs involving the musculoskeletal system: Secondary | ICD-10-CM

## 2015-11-29 DIAGNOSIS — R278 Other lack of coordination: Secondary | ICD-10-CM

## 2015-11-29 DIAGNOSIS — M6281 Muscle weakness (generalized): Secondary | ICD-10-CM

## 2015-11-29 DIAGNOSIS — M25642 Stiffness of left hand, not elsewhere classified: Secondary | ICD-10-CM

## 2015-11-29 DIAGNOSIS — R2689 Other abnormalities of gait and mobility: Secondary | ICD-10-CM

## 2015-11-29 NOTE — Therapy (Signed)
[facility] [address] [place], [place], [address] Phone: [phone]   Fax:  [phone]  Physical Therapy Treatment  Patient Details  Name: Carl [name] MRN: [id number] Date of Birth: 04-17-1950 Referring Provider: Dr. Faith Rogue  Encounter Date: 11/29/2015      PT End of Session - 11/29/15 1413    Visit Number 5   Number of Visits 17   Date for PT Re-Evaluation 12/24/15   Authorization Type Medicare   Authorization Time Period 10-25-15 - 12-24-15   PT Start Time 1404   PT Stop Time 1445   PT Time Calculation (min) 41 min   Activity Tolerance Patient tolerated treatment well      Past Medical History:  Diagnosis Date  . Bradycardia   . Chronic abdominal pain   . Hesitancy of micturition     Past Surgical History:  Procedure Laterality Date  . ANTERIOR CERVICAL DECOMP/DISCECTOMY FUSION N/A 11/14/2014   Procedure: Cervical Five-Cervical Six  Anterior Cervical Decompression/Diskectomy/Fusion;  Surgeon: Maeola Harman, MD;  Location: [facility];  Service: Neurosurgery;  Laterality: N/A;    There were no vitals filed for this visit.      Subjective Assessment - 11/29/15 1408    Subjective Pt reports being on an antibiotic for UTI but not sure of the name.  Denies falls.   Patient Stated Goals Increase LLE strength; decr. tightness in LLE   Currently in Pain? Yes   Pain Score 4    Pain Location Arm   Pain Orientation Left   Pain Descriptors / Indicators Throbbing;Tingling   Pain Type Chronic pain   Pain Onset More than a month ago   Pain Frequency Constant   Aggravating Factors  cold   Pain Relieving Factors exercise or pain pills        TherEx:    Bridging x 10 reps Bridging with hip abduction/adduction x 10 reps Bridging with marching x 10 reps Bridging with RLE extension x 10 reps Clams x 10 with blue theraband then LLE clam with RLE stable x 10 with blue theraband  L hip flexion/SLR  with 2# wt 10reps x 2 sets R hip flexion/SLR with 4# 10 reps x 2 sets              [facility] Adult PT Treatment/Exercise - 11/28/15 0001            Lumbar Exercises: Stretches       Prone on Elbows Stretch 1 rep;90 seconds   Prone on Elbows Stretch Limitations tightness in abdominals   Quadruped Mid Back Stretch  cat/camel x  5 reps; sitting back toward heels 10 sec hold       Lumbar Exercises: Machines for Strengthening   Leg Press bil. LE's 50# 1 set 10 reps then 10 reps with 55#: LLE only 35# 2 sets 10 reps                                 Lumbar Exercises: Sidelying   Hip Abduction 10 reps  LLE with blue theraband x 2 sets   Hip Abduction Weights (lbs) Blue therband                  Knee/Hip Exercises: Aerobic   Recumbent Bike SciFit level 3.0 x 5" with UE and LE's x 8 minutes           PT Short Term Goals -  10/28/15 1607      PT SHORT TERM GOAL #1   Title Increase gait velocity to >/= 2.40 ft/sec with cane.  11-25-15   Baseline 2.03 ft/sec   Time 4   Period Weeks   Status New     PT SHORT TERM GOAL #2   Title Improve TUG score to </= 15 secs for reduced fall risk.  11-25-15   Baseline 17.78 secs on 10-25-15 with cane   Time 4   Period Weeks   Status New     PT SHORT TERM GOAL #3   Title Amb. 100' without device without occurrence of L toes catching for incr. safety with household mobility.  11-25-15   Time 4   Period Weeks   Status New     PT SHORT TERM GOAL #4   Title Negotiate steps without hand rail step by step with SBA .  11-25-15   Time 4   Period Weeks   Status New     PT SHORT TERM GOAL #5   Title Independent in HEP for stretching and strengthening exs.  11-25-15   Time 4   Period Weeks   Status New           PT Long Term Goals - 10/28/15 1616      PT LONG TERM GOAL #1   Title Increase gait velocity to >/= 2.7 ft/sec with cane to demo increased gait efficiency.  (12-24-15)   Baseline 2.03 ft/sec  with cane - 10-25-15   Time 8   Period Weeks   Status New     PT LONG TERM GOAL #2   Title Improve TUG score to </= 13.5 secs with cane for decr. fall risk.  (12-24-15)   Baseline 17.78 on 10-25-15 with cane   Time 8   Period Weeks   Status New     PT LONG TERM GOAL #3   Title Increase L SLS to >/= 4 secs to demo improved balance.  (12-24-15)   Baseline L SLS = 2.53 secs;   Time 8   Period Weeks   Status New     PT LONG TERM GOAL #4   Title Amb. at least 500' with cane on paved outdoor surfaces with minimal c/o fatigue and no LOB.  (12-24-15)   Time 8   Period Weeks   Status New               Plan - 11/29/15 1438    Clinical Impression Statement Pt continues with LLE weakness but able to tolerate increased weight and resistance today.  Continue PT per POC.   Rehab Potential Good   PT Frequency 2x / week   PT Duration 8 weeks   PT Treatment/Interventions ADLs/Self Care Home Management;Gait training;Stair training;Functional mobility training;Therapeutic activities;Patient/family education;Neuromuscular re-education;Balance training;Therapeutic exercise;Orthotic Fit/Training;Passive range of motion   PT Next Visit Plan cont LE strengthening and stretching   PT Home Exercise Plan see above   Consulted and Agree with Plan of Care Patient      Patient will benefit from skilled therapeutic intervention in order to improve the following deficits and impairments:  Abnormal gait, Decreased activity tolerance, Decreased balance, Decreased coordination, Decreased range of motion, Decreased mobility, Decreased strength, Impaired flexibility, Increased muscle spasms, Impaired sensation, Impaired tone, Impaired UE functional use, Pain  Visit Diagnosis: Other abnormalities of gait and mobility  Muscle weakness (generalized)     Problem List Patient Active Problem List   Diagnosis Date Noted  . Orthostatic  hypotension 11/30/2014  . C6 spinal cord injury (HCC) 11/20/2014  .  Neurogenic bowel 11/20/2014  . Neurogenic bladder 11/20/2014  . Fever of unknown origin   . 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 C5 vertebral fracture (HCC) 11/09/2014    Newell Coralobertson, Carl [name] 11/29/2015, 2:51 PM   [facility] [address] [place], [place], [address] Phone: [phone]   Fax:  [phone]  Name: Benancio Deedsony Laba MRN: [id number] Date of Birth: 08/16/1950   Newell Coralenise [name] Carl [name], [place]PT[place] [facility] 11/29/15 2:51 PM Phone: [phone] Fax: [phone]

## 2015-11-29 NOTE — Therapy (Signed)
[facility] [address] [place], [place], [address] Phone: [phone]   Fax:  [phone]  Occupational Therapy Treatment  Patient Details  Name: Carl [name] MRN: [id number] Date of Birth: 1950/07/07 Referring Provider: Dr. Riley KillSwartz  Encounter Date: 11/29/2015      OT End of Session - 11/29/15 1715    Visit Number 5   Number of Visits 17   Date for OT Re-Evaluation 12/26/15   Authorization Type Medicare   Authorization - Visit Number 5   Authorization - Number of Visits 10   OT Start Time 1450   OT Stop Time 1530   OT Time Calculation (min) 40 min   Activity Tolerance Patient tolerated treatment well   Behavior During Therapy [facility] for tasks assessed/performed      Past Medical History:  Diagnosis Date  . Bradycardia   . Chronic abdominal pain   . Hesitancy of micturition     Past Surgical History:  Procedure Laterality Date  . ANTERIOR CERVICAL DECOMP/DISCECTOMY FUSION N/A 11/14/2014   Procedure: Cervical Five-Cervical Six  Anterior Cervical Decompression/Diskectomy/Fusion;  Surgeon: Maeola HarmanJoseph Stern, MD;  Location: [facility];  Service: Neurosurgery;  Laterality: N/A;    There were no vitals filed for this visit.      Subjective Assessment - 11/29/15 1714    Pertinent History see Epic   Patient Stated Goals increase strength and use of LUe   Currently in Pain? Yes   Pain Score 4    Pain Location Arm   Pain Orientation Left   Pain Descriptors / Indicators Tingling;Throbbing   Pain Type Chronic pain   Pain Onset More than a month ago   Pain Frequency Constant   Aggravating Factors  cold , malpositioning   Pain Relieving Factors exercise           Treatment: Therapist checked splint fit and discussed wear with pt. He is wearing only for short periods during daytime. Pt practiced getting up from supine on floor, min v.c., and only min a, pt can benefit from reinforcement. Arm bike x 4 mins level  4 for conditioning. Discontinued due to pain. Reviewed previous cane exercises and discontinued shoulder abduction due to pain. Quadraped lifting alternate UE's then rocking back and forth min facilitation. NMES 50 pps, 250 pw, 10 secs cycle to wrist and finger extensors x 5 mins, no adverse reactions however it increased wrist extension more than MP extension, and therefore discontinued.                      OT Short Term Goals - 11/29/15 1517      OT SHORT TERM GOAL #1   Title Pt will verbalize understanding of LUE positioning to minimize pain and risk for contracture   Time 4   Period Weeks   Status Achieved     OT SHORT TERM GOAL #2   Title Pt will be independent with updated HEP    Time 4   Period Weeks   Status Achieved     OT SHORT TERM GOAL #3   Title Pt will demonstrate improved LUE shoulder flexion as evidenced by retrieving a lightweight object at 110 shoulder flexion with pain no greater than 3/10.   Time 4   Period Weeks   Status On-going     OT SHORT TERM GOAL #4   Title Pt will demonstrate at least 95% composite finger extension for LUE.   Time 4   Period Weeks  Status On-going           OT Long Term Goals - 11/29/15 1517      OT LONG TERM GOAL #1   Title Pt will improve LUE fine motor coordination as evidenced by decreasing 9 hole peg test score to 45 secs or less   Baseline 56.38 secs   Time 8   Status New     OT LONG TERM GOAL #2   Title Pt will increase LUE forearm pronation to 65* for increased LUE functional use.   Time 8   Period Weeks   Status New     OT LONG TERM GOAL #3   Title Pt will demonstrate improved LUE functional use as evidenced by increasing box/ blocks to 48 blocks.   Baseline LUE 44 blocks , RUE 52 blocks   Time 8   Period Weeks   Status New     OT LONG TERM GOAL #4   Title Pt will use LUE as a non dominant assist for ADLs/IADLs at least 50% of the time with pain no greater than 3/10   Time 8   Period  Weeks   Status New               Plan - 11/29/15 1715    Clinical Impression Statement Pt demonstrates improved positioning while wearin splint. Pt reports splint fits well.   Rehab Potential Good   OT Frequency 2x / week   OT Duration 8 weeks   OT Treatment/Interventions Self-care/ADL training;Therapeutic exercise;Patient/family education;Balance training;Splinting;Manual Therapy;Neuromuscular education;Ultrasound;Energy conservation;Therapeutic exercises;Therapeutic activities;DME and/or AE instruction;Parrafin;Cryotherapy;Electrical Stimulation;Fluidtherapy;Passive range of motion;Contrast Bath;Moist Heat   Plan neuro re-ed   OT Home Exercise Plan Education provided:  HEP, splint wear/care (custom L resting hand splint)   Consulted and Agree with Plan of Care Patient      Patient will benefit from skilled therapeutic intervention in order to improve the following deficits and impairments:  Abnormal gait, Decreased coordination, Decreased range of motion, Difficulty walking, Impaired flexibility, Impaired sensation, Decreased safety awareness, Decreased endurance, Decreased activity tolerance, Decreased knowledge of precautions, Impaired tone, Pain, Impaired UE functional use, Decreased knowledge of use of DME, Decreased balance, Decreased mobility, Decreased strength  Visit Diagnosis: Muscle weakness (generalized)  Stiffness of left hand, not elsewhere classified  Other symptoms and signs involving the musculoskeletal system  Other lack of coordination    Problem List Patient Active Problem List   Diagnosis Date Noted  . Orthostatic hypotension 11/30/2014  . C6 spinal cord injury (HCC) 11/20/2014  . Neurogenic bowel 11/20/2014  . Neurogenic bladder 11/20/2014  . Fever of unknown origin   . 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 C5 vertebral fracture (HCC) 11/09/2014    [name] 11/29/2015, 5:16 PM Keene BreathKathryn Aerianna Losey, OTR/L [phone] Phone: [phone] 5:16 PM 11/29/15 [facility] [address] [place], [place], [address] Phone: [phone]   Fax:  [phone]  Name: Carl [name] MRN: [id number] Date of Birth: 01-20-1951

## 2015-12-03 ENCOUNTER — Ambulatory Visit: Payer: Medicare Other | Admitting: Physical Therapy

## 2015-12-03 ENCOUNTER — Ambulatory Visit: Payer: Medicare Other | Admitting: Occupational Therapy

## 2015-12-03 DIAGNOSIS — R2689 Other abnormalities of gait and mobility: Secondary | ICD-10-CM

## 2015-12-03 DIAGNOSIS — R29898 Other symptoms and signs involving the musculoskeletal system: Secondary | ICD-10-CM

## 2015-12-03 DIAGNOSIS — M25612 Stiffness of left shoulder, not elsewhere classified: Secondary | ICD-10-CM

## 2015-12-03 DIAGNOSIS — M25642 Stiffness of left hand, not elsewhere classified: Secondary | ICD-10-CM

## 2015-12-03 DIAGNOSIS — M79602 Pain in left arm: Secondary | ICD-10-CM

## 2015-12-03 DIAGNOSIS — M6281 Muscle weakness (generalized): Secondary | ICD-10-CM | POA: Diagnosis not present

## 2015-12-03 NOTE — Therapy (Signed)
[facility] [address] [place], [place], [address] Phone: [phone]   Fax:  [phone]  Occupational Therapy Treatment  Patient Details  Name: Carl [name] MRN: [id number] Date of Birth: 01/03/1951 Referring Provider: Dr. Riley KillSwartz  Encounter Date: 12/03/2015      OT End of Session - 12/03/15 1433    Visit Number 6   Number of Visits 17   Date for OT Re-Evaluation 12/26/15   Authorization Type Medicare   Authorization - Visit Number 6   Authorization - Number of Visits 10   OT Start Time 1405   OT Stop Time 1445   OT Time Calculation (min) 40 min   Activity Tolerance Patient tolerated treatment well   Behavior During Therapy [facility] for tasks assessed/performed      Past Medical History:  Diagnosis Date  . Bradycardia   . Chronic abdominal pain   . Hesitancy of micturition     Past Surgical History:  Procedure Laterality Date  . ANTERIOR CERVICAL DECOMP/DISCECTOMY FUSION N/A 11/14/2014   Procedure: Cervical Five-Cervical Six  Anterior Cervical Decompression/Diskectomy/Fusion;  Surgeon: Maeola HarmanJoseph Stern, MD;  Location: [facility];  Service: Neurosurgery;  Laterality: N/A;    There were no vitals filed for this visit.      Subjective Assessment - 12/03/15 1429    Subjective  Pt reports increased shoulder pain, he preached in standing   Pertinent History see Epic   Patient Stated Goals increase strength and use of LUe   Currently in Pain? Yes   Pain Score 5    Pain Location Shoulder   Pain Descriptors / Indicators Aching   Pain Type Chronic pain   Pain Onset More than a month ago   Pain Frequency Constant   Aggravating Factors  cold, malpositioning   Pain Relieving Factors exercise   Multiple Pain Sites No          Treatment:ultrasound, 1mhz, 0.8 w/cm 2, continuous, x 8 mins to left shoulder/ traps, just above scapula for pain relief and tightness. Therapist  checked short term goals. Supine  unilateral shoulder flexion and chest press with 2 lbs weight and seated with hands in ext rotation with scapular depression, retraction, min v.c. For shoulder/ scapular Therapist  issued as HEP.                      OT Short Term Goals - 12/03/15 1437      OT SHORT TERM GOAL #1   Title Pt will verbalize understanding of LUE positioning to minimize pain and risk for contracture   Time 4   Period Weeks   Status Achieved     OT SHORT TERM GOAL #2   Title Pt will be independent with updated HEP    Time 4   Period Weeks   Status Achieved     OT SHORT TERM GOAL #3   Title Pt will demonstrate improved LUE shoulder flexion as evidenced by retrieving a lightweight object at 110 shoulder flexion with pain no greater than 3/10.   Time 4   Period Weeks   Status On-going  90-100 prior to pain     OT SHORT TERM GOAL #4   Title Pt will demonstrate at least 95% composite finger extension for LUE.   Time 4   Period Weeks   Status On-going  90%           OT Long Term Goals - 11/29/15 1517  OT LONG TERM GOAL #1   Title Pt will improve LUE fine motor coordination as evidenced by decreasing 9 hole peg test score to 45 secs or less   Baseline 56.38 secs   Time 8   Status New     OT LONG TERM GOAL #2   Title Pt will increase LUE forearm pronation to 65* for increased LUE functional use.   Time 8   Period Weeks   Status New     OT LONG TERM GOAL #3   Title Pt will demonstrate improved LUE functional use as evidenced by increasing box/ blocks to 48 blocks.   Baseline LUE 44 blocks , RUE 52 blocks   Time 8   Period Weeks   Status New     OT LONG TERM GOAL #4   Title Pt will use LUE as a non dominant assist for ADLs/IADLs at least 50% of the time with pain no greater than 3/10   Time 8   Period Weeks   Status New               Plan - 12/03/15 1431    Clinical Impression Statement Pt is progressing slowly towards goals. He remains limited by pain.    Rehab Potential Good   OT Frequency 2x / week   OT Duration 8 weeks   OT Treatment/Interventions Self-care/ADL training;Therapeutic exercise;Patient/family education;Balance training;Splinting;Manual Therapy;Neuromuscular education;Ultrasound;Energy conservation;Therapeutic exercises;Therapeutic activities;DME and/or AE instruction;Parrafin;Cryotherapy;Electrical Stimulation;Fluidtherapy;Passive range of motion;Contrast Bath;Moist Heat   Plan ultrasound, strength, ROM   OT Home Exercise Plan Education provided:  HEP, splint wear/care (custom L resting hand splint)   Consulted and Agree with Plan of Care Patient      Patient will benefit from skilled therapeutic intervention in order to improve the following deficits and impairments:  Abnormal gait, Decreased coordination, Decreased range of motion, Difficulty walking, Impaired flexibility, Impaired sensation, Decreased safety awareness, Decreased endurance, Decreased activity tolerance, Decreased knowledge of precautions, Impaired tone, Pain, Impaired UE functional use, Decreased knowledge of use of DME, Decreased balance, Decreased mobility, Decreased strength  Visit Diagnosis: Muscle weakness (generalized)  Stiffness of left hand, not elsewhere classified  Other symptoms and signs involving the musculoskeletal system  Pain In Left Arm  Stiffness of left shoulder, not elsewhere classified    Problem List Patient Active Problem List   Diagnosis Date Noted  . Orthostatic hypotension 11/30/2014  . C6 spinal cord injury (HCC) 11/20/2014  . Neurogenic bowel 11/20/2014  . Neurogenic bladder 11/20/2014  . Fever of unknown origin   . 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 C5 vertebral fracture (HCC) 11/09/2014    RINE,KATHRYN 12/03/2015, 4:14 PM Keene Breath, OTR/L [phone] Phone: [phone] 4:14 PM 12/03/15 [facility] [address] [place],  [place], [address] Phone: [phone]   Fax:  [phone]  Name: Carl [name] MRN: [id number] Date of Birth: 02-23-1951

## 2015-12-03 NOTE — Therapy (Signed)
[facility] [address] [place], [place], [address] Phone: [phone]   Fax:  [phone]  Physical Therapy Treatment  Patient Details  Name: Carl [name] MRN: [id number] Date of Birth: [birth year] Referring Provider: Dr. Faith RogueZachary Swartz  Encounter Date: 12/03/2015      PT End of Session - 12/05/15 0908    Visit Number 6   Number of Visits 17   Date for PT Re-Evaluation 12/24/15   Authorization Type Medicare   Authorization Time Period 10-25-15 - 12-24-15   PT Start Time 1315   PT Stop Time 1400   PT Time Calculation (min) 45 min      Past Medical History:  Diagnosis Date  . Bradycardia   . Chronic abdominal pain   . Hesitancy of micturition     Past Surgical History:  Procedure Laterality Date  . ANTERIOR CERVICAL DECOMP/DISCECTOMY FUSION N/A 11/14/2014   Procedure: Cervical Five-Cervical Six  Anterior Cervical Decompression/Diskectomy/Fusion;  Surgeon: Maeola HarmanJoseph Stern, MD;  Location: [facility];  Service: Neurosurgery;  Laterality: N/A;    There were no vitals filed for this visit.      Subjective Assessment - 12/05/15 0905    Subjective Pt reports no changes or falls   Patient Stated Goals Increase LLE strength; decr. tightness in LLE   Currently in Pain? Yes   Pain Score 5    Pain Location Shoulder   Pain Orientation Left;Lower   Pain Descriptors / Indicators Aching   Pain Type Chronic pain   Pain Onset More than a month ago   Pain Frequency Constant     TherEx: Pelvic tilts 10 reps  Single and double knee to chest - 20 sec hold - 1 rep each Trunk rotation stretch - 20 sec hold 1 rep to each side Bridging x 10 reps Bridging with RLE extension 5 reps Bridging with hip abduction/adduction 5 reps; Bridging with marching 5 reps SLR LLE 2# 10 reps:  SLR RLE 4# 10 reps L hip abduction 2# 1 set 10 reps in R sidelying Clam shell LLE 4# 1 set 10 reps    Leg press bil. LE's 60# 2 sets 10 reps:   LLE 40# 1 set 15:  RLE only 50# 1 set 15 reps           [facility] Adult PT Treatment/Exercise - 12/05/15 0001      Lumbar Exercises: Stretches   Active Hamstring Stretch 1 rep;30 seconds   Lower Trunk Rotation 1 rep;20 seconds   Prone on Elbows Stretch 1 rep;30 seconds   Prone on Elbows Stretch Limitations tightness in abdominals   Quadruped Mid Back Stretch 1 rep;20 seconds     Lumbar Exercises: Machines for Strengthening   Leg Press bil. LE's 50# 1 set 15 reps: LLE only 30# 2 sets 10 reps     Lumbar Exercises: Standing   Heel Raises 10 reps     Lumbar Exercises: Supine   Bridge 10 reps   Straight Leg Raise 10 reps  2# weight on LLE; 4# on RLE     Lumbar Exercises: Sidelying   Hip Abduction 10 reps  LLE   Hip Abduction Weights (lbs) 2#                  PT Short Term Goals - 10/28/15 1607      PT SHORT TERM GOAL #1   Title Increase gait velocity to >/= 2.40 ft/sec with cane.  11-25-15   Baseline 2.03 ft/sec  Time 4   Period Weeks   Status New     PT SHORT TERM GOAL #2   Title Improve TUG score to </= 15 secs for reduced fall risk.  11-25-15   Baseline 17.78 secs on 10-25-15 with cane   Time 4   Period Weeks   Status New     PT SHORT TERM GOAL #3   Title Amb. 100' without device without occurrence of L toes catching for incr. safety with household mobility.  11-25-15   Time 4   Period Weeks   Status New     PT SHORT TERM GOAL #4   Title Negotiate steps without hand rail step by step with SBA .  11-25-15   Time 4   Period Weeks   Status New     PT SHORT TERM GOAL #5   Title Independent in HEP for stretching and strengthening exs.  11-25-15   Time 4   Period Weeks   Status New           PT Long Term Goals - 10/28/15 1616      PT LONG TERM GOAL #1   Title Increase gait velocity to >/= 2.7 ft/sec with cane to demo increased gait efficiency.  (12-24-15)   Baseline 2.03 ft/sec with cane - 10-25-15   Time 8   Period Weeks   Status New     PT  LONG TERM GOAL #2   Title Improve TUG score to </= 13.5 secs with cane for decr. fall risk.  (12-24-15)   Baseline 17.78 on 10-25-15 with cane   Time 8   Period Weeks   Status New     PT LONG TERM GOAL #3   Title Increase L SLS to >/= 4 secs to demo improved balance.  (12-24-15)   Baseline L SLS = 2.53 secs;   Time 8   Period Weeks   Status New     PT LONG TERM GOAL #4   Title Amb. at least 500' with cane on paved outdoor surfaces with minimal c/o fatigue and no LOB.  (12-24-15)   Time 8   Period Weeks   Status New               Plan - 12/05/15 0909    Clinical Impression Statement Pt continues to have spasticity in bil. LE's and in trunk musc. - minimal long-term carryover achieved with stretching   Rehab Potential Good   PT Frequency 2x / week   PT Duration 8 weeks   PT Treatment/Interventions ADLs/Self Care Home Management;Gait training;Stair training;Functional mobility training;Therapeutic activities;Patient/family education;Neuromuscular re-education;Balance training;Therapeutic exercise;Orthotic Fit/Training;Passive range of motion   PT Next Visit Plan check STG's   Consulted and Agree with Plan of Care Patient      Patient will benefit from skilled therapeutic intervention in order to improve the following deficits and impairments:  Abnormal gait, Decreased activity tolerance, Decreased balance, Decreased coordination, Decreased range of motion, Decreased mobility, Decreased strength, Impaired flexibility, Increased muscle spasms, Impaired sensation, Impaired tone, Impaired UE functional use, Pain  Visit Diagnosis: Muscle weakness (generalized)  Other abnormalities of gait and mobility     Problem List Patient Active Problem List   Diagnosis Date Noted  . Orthostatic hypotension 11/30/2014  . C6 spinal cord injury (HCC) 11/20/2014  . Neurogenic bowel 11/20/2014  . Neurogenic bladder 11/20/2014  . Fever of unknown origin   . 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 C5 vertebral fracture (HCC) 11/09/2014  ZOXWRU, EAVWU [id number], PT 12/05/2015, 9:12 AM  [facility] [address] [place], [place], [address] Phone: [phone]   Fax:  [phone]  Name: Montrey Buist MRN: [id number] Date of Birth: 09-10-1950

## 2015-12-03 NOTE — Patient Instructions (Addendum)
Laying on your back hold a water bottle or 1-2 lb weight in your left hand , raise arm towards ceiling straightening elbow then lower  And  bend elbow,  2 sets of 10 reps,  With elbow straight raise arm up to shoulder height, make sure to keep shoulder blade in contact with the bed, only work in pain free range of motion. 2 sets of 10 reps  Sit edge of bed with hands behind you, squeeze shoulders back x10 reps

## 2015-12-06 ENCOUNTER — Ambulatory Visit: Payer: Medicare Other | Admitting: Occupational Therapy

## 2015-12-06 ENCOUNTER — Ambulatory Visit: Payer: Medicare Other | Admitting: Physical Therapy

## 2015-12-06 DIAGNOSIS — R29898 Other symptoms and signs involving the musculoskeletal system: Secondary | ICD-10-CM

## 2015-12-06 DIAGNOSIS — M6281 Muscle weakness (generalized): Secondary | ICD-10-CM

## 2015-12-06 DIAGNOSIS — R29818 Other symptoms and signs involving the nervous system: Secondary | ICD-10-CM

## 2015-12-06 DIAGNOSIS — M25612 Stiffness of left shoulder, not elsewhere classified: Secondary | ICD-10-CM

## 2015-12-06 DIAGNOSIS — R278 Other lack of coordination: Secondary | ICD-10-CM

## 2015-12-06 DIAGNOSIS — R2689 Other abnormalities of gait and mobility: Secondary | ICD-10-CM

## 2015-12-06 DIAGNOSIS — M79602 Pain in left arm: Secondary | ICD-10-CM

## 2015-12-06 DIAGNOSIS — M25642 Stiffness of left hand, not elsewhere classified: Secondary | ICD-10-CM

## 2015-12-06 NOTE — Therapy (Signed)
[facility] [address] [place], [place], [address] Phone: [phone]   Fax:  [phone]  Physical Therapy Treatment  Patient Details  Name: Carl [name] MRN: [id number] Date of Birth: [birth year] Referring Provider: Dr. Faith RogueZachary Swartz  Encounter Date: 12/06/2015      PT End of Session - 12/06/15 2013    Visit Number 7   Number of Visits 17   Date for PT Re-Evaluation 12/24/15   Authorization Type Medicare   Authorization Time Period 10-25-15 - 12-24-15   PT Start Time 1323   PT Stop Time 1402   PT Time Calculation (min) 39 min      Past Medical History:  Diagnosis Date  . Bradycardia   . Chronic abdominal pain   . Hesitancy of micturition     Past Surgical History:  Procedure Laterality Date  . ANTERIOR CERVICAL DECOMP/DISCECTOMY FUSION N/A 11/14/2014   Procedure: Cervical Five-Cervical Six  Anterior Cervical Decompression/Diskectomy/Fusion;  Surgeon: Maeola HarmanJoseph Stern, MD;  Location: [facility];  Service: Neurosurgery;  Laterality: N/A;    There were no vitals filed for this visit.      Subjective Assessment - 12/06/15 2008    Subjective Pt reports he feels that the tightness is getting worse; hasn't felt good this week and also reports L ankle has been swelling recently   Patient Stated Goals Increase LLE strength; decr. tightness in LLE   Currently in Pain? Yes   Pain Score 5                          [facility] Adult PT Treatment/Exercise - 12/06/15 1340      Lumbar Exercises: Stretches   Active Hamstring Stretch 1 rep;30 seconds   Lower Trunk Rotation 1 rep;20 seconds     Lumbar Exercises: Machines for Strengthening   Leg Press bil. LE's 60# 1 set 15 reps: LLE only 35# 2 sets 10 reps     Lumbar Exercises: Standing   Heel Raises 10 reps     Lumbar Exercises: Supine   Clam 10 reps  with moderate manual resistance   Straight Leg Raise 10 reps  2# weight on LLE; 4# on RLE      Lumbar Exercises: Sidelying   Hip Abduction 10 reps  no weight     SciFit level 4.0 x 3" with UE's and LE's  Prone position for stretching abdominals, L quad stretch with contract/relax Quadriped position - - sitting back on heels for stretching, then with arms to R side and hips to L side for trunk stretch  L hip abduction - with clockwise and counterclockwise circles - 5 reps each direction L hip abduction in R sidelying - hip flexion/ extension x 5 reps           PT Short Term Goals - 10/28/15 1607      PT SHORT TERM GOAL #1   Title Increase gait velocity to >/= 2.40 ft/sec with cane.  11-25-15   Baseline 2.03 ft/sec   Time 4   Period Weeks   Status New     PT SHORT TERM GOAL #2   Title Improve TUG score to </= 15 secs for reduced fall risk.  11-25-15   Baseline 17.78 secs on 10-25-15 with cane   Time 4   Period Weeks   Status New     PT SHORT TERM GOAL #3   Title Amb. 100' without device without occurrence of L toes  catching for incr. safety with household mobility.  11-25-15   Time 4   Period Weeks   Status New     PT SHORT TERM GOAL #4   Title Negotiate steps without hand rail step by step with SBA .  11-25-15   Time 4   Period Weeks   Status New     PT SHORT TERM GOAL #5   Title Independent in HEP for stretching and strengthening exs.  11-25-15   Time 4   Period Weeks   Status New           PT Long Term Goals - 10/28/15 1616      PT LONG TERM GOAL #1   Title Increase gait velocity to >/= 2.7 ft/sec with cane to demo increased gait efficiency.  (12-24-15)   Baseline 2.03 ft/sec with cane - 10-25-15   Time 8   Period Weeks   Status New     PT LONG TERM GOAL #2   Title Improve TUG score to </= 13.5 secs with cane for decr. fall risk.  (12-24-15)   Baseline 17.78 on 10-25-15 with cane   Time 8   Period Weeks   Status New     PT LONG TERM GOAL #3   Title Increase L SLS to >/= 4 secs to demo improved balance.  (12-24-15)   Baseline L SLS = 2.53 secs;    Time 8   Period Weeks   Status New     PT LONG TERM GOAL #4   Title Amb. at least 500' with cane on paved outdoor surfaces with minimal c/o fatigue and no LOB.  (12-24-15)   Time 8   Period Weeks   Status New               Plan - 12/06/15 2013    Clinical Impression Statement Pt reported minimal change in tightness in trunk and in lower extremities after PT session focused on stretching and strengthening exercises   Rehab Potential Good   PT Frequency 2x / week   PT Duration 8 weeks   PT Treatment/Interventions ADLs/Self Care Home Management;Gait training;Stair training;Functional mobility training;Therapeutic activities;Patient/family education;Neuromuscular re-education;Balance training;Therapeutic exercise;Orthotic Fit/Training;Passive range of motion   PT Next Visit Plan check STG's   PT Home Exercise Plan see above   Consulted and Agree with Plan of Care Patient      Patient will benefit from skilled therapeutic intervention in order to improve the following deficits and impairments:  Abnormal gait, Decreased activity tolerance, Decreased balance, Decreased coordination, Decreased range of motion, Decreased mobility, Decreased strength, Impaired flexibility, Increased muscle spasms, Impaired sensation, Impaired tone, Impaired UE functional use, Pain  Visit Diagnosis: Muscle weakness (generalized)  Other symptoms and signs involving the nervous system  Other abnormalities of gait and mobility     Problem List Patient Active Problem List   Diagnosis Date Noted  . Orthostatic hypotension 11/30/2014  . C6 spinal cord injury (HCC) 11/20/2014  . Neurogenic bowel 11/20/2014  . Neurogenic bladder 11/20/2014  . Fever of unknown origin   . 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 C5 vertebral fracture (HCC) 11/09/2014    Anays Detore, Donavan Burnet, PT 12/06/2015, 8:18 PM  [facility] [address] [place], [place], [address] Phone: [phone]   Fax:  [phone]  Name: Carl [name] MRN: [id number] Date of Birth: 12/23/1950

## 2015-12-06 NOTE — Therapy (Signed)
[facility] [address] [place], [place], [address] Phone: [phone]   Fax:  [phone]  Occupational Therapy Treatment  Patient Details  Name: Carl [name] MRN: [id number] Date of Birth: 04-30-1950 Referring Provider: Dr. Riley KillSwartz  Encounter Date: 12/06/2015      OT End of Session - 12/06/15 1421    Visit Number 7   Number of Visits 17   Date for OT Re-Evaluation 12/26/15   Authorization Type Medicare   Authorization - Visit Number 7   Authorization - Number of Visits 10   OT Start Time 1403   OT Stop Time 1445   OT Time Calculation (min) 42 min   Activity Tolerance Patient tolerated treatment well   Behavior During Therapy [facility] for tasks assessed/performed      Past Medical History:  Diagnosis Date  . Bradycardia   . Chronic abdominal pain   . Hesitancy of micturition     Past Surgical History:  Procedure Laterality Date  . ANTERIOR CERVICAL DECOMP/DISCECTOMY FUSION N/A 11/14/2014   Procedure: Cervical Five-Cervical Six  Anterior Cervical Decompression/Diskectomy/Fusion;  Surgeon: Maeola HarmanJoseph Stern, MD;  Location: [facility];  Service: Neurosurgery;  Laterality: N/A;    There were no vitals filed for this visit.      Subjective Assessment - 12/06/15 1416    Subjective  Pt reports incr stiffness/pain overall in whole LUE today   Pertinent History see Epic   Patient Stated Goals increase strength and use of LUe   Currently in Pain? Yes   Pain Score 5    Pain Location --  shoulder/L arm   Pain Orientation Left   Pain Descriptors / Indicators Aching;Sharp   Pain Type Chronic pain   Pain Onset More than a month ago   Pain Frequency Constant   Aggravating Factors  malpositioning   Pain Relieving Factors exercise       In supine,  Trunk ext, pect stretch by laying with towel along spine and UEs positioned in low range abduction with therapist facilitation stretch with gentle joint mobs to L shoulder  and myofascial release.  Pt reports pain down to 2-3/10 after this.  In supine, shoulder flex to mid-range and chest press with 2lb wt. x10 each.  In sitting, scapular depression/retraction with shoulders in ER and hands on mat and min cueing for trunk ext for posture x10.    Arm bike x 5 min level 1 for reciprocal movement without rest.    In standing, wall slides for shoulder flex stretch with BUEs.                 OT Treatments/Exercises (OP) - 12/06/15 0001      Modalities   Modalities Ultrasound     Ultrasound   Ultrasound Location L upper traps   Ultrasound Parameters 1.0wts/cm2 x808min, continuous with no adverse reactions   Ultrasound Goals Pain  tightness                OT Education - 12/06/15 1717    Education Details Recommended stretch with towel roll along spine in supine at home for improved posture   Person(s) Educated Patient   Methods Explanation;Demonstration;Verbal cues   Comprehension Verbalized understanding          OT Short Term Goals - 12/03/15 1437      OT SHORT TERM GOAL #1   Title Pt will verbalize understanding of LUE positioning to minimize pain and risk for contracture   Time 4  Period Weeks   Status Achieved     OT SHORT TERM GOAL #2   Title Pt will be independent with updated HEP    Time 4   Period Weeks   Status Achieved     OT SHORT TERM GOAL #3   Title Pt will demonstrate improved LUE shoulder flexion as evidenced by retrieving a lightweight object at 110 shoulder flexion with pain no greater than 3/10.   Time 4   Period Weeks   Status On-going  90-100 prior to pain     OT SHORT TERM GOAL #4   Title Pt will demonstrate at least 95% composite finger extension for LUE.   Time 4   Period Weeks   Status On-going  90%           OT Long Term Goals - 11/29/15 1517      OT LONG TERM GOAL #1   Title Pt will improve LUE fine motor coordination as evidenced by decreasing 9 hole peg test score to 45 secs  or less   Baseline 56.38 secs   Time 8   Status New     OT LONG TERM GOAL #2   Title Pt will increase LUE forearm pronation to 65* for increased LUE functional use.   Time 8   Period Weeks   Status New     OT LONG TERM GOAL #3   Title Pt will demonstrate improved LUE functional use as evidenced by increasing box/ blocks to 48 blocks.   Baseline LUE 44 blocks , RUE 52 blocks   Time 8   Period Weeks   Status New     OT LONG TERM GOAL #4   Title Pt will use LUE as a non dominant assist for ADLs/IADLs at least 50% of the time with pain no greater than 3/10   Time 8   Period Weeks   Status New               Plan - 12/06/15 1421    Clinical Impression Statement Pt reports incr overall pain/stiffness this week, but reports improvement by end of session 1/10 at rest (5/10 initially)   Rehab Potential Good   OT Frequency 2x / week   OT Duration 8 weeks   OT Treatment/Interventions Self-care/ADL training;Therapeutic exercise;Patient/family education;Balance training;Splinting;Manual Therapy;Neuromuscular education;Ultrasound;Energy conservation;Therapeutic exercises;Therapeutic activities;DME and/or AE instruction;Parrafin;Cryotherapy;Electrical Stimulation;Fluidtherapy;Passive range of motion;Contrast Bath;Moist Heat   Plan ultrasound, strength, ROM, stetching   OT Home Exercise Plan Education provided:  HEP, splint wear/care (custom L resting hand splint)   Consulted and Agree with Plan of Care Patient      Patient will benefit from skilled therapeutic intervention in order to improve the following deficits and impairments:  Abnormal gait, Decreased coordination, Decreased range of motion, Difficulty walking, Impaired flexibility, Impaired sensation, Decreased safety awareness, Decreased endurance, Decreased activity tolerance, Decreased knowledge of precautions, Impaired tone, Pain, Impaired UE functional use, Decreased knowledge of use of DME, Decreased balance, Decreased  mobility, Decreased strength  Visit Diagnosis: Muscle weakness (generalized)  Pain In Left Arm  Stiffness of left hand, not elsewhere classified  Other symptoms and signs involving the musculoskeletal system  Stiffness of left shoulder, not elsewhere classified  Other lack of coordination  Other symptoms and signs involving the nervous system    Problem List Patient Active Problem List   Diagnosis Date Noted  . Orthostatic hypotension 11/30/2014  . C6 spinal cord injury (HCC) 11/20/2014  . Neurogenic bowel 11/20/2014  . Neurogenic bladder 11/20/2014  .  Fever of unknown origin   . 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 C5 vertebral fracture [facility]) 11/09/2014    [facility] 12/06/2015, 5:19 PM  [facility] [address] [place], [place], [address] Phone: [phone]   Fax:  [phone]  Name: Carl [name] MRN: [id number] Date of Birth: 11-Feb-1951   Willa Frater, OTR/L [facility] [address] [place], [place]  [phone] phone [phone] 12/06/15 5:19 PM

## 2015-12-11 ENCOUNTER — Other Ambulatory Visit: Payer: Self-pay | Admitting: Physical Medicine & Rehabilitation

## 2015-12-11 ENCOUNTER — Ambulatory Visit: Payer: Medicare Other | Admitting: Physical Therapy

## 2015-12-11 ENCOUNTER — Ambulatory Visit: Payer: Medicare Other | Attending: Physical Medicine & Rehabilitation | Admitting: Occupational Therapy

## 2015-12-11 DIAGNOSIS — M6281 Muscle weakness (generalized): Secondary | ICD-10-CM | POA: Diagnosis present

## 2015-12-11 DIAGNOSIS — R29898 Other symptoms and signs involving the musculoskeletal system: Secondary | ICD-10-CM | POA: Diagnosis present

## 2015-12-11 DIAGNOSIS — M79602 Pain in left arm: Secondary | ICD-10-CM | POA: Diagnosis present

## 2015-12-11 DIAGNOSIS — R2689 Other abnormalities of gait and mobility: Secondary | ICD-10-CM | POA: Diagnosis present

## 2015-12-11 DIAGNOSIS — M25642 Stiffness of left hand, not elsewhere classified: Secondary | ICD-10-CM | POA: Insufficient documentation

## 2015-12-11 DIAGNOSIS — G8250 Quadriplegia, unspecified: Secondary | ICD-10-CM

## 2015-12-11 DIAGNOSIS — R29818 Other symptoms and signs involving the nervous system: Secondary | ICD-10-CM | POA: Insufficient documentation

## 2015-12-11 DIAGNOSIS — S14106S Unspecified injury at C6 level of cervical spinal cord, sequela: Secondary | ICD-10-CM

## 2015-12-11 DIAGNOSIS — K592 Neurogenic bowel, not elsewhere classified: Secondary | ICD-10-CM

## 2015-12-11 DIAGNOSIS — N319 Neuromuscular dysfunction of bladder, unspecified: Secondary | ICD-10-CM

## 2015-12-11 NOTE — Therapy (Signed)
[facility] [address] [place], [place], [address] Phone: [phone]   Fax:  [phone]  Physical Therapy Treatment  Patient Details  Name: Carl [name] MRN: [id number] Date of Birth: 1951-01-04 Referring Provider: Dr. Alger Simons  Encounter Date: 12/11/2015      PT End of Session - 12/11/15 1552    Visit Number 8   Number of Visits 17   Date for PT Re-Evaluation 12/24/15   Authorization Type Medicare   Authorization Time Period 10-25-15 - 12-24-15   PT Start Time 1316   PT Stop Time 1400   PT Time Calculation (min) 44 min      Past Medical History:  Diagnosis Date  . Bradycardia   . Chronic abdominal pain   . Hesitancy of micturition     Past Surgical History:  Procedure Laterality Date  . ANTERIOR CERVICAL DECOMP/DISCECTOMY FUSION N/A 11/14/2014   Procedure: Cervical Five-Cervical Six  Anterior Cervical Decompression/Diskectomy/Fusion;  Surgeon: Erline Levine, MD;  Location: [facility];  Service: Neurosurgery;  Laterality: N/A;    There were no vitals filed for this visit.      Subjective Assessment - 12/11/15 1552    Subjective Pt states he feels better today - did some stretches over the weekend   Patient Stated Goals Increase LLE strength; decr. tightness in LLE     TherAct: Pt performed floor to stand transfer with UE support on mat table - with CGA; able to transfer down to floor with SBA  Worked on partial squats from standing with bending down to pick up cones off floor:  Then picked up cones toppled over,  then picked up stop watch without difficulty   Neuro Re-ed;  Tall kneeling - sitting back on heels for quad stretching - 30 sec hold;  Weight shifting by moving RLE up/back and laterally - 10 reps each TUG score 14.63 secs with cane  Gait; gait velocity 15.62 secs =  2.10 ft/sec with cane Pt gait trained 120' around track without device with 1 occurrence of L toes catching (in  curve of track)   Pt negotiated steps without hand rails step by step sequence with SBA   TherEx; sit to stand x 10 reps without UE support with R foot on balance bubble L hip extension and hip flexion with knee flexion with red theraband x 10 reps each in standing                [facility] Adult PT Treatment/Exercise - 12/11/15 1544      Ambulation/Gait   Stairs Yes   Stairs Assistance 5: Supervision   Number of Stairs 4   Height of Stairs 6                  PT Short Term Goals - 12/11/15 1316      PT SHORT TERM GOAL #1   Title Increase gait velocity to >/= 2.40 ft/sec with cane.  11-25-15   Baseline   2.03 ft/sec on 10-25-15:     15.62 secs = 2.10 ft/sec on 12-11-15   Time 4   Period Weeks   Status On-going     PT SHORT TERM GOAL #2   Title Improve TUG score to </= 15 secs for reduced fall risk.  11-25-15   Baseline 14.63 secs   Time 4   Period Weeks   Status Achieved     PT SHORT TERM GOAL #3   Title Amb. 100' without device without  occurrence of L toes catching for incr. safety with household mobility.  11-25-15   Baseline 1 occurrence of L toes catching - in curve of track -- 12-11-15   Time 4   Period Weeks   Status On-going     PT SHORT TERM GOAL #4   Title Negotiate steps without hand rail step by step with SBA .  11-25-15   Baseline met 12-11-15   Status Achieved     PT SHORT TERM GOAL #5   Title Independent in HEP for stretching and strengthening exs.  11-25-15   Status Achieved           PT Long Term Goals - 10/28/15 1616      PT LONG TERM GOAL #1   Title Increase gait velocity to >/= 2.7 ft/sec with cane to demo increased gait efficiency.  (12-24-15)   Baseline 2.03 ft/sec with cane - 10-25-15   Time 8   Period Weeks   Status New     PT LONG TERM GOAL #2   Title Improve TUG score to </= 13.5 secs with cane for decr. fall risk.  (12-24-15)   Baseline 17.78 on 10-25-15 with cane   Time 8   Period Weeks   Status New     PT LONG TERM GOAL  #3   Title Increase L SLS to >/= 4 secs to demo improved balance.  (12-24-15)   Baseline L SLS = 2.53 secs;   Time 8   Period Weeks   Status New     PT LONG TERM GOAL #4   Title Amb. at least 500' with cane on paved outdoor surfaces with minimal c/o fatigue and no LOB.  (12-24-15)   Time 8   Period Weeks   Status New               Plan - 12/11/15 1553    Clinical Impression Statement Pt has met STG's #2,4,5:  #1 and #3 are partially met - are ongoing for next 4 weeks; these goals not met due to decreased balance and inceased spasticity in trunk and LE's   Rehab Potential Good   PT Frequency 2x / week   PT Duration 8 weeks   PT Treatment/Interventions ADLs/Self Care Home Management;Gait training;Stair training;Functional mobility training;Therapeutic activities;Patient/family education;Neuromuscular re-education;Balance training;Therapeutic exercise;Orthotic Fit/Training;Passive range of motion   PT Next Visit Plan cont LE stretching and strengthening   PT Home Exercise Plan see above   Consulted and Agree with Plan of Care Patient      Patient will benefit from skilled therapeutic intervention in order to improve the following deficits and impairments:  Abnormal gait, Decreased activity tolerance, Decreased balance, Decreased coordination, Decreased range of motion, Decreased mobility, Decreased strength, Impaired flexibility, Increased muscle spasms, Impaired sensation, Impaired tone, Impaired UE functional use, Pain  Visit Diagnosis: Muscle weakness (generalized)  Other symptoms and signs involving the nervous system     Problem List Patient Active Problem List   Diagnosis Date Noted  . Orthostatic hypotension 11/30/2014  . C6 spinal cord injury ([place]) 11/20/2014  . Neurogenic bowel 11/20/2014  . Neurogenic bladder 11/20/2014  . Fever of unknown origin   . 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 C5 vertebral fracture ([place]) 11/09/2014    Elveta Rape, [place],  PT 12/11/2015, 4:00 PM  [facility] [address] [place], [place], [address] Phone: [phone]   Fax:  [phone]  Name: Carl [name] MRN: [id number] Date of Birth: [birth year]

## 2015-12-11 NOTE — Therapy (Signed)
[facility] [address] [place], [place], [address] Phone: [phone]   Fax:  [phone]  Occupational Therapy Treatment  Patient Details  Name: Tanyon Alipio MRN: [id number] Date of Birth: 02/15/1951 Referring Provider: Dr. Riley Kill  Encounter Date: 12/11/2015    Past Medical History:  Diagnosis Date  . Bradycardia   . Chronic abdominal pain   . Hesitancy of micturition     Past Surgical History:  Procedure Laterality Date  . ANTERIOR CERVICAL DECOMP/DISCECTOMY FUSION N/A 11/14/2014   Procedure: Cervical Five-Cervical Six  Anterior Cervical Decompression/Diskectomy/Fusion;  Surgeon: Maeola Harman, MD;  Location: [facility];  Service: Neurosurgery;  Laterality: N/A;    There were no vitals filed for this visit.                  In supine,  Trunk ext, pect stretch by laying with towel along spine and UEs positioned in low range abduction with therapist facilitation stretch with gentle joint mobs to L shoulder    In supine, shoulder flex to mid-range and chest press with 2lb wt. x10 each while continuing    Prone on elbows for scapular scapular retraction, then quadraped for cat/ cow positions   Arm bike x 5 min level 1 for reciprocal movement without rest.     In standing, wall slides for shoulder flex stretch with BUEs.                  OT Short Term Goals - 12/03/15 1437      OT SHORT TERM GOAL #1   Title Pt will verbalize understanding of LUE positioning to minimize pain and risk for contracture   Time 4   Period Weeks   Status Achieved     OT SHORT TERM GOAL #2   Title Pt will be independent with updated HEP    Time 4   Period Weeks   Status Achieved     OT SHORT TERM GOAL #3   Title Pt will demonstrate improved LUE shoulder flexion as evidenced by retrieving a lightweight object at 110 shoulder flexion with pain no greater than 3/10.   Time 4   Period Weeks   Status  On-going  90-100 prior to pain     OT SHORT TERM GOAL #4   Title Pt will demonstrate at least 95% composite finger extension for LUE.   Time 4   Period Weeks   Status On-going  90%           OT Long Term Goals - 11/29/15 1517      OT LONG TERM GOAL #1   Title Pt will improve LUE fine motor coordination as evidenced by decreasing 9 hole peg test score to 45 secs or less   Baseline 56.38 secs   Time 8   Status New     OT LONG TERM GOAL #2   Title Pt will increase LUE forearm pronation to 65* for increased LUE functional use.   Time 8   Period Weeks   Status New     OT LONG TERM GOAL #3   Title Pt will demonstrate improved LUE functional use as evidenced by increasing box/ blocks to 48 blocks.   Baseline LUE 44 blocks , RUE 52 blocks   Time 8   Period Weeks   Status New     OT LONG TERM GOAL #4   Title Pt will use LUE as a non dominant assist for ADLs/IADLs at least 50%  of the time with pain no greater than 3/10   Time 8   Period Weeks   Status New             Patient will benefit from skilled therapeutic intervention in order to improve the following deficits and impairments:     Visit Diagnosis: No diagnosis found.    Problem List Patient Active Problem List   Diagnosis Date Noted  . Orthostatic hypotension 11/30/2014  . C6 spinal cord injury (HCC) 11/20/2014  . Neurogenic bowel 11/20/2014  . Neurogenic bladder 11/20/2014  . Fever of unknown origin   . 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 C5 vertebral fracture (HCC) 11/09/2014    RINE,KATHRYN 12/11/2015, 2:30 PM Keene BreathKathryn Rine, OTR/L [phone] Phone: [phone] 2:30 PM 12/11/15 [facility] [address] [place], [place], [address] Phone: [phone]   Fax:  [phone]  Name: Benancio Deedsony Mccaslin MRN: [id number] Date of Birth: [birth year]

## 2015-12-13 ENCOUNTER — Ambulatory Visit: Payer: Medicare Other | Admitting: Physical Therapy

## 2015-12-13 ENCOUNTER — Ambulatory Visit: Payer: Medicare Other | Admitting: Occupational Therapy

## 2015-12-13 DIAGNOSIS — M6281 Muscle weakness (generalized): Secondary | ICD-10-CM | POA: Diagnosis not present

## 2015-12-13 DIAGNOSIS — R29818 Other symptoms and signs involving the nervous system: Secondary | ICD-10-CM

## 2015-12-13 DIAGNOSIS — M79602 Pain in left arm: Secondary | ICD-10-CM

## 2015-12-13 DIAGNOSIS — M25642 Stiffness of left hand, not elsewhere classified: Secondary | ICD-10-CM

## 2015-12-14 NOTE — Therapy (Signed)
[facility] [address] [place], [place], [address] Phone: [phone]   Fax:  [phone]  Occupational Therapy Treatment  Patient Details  Name: Carl [name] MRN: [id number] Date of Birth: 28-Apr-1950 Referring Provider: Dr. Riley Kill  Encounter Date: 12/13/2015      OT End of Session - 12/14/15 0826    Visit Number 9   Number of Visits 17   Date for OT Re-Evaluation 12/26/15   Authorization Type Medicare   Authorization - Visit Number 9   Authorization - Number of Visits 10   OT Start Time 1404   OT Stop Time 1445   OT Time Calculation (min) 41 min   Activity Tolerance Patient tolerated treatment well   Behavior During Therapy [facility] for tasks assessed/performed      Past Medical History:  Diagnosis Date  . Bradycardia   . Chronic abdominal pain   . Hesitancy of micturition     Past Surgical History:  Procedure Laterality Date  . ANTERIOR CERVICAL DECOMP/DISCECTOMY FUSION N/A 11/14/2014   Procedure: Cervical Five-Cervical Six  Anterior Cervical Decompression/Diskectomy/Fusion;  Surgeon: Maeola Harman, MD;  Location: [facility];  Service: Neurosurgery;  Laterality: N/A;    There were no vitals filed for this visit.         Treatment: Ultrasound , 0.8 w/cm2, 100% x 8 mins to left upper lateral arm for tightness and pain, no adverse reactions. Yellow theraband exercises for low range shoulder flexion and then extension 15 reps each, min v.c. Pt attempted internal/ external rotation however this increased pain therefore this was discontinued. flipping playing cards and dealing with thumb for increased LUE functional use. Arm bike x 6 mins level 1 for conditioning/ reciprocal movements. Ultrasound to palm and digits of LUE due to tightness, , 0.8 w/cm2, 20% x 8 mins no adnverse reactions. Discussed overall progress. Pt to consider d/c next week.                    OT Short Term Goals  - 12/03/15 1437      OT SHORT TERM GOAL #1   Title Pt will verbalize understanding of LUE positioning to minimize pain and risk for contracture   Time 4   Period Weeks   Status Achieved     OT SHORT TERM GOAL #2   Title Pt will be independent with updated HEP    Time 4   Period Weeks   Status Achieved     OT SHORT TERM GOAL #3   Title Pt will demonstrate improved LUE shoulder flexion as evidenced by retrieving a lightweight object at 110 shoulder flexion with pain no greater than 3/10.   Time 4   Period Weeks   Status On-going  90-100 prior to pain     OT SHORT TERM GOAL #4   Title Pt will demonstrate at least 95% composite finger extension for LUE.   Time 4   Period Weeks   Status On-going  90%           OT Long Term Goals - 11/29/15 1517      OT LONG TERM GOAL #1   Title Pt will improve LUE fine motor coordination as evidenced by decreasing 9 hole peg test score to 45 secs or less   Baseline 56.38 secs   Time 8   Status New     OT LONG TERM GOAL #2   Title Pt will increase LUE forearm pronation to 65* for increased  LUE functional use.   Time 8   Period Weeks   Status New     OT LONG TERM GOAL #3   Title Pt will demonstrate improved LUE functional use as evidenced by increasing box/ blocks to 48 blocks.   Baseline LUE 44 blocks , RUE 52 blocks   Time 8   Period Weeks   Status New     OT LONG TERM GOAL #4   Title Pt will use LUE as a non dominant assist for ADLs/IADLs at least 50% of the time with pain no greater than 3/10   Time 8   Period Weeks   Status New               Plan - 12/14/15 16100824    Clinical Impression Statement Pt is progressing slowly limited by pain in shoulder.   Rehab Potential Good   OT Frequency 2x / week   OT Duration 8 weeks   OT Treatment/Interventions Self-care/ADL training;Therapeutic exercise;Patient/family education;Balance training;Splinting;Manual Therapy;Neuromuscular education;Ultrasound;Energy  conservation;Therapeutic exercises;Therapeutic activities;DME and/or AE instruction;Parrafin;Cryotherapy;Electrical Stimulation;Fluidtherapy;Passive range of motion;Contrast Bath;Moist Heat   Plan ultrasound, strength, consider d/c next week or the following week.   OT Home Exercise Plan Education provided:  HEP, splint wear/care (custom L resting hand splint)   Consulted and Agree with Plan of Care Patient      Patient will benefit from skilled therapeutic intervention in order to improve the following deficits and impairments:  Abnormal gait, Decreased coordination, Decreased range of motion, Difficulty walking, Impaired flexibility, Impaired sensation, Decreased safety awareness, Decreased endurance, Decreased activity tolerance, Decreased knowledge of precautions, Impaired tone, Pain, Impaired UE functional use, Decreased knowledge of use of DME, Decreased balance, Decreased mobility, Decreased strength  Visit Diagnosis: Muscle weakness (generalized)  Other symptoms and signs involving the nervous system  Pain In Left Arm  Stiffness of left hand, not elsewhere classified    Problem List Patient Active Problem List   Diagnosis Date Noted  . Orthostatic hypotension 11/30/2014  . C6 spinal cord injury (HCC) 11/20/2014  . Neurogenic bowel 11/20/2014  . Neurogenic bladder 11/20/2014  . Fever of unknown origin   . 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 C5 vertebral fracture (HCC) 11/09/2014    Emillie Chasen 12/14/2015, 8:27 AM Keene BreathKathryn Jahni Paul, OTR/L [phone] Phone: [phone] 8:27 AM 12/14/15 [facility] [address] [place], [place], [address] Phone: [phone]   Fax:  [phone]  Name: Carl [name] MRN: [id number] Date of Birth: [birth year]

## 2015-12-16 NOTE — Therapy (Signed)
[facility] [address] [place], [place], [address] Phone: [phone]   Fax:  [phone]  Physical Therapy Treatment  Patient Details  Name: Carl [name] MRN: [id number] Date of Birth: [birth year] Referring Provider: Dr. Alger Simons  Encounter Date: 12/13/2015      PT End of Session - 12/16/15 2055    Visit Number 9   Number of Visits 17   Date for PT Re-Evaluation 12/24/15   Authorization Type Medicare   Authorization Time Period 10-25-15 - 12-24-15   PT Start Time 1315   PT Stop Time 1400   PT Time Calculation (min) 45 min      Past Medical History:  Diagnosis Date  . Bradycardia   . Chronic abdominal pain   . Hesitancy of micturition     Past Surgical History:  Procedure Laterality Date  . ANTERIOR CERVICAL DECOMP/DISCECTOMY FUSION N/A 11/14/2014   Procedure: Cervical Five-Cervical Six  Anterior Cervical Decompression/Diskectomy/Fusion;  Surgeon: Erline Levine, MD;  Location: [facility];  Service: Neurosurgery;  Laterality: N/A;    There were no vitals filed for this visit.      Subjective Assessment - 12/16/15 2052    Subjective Pt reports no changes or problems since last visit   Patient Stated Goals Increase LLE strength; decr. tightness in LLE                         [facility] Adult PT Treatment/Exercise - 12/16/15 0001      Lumbar Exercises: Stretches   Active Hamstring Stretch 1 rep;30 seconds   Prone on Elbows Stretch 1 rep;30 seconds   Prone on Elbows Stretch Limitations tightness in abdominals   Quadruped Mid Back Stretch 1 rep;20 seconds     Lumbar Exercises: Machines for Strengthening   Leg Press bil. LE's 60# 1 set 15 reps: LLE only 35# 2 sets 10 reps     Lumbar Exercises: Standing   Heel Raises 10 reps     Lumbar Exercises: Supine   Clam 10 reps  with moderate manual resistance   Bridge 10 reps   Straight Leg Raise 10 reps  2# weight on LLE; 4# on RLE    Other Supine Lumbar Exercises 1/2 bridge LLE 10 reps     Lumbar Exercises: Sidelying   Hip Abduction 10 reps  no weight   Hip Abduction Weights (lbs) 2#     Knee/Hip Exercises: Stretches   Gastroc Stretch Both;1 rep;30 seconds                  PT Short Term Goals - 12/11/15 1316      PT SHORT TERM GOAL #1   Title Increase gait velocity to >/= 2.40 ft/sec with cane.  11-25-15   Baseline   2.03 ft/sec on 10-25-15:     15.62 secs = 2.10 ft/sec on 12-11-15   Time 4   Period Weeks   Status On-going     PT SHORT TERM GOAL #2   Title Improve TUG score to </= 15 secs for reduced fall risk.  11-25-15   Baseline 14.63 secs   Time 4   Period Weeks   Status Achieved     PT SHORT TERM GOAL #3   Title Amb. 100' without device without occurrence of L toes catching for incr. safety with household mobility.  11-25-15   Baseline 1 occurrence of L toes catching - in curve of track -- 12-11-15   Time  4   Period Weeks   Status On-going     PT SHORT TERM GOAL #4   Title Negotiate steps without hand rail step by step with SBA .  11-25-15   Baseline met 12-11-15   Status Achieved     PT SHORT TERM GOAL #5   Title Independent in HEP for stretching and strengthening exs.  11-25-15   Status Achieved           PT Long Term Goals - 10/28/15 1616      PT LONG TERM GOAL #1   Title Increase gait velocity to >/= 2.7 ft/sec with cane to demo increased gait efficiency.  (12-24-15)   Baseline 2.03 ft/sec with cane - 10-25-15   Time 8   Period Weeks   Status New     PT LONG TERM GOAL #2   Title Improve TUG score to </= 13.5 secs with cane for decr. fall risk.  (12-24-15)   Baseline 17.78 on 10-25-15 with cane   Time 8   Period Weeks   Status New     PT LONG TERM GOAL #3   Title Increase L SLS to >/= 4 secs to demo improved balance.  (12-24-15)   Baseline L SLS = 2.53 secs;   Time 8   Period Weeks   Status New     PT LONG TERM GOAL #4   Title Amb. at least 500' with cane on paved outdoor  surfaces with minimal c/o fatigue and no LOB.  (12-24-15)   Time 8   Period Weeks   Status New               Plan - 12/16/15 2055    Clinical Impression Statement Pt continues to have tightness/spasticity in trunk musculature and in LLE with min. L knee flexion in swing phase of gait; minimal carryover achieved with stretching   Rehab Potential Good   PT Frequency 2x / week   PT Duration 8 weeks   PT Treatment/Interventions ADLs/Self Care Home Management;Gait training;Stair training;Functional mobility training;Therapeutic activities;Patient/family education;Neuromuscular re-education;Balance training;Therapeutic exercise;Orthotic Fit/Training;Passive range of motion   PT Next Visit Plan cont LE stretching and strengthening   Consulted and Agree with Plan of Care Patient      Patient will benefit from skilled therapeutic intervention in order to improve the following deficits and impairments:  Abnormal gait, Decreased activity tolerance, Decreased balance, Decreased coordination, Decreased range of motion, Decreased mobility, Decreased strength, Impaired flexibility, Increased muscle spasms, Impaired sensation, Impaired tone, Impaired UE functional use, Pain  Visit Diagnosis: Other symptoms and signs involving the nervous system  Muscle weakness (generalized)     Problem List Patient Active Problem List   Diagnosis Date Noted  . Orthostatic hypotension 11/30/2014  . C6 spinal cord injury ([place]) 11/20/2014  . Neurogenic bowel 11/20/2014  . Neurogenic bladder 11/20/2014  . Fever of unknown origin   . 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 C5 vertebral fracture ([place]) 11/09/2014    Bonne Whack, [place], PT 12/16/2015, 8:58 PM  [facility] [address] [place], [place], [address] Phone: [phone]   Fax:  [phone]  Name: Carl [name] MRN: [id number] Date of Birth: 1950-08-16

## 2015-12-18 ENCOUNTER — Ambulatory Visit: Payer: Medicare Other | Admitting: Physical Therapy

## 2015-12-18 ENCOUNTER — Ambulatory Visit: Payer: Medicare Other | Admitting: Occupational Therapy

## 2015-12-18 DIAGNOSIS — M6281 Muscle weakness (generalized): Secondary | ICD-10-CM

## 2015-12-18 DIAGNOSIS — R2689 Other abnormalities of gait and mobility: Secondary | ICD-10-CM

## 2015-12-18 DIAGNOSIS — R29898 Other symptoms and signs involving the musculoskeletal system: Secondary | ICD-10-CM

## 2015-12-18 DIAGNOSIS — R29818 Other symptoms and signs involving the nervous system: Secondary | ICD-10-CM

## 2015-12-18 DIAGNOSIS — M25642 Stiffness of left hand, not elsewhere classified: Secondary | ICD-10-CM

## 2015-12-18 DIAGNOSIS — M79602 Pain in left arm: Secondary | ICD-10-CM

## 2015-12-18 NOTE — Patient Instructions (Addendum)
  Strengthening: Resisted Flexion   Hold tubing with ___left__ arm(s) at side. Pull forward and up. Move shoulder through pain-free range of motion. Repeat __10__ times per set.  Do _1-2_ sessions per day , every other day   Strengthening: Resisted Extension   Hold tubing in ___left__ hand(s), arm forward. Pull arm back, elbow straight. Repeat _10___ times per set. Do _1-2___ sessions per day, every other day.    Kinesiotape- you can wear kinesiotape for 3-5 days if no problems, it irritation noted- wet tape and remove slowly,  wash gently over tape and pat dry.

## 2015-12-19 NOTE — Therapy (Addendum)
[facility] [address] [place], [place], [address] Phone: [phone]   Fax:  [phone]  Occupational Therapy Treatment  Patient Details  Name: Carl [name] MRN: [id number] Date of Birth: 07-Oct-1950 Referring Provider: Dr. Naaman Plummer  Encounter Date: 12/18/2015      OT End of Session - 12/18/15 1431    Visit Number 10   Number of Visits 17   Date for OT Re-Evaluation 12/26/15   Authorization Type Medicare   Authorization - Visit Number 10   Authorization - Number of Visits 10   OT Start Time 7414   OT Stop Time 1445   OT Time Calculation (min) 40 min   Activity Tolerance Patient tolerated treatment well   Behavior During Therapy [facility] for tasks assessed/performed      Past Medical History:  Diagnosis Date  . Bradycardia   . Chronic abdominal pain   . Hesitancy of micturition     Past Surgical History:  Procedure Laterality Date  . ANTERIOR CERVICAL DECOMP/DISCECTOMY FUSION N/A 11/14/2014   Procedure: Cervical Five-Cervical Six  Anterior Cervical Decompression/Diskectomy/Fusion;  Surgeon: Erline Levine, MD;  Location: [facility];  Service: Neurosurgery;  Laterality: N/A;    There were no vitals filed for this visit.      Subjective Assessment - 12/18/15 1429    Subjective  Pt reports ultrasound helped last week   Pertinent History see Epic   Patient Stated Goals increase strength and use of LUe   Currently in Pain? Yes   Pain Score 4    Pain Location Back   Pain Orientation Lower   Pain Descriptors / Indicators Tightness   Pain Type Chronic pain   Pain Frequency Intermittent   Aggravating Factors  malpositioning   Pain Relieving Factors reposistioning   Multiple Pain Sites No            Ultrasound to left upper lateral arm 1 mhz, continuous, 0.8 w/cm 2, x 8 mins, no adverse reactions. Kinesiotape applied to left lateral arm/ shoulder for pain relief. Pt was educated in wear, care and  precautions. Pt was instructed in low range yellow theraband HEP, 15 reps each, no adverse reactions.                   OT Education - 12/19/15 1711    Education provided Yes   Education Details yellow theraband   Person(s) Educated Patient   Methods Explanation;Demonstration;Verbal cues;Handout   Comprehension Returned demonstration;Verbal cues required          OT Short Term Goals - 12/03/15 1437      OT SHORT TERM GOAL #1   Title Pt will verbalize understanding of LUE positioning to minimize pain and risk for contracture   Time 4   Period Weeks   Status Achieved     OT SHORT TERM GOAL #2   Title Pt will be independent with updated HEP    Time 4   Period Weeks   Status Achieved     OT SHORT TERM GOAL #3   Title Pt will demonstrate improved LUE shoulder flexion as evidenced by retrieving a lightweight object at 110 shoulder flexion with pain no greater than 3/10.   Time 4   Period Weeks   Status On-going  90-100 prior to pain     OT SHORT TERM GOAL #4   Title Pt will demonstrate at least 95% composite finger extension for LUE.   Time 4   Period Weeks  Status On-going  90%           OT Long Term Goals - 12/19/15 1440      OT LONG TERM GOAL #1   Title Pt will improve LUE fine motor coordination as evidenced by decreasing 9 hole peg test score to 45 secs or less   Time 8   Period Weeks   Status Achieved     OT LONG TERM GOAL #2   Title Pt will increase LUE forearm pronation to 65* for increased LUE functional use.   Status On-going     OT LONG TERM GOAL #3   Title Pt will demonstrate improved LUE functional use as evidenced by increasing box/ blocks to 48 blocks.   Time 8   Period Weeks   Status On-going     OT LONG TERM GOAL #4   Title Pt will use LUE as a non dominant assist for ADLs/IADLs at least 50% of the time with pain no greater than 3/10   Time 8   Status On-going               Plan - 12-19-15 1430    Clinical  Impression Statement Pt demonstrates overall progress. He reports shoulder pain is better today. Pt demonstrates improved LUE fine motor coordination.   Rehab Potential Good   OT Frequency 2x / week   OT Duration 8 weeks   OT Treatment/Interventions Self-care/ADL training;Therapeutic exercise;Patient/family education;Balance training;Splinting;Manual Therapy;Neuromuscular education;Ultrasound;Energy conservation;Therapeutic exercises;Therapeutic activities;DME and/or AE instruction;Parrafin;Cryotherapy;Electrical Stimulation;Fluidtherapy;Passive range of motion;Contrast Bath;Moist Heat   Plan ultrasound, check on kinesiotape   Consulted and Agree with Plan of Care Patient      Patient will benefit from skilled therapeutic intervention in order to improve the following deficits and impairments:  Abnormal gait, Decreased coordination, Decreased range of motion, Difficulty walking, Impaired flexibility, Impaired sensation, Decreased safety awareness, Decreased endurance, Decreased activity tolerance, Decreased knowledge of precautions, Impaired tone, Pain, Impaired UE functional use, Decreased knowledge of use of DME, Decreased balance, Decreased mobility, Decreased strength  Visit Diagnosis: Other symptoms and signs involving the nervous system  Muscle weakness (generalized)  Pain In Left Arm  Stiffness of left hand, not elsewhere classified  Other symptoms and signs involving the musculoskeletal system      G-Codes - 2015-12-19 1439    Functional Assessment Tool Used 9 hole peg test LUE 42.81   Functional Limitation Carrying, moving and handling objects   Carrying, Moving and Handling Objects Current Status [phone]) At least 1 percent but less than 20 percent impaired, limited or restricted   Carrying, Moving and Handling Objects Goal Status (X4801) At least 1 percent but less than 20 percent impaired, limited or restricted    Occupational Therapy Progress Note  Dates of Reporting  Period: 10/25/15 to 12/19/2015  Objective Reports of Subjective Statement: see above  Objective Measurements:  9 hole peg test LUE 42.81 secs  Goal Update:  Pt met 2/4 short term goals and he is progressing towards long term goals.  Plan: Pt can benefit from continued OT to address LUE pain, ROM and strength.  Reason Skilled Services are Required: Pt can benefit from skilled OT to address LUE pain, strength, ROM and coordination in order to maximize safety and independence with ADLS/IADLS and to maintain quality of life.  Problem List Patient Active Problem List   Diagnosis Date Noted  . Orthostatic hypotension 11/30/2014  . C6 spinal cord injury ([place]) 11/20/2014  . Neurogenic bowel 11/20/2014  . Neurogenic bladder 11/20/2014  .  Fever of unknown origin   . 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 C5 vertebral fracture ([place]) 11/09/2014    RINE,KATHRYN 12/19/2015, 5:20 PM Theone Murdoch, OTR/L [phone] Phone: [phone] 5:20 PM 12/19/15 [facility] [address] [place], [place], [address] Phone: [phone]   Fax:  [phone]  Name: Carl [name] MRN: [id number] Date of Birth: 1950/10/06

## 2015-12-19 NOTE — Therapy (Signed)
[facility] [address] [place], [place], [address] Phone: [phone]   Fax:  [phone]  Physical Therapy Treatment  Patient Details  Name: Carl [name] MRN: [id number] Date of Birth: 09-28-1950 Referring Provider: Dr. Alger Simons  Encounter Date: 12/18/2015      PT End of Session - 12/19/15 1913    Visit Number 10   Number of Visits 17   Date for PT Re-Evaluation 12/24/15   Authorization Type Medicare   Authorization Time Period 10-25-15 - 12-24-15   PT Start Time 1315   PT Stop Time 1400   PT Time Calculation (min) 45 min      Past Medical History:  Diagnosis Date  . Bradycardia   . Chronic abdominal pain   . Hesitancy of micturition     Past Surgical History:  Procedure Laterality Date  . ANTERIOR CERVICAL DECOMP/DISCECTOMY FUSION N/A 11/14/2014   Procedure: Cervical Five-Cervical Six  Anterior Cervical Decompression/Diskectomy/Fusion;  Surgeon: Erline Levine, MD;  Location: [facility];  Service: Neurosurgery;  Laterality: N/A;    There were no vitals filed for this visit.      Subjective Assessment - 12/19/15 1909    Subjective Pt reports stiffness today - says L leg has been dragging   Patient Stated Goals Increase LLE strength; decr. tightness in LLE   Currently in Pain? Yes   Pain Score 4    Pain Location Back   Pain Orientation Lower   Pain Descriptors / Indicators Tightness   Pain Type Chronic pain   Pain Onset More than a month ago   Pain Frequency Intermittent   Multiple Pain Sites No                         [facility] Adult PT Treatment/Exercise - 12/19/15 0001      Lumbar Exercises: Stretches   Active Hamstring Stretch 1 rep;30 seconds     Lumbar Exercises: Aerobic   Stationary Bike SciFit level 4.0 x 5" with UE's and LE's     Lumbar Exercises: Machines for Strengthening   Leg Press bil. LE's 60# 1 set 15 reps: LLE only 35# 2 sets 10 reps     Lumbar  Exercises: Standing   Heel Raises 10 reps   Functional Squats 10 reps  on BOSU                  PT Short Term Goals - 12/19/15 1922      PT SHORT TERM GOAL #1   Title Increase gait velocity to >/= 2.40 ft/sec with cane.  11-25-15   Baseline   2.03 ft/sec on 10-25-15:     15.62 secs = 2.10 ft/sec on 12-11-15   Time 4   Period Weeks   Status On-going     PT SHORT TERM GOAL #2   Title Improve TUG score to </= 15 secs for reduced fall risk.  11-25-15   Baseline 14.63 secs on 12-11-15   Time 4   Period Weeks   Status Achieved     PT SHORT TERM GOAL #3   Title Amb. 100' without device without occurrence of L toes catching for incr. safety with household mobility.  11-25-15   Baseline 1 occurrence of L toes catching - in curve of track -- 12-11-15   Time 4   Period Weeks   Status On-going     PT SHORT TERM GOAL #4   Title Negotiate steps without hand  rail step by step with SBA .  11-25-15   Baseline met 12-11-15   Time 4   Period Weeks   Status Achieved     PT SHORT TERM GOAL #5   Title Independent in HEP for stretching and strengthening exs.  11-25-15   Status Achieved           PT Long Term Goals - 10/28/15 1616      PT LONG TERM GOAL #1   Title Increase gait velocity to >/= 2.7 ft/sec with cane to demo increased gait efficiency.  (12-24-15)   Baseline 2.03 ft/sec with cane - 10-25-15   Time 8   Period Weeks   Status New     PT LONG TERM GOAL #2   Title Improve TUG score to </= 13.5 secs with cane for decr. fall risk.  (12-24-15)   Baseline 17.78 on 10-25-15 with cane   Time 8   Period Weeks   Status New     PT LONG TERM GOAL #3   Title Increase L SLS to >/= 4 secs to demo improved balance.  (12-24-15)   Baseline L SLS = 2.53 secs;   Time 8   Period Weeks   Status New     PT LONG TERM GOAL #4   Title Amb. at least 500' with cane on paved outdoor surfaces with minimal c/o fatigue and no LOB.  (12-24-15)   Time 8   Period Weeks   Status New                Plan - 12/19/15 1919    Clinical Impression Statement Pt. met LTg's #2,4, and 5:  #1 and #3 - improving but did not meet stated goal   Rehab Potential Good   PT Frequency 2x / week   PT Duration 8 weeks   PT Treatment/Interventions ADLs/Self Care Home Management;Gait training;Stair training;Functional mobility training;Therapeutic activities;Patient/family education;Neuromuscular re-education;Balance training;Therapeutic exercise;Orthotic Fit/Training;Passive range of motion   PT Next Visit Plan cont LE stretching and strengthening   PT Home Exercise Plan see above   Consulted and Agree with Plan of Care Patient      Patient will benefit from skilled therapeutic intervention in order to improve the following deficits and impairments:  Abnormal gait, Decreased activity tolerance, Decreased balance, Decreased coordination, Decreased range of motion, Decreased mobility, Decreased strength, Impaired flexibility, Increased muscle spasms, Impaired sensation, Impaired tone, Impaired UE functional use, Pain  Visit Diagnosis: Other abnormalities of gait and mobility  Muscle weakness (generalized)       G-Codes - 17-Jan-2016 1925    Functional Assessment Tool Used TUG score 14.63 secs; gait velocity 2.10 ft/sec with SPC; pt continues to have increased tone/spasticity which contributes to gait deviations and decr. L toe clearance in swing phase of gait   Functional Limitation Mobility: Walking and moving around   Mobility: Walking and Moving Around Current Status [phone]) At least 40 percent but less than 60 percent impaired, limited or restricted   Mobility: Walking and Moving Around Goal Status [phone]) At least 40 percent but less than 60 percent impaired, limited or restricted      Problem List Patient Active Problem List   Diagnosis Date Noted  . Orthostatic hypotension 11/30/2014  . C6 spinal cord injury ([place]) 11/20/2014  . Neurogenic bowel 11/20/2014  . Neurogenic bladder 11/20/2014   . Fever of unknown origin   . 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 C5 vertebral fracture ([facility]) 11/09/2014   Physical Therapy Progress Note  Dates of Reporting Period: 10-25-15 to 12-18-15  Objective Reports of Subjective Statement: see progress towards goals - pt continues to have balance and gait deficits  Objective Measurements: TUG score 14.63 secs with SPC:  Gait velocity 2.10 ft/sec with cane; pt continues to amb. With use of SPC with gait deviations  Goal Update: see above for progress towards goals  Plan: Gait training, TherEx for strengthening and stretching of trunk and LE's: NeuroRe-ed for balance retraining: Pt education: TherActivities  Reason Skilled Services are Required: Pt continues to have LE weakness with LLE weaker than RLE and spasticity in trunk and in LE's which contribute to gait deviations and decreased balance   Rhya Shan, [place], PT 12/19/2015, 7:29 PM  [facility] [address] [place], [place], [address] Phone: [phone]   Fax:  [phone]  Name: Layman Gully MRN: [id number] Date of Birth: Mar 21, 1951

## 2015-12-20 ENCOUNTER — Ambulatory Visit: Payer: Medicare Other | Admitting: Occupational Therapy

## 2015-12-20 ENCOUNTER — Ambulatory Visit: Payer: Medicare Other | Admitting: Physical Therapy

## 2015-12-20 DIAGNOSIS — M79602 Pain in left arm: Secondary | ICD-10-CM

## 2015-12-20 DIAGNOSIS — M6281 Muscle weakness (generalized): Secondary | ICD-10-CM

## 2015-12-20 DIAGNOSIS — R29818 Other symptoms and signs involving the nervous system: Secondary | ICD-10-CM

## 2015-12-20 DIAGNOSIS — R2689 Other abnormalities of gait and mobility: Secondary | ICD-10-CM

## 2015-12-20 DIAGNOSIS — M25642 Stiffness of left hand, not elsewhere classified: Secondary | ICD-10-CM

## 2015-12-21 NOTE — Therapy (Signed)
[facility] [address] [place], [place], [address] Phone: [phone]   Fax:  [phone]  Physical Therapy Treatment  Patient Details  Name: Carl [name] MRN: [id number] Date of Birth: 1951/03/23 Referring Provider: Dr. Alger Simons  Encounter Date: 12/20/2015      PT End of Session - 12/21/15 1004    Visit Number 11  G1   Number of Visits 17   Date for PT Re-Evaluation 12/24/15   Authorization Type Medicare   Authorization Time Period 10-25-15 - 12-24-15   PT Start Time 1317   PT Stop Time 1401   PT Time Calculation (min) 44 min      Past Medical History:  Diagnosis Date  . Bradycardia   . Chronic abdominal pain   . Hesitancy of micturition     Past Surgical History:  Procedure Laterality Date  . ANTERIOR CERVICAL DECOMP/DISCECTOMY FUSION N/A 11/14/2014   Procedure: Cervical Five-Cervical Six  Anterior Cervical Decompression/Diskectomy/Fusion;  Surgeon: Erline Levine, MD;  Location: [facility];  Service: Neurosurgery;  Laterality: N/A;    There were no vitals filed for this visit.      Subjective Assessment - 12/21/15 1000    Subjective Pt reports L leg is better today - not as stiff as it was the other day   Patient Stated Goals Increase LLE strength; decr. tightness in LLE   Currently in Pain? Yes                         [facility] Adult PT Treatment/Exercise - 12/21/15 0001      Lumbar Exercises: Stretches   Active Hamstring Stretch 1 rep;30 seconds     Lumbar Exercises: Aerobic   Stationary Bike SciFit level 4.0 x 5" with UE's and LE's   Elliptical 1 1/2" forward;  20 secs backward Level 1.1     Lumbar Exercises: Machines for Strengthening   Leg Press bil. LE's 60# 21 reps: LLE only 40# 3 sets 10 reps     Knee/Hip Exercises: Standing   Forward Step Up Left;1 set;10 reps;Step Height: 6"   Other Standing Knee Exercises bil. hip flexion, extension and abdct. with green  theraband 10 reps each direction      TherEx: pt sat on rolling stool - L hamstring strengthening ex. By pulling self forward around track for 100' LLE heel raises for plantarflexor strengthening x 10 reps Step downs for L quad strengthening x 10 reps - 6" step used with both hand rails for balance  NeuroRe-ed; rocker board inside // bars -10 reps with 1 UE support, 10 reps without UE support Standing on Bosu - L foot in middle for improved LLE SLS - moving RLE up/back and laterally - 10 reps Quick marching in place - without UE support with cues to lift LLE as high as possible for incr. coordination and control             PT Short Term Goals - 12/19/15 1922      PT SHORT TERM GOAL #1   Title Increase gait velocity to >/= 2.40 ft/sec with cane.  11-25-15   Baseline   2.03 ft/sec on 10-25-15:     15.62 secs = 2.10 ft/sec on 12-11-15   Time 4   Period Weeks   Status On-going     PT SHORT TERM GOAL #2   Title Improve TUG score to </= 15 secs for reduced fall risk.  11-25-15  Baseline 14.63 secs on 12-11-15   Time 4   Period Weeks   Status Achieved     PT SHORT TERM GOAL #3   Title Amb. 100' without device without occurrence of L toes catching for incr. safety with household mobility.  11-25-15   Baseline 1 occurrence of L toes catching - in curve of track -- 12-11-15   Time 4   Period Weeks   Status On-going     PT SHORT TERM GOAL #4   Title Negotiate steps without hand rail step by step with SBA .  11-25-15   Baseline met 12-11-15   Time 4   Period Weeks   Status Achieved     PT SHORT TERM GOAL #5   Title Independent in HEP for stretching and strengthening exs.  11-25-15   Status Achieved           PT Long Term Goals - 10/28/15 1616      PT LONG TERM GOAL #1   Title Increase gait velocity to >/= 2.7 ft/sec with cane to demo increased gait efficiency.  (12-24-15)   Baseline 2.03 ft/sec with cane - 10-25-15   Time 8   Period Weeks   Status New     PT LONG TERM GOAL  #2   Title Improve TUG score to </= 13.5 secs with cane for decr. fall risk.  (12-24-15)   Baseline 17.78 on 10-25-15 with cane   Time 8   Period Weeks   Status New     PT LONG TERM GOAL #3   Title Increase L SLS to >/= 4 secs to demo improved balance.  (12-24-15)   Baseline L SLS = 2.53 secs;   Time 8   Period Weeks   Status New     PT LONG TERM GOAL #4   Title Amb. at least 500' with cane on paved outdoor surfaces with minimal c/o fatigue and no LOB.  (12-24-15)   Time 8   Period Weeks   Status New               Plan - 12/21/15 1005    Clinical Impression Statement Pt progressing towards LTG's - increased LLE extensor tone/spasticity impedes mobility and flexibility - plan D/C next week as planned per initial POC   Rehab Potential Good   PT Frequency 2x / week   PT Duration 8 weeks   PT Treatment/Interventions ADLs/Self Care Home Management;Gait training;Stair training;Functional mobility training;Therapeutic activities;Patient/family education;Neuromuscular re-education;Balance training;Therapeutic exercise;Orthotic Fit/Training;Passive range of motion   PT Next Visit Plan cont LE stretching and strengthening   PT Home Exercise Plan see above   Consulted and Agree with Plan of Care Patient      Patient will benefit from skilled therapeutic intervention in order to improve the following deficits and impairments:  Abnormal gait, Decreased activity tolerance, Decreased balance, Decreased coordination, Decreased range of motion, Decreased mobility, Decreased strength, Impaired flexibility, Increased muscle spasms, Impaired sensation, Impaired tone, Impaired UE functional use, Pain  Visit Diagnosis: Other abnormalities of gait and mobility  Other symptoms and signs involving the nervous system  Muscle weakness (generalized)     Problem List Patient Active Problem List   Diagnosis Date Noted  . Orthostatic hypotension 11/30/2014  . C6 spinal cord injury ([place])  11/20/2014  . Neurogenic bowel 11/20/2014  . Neurogenic bladder 11/20/2014  . Fever of unknown origin   . 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 C5 vertebral fracture ([place]) 11/09/2014    Carl [name], Vaughan Basta  Vinnie Level, PT 12/21/2015, 10:08 AM  [facility] [address] [place], [place], [address] Phone: [phone]   Fax:  [phone]  Name: Gerrald Basu MRN: [id number] Date of Birth: 1951-03-26

## 2015-12-21 NOTE — Therapy (Signed)
[facility] [address] [place], [place], [address] Phone: [phone]   Fax:  [phone]  Occupational Therapy Treatment  Patient Details  Name: Carl [name] MRN: [id number] Date of Birth: 1950-07-29 Referring Provider: Dr. Naaman Plummer  Encounter Date: 12/20/2015      OT End of Session - 12/20/15 1434    Visit Number 11   Number of Visits 17   Date for OT Re-Evaluation 12/26/15   Authorization Type Medicare   Authorization - Visit Number 11   Authorization - Number of Visits 20   OT Start Time 1410  d/c visit finished early   OT Stop Time 1440   OT Time Calculation (min) 35 min   Activity Tolerance Patient tolerated treatment well   Behavior During Therapy [facility] for tasks assessed/performed      Past Medical History:  Diagnosis Date  . Bradycardia   . Chronic abdominal pain   . Hesitancy of micturition     Past Surgical History:  Procedure Laterality Date  . ANTERIOR CERVICAL DECOMP/DISCECTOMY FUSION N/A 11/14/2014   Procedure: Cervical Five-Cervical Six  Anterior Cervical Decompression/Diskectomy/Fusion;  Surgeon: Erline Levine, MD;  Location: [place] [facility];  Service: Neurosurgery;  Laterality: N/A;    There were no vitals filed for this visit.      Subjective Assessment - 12/21/15 1325    Pertinent History see Epic   Patient Stated Goals increase strength and use of LUe   Currently in Pain? Yes   Pain Score 5    Pain Location Arm   Pain Orientation Left   Pain Descriptors / Indicators Tightness   Pain Type Chronic pain   Pain Onset More than a month ago   Pain Frequency Intermittent   Aggravating Factors  exercise   Pain Relieving Factors pain meds   Multiple Pain Sites Yes        Ultrasound to left upper lateral arm 1 mhz, continuous, 0.8 w/cm 2, x 8 mins, no adverse reactions then to dorsal / lateral forearm, same parameters Therapist checked progress towards goals. Pt agrees with plans for  d/c.                        OT Short Term Goals - 12/20/15 1429      OT SHORT TERM GOAL #1   Title Pt will verbalize understanding of LUE positioning to minimize pain and risk for contracture   Time 4   Period Weeks   Status Achieved     OT SHORT TERM GOAL #2   Title Pt will be independent with updated HEP    Status Achieved     OT SHORT TERM GOAL #3   Title Pt will demonstrate improved LUE shoulder flexion as evidenced by retrieving a lightweight object at 110 shoulder flexion with pain no greater than 3/10.   Time 4   Period Weeks   Status Achieved  met for 110*     OT SHORT TERM GOAL #4   Title Pt will demonstrate at least 95% composite finger extension for LUE.   Time 4   Period Weeks   Status Not Met  80%           OT Long Term Goals - 12/20/15 1436      OT LONG TERM GOAL #1   Title Pt will improve LUE fine motor coordination as evidenced by decreasing 9 hole peg test score to 45 secs or less   Time 8  Period Weeks   Status Achieved     OT LONG TERM GOAL #2   Title Pt will increase LUE forearm pronation to 65* for increased LUE functional use.   Time 8   Period Weeks   Status --  65*     OT LONG TERM GOAL #3   Title Pt will demonstrate improved LUE functional use as evidenced by increasing box/ blocks to 48 blocks.   Status Achieved  47 blocks, 49 blocks approximating     OT LONG TERM GOAL #4   Title Pt will use LUE as a non dominant assist for ADLs/IADLs at least 50% of the time with pain no greater than 3/10               Plan - 12/21/15 1326    Clinical Impression Statement Pt continues to report LUE pain. Ultrasound alleviates temporarily, however pt reports that he believes that it is nerve pain. Pt requests d/c today.   Rehab Potential Good   OT Frequency 2x / week   OT Duration 8 weeks   OT Treatment/Interventions Self-care/ADL training;Therapeutic exercise;Patient/family education;Balance training;Splinting;Manual  Therapy;Neuromuscular education;Ultrasound;Energy conservation;Therapeutic exercises;Therapeutic activities;DME and/or AE instruction;Parrafin;Cryotherapy;Electrical Stimulation;Fluidtherapy;Passive range of motion;Contrast Bath;Moist Heat   Plan d/c OT   OT Home Exercise Plan Education provided:  HEP, splint wear/care (custom L resting hand splint)   Consulted and Agree with Plan of Care Patient      Patient will benefit from skilled therapeutic intervention in order to improve the following deficits and impairments:  Abnormal gait, Decreased coordination, Decreased range of motion, Difficulty walking, Impaired flexibility, Impaired sensation, Decreased safety awareness, Decreased endurance, Decreased activity tolerance, Decreased knowledge of precautions, Impaired tone, Pain, Impaired UE functional use, Decreased knowledge of use of DME, Decreased balance, Decreased mobility, Decreased strength  Visit Diagnosis: Muscle weakness (generalized)  Other symptoms and signs involving the nervous system  Pain In Left Arm  Stiffness of left hand, not elsewhere classified      G-Codes - 01/08/16 1640    Functional Assessment Tool Used 9 hole peg test LUE 42.81   Functional Limitation Carrying, moving and handling objects   Carrying, Moving and Handling Objects Goal Status (D8264) At least 1 percent but less than 20 percent impaired, limited or restricted   Carrying, Moving and Handling Objects Discharge Status [phone]) At least 1 percent but less than 20 percent impaired, limited or restricted    OCCUPATIONAL THERAPY DISCHARGE SUMMARY   Current functional level related to goals / functional outcomes: See above, pt did not fully mee all goals due to pain   Remaining deficits: Pain, decreased strength, decreased ROM   Education / Equipment: Pt was educated regarding splint wear, care and precautions and HEP. Pt verbalized understanding of all education.  Plan: Patient agrees to discharge.   Patient goals were partially met. Patient is being discharged due to the patient's request.  ?????      Problem List Patient Active Problem List   Diagnosis Date Noted  . Orthostatic hypotension 11/30/2014  . C6 spinal cord injury ([place]) 11/20/2014  . Neurogenic bowel 11/20/2014  . Neurogenic bladder 11/20/2014  . Fever of unknown origin   . 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 C5 vertebral fracture ([place]) 11/09/2014    Carl [name] 12/21/2015, 1:29 PM  [facility] [address] [place], [place], [address] Phone: [phone]   Fax:  [phone]  Name: Carl [name] MRN: [id number] Date of Birth: May 16, 1950

## 2015-12-24 ENCOUNTER — Encounter: Payer: Self-pay | Admitting: Physical Medicine & Rehabilitation

## 2015-12-24 ENCOUNTER — Encounter: Payer: Medicare Other | Attending: Physical Medicine & Rehabilitation | Admitting: Physical Medicine & Rehabilitation

## 2015-12-24 DIAGNOSIS — N319 Neuromuscular dysfunction of bladder, unspecified: Secondary | ICD-10-CM | POA: Diagnosis not present

## 2015-12-24 DIAGNOSIS — S12400A Unspecified displaced fracture of fifth cervical vertebra, initial encounter for closed fracture: Secondary | ICD-10-CM | POA: Insufficient documentation

## 2015-12-24 DIAGNOSIS — S14106S Unspecified injury at C6 level of cervical spinal cord, sequela: Secondary | ICD-10-CM

## 2015-12-24 DIAGNOSIS — K592 Neurogenic bowel, not elsewhere classified: Secondary | ICD-10-CM | POA: Diagnosis not present

## 2015-12-24 DIAGNOSIS — G8250 Quadriplegia, unspecified: Secondary | ICD-10-CM

## 2015-12-24 DIAGNOSIS — R001 Bradycardia, unspecified: Secondary | ICD-10-CM | POA: Diagnosis not present

## 2015-12-24 DIAGNOSIS — R109 Unspecified abdominal pain: Secondary | ICD-10-CM | POA: Diagnosis not present

## 2015-12-24 DIAGNOSIS — R3911 Hesitancy of micturition: Secondary | ICD-10-CM | POA: Diagnosis not present

## 2015-12-24 DIAGNOSIS — Z7901 Long term (current) use of anticoagulants: Secondary | ICD-10-CM | POA: Diagnosis not present

## 2015-12-24 DIAGNOSIS — G8929 Other chronic pain: Secondary | ICD-10-CM | POA: Insufficient documentation

## 2015-12-24 MED ORDER — TRAMADOL HCL 50 MG PO TABS: 50.0000 mg | ORAL_TABLET | Freq: Four times a day (QID) | ORAL | 2 refills | Status: DC | PRN

## 2015-12-24 NOTE — Progress Notes (Signed)
Subjective:    Patient ID: Carl [name], male    DOB: 1950/08/28, 65 y.o.   MRN: [id number]  HPI   Carl [name] is here in follow up of his cervical myelopathy and associated functional deficits and pain.  He is about to complete therapies. He has one more session of PT this week, and then he's done.   He continues to have left neuropathic arm pain which is worst in the morning and at night. He is only taking the gabapentin twice daily because he had a friend who became oversedated and went into kidney failure while using the drug.   He denies being depressed but is remains unhappy still how things are.   He uses tramadol 50mg  4x daily which seems to help his pain.    Pain Inventory Average Pain 4 Pain Right Now 4 My pain is sharp, burning, tingling and aching  In the last 24 hours, has pain interfered with the following? General activity 4 Relation with others 2 Enjoyment of life 7 What TIME of day is your pain at its worst? morning Sleep (in general) Good  Pain is worse with: inactivity and some activites Pain improves with: rest, therapy/exercise and medication Relief from Meds: 5  Mobility use a cane how many minutes can you walk? 20 ability to climb steps?  yes do you drive?  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  no  Physicians involved in your care Any changes since last visit?  no   Family History  Problem Relation Age of Onset  . Heart attack Father   . Alzheimer's disease Mother   . Aneurysm Brother     brain   Social History   Social History  . Marital status: Married    Spouse name: N/A  . Number of children: N/A  . Years of education: N/A   Occupational History  . Pastor    Social History Main Topics  . Smoking  status: Former Games developer  . Smokeless tobacco: None  . Alcohol use None  . Drug use: Unknown  . Sexual activity: Not Asked   Other Topics Concern  . None   Social History Narrative  . None   Past Surgical History:  Procedure Laterality Date  . ANTERIOR CERVICAL DECOMP/DISCECTOMY FUSION N/A 11/14/2014   Procedure: Cervical Five-Cervical Six  Anterior Cervical Decompression/Diskectomy/Fusion;  Surgeon: Maeola Harman, MD;  Location: [facility];  Service: Neurosurgery;  Laterality: N/A;   Past Medical History:  Diagnosis Date  . Bradycardia   . Chronic abdominal pain   . Hesitancy of micturition    BP 102/66   Pulse (!) 56   Resp 16   SpO2 97%   Opioid Risk Score:   Fall Risk Score:  `1  Depression screen PHQ 2/9  Depression screen [facility] 2/9 12/24/2015 01/09/2015  Decreased Interest 0 0  Down, Depressed, Hopeless 0 0  PHQ - 2 Score 0 0  Altered sleeping - 0  Tired, decreased energy - 1  Change in appetite - 1  Feeling bad or failure about yourself  - 0  Trouble concentrating - 0  Moving slowly or fidgety/restless - 0  Suicidal thoughts - 0  PHQ-9 Score - 2    Review of Systems  All other systems reviewed and are negative.  Objective:   Physical Exam  Constitutional: He is oriented to person, place, and time. He appears well-developed and well-nourished.  HENT:  Head: Normocephalic and atraumatic.  Eyes: Conjunctivae are normal. Pupils are equal, round, and reactive to light.  Neck:    Cardiovascular: bradycardia and regular rhythm.  Respiratory: Effort normal. No respiratory distress. He has no wheezes.  GI: Soft. Bowel sounds +.  Genitourinary: foley out Musculoskeletal: He exhibits no edema in either upper ext. Left axilla tender to palpation as was left AC jt. RTC impingement maneuvers equivocal on left.  Neurological: He is alert and oriented to person, place, and time. No cranial nerve deficit. Coordination normal.  Patient has normal sensation  proximal to C6 dermatome. Reduced PP/LT from C6 through sacral levels Patient has decreased tone in bilateral Lower extremities 4/5 bilateral deltoids, biceps, 4-triceps, 4 wrist extensors, 4/5 right HI, 3-/5 left HI, right HF, KE and ADF/APF 3+ to 4-/5. 3 to 3+/5 left hip flexors, 4/5 knee extensors 3+/4- ankle dorsiflexors and plantar flexors, left weaker than right. No resting tone LUE    Gait much improved with cane---still leans to right somewhat. Leg clearance beter. .  . Skin: Skin is warm and dry.    Psych: still quiet but alert    Assessment/Plan:  1. Functional deficits secondary to C6 ASIA C incomplete tetraplegia, spinal cord injury due to fall with C5-C6 fracture and neurogenic bowel and bladder  -continue outpt therapies to resolution---->HEP 2. For spasms: continue baclofen but at half dose (5mg ) at bedtime.    3. Pain Management: gabapentinneeds to increase to 300mg  TID---will titrate further if possible - tramadol q6  prn---refilled today 4. Mood: improved  5. Neurogenic bladder---I/O caths-  -continue to keep volumes 300-500cc   6. Neurogenic bowel: senna in pm and fleet enema qam.                        Thirty minutes of face to face patient care time were spent during this visit. All questions were encouraged and answered.

## 2015-12-24 NOTE — Patient Instructions (Signed)
INCREASE THE GABAPENTIN BACK TO THREE X DAILY.  IF AFTER 2 WEEKS, YOU DON'T SEE A BENEFIT, AND YOU'RE TOLERATING THE MEDICATION OK, WE CAN CONSIDER INCREASING FURTHER (VS TRYING ANOTHER MEDICATION)   PLEASE CALL ME WITH ANY PROBLEMS OR QUESTIONS [phone])

## 2015-12-25 ENCOUNTER — Encounter: Payer: Medicare Other | Admitting: Occupational Therapy

## 2015-12-25 ENCOUNTER — Ambulatory Visit: Payer: Medicare Other | Admitting: Physical Therapy

## 2015-12-25 DIAGNOSIS — M6281 Muscle weakness (generalized): Secondary | ICD-10-CM | POA: Diagnosis not present

## 2015-12-25 DIAGNOSIS — R2689 Other abnormalities of gait and mobility: Secondary | ICD-10-CM

## 2015-12-26 NOTE — Therapy (Signed)
[facility] [address] [place], [place], [address] Phone: [phone]   Fax:  [phone]  Physical Therapy Treatment  Patient Details  Name: Carl [name] MRN: [id number] Date of Birth: 02/23/1951 Referring Provider: Dr. Alger Simons  Encounter Date: 12/25/2015      PT End of Session - 12/26/15 1921    Visit Number 12   Number of Visits 17   Date for PT Re-Evaluation 12/24/15   Authorization Type Medicare   Authorization Time Period 10-25-15 - 12-24-15   PT Start Time 1315   PT Stop Time 1400   PT Time Calculation (min) 45 min      Past Medical History:  Diagnosis Date  . Bradycardia   . Chronic abdominal pain   . Hesitancy of micturition     Past Surgical History:  Procedure Laterality Date  . ANTERIOR CERVICAL DECOMP/DISCECTOMY FUSION N/A 11/14/2014   Procedure: Cervical Five-Cervical Six  Anterior Cervical Decompression/Diskectomy/Fusion;  Surgeon: Erline Levine, MD;  Location: [place] [facility];  Service: Neurosurgery;  Laterality: N/A;    There were no vitals filed for this visit.      Subjective Assessment - 12/26/15 1918    Subjective Pt reports that he had appt with Dr. Naaman Plummer on Monday- said that his walking had improved  - said "my hip isn't going out as much"   Patient Stated Goals Increase LLE strength; decr. tightness in LLE   Currently in Pain? No/denies                         [facility] Adult PT Treatment/Exercise - 12/26/15 0001      Lumbar Exercises: Stretches   Active Hamstring Stretch 1 rep;30 seconds     Lumbar Exercises: Aerobic   Stationary Bike SciFit level 4.0 x 5" with UE's and LE's   Elliptical 1" forward; 1" backward     Lumbar Exercises: Machines for Strengthening   Leg Press bil. LE's 60# 15 reps: LLE only 40# 3 sets 10 reps     Lumbar Exercises: Standing   Functional Squats 10 reps  on BOSU     Knee/Hip Exercises: Stretches   Gastroc Stretch Both;1  rep;30 seconds     TherAct:  Floor to stand transfers - 3 reps with UE support on mat - with SBA; attempted transfer without UE support but pt  Unable to do due to LE weakness and spasticity Stretching quads in tall kneeling position - 30 sec hold x 4 reps  NeuroRe-ed; rocker board inside // bars -10 reps with 1 UE support, 10 reps without UE support Standing on Bosu - L foot in middle for improved LLE SLS - moving RLE up/back and laterally - 10 reps Quick marching in place - without UE support with cues to lift LLE as high as possible for incr. coordination and control             PT Short Term Goals - 12/19/15 1922      PT SHORT TERM GOAL #1   Title Increase gait velocity to >/= 2.40 ft/sec with cane.  11-25-15   Baseline   2.03 ft/sec on 10-25-15:     15.62 secs = 2.10 ft/sec on 12-11-15   Time 4   Period Weeks   Status On-going     PT SHORT TERM GOAL #2   Title Improve TUG score to </= 15 secs for reduced fall risk.  11-25-15   Baseline 14.63 secs on  12-11-15   Time 4   Period Weeks   Status Achieved     PT SHORT TERM GOAL #3   Title Amb. 100' without device without occurrence of L toes catching for incr. safety with household mobility.  11-25-15   Baseline 1 occurrence of L toes catching - in curve of track -- 12-11-15   Time 4   Period Weeks   Status On-going     PT SHORT TERM GOAL #4   Title Negotiate steps without hand rail step by step with SBA .  11-25-15   Baseline met 12-11-15   Time 4   Period Weeks   Status Achieved     PT SHORT TERM GOAL #5   Title Independent in HEP for stretching and strengthening exs.  11-25-15   Status Achieved           PT Long Term Goals - 10/28/15 1616      PT LONG TERM GOAL #1   Title Increase gait velocity to >/= 2.7 ft/sec with cane to demo increased gait efficiency.  (12-24-15)   Baseline 2.03 ft/sec with cane - 10-25-15   Time 8   Period Weeks   Status New     PT LONG TERM GOAL #2   Title Improve TUG score to </= 13.5  secs with cane for decr. fall risk.  (12-24-15)   Baseline 17.78 on 10-25-15 with cane   Time 8   Period Weeks   Status New     PT LONG TERM GOAL #3   Title Increase L SLS to >/= 4 secs to demo improved balance.  (12-24-15)   Baseline L SLS = 2.53 secs;   Time 8   Period Weeks   Status New     PT LONG TERM GOAL #4   Title Amb. at least 500' with cane on paved outdoor surfaces with minimal c/o fatigue and no LOB.  (12-24-15)   Time 8   Period Weeks   Status New               Plan - 12/26/15 1922    Clinical Impression Statement Pt progressing well towards LTG's - spasticity continues to impact gait by contributing to decr. foot clearance with hip hiking to assist with clearance; D/C planned next session   Rehab Potential Good   PT Frequency 2x / week   PT Duration 8 weeks   PT Treatment/Interventions ADLs/Self Care Home Management;Gait training;Stair training;Functional mobility training;Therapeutic activities;Patient/family education;Neuromuscular re-education;Balance training;Therapeutic exercise;Orthotic Fit/Training;Passive range of motion   PT Next Visit Plan cont LE stretching and strengthening   PT Home Exercise Plan see above   Consulted and Agree with Plan of Care Patient      Patient will benefit from skilled therapeutic intervention in order to improve the following deficits and impairments:  Abnormal gait, Decreased activity tolerance, Decreased balance, Decreased coordination, Decreased range of motion, Decreased mobility, Decreased strength, Impaired flexibility, Increased muscle spasms, Impaired sensation, Impaired tone, Impaired UE functional use, Pain  Visit Diagnosis: Other abnormalities of gait and mobility  Muscle weakness (generalized)     Problem List Patient Active Problem List   Diagnosis Date Noted  . Orthostatic hypotension 11/30/2014  . C6 spinal cord injury ([place]) 11/20/2014  . Neurogenic bowel 11/20/2014  . Neurogenic bladder 11/20/2014   . Fever of unknown origin   . 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 C5 vertebral fracture ([place]) 11/09/2014    Ayano Douthitt, [place], PT 12/26/2015, 7:25 PM  [facility] [address] [place], [place], [address] Phone: [phone]   Fax:  [phone]  Name: Carl [name] MRN: [id number] Date of Birth: 1950-10-29

## 2015-12-27 ENCOUNTER — Ambulatory Visit: Payer: Medicare Other | Admitting: Physical Therapy

## 2015-12-27 ENCOUNTER — Encounter: Payer: Medicare Other | Admitting: Occupational Therapy

## 2015-12-27 DIAGNOSIS — R2689 Other abnormalities of gait and mobility: Secondary | ICD-10-CM

## 2015-12-27 DIAGNOSIS — M6281 Muscle weakness (generalized): Secondary | ICD-10-CM | POA: Diagnosis not present

## 2015-12-27 NOTE — Therapy (Signed)
[facility] [address] [place], [place], [address] Phone: [phone]   Fax:  [phone]  Physical Therapy Treatment  Patient Details  Name: Carl [name] MRN: [id number] Date of Birth: 1950/12/24 Referring Provider: Dr. Alger Simons  Encounter Date: 12/27/2015      PT End of Session - 12/27/15 1946    Visit Number 13   Number of Visits 17   Date for PT Re-Evaluation 12/24/15   Authorization Type Medicare   Authorization Time Period 10-25-15 - 12-24-15   PT Start Time 1535   PT Stop Time 4128   PT Time Calculation (min) 46 min      Past Medical History:  Diagnosis Date  . Bradycardia   . Chronic abdominal pain   . Hesitancy of micturition     Past Surgical History:  Procedure Laterality Date  . ANTERIOR CERVICAL DECOMP/DISCECTOMY FUSION N/A 11/14/2014   Procedure: Cervical Five-Cervical Six  Anterior Cervical Decompression/Diskectomy/Fusion;  Surgeon: Erline Levine, MD;  Location: [facility];  Service: Neurosurgery;  Laterality: N/A;    There were no vitals filed for this visit.      Subjective Assessment - 12/27/15 1931    Subjective Pt reports he feels his legs have gotten much stronger with the therapy - ready for discharge today - plans on joining [employer]   Patient Stated Goals Increase LLE strength; decr. tightness in LLE   Currently in Pain? No/denies                         [facility] Adult PT Treatment/Exercise - 12/27/15 1602      Ambulation/Gait   Ambulation/Gait Yes   Ambulation/Gait Assistance 6: Modified independent (Device/Increase time)   Ambulation Distance (Feet) 75 Feet   Assistive device None   Gait Pattern Decreased dorsiflexion - left;Decreased step length - left;Decreased arm swing - left;Step-through pattern   Ambulation Surface Level;Indoor   Gait velocity 13.79 secs = 2.38 ft/sec   14.78 secs 1st trial   Stairs Yes   Stairs Assistance 6: Modified  independent (Device/Increase time)   Stair Management Technique One rail Right;Alternating pattern;Forwards   Number of Stairs 4   Height of Stairs 6     Standardized Balance Assessment   Standardized Balance Assessment Timed Up and Go Test     Timed Up and Go Test   TUG Normal TUG   Normal TUG (seconds) 12.69  with SPC     Lumbar Exercises: Stretches   Active Hamstring Stretch 1 rep;30 seconds     Lumbar Exercises: Aerobic   Elliptical 1" forward; 1" backward     Lumbar Exercises: Machines for Strengthening   Leg Press bil. LE's 70# 30 reps: LLE only 50# 2 sets 10 reps     Knee/Hip Exercises: Stretches   Sports administrator Both;2 reps;30 seconds  in tall kneeling position on floor   Gastroc Stretch Both;1 rep;30 seconds     Knee/Hip Exercises: Standing   Other Standing Knee Exercises bil. hip flexion, extension and abdct. with green theraband 10 reps each direction      TherAct; floor to stand transfer with UE support on mat; 1st transfer - bil. UE's used; 2nd transfer - only RUE used             PT Short Term Goals - 12/19/15 1922      PT SHORT TERM GOAL #1   Title Increase gait velocity to >/= 2.40 ft/sec with cane.  11-25-15   Baseline   2.03 ft/sec on 10-25-15:     15.62 secs = 2.10 ft/sec on 12-11-15   Time 4   Period Weeks   Status On-going     PT SHORT TERM GOAL #2   Title Improve TUG score to </= 15 secs for reduced fall risk.  11-25-15   Baseline 14.63 secs on 12-11-15   Time 4   Period Weeks   Status Achieved     PT SHORT TERM GOAL #3   Title Amb. 100' without device without occurrence of L toes catching for incr. safety with household mobility.  11-25-15   Baseline 1 occurrence of L toes catching - in curve of track -- 12-11-15   Time 4   Period Weeks   Status On-going     PT SHORT TERM GOAL #4   Title Negotiate steps without hand rail step by step with SBA .  11-25-15   Baseline met 12-11-15   Time 4   Period Weeks   Status Achieved     PT SHORT TERM  GOAL #5   Title Independent in HEP for stretching and strengthening exs.  11-25-15   Status Achieved           PT Long Term Goals - 12/27/15 1938      PT LONG TERM GOAL #1   Title Increase gait velocity to >/= 2.7 ft/sec with cane to demo increased gait efficiency.  (12-24-15)   Baseline 14.78; 13.79 = 2.38 ft/sec on 12-27-15   Period Weeks   Status Partially Met     PT LONG TERM GOAL #2   Title Improve TUG score to </= 13.5 secs with cane for decr. fall risk.  (12-24-15)   Baseline 14.53; 12.69 secs on 2nd trial --- 12-27-15   Status Achieved     PT LONG TERM GOAL #3   Title Increase L SLS to >/= 4 secs to demo improved balance.  (12-24-15)   Baseline L SLS = 4.90 secs on 12-27-15   Status Achieved     PT LONG TERM GOAL #4   Title Amb. at least 500' with cane on paved outdoor surfaces with minimal c/o fatigue and no LOB.  (12-24-15)   Baseline met per pt report -- 12-27-15   Status Achieved               Plan - 12/27/15 1947    Clinical Impression Statement Pt has met all LTG's with exception of LTG #1 -gait velocity has increased from 2.03 ft/sec to 2.38 ft/sec but did not fully meet goal of 2.7 ft/sec due to continued spasticity and LLE weakness; spasticity limits L hip and knee flexion in swing phase of gait   Rehab Potential Good   PT Frequency 2x / week   PT Duration 8 weeks   PT Treatment/Interventions ADLs/Self Care Home Management;Gait training;Stair training;Functional mobility training;Therapeutic activities;Patient/family education;Neuromuscular re-education;Balance training;Therapeutic exercise;Orthotic Fit/Training;Passive range of motion   PT Next Visit Plan D/C   PT Home Exercise Plan see above   Consulted and Agree with Plan of Care Patient      Patient will benefit from skilled therapeutic intervention in order to improve the following deficits and impairments:  Abnormal gait, Decreased activity tolerance, Decreased balance, Decreased coordination,  Decreased range of motion, Decreased mobility, Decreased strength, Impaired flexibility, Increased muscle spasms, Impaired sensation, Impaired tone, Impaired UE functional use, Pain  Visit Diagnosis: Other abnormalities of gait and mobility  Muscle weakness (generalized)  G-Codes - 12/27/15 1951    Functional Assessment Tool Used TUG score 12.69 secs:  gait velocity 2.38 ft/sec with SPC   Functional Limitation Mobility: Walking and moving around   Mobility: Walking and Moving Around Goal Status [phone]) At least 40 percent but less than 60 percent impaired, limited or restricted   Mobility: Walking and Moving Around Discharge Status [phone]) At least 40 percent but less than 60 percent impaired, limited or restricted      Problem List Patient Active Problem List   Diagnosis Date Noted  . Orthostatic hypotension 11/30/2014  . C6 spinal cord injury ([place]) 11/20/2014  . Neurogenic bowel 11/20/2014  . Neurogenic bladder 11/20/2014  . Fever of unknown origin   . 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 C5 vertebral fracture ([place]) 11/09/2014    PHYSICAL THERAPY DISCHARGE SUMMARY  Visits from Start of Care: 13  Current functional level related to goals / functional outcomes: See above for progress towards goals     Remaining deficits: Pt continues to have LLE weakness and increased spasticity in trunk and LE's with LLE>RLE:  This spasticity limits L hip and  knee flexion in swing phase of gait Pt also continues to have decr. high level standing balance skills -  i.e. decr. L SLS Education / Equipment: Pt has been instructed in LE stretching and strengthening HEP:  Pt reports he is going to join [employer] to continue with PRE's for LE strengthening Plan: Patient agrees to discharge.  Patient goals were met. Patient is being discharged due to meeting the stated rehab goals.  ?????       Alda Lea, PT 12/27/2015, 7:55 PM  [facility] [address] [place], [place], [address] Phone: [phone]   Fax:  [phone]  Name: Carl [name] MRN: [id number] Date of Birth: [birth year]

## 2016-02-19 ENCOUNTER — Encounter (INDEPENDENT_AMBULATORY_CARE_PROVIDER_SITE_OTHER): Payer: Self-pay

## 2016-02-19 ENCOUNTER — Encounter: Payer: Medicare Other | Attending: Physical Medicine & Rehabilitation | Admitting: Physical Medicine & Rehabilitation

## 2016-02-19 ENCOUNTER — Encounter: Payer: Self-pay | Admitting: Physical Medicine & Rehabilitation

## 2016-02-19 VITALS — BP 92/59 | HR 48 | Resp 14

## 2016-02-19 DIAGNOSIS — R3911 Hesitancy of micturition: Secondary | ICD-10-CM | POA: Insufficient documentation

## 2016-02-19 DIAGNOSIS — K592 Neurogenic bowel, not elsewhere classified: Secondary | ICD-10-CM

## 2016-02-19 DIAGNOSIS — R001 Bradycardia, unspecified: Secondary | ICD-10-CM | POA: Diagnosis not present

## 2016-02-19 DIAGNOSIS — S14106S Unspecified injury at C6 level of cervical spinal cord, sequela: Secondary | ICD-10-CM

## 2016-02-19 DIAGNOSIS — M792 Neuralgia and neuritis, unspecified: Secondary | ICD-10-CM | POA: Diagnosis not present

## 2016-02-19 DIAGNOSIS — G8929 Other chronic pain: Secondary | ICD-10-CM | POA: Insufficient documentation

## 2016-02-19 DIAGNOSIS — R109 Unspecified abdominal pain: Secondary | ICD-10-CM | POA: Insufficient documentation

## 2016-02-19 DIAGNOSIS — S12400A Unspecified displaced fracture of fifth cervical vertebra, initial encounter for closed fracture: Secondary | ICD-10-CM | POA: Diagnosis not present

## 2016-02-19 DIAGNOSIS — N319 Neuromuscular dysfunction of bladder, unspecified: Secondary | ICD-10-CM | POA: Diagnosis not present

## 2016-02-19 DIAGNOSIS — Z7901 Long term (current) use of anticoagulants: Secondary | ICD-10-CM | POA: Diagnosis not present

## 2016-02-19 DIAGNOSIS — G8250 Quadriplegia, unspecified: Secondary | ICD-10-CM

## 2016-02-19 MED ORDER — TRAMADOL HCL 50 MG PO TABS: 50.0000 mg | ORAL_TABLET | Freq: Four times a day (QID) | ORAL | 2 refills | Status: DC | PRN

## 2016-02-19 MED ORDER — GABAPENTIN 400 MG PO CAPS: 400.0000 mg | ORAL_CAPSULE | Freq: Three times a day (TID) | ORAL | 3 refills | Status: DC

## 2016-02-19 NOTE — Progress Notes (Signed)
Subjective:    Patient ID: Carl [name], male    DOB: Jun 18, 1950, 65 y.o.   MRN: [id number]  HPI   Carl [name] is here in follow up of his cervical myelopathy. He reports constipation recently. He has gone a few days without a BM and is feeling quite constipation. He states that prior to this his bm's have been regular and normally formed. He takes senokot one bid with a daily fleet enema.   He continues to I/O cath. He uses his cane for gait. He hasn't had any falls.   His hands and left arm still are tingling and uncomfortable at times. They bother him with activity and at night. The increase in gabapentin seems to have helped his symptoms somewhat. He is taking 300mg  tid along with tramadol 50mg  q6 prn.     Pain Inventory Average Pain 4 Pain Right Now 4 My pain is sharp, stabbing and tingling  In the last 24 hours, has pain interfered with the following? General activity 4 Relation with others 1 Enjoyment of life 7 What TIME of day is your pain at its worst? morning, night Sleep (in general) Fair  Pain is worse with: inactivity and some activites Pain improves with: medication Relief from Meds: 2  Mobility use a cane how many minutes can you walk? 20 ability to climb steps?  yes do you drive?  yes Do you have any goals in this area?  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  no  Physicians involved in your care Any changes since last visit?  no   Family History  Problem Relation Age of Onset  . Heart attack Father   . Alzheimer's disease Mother   . Aneurysm Brother     brain   Social History   Social History  . Marital status: Married    Spouse name: N/A  . Number of children: N/A  . Years of education: N/A   Occupational History  . Pastor    Social History Main Topics  .  Smoking status: Former Games developermoker  . Smokeless tobacco: None  . Alcohol use None  . Drug use: Unknown  . Sexual activity: Not Asked   Other Topics Concern  . None   Social History Narrative  . None   Past Surgical History:  Procedure Laterality Date  . ANTERIOR CERVICAL DECOMP/DISCECTOMY FUSION N/A 11/14/2014   Procedure: Cervical Five-Cervical Six  Anterior Cervical Decompression/Diskectomy/Fusion;  Surgeon: Maeola HarmanJoseph Stern, MD;  Location: [facility];  Service: Neurosurgery;  Laterality: N/A;   Past Medical History:  Diagnosis Date  . Bradycardia   . Chronic abdominal pain   . Hesitancy of micturition    BP (!) 92/59   Pulse (!) 48   Resp 14   SpO2 97%   Opioid Risk Score:   Fall Risk Score:  `1  Depression screen PHQ 2/9  Dep[facility] screen [facility] 12/24/2015 01/09/2015  Decreased Interest 0 0  Down, Depressed, Hopeless 0 0  PHQ - 2 Score 0 0  Altered sleeping - 0  Tired, decreased energy - 1  Change in appetite - 1  Feeling bad or failure about yourself  - 0  Trouble concentrating - 0  Moving slowly or fidgety/restless - 0  Suicidal thoughts - 0  PHQ-9 Score - 2    Review of Systems  Constitutional: Positive  for activity change and unexpected weight change.  HENT: Negative.   Eyes: Negative.   Respiratory: Positive for shortness of breath.   Cardiovascular: Negative.   Gastrointestinal: Positive for abdominal pain and constipation.  Endocrine: Negative.   Genitourinary: Negative.   Musculoskeletal: Negative.   Skin: Negative.   Allergic/Immunologic: Negative.   Neurological: Negative.   Hematological: Negative.   Psychiatric/Behavioral: Negative.   All other systems reviewed and are negative.      Objective:   Physical Exam  Constitutional: He is oriented to person, place, and time. He appears well-developed and well-nourished.  HENT:  Head: Normocephalic and atraumatic.  Eyes: Conjunctivae are normal. Pupils are equal, round, and reactive to light.   Neck:    Cardiovascular: bradycardia and regular rhythm.  Respiratory: Effort normal. No respiratory distress. He has no wheezes.  GI: Soft. Bowel sounds +.  Genitourinary: foley out Musculoskeletal: mild left shoulder pain with IR/ER  Neurological: He is alert and oriented to person, place, and time. No cranial nerve deficit. Coordination normal.  Patient has normal sensation proximal to C6 dermatome. Reduced PP/LT from C6 through sacral levels Patient has decreased tone in bilateral Lower extremities 4/5 bilateral deltoids, biceps, 4-triceps, 4 wrist extensors, 4/5 right HI, 4/5 left HI, right HF, KE and ADF/APF   4-/5. 3 to 4-/5 left hip flexors, 4/5 knee extensors 3+/4- ankle dorsiflexors and plantar flexors, left weaker than right. No resting tone LUE    Gait stable with cane---better leg swing .  Marland Kitchen. Skin: Skin is warm and dry.    Psych: pleasant as always   Assessment/Plan:  1. Functional deficits secondary to C6 ASIA C incomplete tetraplegia, spinal cord injury due to fall with C5-C6 fracture and neurogenic bowel and bladder  - hep 2. For spasms: continue baclofen  (5mg ) at bedtime.    3. Pain Management: gabapentin increase to 400mg  TID---titrate further if needed - tramadol q6  prn---refilled today--goal is to wean this DOWN 4. Mood: improved  5. Neurogenic bladder---I/O caths-  -target volumes are 300-500cc   6. Neurogenic bowel: senna in pm and fleet enema qam.  -sorbitol, miralax for rescue  -reduce tramadol              Thirty minutes of face to face patient care time were spent during this visit. All questions were encouraged and answered.

## 2016-02-19 NOTE — Patient Instructions (Signed)
AS RESCUE MEDS CONSIDER MIRALAX OR SORBITOL  PER PACKAGE DIRECTIONS   WE CAN TITRATE THE GABAPENTIN FURTHER WITH THE IDEA OF REDUCING THE TOPAMAX.

## 2016-03-02 ENCOUNTER — Other Ambulatory Visit: Payer: Self-pay | Admitting: Physical Medicine & Rehabilitation

## 2016-03-02 DIAGNOSIS — S14106S Unspecified injury at C6 level of cervical spinal cord, sequela: Secondary | ICD-10-CM

## 2016-03-02 DIAGNOSIS — K592 Neurogenic bowel, not elsewhere classified: Secondary | ICD-10-CM

## 2016-03-02 DIAGNOSIS — G8250 Quadriplegia, unspecified: Secondary | ICD-10-CM

## 2016-03-02 DIAGNOSIS — N319 Neuromuscular dysfunction of bladder, unspecified: Secondary | ICD-10-CM

## 2016-03-14 ENCOUNTER — Telehealth: Payer: Self-pay

## 2016-03-14 ENCOUNTER — Other Ambulatory Visit: Payer: Self-pay

## 2016-03-14 DIAGNOSIS — S14106S Unspecified injury at C6 level of cervical spinal cord, sequela: Secondary | ICD-10-CM

## 2016-03-14 DIAGNOSIS — G8250 Quadriplegia, unspecified: Secondary | ICD-10-CM

## 2016-03-14 DIAGNOSIS — K592 Neurogenic bowel, not elsewhere classified: Secondary | ICD-10-CM

## 2016-03-14 DIAGNOSIS — N319 Neuromuscular dysfunction of bladder, unspecified: Secondary | ICD-10-CM

## 2016-03-14 MED ORDER — TRAMADOL HCL 50 MG PO TABS: 50.0000 mg | ORAL_TABLET | Freq: Four times a day (QID) | ORAL | 2 refills | Status: DC | PRN

## 2016-03-14 NOTE — Telephone Encounter (Signed)
Patients wife stated "she has misplaced her husbands Rx of tramadol and would like a new Rx"  Please advise

## 2016-03-14 NOTE — Telephone Encounter (Signed)
May RF x2

## 2016-03-14 NOTE — Telephone Encounter (Signed)
Patient has been notified of Rx refill of Tramadol. Rx was sent to [facility] [place]. Per Dr. Riley KillSwartz

## 2016-03-24 NOTE — Telephone Encounter (Signed)
error 

## 2016-05-19 ENCOUNTER — Encounter: Payer: Medicare Other | Attending: Physical Medicine & Rehabilitation | Admitting: Physical Medicine & Rehabilitation

## 2016-05-19 ENCOUNTER — Encounter: Payer: Self-pay | Admitting: Physical Medicine & Rehabilitation

## 2016-05-19 VITALS — BP 98/60 | HR 48 | Resp 14

## 2016-05-19 DIAGNOSIS — R001 Bradycardia, unspecified: Secondary | ICD-10-CM | POA: Insufficient documentation

## 2016-05-19 DIAGNOSIS — S14106S Unspecified injury at C6 level of cervical spinal cord, sequela: Secondary | ICD-10-CM | POA: Diagnosis present

## 2016-05-19 DIAGNOSIS — R109 Unspecified abdominal pain: Secondary | ICD-10-CM | POA: Insufficient documentation

## 2016-05-19 DIAGNOSIS — F329 Major depressive disorder, single episode, unspecified: Secondary | ICD-10-CM | POA: Diagnosis not present

## 2016-05-19 DIAGNOSIS — N319 Neuromuscular dysfunction of bladder, unspecified: Secondary | ICD-10-CM | POA: Diagnosis not present

## 2016-05-19 DIAGNOSIS — G8250 Quadriplegia, unspecified: Secondary | ICD-10-CM | POA: Diagnosis not present

## 2016-05-19 DIAGNOSIS — R3911 Hesitancy of micturition: Secondary | ICD-10-CM | POA: Diagnosis not present

## 2016-05-19 DIAGNOSIS — K592 Neurogenic bowel, not elsewhere classified: Secondary | ICD-10-CM | POA: Insufficient documentation

## 2016-05-19 DIAGNOSIS — G8929 Other chronic pain: Secondary | ICD-10-CM | POA: Insufficient documentation

## 2016-05-19 DIAGNOSIS — S12400A Unspecified displaced fracture of fifth cervical vertebra, initial encounter for closed fracture: Secondary | ICD-10-CM | POA: Diagnosis not present

## 2016-05-19 DIAGNOSIS — Z7901 Long term (current) use of anticoagulants: Secondary | ICD-10-CM | POA: Insufficient documentation

## 2016-05-19 MED ORDER — TRAMADOL HCL 50 MG PO TABS: 50.0000 mg | ORAL_TABLET | Freq: Four times a day (QID) | ORAL | 3 refills | Status: DC | PRN

## 2016-05-19 MED ORDER — DULOXETINE HCL 20 MG PO CPEP: 20.0000 mg | ORAL_CAPSULE | Freq: Every day | ORAL | 2 refills | Status: DC

## 2016-05-19 NOTE — Patient Instructions (Signed)
PLEASE FEEL FREE TO CALL OUR OFFICE WITH ANY PROBLEMS OR QUESTIONS ([phone])      

## 2016-05-19 NOTE — Progress Notes (Signed)
Subjective:    Patient ID: Carl [name], male    DOB: 10-Nov-1950, 66 y.o.   MRN: [id number]  HPI   [name] thinks that he's weaker than he has been . He doesn't have much motivation and finds himself at home quite a bit. He is getting short of breath with basic activities now. He doesn't go to sing at church anymore because he gets short winded.   He couldn't tolerate the gabapentin and backed down to the 300mg  tid dose again.   Bladder and bowel function haven't changed. He is keeping his bladder volumes at 300-500 cc 3-4 x per day.   His appetite is fair. He hasn't lost any further weight.   I asked him if he feels that he's depressed and he admitted that it may be a problem.    Pain Inventory Average Pain 4 Pain Right Now 4 My pain is sharp, burning, stabbing, tingling and aching  In the last 24 hours, has pain interfered with the following? General activity 7 Relation with others 5 Enjoyment of life 8 What TIME of day is your pain at its worst? morning, evening  Sleep (in general) Good  Pain is worse with: bending and some activites Pain improves with: rest, therapy/exercise and medication Relief from Meds: 4  Mobility walk with assistance use a cane how many minutes can you walk? 20 ability to climb steps?  yes do you drive?  yes Do you have any goals in this area?  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  no  Physicians involved in your care Any changes since last visit?  no   Family History  Problem Relation Age of Onset  . Heart attack Father   . Alzheimer's disease Mother   . Aneurysm Brother     brain   Social History   Social History  . Marital status: Married    Spouse name: N/A   . Number of children: N/A  . Years of education: N/A   Occupational History  . Pastor    Social History Main Topics  . Smoking status: Former Games developermoker  . Smokeless tobacco: Never Used  . Alcohol use Not on file  . Drug use: Unknown  . Sexual activity: Not on file   Other Topics Concern  . Not on file   Social History Narrative  . No narrative on file   Past Surgical History:  Procedure Laterality Date  . ANTERIOR CERVICAL DECOMP/DISCECTOMY FUSION N/A 11/14/2014   Procedure: Cervical Five-Cervical Six  Anterior Cervical Decompression/Diskectomy/Fusion;  Surgeon: Maeola HarmanJoseph Stern, MD;  Location: [facility];  Service: Neurosurgery;  Laterality: N/A;   Past Medical History:  Diagnosis Date  . Bradycardia   . Chronic abdominal pain   . Hesitancy of micturition    BP 98/60   Pulse (!) 48   Resp 14   SpO2 95%   Opioid Risk Score:   Fall Risk Score:  `1  Depression screen PHQ 2/9  Dep[facility] screen [facility] 12/24/2015 01/09/2015  Decreased Interest 0 0  Down, Depressed, Hopeless 0 0  PHQ - 2 Score 0 0  Altered sleeping - 0  Tired, decreased energy - 1  Change in appetite - 1  Feeling bad or failure about yourself  -  0  Trouble concentrating - 0  Moving slowly or fidgety/restless - 0  Suicidal thoughts - 0  PHQ-9 Score - 2    Review of Systems  Constitutional: Positive for appetite change and unexpected weight change.  Eyes: Negative.   Respiratory: Positive for shortness of breath.   Gastrointestinal: Positive for abdominal pain.  Genitourinary: Positive for difficulty urinating.       Catheterization  Musculoskeletal: Positive for arthralgias, gait problem and myalgias.       Spasms   Skin: Negative.   Neurological: Positive for tremors, weakness and numbness.       Tingling   All other systems reviewed and are negative.      Objective:   Physical Exam  Constitutional: He is oriented to person, place, and time. He appears well-developed and well-nourished.   HENT:  Head: Normocephalic and atraumatic.  Eyes: PERRL  Cardiovascular: RRR  Respiratory: normal effort GI: Soft. Bowel sounds +.  Genitourinary:  N/a Musculoskeletal: pain with bilateral shoulder IR/ER  Neurological: He is alert and oriented to person, place, and time. No cranial nerve deficit. Coordination normal.  Patient has normal sensation proximal to C6 dermatome. Reduced PP/LT from C6 through sacral levels Patient has decreased tone in bilateral Lower extremities 4/5 bilateral deltoids, biceps, 4-triceps, 4 wrist extensors, 4/5 right HI, 4/5 left HI, right HF, KE and ADF/APF   4-/5. 3 to 4-/5 left hip flexors, 4/5 knee extensors 3+/4- ankle dorsiflexors and plantar flexors, left weaker than right---no changes. Seems to transfer more slowly and walke with a more deliberate gait with his walker however Gait stable with cane---better leg swing .  Marland Kitchen Skin: Skin is warm and dry.   Psych: pleasant but flat   Assessment/Plan:  1. Functional deficits secondary to C6 ASIA C incomplete tetraplegia, spinal cord injury due to fall with C5-C6 fracture and neurogenic bowel and bladder  - make a referral to neuro-rehab to work on higher level balance/stamina. Would like to transition to a REGULAR HEP.  -completed jury duty paperwork for patient 2. For spasms: continue baclofen  (5mg ) at bedtime.  3. Pain Management: gabapentin continue at 300mg  TID - tramadol q6 prn---refilled today--goal is to wean this DOWN -add cymbalta for pain and mood, 20mg  daily 4. Mood: definitely playing a role. cymbalta to begin   5. Neurogenic bladder---I/O caths-  -target volumes remain 300-500cc   6. Neurogenic bowel: senna in pm and fleet enema qam.             -sorbitol, miralax for rescue                Thirty minutes of face to face patient care time were spent during this visit. All questions were encouraged and answered. Greater than 50% of time during this encounter was  spent counseling patient/family in regard to depression, neurological recovery.

## 2016-05-29 ENCOUNTER — Other Ambulatory Visit: Payer: Self-pay | Admitting: Physical Medicine & Rehabilitation

## 2016-05-29 DIAGNOSIS — G8250 Quadriplegia, unspecified: Secondary | ICD-10-CM

## 2016-05-29 DIAGNOSIS — N319 Neuromuscular dysfunction of bladder, unspecified: Secondary | ICD-10-CM

## 2016-05-29 DIAGNOSIS — K592 Neurogenic bowel, not elsewhere classified: Secondary | ICD-10-CM

## 2016-05-29 DIAGNOSIS — S14106S Unspecified injury at C6 level of cervical spinal cord, sequela: Secondary | ICD-10-CM

## 2016-05-30 ENCOUNTER — Ambulatory Visit: Payer: Medicare Other | Attending: Physical Medicine & Rehabilitation

## 2016-05-30 DIAGNOSIS — R2689 Other abnormalities of gait and mobility: Secondary | ICD-10-CM | POA: Diagnosis present

## 2016-05-30 DIAGNOSIS — R29818 Other symptoms and signs involving the nervous system: Secondary | ICD-10-CM | POA: Diagnosis present

## 2016-05-30 DIAGNOSIS — M6281 Muscle weakness (generalized): Secondary | ICD-10-CM | POA: Diagnosis present

## 2016-05-30 NOTE — Therapy (Signed)
[facility] [address] [place], [place], [address] Phone: [phone]   Fax:  [phone]  Physical Therapy Evaluation  Patient Details  Name: Carl [name] MRN: [id number] Date of Birth: 1950/09/18 Referring Provider: Dr. Naaman Plummer  Encounter Date: 05/30/2016      PT End of Session - 05/30/16 1415    Visit Number 1   Number of Visits 17   Date for PT Re-Evaluation 07/29/16   Authorization Type G-CODE AND PROGRESS NOTE EVERY 10TH VISIT. Medicare primary and BCBS federal secondary.   PT Start Time 2060  pt late due to traffic   PT Stop Time 1404   PT Time Calculation (min) 42 min   Equipment Utilized During Treatment Gait belt   Activity Tolerance Patient tolerated treatment well   Behavior During Therapy WFL for tasks assessed/performed      Past Medical History:  Diagnosis Date  . Bradycardia   . Chronic abdominal pain   . Hesitancy of micturition     Past Surgical History:  Procedure Laterality Date  . ANTERIOR CERVICAL DECOMP/DISCECTOMY FUSION N/A 11/14/2014   Procedure: Cervical Five-Cervical Six  Anterior Cervical Decompression/Diskectomy/Fusion;  Surgeon: Erline Levine, MD;  Location: [facility];  Service: Neurosurgery;  Laterality: N/A;    There were no vitals filed for this visit.       Subjective Assessment - 05/30/16 1329    Subjective Pt reports his legs feel weaker since the last round of PT. Pt reports increased core/back pain, which he described as muscle cramps (spasms that don't go away). Pt reported one fall, while sweeping front porch-caught up in the rug. 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   Pertinent History Hx of C5-C6 SCI (spastic tetraplegia) incomplete, orthostatic hypotension, neurogenic bowel   Patient Stated Goals Gain strength in legs   Currently in Pain? Yes   Pain Score 4    Pain Location Back  with radiating pain to abdominals   Pain Orientation  Mid;Lower;Posterior;Anterior   Pain Descriptors / Indicators Aching   Pain Type Chronic pain   Pain Onset More than a month ago   Pain Frequency Constant   Aggravating Factors  eating and standing for prolonged periods of time (20 minutes)   Pain Relieving Factors pain medicatiions help a little bit and lying down            [facility] PT Assessment - 05/30/16 1334      Assessment   Medical Diagnosis Spastic tetraplegia;  Injury at C6 level of cervical spinal cord, sequela [facility])   Referring Provider Dr. Naaman Plummer   Onset Date/Surgical Date 04/29/16   Hand Dominance Right   Prior Therapy [facility] neuro     Precautions   Precautions Fall   Precaution Comments based on BERG and TUG      Restrictions   Weight Bearing Restrictions No     Balance Screen   Has the patient fallen in the past 6 months Yes   How many times? 1   Has the patient had a decrease in activity level because of a fear of falling?  No   Is the patient reluctant to leave their home because of a fear of falling?  No     Home Environment   Living Environment Private residence   Living Arrangements Spouse/significant other   Available Help at Discharge Family;Available 24 hours/day   Type of [place] Two level;Able to live  on main level with bedroom/bathroom   Alternate Level Stairs-Number of Steps 15   Alternate Level Stairs-Rails Left   Home Equipment Walker - 2 wheels;Shower seat;Bedside commode;Wheelchair - power     Prior Function   Level of Independence Independent with household mobility with device   Vocation Full time employment   Systems analyst: stand/sit for preaching   Leisure fishing, Event organiser, Scientist, product/process development     Cognition   Overall Cognitive Status Within Functional Limits for tasks assessed  pt reports difficulty with word recall occasionally     Sensation   Light Touch Appears Intact   Additional Comments Pt reported B N/T in B  LEs intermittent to constant (feet constant), and L hand N/T     Coordination   Gross Motor Movements are Fluid and Coordinated No   Fine Motor Movements are Fluid and Coordinated No   Coordination and Movement Description Decr. speed during finger to thumb and L heel to shin 2/2 weakness     Tone   Assessment Location Right Lower Extremity;Left Lower Extremity     ROM / Strength   AROM / PROM / Strength AROM;Strength     AROM   Overall AROM  Within functional limits for tasks performed   Overall AROM Comments BUE/LE AROM WFL     Strength   Overall Strength Deficits   Overall Strength Comments RLE grossly: 4/5. LLE: hip flex: 3+/5, knee ext: 4-/5, knee flex: 3+/5, ankle DF: 3+/5. Hip abd/add grossly assessed in seated position: 4/5. B hip ext weakness suspected 2/2 gait deviations but not formally assessed.      Transfers   Transfers Sit to Stand;Stand to Sit   Sit to Stand 5: Supervision;With upper extremity assist;Without upper extremity assist;From chair/3-in-1;Multiple attempts   Sit to Stand Details (indicate cue type and reason) Pt required several attempts to perform STS without UE support, pt was then able to perform without UE assist on first attempt, limited at first 2/2 L LE tone.   Stand to Sit 5: Supervision;With upper extremity assist;Without upper extremity assist;To chair/3-in-1     Ambulation/Gait   Ambulation/Gait Yes   Ambulation/Gait Assistance 5: Supervision   Ambulation/Gait Assistance Details S for safety   Ambulation Distance (Feet) 100 Feet   Assistive device Straight cane   Gait Pattern Step-through pattern;Decreased stride length;Decreased hip/knee flexion - left;Decreased dorsiflexion - left;Left hip hike   Ambulation Surface Level;Indoor   Gait velocity 1.59f/sec.  [facility]    Standardized Balance Assessment   Standardized Balance Assessment Berg Balance Test;Timed Up and Go Test     Berg Balance Test   Sit to Stand Able to stand  independently  using hands   Standing Unsupported Able to stand safely 2 minutes   Sitting with Back Unsupported but Feet Supported on Floor or Stool Able to sit safely and securely 2 minutes   Stand to Sit Sits safely with minimal use of hands   Transfers Able to transfer safely, minor use of hands   Standing Unsupported with Eyes Closed Able to stand 10 seconds safely   Standing Ubsupported with Feet Together Able to place feet together independently and stand for 1 minute with supervision   From Standing, Reach Forward with Outstretched Arm Can reach confidently >25 cm (10")   From Standing Position, Pick up Object from Floor Able to pick up shoe, needs supervision   From Standing Position, Turn to Look Behind Over each Shoulder Looks behind one side only/other side shows  less weight shift   Turn 360 Degrees Able to turn 360 degrees safely but slowly   Standing Unsupported, Alternately Place Feet on Step/Stool Able to stand independently and complete 8 steps >20 seconds   Standing Unsupported, One Foot in Front Able to plae foot ahead of the other independently and hold 30 seconds   Standing on One Leg Able to lift leg independently and hold > 10 seconds  R and L SLS >10 sec.   Total Score 48     Timed Up and Go Test   TUG Normal TUG   Normal TUG (seconds) 15.6  with SPC     RLE Tone   RLE Tone Modified Ashworth     RLE Tone   Modified Ashworth Scale for Grading Hypertonia RLE Slight increase in muscle tone, manifested by a catch, followed by minimal resistance throughout the remainder (less than half) of the ROM     LLE Tone   LLE Tone Modified Ashworth     LLE Tone   Modified Ashworth Scale for Grading Hypertonia LLE Considerable increase in muschle tone, passive movement difficult                           PT Education - 05/30/16 1414    Education provided Yes   Education Details PT discussed outcome measures, POC, frequency and duration.    Person(s) Educated Patient    Methods Explanation   Comprehension Verbalized understanding          PT Short Term Goals - 05/30/16 1426      PT SHORT TERM GOAL #1   Title Pt will be IND in HEP to improve balance, strength, endurance, and flexibility. TARGET DATE FOR ALL STGS: 06/27/16   Status New     PT SHORT TERM GOAL #2   Title Pt will improve TUG score to </= 13.5 secs for reduced fall risk.     Status New     PT SHORT TERM GOAL #3   Title Pt will amb. 100' without device without occurrence of L toes catching for incr. safety with household mobility.    Status New     PT SHORT TERM GOAL #4   Title Pt will improve gait speed with LRAD to >/=2.31f/sec. to safely amb. in the community.    Status New           PT Long Term Goals - 05/30/16 1428      PT LONG TERM GOAL #1   Title Pt will improve BERG score to >/=56/56 to decr. falls risk. TARGET DATE FOR ALL LTGS: 07/25/16   Status New     PT LONG TERM GOAL #2   Title Perform DGI/FGA if BERG score met early and write goal as indicated.    Status New     PT LONG TERM GOAL #3   Title Perform 6 MWT and write goal as indicated.   Status New     PT LONG TERM GOAL #4   Title Pt will amb. 500' over even/uneven terrain with LRAD, at MOD I level, to improve functional mobility.   Status New               Plan - 05/30/16 1418    Clinical Impression Statement Pt is a pleasant 65y/o male presenting to [facility] neuro with spastic tetraplegia and SCI injury at C6 level. Pt is familar to this clinic, as he has received therapy services at our  office twice since onset of injury (fall from roof on 11/09/14). Pt's PMH significant for the following: Hx of C5-C6 SCI (spastic tetraplegia) incomplete, orthostatic hypotension, neurogenic bowel. The following deficits were found during PT exam: gait devaitions, decr. strength, impaired balance, spasticity, decr. coordination, and decreased endurance. Pt also c/o pain, PT will not directly treat but will continue to  monitor closely. Pt's gait speed is slightly above falls risk cut-off of 1.84f/sec. and below community ambulation gait speed of 2.625fsec. Pt's BERG score and TUG time indicate pt is at risk for falls. Pt denied difficulty with bathing, dressing, eating at this time. Pt would benefit from skilled PT to improve safety during functional mobility.    Rehab Potential Good   Clinical Impairments Affecting Rehab Potential see above   PT Frequency 2x / week   PT Duration 8 weeks   PT Treatment/Interventions ADLs/Self Care Home Management;Biofeedback;Electrical Stimulation;Neuromuscular re-education;Balance training;Therapeutic exercise;Therapeutic activities;Functional mobility training;Stair training;Gait training;Orthotic Fit/Training;DME Instruction;Patient/family education;Vestibular   PT Next Visit Plan Perform 6MWT and write goal. Initiate balance, strength, flexbility HEP.   Consulted and Agree with Plan of Care Patient      Patient will benefit from skilled therapeutic intervention in order to improve the following deficits and impairments:  Abnormal gait, Decreased endurance, Decreased knowledge of use of DME, Decreased strength, Decreased balance, Decreased mobility, Impaired flexibility, Decreased coordination, Pain, Impaired tone  Visit Diagnosis: Other abnormalities of gait and mobility - Plan: PT plan of care cert/re-cert  Muscle weakness (generalized) - Plan: PT plan of care cert/re-cert  Other symptoms and signs involving the nervous system - Plan: PT plan of care cert/re-cert      G-Codes - 0293/81/82430    Functional Assessment Tool Used (Outpatient Only) BERG: 48/56; gait speed with SPC: 1.9660fec.; TUG with SPC: 15.6sec.   Functional Limitation Mobility: Walking and moving around   Mobility: Walking and Moving Around Current Status ([id number] least 40 percent but less than 60 percent impaired, limited or restricted   Mobility: Walking and Moving Around Goal Status ([id number]  least 1 percent but less than 20 percent impaired, limited or restricted       Problem List Patient Active Problem List   Diagnosis Date Noted  . Reactive depression 05/19/2016  . 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 Orthostatic hypotension 11/30/2014  . C6 spinal cord injury ([facility]/15/2016  . Neurogenic bowel 11/20/2014  . Neurogenic bladder 11/20/2014  . Fever of unknown origin   . Spastic tetraplegia ([facility]8/12/2014  . C5 vertebral fracture ([facility]CC[place]8/07/2014    Jamiere Gulas L 05/30/2016, 2:31 PM  [place]iK[place]one: [phone]:  [phone]: TonDaevion NavaretteN: [id number] of Birth: [birth year]JenGeoffry ParadiseT,DPT 05/30/16 2:32 PM Phone: [phone]: [phone]

## 2016-06-09 ENCOUNTER — Ambulatory Visit: Payer: Medicare Other | Attending: Physical Medicine & Rehabilitation | Admitting: Physical Therapy

## 2016-06-09 ENCOUNTER — Encounter: Payer: Self-pay | Admitting: Physical Therapy

## 2016-06-09 DIAGNOSIS — M6281 Muscle weakness (generalized): Secondary | ICD-10-CM | POA: Diagnosis present

## 2016-06-09 DIAGNOSIS — R2689 Other abnormalities of gait and mobility: Secondary | ICD-10-CM | POA: Insufficient documentation

## 2016-06-09 DIAGNOSIS — R29818 Other symptoms and signs involving the nervous system: Secondary | ICD-10-CM | POA: Diagnosis present

## 2016-06-09 NOTE — Patient Instructions (Signed)
Provided with supine postural exercises: head press, shoulder press, green theraband bilat UE flexion, horizontal ABD, ER

## 2016-06-09 NOTE — Therapy (Signed)
[facility] [address] [place], [place], [address] Phone: [phone]   Fax:  [phone]  Physical Therapy Treatment  Patient Details  Name: Carl [name] MRN: [id number] Date of Birth: 1951/02/27 Referring Provider: Dr. Naaman Plummer  Encounter Date: 06/09/2016      PT End of Session - 06/09/16 1606    Visit Number 2   Number of Visits 17   Date for PT Re-Evaluation 07/29/16   Authorization Type G-CODE AND PROGRESS NOTE EVERY 10TH VISIT. Medicare primary and BCBS federal secondary.   PT Start Time 1318   PT Stop Time 1404   PT Time Calculation (min) 46 min   Activity Tolerance Patient tolerated treatment well   Behavior During Therapy WFL for tasks assessed/performed      Past Medical History:  Diagnosis Date  . Bradycardia   . Chronic abdominal pain   . Hesitancy of micturition     Past Surgical History:  Procedure Laterality Date  . ANTERIOR CERVICAL DECOMP/DISCECTOMY FUSION N/A 11/14/2014   Procedure: Cervical Five-Cervical Six  Anterior Cervical Decompression/Diskectomy/Fusion;  Surgeon: Erline Levine, MD;  Location: [facility];  Service: Neurosurgery;  Laterality: N/A;    There were no vitals filed for this visit.      Subjective Assessment - 06/09/16 1323    Subjective Pt still reporting pain and cramping in back and mid section.  No other issues.     Pertinent History Hx of C5-C6 SCI (spastic tetraplegia) incomplete, orthostatic hypotension, neurogenic bowel   Patient Stated Goals Gain strength in legs   Currently in Pain? Yes   Pain Score 4    Pain Location Back   Pain Orientation Lower   Pain Descriptors / Indicators Spasm   Pain Type Chronic pain            [facility] PT Assessment - 06/09/16 1326      6 Minute Walk- Baseline   6 Minute Walk- Baseline yes   BP (mmHg) 104/60   HR (bpm) 51   02 Sat (%RA) 98 %   Modified Borg Scale for Dyspnea 0- Nothing at all   Perceived Rate of Exertion  (Borg) 6-     6 Minute walk- Post Test   6 Minute Walk Post Test yes   BP (mmHg) 118/63   HR (bpm) 56   02 Sat (%RA) 97 %   Modified Borg Scale for Dyspnea 1- Very mild shortness of breath   Perceived Rate of Exertion (Borg) 13- Somewhat hard     6 minute walk test results    Aerobic Endurance Distance Walked 683   Endurance additional comments with [facility]          [facility] Adult PT Treatment/Exercise - 06/09/16 1602      Exercises   Exercises Shoulder;Neck     Neck Exercises: Theraband   Shoulder External Rotation 10 reps;Green   Shoulder External Rotation Limitations supine   Horizontal ABduction 10 reps;Green   Horizontal ABduction Limitations supine     Neck Exercises: Supine   Neck Retraction 10 reps;5 secs   Neck Retraction Limitations Supine     Shoulder Exercises: Supine   External Rotation Strengthening;Both;10 reps;Theraband   Theraband Level (Shoulder External Rotation) Level 3 (Green)   Flexion Strengthening;Both;10 reps;Theraband   Theraband Level (Shoulder Flexion) Level 3 Nyoka Cowden)            PT Education - 06/09/16 1605    Education provided Yes   Education Details HEP, posture and importance  for breath support, endurance training   Person(s) Educated Patient   Methods Explanation;Demonstration;Handout   Comprehension Verbalized understanding;Returned demonstration          PT Short Term Goals - 06/09/16 1609      PT SHORT TERM GOAL #1   Title Pt will be IND in HEP to improve balance, strength, endurance, and flexibility. TARGET DATE FOR ALL STGS: 06/27/16   Status New     PT SHORT TERM GOAL #2   Title Pt will improve TUG score to </= 13.5 secs for reduced fall risk.     Status New     PT SHORT TERM GOAL #3   Title Pt will amb. 100' without device without occurrence of L toes catching for incr. safety with household mobility.    Status New     PT SHORT TERM GOAL #4   Title Pt will improve gait speed with LRAD to >/=2.54f/sec. to safely  amb. in the community.    Status New           PT Long Term Goals - 06/09/16 1609      PT LONG TERM GOAL #1   Title Pt will improve BERG score to >/=56/56 to decr. falls risk. TARGET DATE FOR ALL LTGS: 07/25/16   Status New     PT LONG TERM GOAL #2   Title Perform DGI/FGA if BERG score met early and write goal as indicated.    Status New     PT LONG TERM GOAL #3   Title Perform 6 MWT and write goal as indicated.   Baseline Met 06/09/2016 and goal written below   Status Achieved     PT LONG TERM GOAL #4   Title Pt will amb. 500' over even/uneven terrain with LRAD, at MOD I level, to improve functional mobility.   Status New     PT LONG TERM GOAL #5   Title Pt will improve walking distance during 6 MWT by 200 feet while performing gait at moderate intensity and mild-moderate DOE.   Baseline 683 feet   Status New               Plan - 06/09/16 1606    Clinical Impression Statement Treatment session focused on assessment of endurance with 6 MWT with pt demonstrating below average distance for his age.  Initiated HEP with focus on stretching and increasing ROM in anterior chest and shoulder and strengthening anterior neck, core and shoulder/scapular mm for improved posture to improve breath support during mobility.     PT Treatment/Interventions ADLs/Self Care Home Management;Biofeedback;Electrical Stimulation;Neuromuscular re-education;Balance training;Therapeutic exercise;Therapeutic activities;Functional mobility training;Stair training;Gait training;Orthotic Fit/Training;DME Instruction;Patient/family education;Vestibular   PT Next Visit Plan Continue to review postural HEP, blow up balloon, endurance, gait   Consulted and Agree with Plan of Care Patient      Patient will benefit from skilled therapeutic intervention in order to improve the following deficits and impairments:  Abnormal gait, Decreased endurance, Decreased knowledge of use of DME, Decreased strength,  Decreased balance, Decreased mobility, Impaired flexibility, Decreased coordination, Pain, Impaired tone  Visit Diagnosis: Other abnormalities of gait and mobility  Muscle weakness (generalized)  Other symptoms and signs involving the nervous system     Problem List Patient Active Problem List   Diagnosis Date Noted  . Reactive depression 05/19/2016  . 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 Orthostatic hypotension 11/30/2014  . C6 spinal cord injury ([place] 11/20/2014  . Neurogenic bowel 11/20/2014  . Neurogenic bladder 11/20/2014  . Fever  of unknown origin   . Spastic tetraplegia ([place]) 11/14/2014  . C5 vertebral fracture ([place]) 11/09/2014    Raylene Everts, PT, DPT 06/09/16    4:13 PM    [facility] [address] [place], [place], [address] Phone: [phone]   Fax:  [phone]  Name: Jayleon Mcfarlane MRN: [id number] Date of Birth: Mar 15, 1951

## 2016-06-12 ENCOUNTER — Ambulatory Visit: Payer: Medicare Other | Admitting: Physical Therapy

## 2016-06-12 ENCOUNTER — Encounter: Payer: Self-pay | Admitting: Physical Therapy

## 2016-06-12 DIAGNOSIS — M6281 Muscle weakness (generalized): Secondary | ICD-10-CM

## 2016-06-12 DIAGNOSIS — R2689 Other abnormalities of gait and mobility: Secondary | ICD-10-CM | POA: Diagnosis not present

## 2016-06-12 DIAGNOSIS — R29818 Other symptoms and signs involving the nervous system: Secondary | ICD-10-CM

## 2016-06-12 NOTE — Therapy (Signed)
[facility] [address] [place], [place], [address] Phone: [phone]   Fax:  [phone]  Physical Therapy Treatment  Patient Details  Name: Carl [name] MRN: [id number] Date of Birth: 06/02/1950 Referring Provider: Dr. Swartz  Encounter Date: 06/12/2016      PT End of Session - 06/12/16 1543    Visit Number 3   Number of Visits 17   Date for PT Re-Evaluation 07/29/16   Authorization Type G-CODE AND PROGRESS NOTE EVERY 10TH VISIT. Medicare primary and BCBS federal secondary.   PT Start Time 1450   PT Stop Time 1532   PT Time Calculation (min) 42 min   Activity Tolerance Patient tolerated treatment well   Behavior During Therapy WFL for tasks assessed/performed      Past Medical History:  Diagnosis Date  . Bradycardia   . Chronic abdominal pain   . Hesitancy of micturition     Past Surgical History:  Procedure Laterality Date  . ANTERIOR CERVICAL DECOMP/DISCECTOMY FUSION N/A 11/14/2014   Procedure: Cervical Five-Cervical Six  Anterior Cervical Decompression/Diskectomy/Fusion;  Surgeon: Joseph Stern, MD;  Location: [facility];  Service: Neurosurgery;  Laterality: N/A;    There were no vitals filed for this visit.      Subjective Assessment - 06/12/16 1457    Subjective Still reporting some soreness in UE, back and side.  No issues with exercises at home.   Pertinent History Hx of C5-C6 SCI (spastic tetraplegia) incomplete, orthostatic hypotension, neurogenic bowel   Patient Stated Goals Gain strength in legs   Currently in Pain? Yes           [facility] Adult PT Treatment/Exercise - 06/12/16 1458      Exercises   Exercises Knee/Hip;Lumbar     Neck Exercises: Machines for Strengthening   UBE (Upper Arm Bike) L 2.8 x 3 min forwards, 3 min back; HR:58 afterwards     Lumbar Exercises: Stretches   Pelvic Tilt Other (comment)   Pelvic Tilt Limitations 10 reps anterior<>posterior tilt seated on therapy  ball     Knee/Hip Exercises: Standing   Forward Step Up Right;Left;Hand Hold: 2;Step Height: 6"  alternating, 12 reps   SLS no UE support while performing alternating taps to 6" step x 12 reps, min A   Gait Training Retro gait with focus on lateral weight shifting and full hip extension x 20' with min A     Knee/Hip Exercises: Seated   Long Arc Quad Right;Left;10 reps;Other (comment)   Long Arc Quad Limitations seated on therapy ball, min A for balance   Marching Right;Left;10 reps   Marching Limitations seated on therapy ball, min A for balance   Sit to Sand 5 reps;Other (comment)  2 sets, from therapy ball     Shoulder Exercises: Therapy Ball   Right/Left Other (comment)  12 reps each exercise   Right/Left Limitations extensions, upright rows, rows, external rotation, tricep extensions with green theraband and min A     Shoulder Exercises: Stretch   Corner Stretch 2 reps;30 seconds          PT Short Term Goals - 06/09/16 1609      PT SHORT TERM GOAL #1   Title Pt will be IND in HEP to improve balance, strength, endurance, and flexibility. TARGET DATE FOR ALL STGS: 06/27/16   Status New     PT SHORT TERM GOAL #2   Title Pt will improve TUG score to </= 13.5 secs for reduced fall risk.       Status New     PT SHORT TERM GOAL #3   Title Pt will amb. 100' without device without occurrence of L toes catching for incr. safety with household mobility.    Status New     PT SHORT TERM GOAL #4   Title Pt will improve gait speed with LRAD to >/=2.62ft/sec. to safely amb. in the community.    Status New           PT Long Term Goals - 06/09/16 1609      PT LONG TERM GOAL #1   Title Pt will improve BERG score to >/=56/56 to decr. falls risk. TARGET DATE FOR ALL LTGS: 07/25/16   Status New     PT LONG TERM GOAL #2   Title Perform DGI/FGA if BERG score met early and write goal as indicated.    Status New     PT LONG TERM GOAL #3   Title Perform 6 MWT and write goal as  indicated.   Baseline Met 06/09/2016 and goal written below   Status Achieved     PT LONG TERM GOAL #4   Title Pt will amb. 500' over even/uneven terrain with LRAD, at MOD I level, to improve functional mobility.   Status New     PT LONG TERM GOAL #5   Title Pt will improve walking distance during 6 MWT by 200 feet while performing gait at moderate intensity and mild-moderate DOE.   Baseline 683 feet   Status New            Plan - 06/12/16 1544    Clinical Impression Statement Continued to focus on ROM and strength in core and trunk seated on therapy ball with dynamic UE and LE movements as well as dynamic standing balance and strengthening of UE and LE.  Pt tolerated well; will continue to progress towards LTG.   PT Treatment/Interventions ADLs/Self Care Home Management;Biofeedback;Electrical Stimulation;Neuromuscular re-education;Balance training;Therapeutic exercise;Therapeutic activities;Functional mobility training;Stair training;Gait training;Orthotic Fit/Training;DME Instruction;Patient/family education;Vestibular   PT Next Visit Plan Continue to review postural HEP, blow up balloon, endurance, gait and balance   Consulted and Agree with Plan of Care Patient      Patient will benefit from skilled therapeutic intervention in order to improve the following deficits and impairments:  Abnormal gait, Decreased endurance, Decreased knowledge of use of DME, Decreased strength, Decreased balance, Decreased mobility, Impaired flexibility, Decreased coordination, Pain, Impaired tone  Visit Diagnosis: Other abnormalities of gait and mobility  Muscle weakness (generalized)  Other symptoms and signs involving the nervous system     Problem List Patient Active Problem List   Diagnosis Date Noted  . Reactive depression 05/19/2016  . 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 Orthostatic hypotension 11/30/2014  . C6 spinal cord injury ([facility]) 11/20/2014  . Neurogenic bowel 11/20/2014  .  Neurogenic bladder 11/20/2014  . Fever of unknown origin   . 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 C5 vertebral fracture (HCC) 11/09/2014   Audra Hall, PT, DPT 06/12/16    3:48 PM    [facility] [address] [place], [place], [address] Phone: [phone]   Fax:  [phone]  Name: Carl [name] MRN: [id number] Date of Birth: 09/24/1950   

## 2016-06-16 ENCOUNTER — Ambulatory Visit: Payer: Medicare Other | Admitting: Physical Therapy

## 2016-06-19 ENCOUNTER — Ambulatory Visit: Payer: Medicare Other | Admitting: Physical Therapy

## 2016-06-21 ENCOUNTER — Other Ambulatory Visit: Payer: Self-pay | Admitting: Physical Medicine & Rehabilitation

## 2016-06-21 DIAGNOSIS — S14106S Unspecified injury at C6 level of cervical spinal cord, sequela: Secondary | ICD-10-CM

## 2016-06-21 DIAGNOSIS — G8250 Quadriplegia, unspecified: Secondary | ICD-10-CM

## 2016-06-21 DIAGNOSIS — K592 Neurogenic bowel, not elsewhere classified: Secondary | ICD-10-CM

## 2016-06-21 DIAGNOSIS — N319 Neuromuscular dysfunction of bladder, unspecified: Secondary | ICD-10-CM

## 2016-06-23 ENCOUNTER — Ambulatory Visit: Payer: Medicare Other | Admitting: Physical Therapy

## 2016-06-23 DIAGNOSIS — R2689 Other abnormalities of gait and mobility: Secondary | ICD-10-CM

## 2016-06-23 DIAGNOSIS — M6281 Muscle weakness (generalized): Secondary | ICD-10-CM

## 2016-06-23 NOTE — Therapy (Signed)
[facility] [address] [place], [place], [address] Phone: [phone]   Fax:  [phone]  Physical Therapy Treatment  Patient Details  Name: Carl [name] MRN: [id number] Date of Birth: 07/21/1950 Referring Provider: Dr. Naaman Plummer  Encounter Date: 06/23/2016      PT End of Session - 06/23/16 2056    Visit Number 4   Number of Visits 17   Date for PT Re-Evaluation 07/29/16   Authorization Type G-CODE AND PROGRESS NOTE EVERY 10TH VISIT. Medicare primary and BCBS federal secondary.   PT Start Time 1315   PT Stop Time 1401   PT Time Calculation (min) 46 min      Past Medical History:  Diagnosis Date  . Bradycardia   . Chronic abdominal pain   . Hesitancy of micturition     Past Surgical History:  Procedure Laterality Date  . ANTERIOR CERVICAL DECOMP/DISCECTOMY FUSION N/A 11/14/2014   Procedure: Cervical Five-Cervical Six  Anterior Cervical Decompression/Diskectomy/Fusion;  Surgeon: Erline Levine, MD;  Location: [facility];  Service: Neurosurgery;  Laterality: N/A;    There were no vitals filed for this visit.      Subjective Assessment - 06/23/16 2046    Subjective Pt reports he feels tightness & soreness in his back and legs; reports doing the exercises he has been given   Pertinent History Hx of C5-C6 SCI (spastic tetraplegia) incomplete, orthostatic hypotension, neurogenic bowel   Patient Stated Goals Gain strength in legs                         [facility] Adult PT Treatment/Exercise - 06/23/16 1328      Transfers   Transfers Sit to Stand   Sit to Stand 5: Supervision   Number of Reps Other reps (comment)  1     Lumbar Exercises: Stretches   Single Knee to Chest Stretch 1 rep;20 seconds   Double Knee to Chest Stretch 1 rep;30 seconds   Lower Trunk Rotation 1 rep;30 seconds  for Rt and Lt sides   Quadruped Mid Back Stretch 2 reps;10 seconds  "Cat/Camel" exercise      Lumbar  Exercises: Machines for Strengthening   Leg Press 70# bil. LE's 15 reps:  LLE only 40# 20 reps:  RLE only 40# 20 reps     Lumbar Exercises: Supine   Bridge 5 reps   Other Supine Lumbar Exercises Pt performed bridging with hip abduction/adduction x 10 reps;  bridging with LE extension 5 reps each LE:  bridging with marching 10 reps     Knee/Hip Exercises: Stretches   Active Hamstring Stretch Both;1 rep;30 seconds  Runner's stretch   Gastroc Stretch Both;1 rep;30 seconds  using a 2" block     Knee/Hip Exercises: Aerobic   Recumbent Bike SciFit Level 4.0 x 5" with UE's and LE's     Knee/Hip Exercises: Standing   Forward Step Up Both;1 set;10 reps;Hand Hold: 2;Step Height: 6"     Pt performed trunk stretching by sitting back toward heels from quadruped position - arms extended overhead - 30 sec hold  NeuroRe-ed:  Alternate tap ups to 4" step 10 reps each, then tapping opposite corner of 4" step with each foot 10 reps each With UE support prn Tapping 2nd, 3rd step with each foot 10 reps each with UE support prn 2# weight placed on RLE for tap ups to 2nd/3rd step for hip flexor strengthening Side stepping 10' x 2 reps inside //  bars; crossovers with each foot 3 reps each LE - standing in place inside // bars              PT Short Term Goals - 06/09/16 1609      PT SHORT TERM GOAL #1   Title Pt will be IND in HEP to improve balance, strength, endurance, and flexibility. TARGET DATE FOR ALL STGS: 06/27/16   Status New     PT SHORT TERM GOAL #2   Title Pt will improve TUG score to </= 13.5 secs for reduced fall risk.     Status New     PT SHORT TERM GOAL #3   Title Pt will amb. 100' without device without occurrence of L toes catching for incr. safety with household mobility.    Status New     PT SHORT TERM GOAL #4   Title Pt will improve gait speed with LRAD to >/=2.20f/sec. to safely amb. in the community.    Status New           PT Long Term Goals - 06/09/16 1609       PT LONG TERM GOAL #1   Title Pt will improve BERG score to >/=56/56 to decr. falls risk. TARGET DATE FOR ALL LTGS: 07/25/16   Status New     PT LONG TERM GOAL #2   Title Perform DGI/FGA if BERG score met early and write goal as indicated.    Status New     PT LONG TERM GOAL #3   Title Perform 6 MWT and write goal as indicated.   Baseline Met 06/09/2016 and goal written below   Status Achieved     PT LONG TERM GOAL #4   Title Pt will amb. 500' over even/uneven terrain with LRAD, at MOD I level, to improve functional mobility.   Status New     PT LONG TERM GOAL #5   Title Pt will improve walking distance during 6 MWT by 200 feet while performing gait at moderate intensity and mild-moderate DOE.   Baseline 683 feet   Status New               Plan - 06/23/16 2056    Clinical Impression Statement Pt's LLE weaker than RLE and pt demonstrated slightly less coordination with Lt foot crossover taps on 4" step.  Pt also demonstrates less Lt knee flexion in swing phase of gait but able to increase knee flexion with cues and consciously thinking of gait pattern.  Pt's mobility is limited by spasticity resulting in less fluidity with movement.                                                                                                  Rehab Potential Good   Clinical Impairments Affecting Rehab Potential see above   PT Frequency 2x / week   PT Duration 8 weeks   PT Treatment/Interventions ADLs/Self Care Home Management;Biofeedback;Electrical Stimulation;Neuromuscular re-education;Balance training;Therapeutic exercise;Therapeutic activities;Functional mobility training;Stair training;Gait training;Orthotic Fit/Training;DME Instruction;Patient/family education;Vestibular   PT Next Visit Plan Cont with stretches, gait and balance and LE strengthening  Consulted and Agree with Plan of Care Patient      Patient will benefit from skilled therapeutic intervention in order to improve  the following deficits and impairments:  Abnormal gait, Decreased endurance, Decreased knowledge of use of DME, Decreased strength, Decreased balance, Decreased mobility, Impaired flexibility, Decreased coordination, Pain, Impaired tone  Visit Diagnosis: Other abnormalities of gait and mobility  Muscle weakness (generalized)     Problem List Patient Active Problem List   Diagnosis Date Noted  . Reactive depression 05/19/2016  . 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 Orthostatic hypotension 11/30/2014  . C6 spinal cord injury ([facility]) 11/20/2014  . Neurogenic bowel 11/20/2014  . Neurogenic bladder 11/20/2014  . Fever of unknown origin   . Spastic tetraplegia ([facility]) 11/14/2014  . C5 vertebral fracture ([facility]) 11/09/2014    Jeter Tomey, [place], PT 06/23/2016, 9:06 PM  [facility] [address] [place], [place], [address] Phone: [phone]   Fax:  [phone]  Name: Stella Encarnacion MRN: [id number] Date of Birth: 11-Oct-1950

## 2016-06-26 ENCOUNTER — Ambulatory Visit: Payer: Medicare Other | Admitting: Physical Therapy

## 2016-06-26 ENCOUNTER — Encounter: Payer: Self-pay | Admitting: Physical Therapy

## 2016-06-26 DIAGNOSIS — M6281 Muscle weakness (generalized): Secondary | ICD-10-CM

## 2016-06-26 DIAGNOSIS — R2689 Other abnormalities of gait and mobility: Secondary | ICD-10-CM | POA: Diagnosis not present

## 2016-06-26 DIAGNOSIS — R29818 Other symptoms and signs involving the nervous system: Secondary | ICD-10-CM

## 2016-06-26 NOTE — Patient Instructions (Signed)
FLEXION: Standing - Stable (Active)    Stand, both feet flat. Bend right knee, bringing heel toward buttocks.  Complete _2_ sets of _12__ repetitions on each leg. Perform _2__ sessions per day.  http://gtsc.exer.us/240   Copyright  VHI. All rights reserved.   Back Leg Kick    Swing leg back as far as possible. Return to center. Repeat with other leg. Repeat __12__ times each leg. Do __2__ sessions per day.  http://gt2.exer.us/483   Copyright  VHI. All rights reserved.

## 2016-06-26 NOTE — Therapy (Addendum)
[facility] [address] [place], [place], [address] Phone: [phone]   Fax:  [phone]  Physical Therapy Treatment  Patient Details  Name: Carl [name] MRN: [id number] Date of Birth: 04-Jul-1950 Referring Provider: Dr. Naaman Plummer  Encounter Date: 06/26/2016      PT End of Session - 06/26/16 1642    Visit Number 5   Number of Visits 17   Date for PT Re-Evaluation 07/29/16   Authorization Type G-CODE AND PROGRESS NOTE EVERY 10TH VISIT. Medicare primary and BCBS federal secondary.   PT Start Time 1406   PT Stop Time 1446   PT Time Calculation (min) 40 min   Activity Tolerance Patient tolerated treatment well   Behavior During Therapy WFL for tasks assessed/performed      Past Medical History:  Diagnosis Date  . Bradycardia   . Chronic abdominal pain   . Hesitancy of micturition     Past Surgical History:  Procedure Laterality Date  . ANTERIOR CERVICAL DECOMP/DISCECTOMY FUSION N/A 11/14/2014   Procedure: Cervical Five-Cervical Six  Anterior Cervical Decompression/Diskectomy/Fusion;  Surgeon: Erline Levine, MD;  Location: [facility];  Service: Neurosurgery;  Laterality: N/A;    There were no vitals filed for this visit.      Subjective Assessment - 06/26/16 1414    Subjective Reporting feeling a little stiff today; no issues with exercises at home   Pertinent History Hx of C5-C6 SCI (spastic tetraplegia) incomplete, orthostatic hypotension, neurogenic bowel   Patient Stated Goals Gain strength in legs   Currently in Pain? No/denies            [facility] PT Assessment - 06/26/16 1417      Standardized Balance Assessment   Standardized Balance Assessment Berg Balance Test;Timed Up and Go Test;10 meter walk test   10 Meter Walk 15 seconds or 2.18 ft/sec     Berg Balance Test   Sit to Stand Able to stand without using hands and stabilize independently   Standing Unsupported Able to stand safely 2 minutes    Sitting with Back Unsupported but Feet Supported on Floor or Stool Able to sit safely and securely 2 minutes   Stand to Sit Sits safely with minimal use of hands   Transfers Able to transfer safely, minor use of hands   Standing Unsupported with Eyes Closed Able to stand 10 seconds safely   Standing Ubsupported with Feet Together Able to place feet together independently and stand 1 minute safely   From Standing, Reach Forward with Outstretched Arm Can reach confidently >25 cm (10")   From Standing Position, Pick up Object from Floor Able to pick up shoe safely and easily   From Standing Position, Turn to Look Behind Over each Shoulder Looks behind from both sides and weight shifts well   Turn 360 Degrees Able to turn 360 degrees safely one side only in 4 seconds or less   Standing Unsupported, Alternately Place Feet on Step/Stool Able to stand independently and safely and complete 8 steps in 20 seconds   Standing Unsupported, One Foot in Front Able to place foot tandem independently and hold 30 seconds   Standing on One Leg Able to lift leg independently and hold > 10 seconds   Total Score 55   Berg comment: 55/56 low falls risk     Timed Up and Go Test   TUG Normal TUG   Normal TUG (seconds) 13.5  with SPC   TUG Comments threshold for falls risk  [facility] Adult PT Treatment/Exercise - 06/26/16 1415      Ambulation/Gait   Ambulation/Gait Yes   Ambulation/Gait Assistance 5: Supervision   Ambulation/Gait Assistance Details no episodes of L foot/toe catching on tile or carpet   Ambulation Distance (Feet) 200 Feet   Assistive device None   Gait Pattern Step-through pattern;Decreased stride length;Decreased hip/knee flexion - left;Left hip hike   Ambulation Surface Level;Indoor  tile and carpet     Knee/Hip Exercises: Aerobic   Recumbent Bike SciFit Level 4.0 x 6 minutes with UE's and LE's     Knee/Hip Exercises: Standing   Knee Flexion Strengthening;Right;Left;10 reps    Knee Flexion Limitations bilat UE support, hamstring curl   Hip Extension Stengthening;Right;Left;10 reps   Extension Limitations bilat UE support           PT Education - 06/26/16 1641    Education provided Yes   Education Details LE HEP   Person(s) Educated Patient   Methods Explanation;Demonstration;Handout   Comprehension Verbalized understanding          PT Short Term Goals - 06/26/16 1414      PT SHORT TERM GOAL #1   Title Pt will be IND in HEP to improve balance, strength, endurance, and flexibility. TARGET DATE FOR ALL STGS: 06/27/16   Status Achieved     PT SHORT TERM GOAL #2   Title Pt will improve TUG score to </= 13.5 secs for reduced fall risk.     Baseline 13.5 on 3/22-MET   Status Achieved     PT SHORT TERM GOAL #3   Title Pt will amb. 100' without device without occurrence of L toes catching for incr. safety with household mobility.    Baseline 200' over tile and carpet without SPC and no L toe catching   Status Achieved     PT SHORT TERM GOAL #4   Title Pt will improve gait speed with LRAD to >/=2.53f/sec. to safely amb. in the community.    Baseline 2.18 ft/sec on 06/26/16-PARTIALLY MET   Status Partially Met           PT Long Term Goals - 06/26/16 1434      PT LONG TERM GOAL #1   Title Pt will improve BERG score to >/=56/56 to decr. falls risk. TARGET DATE FOR ALL LTGS: 07/25/16   Baseline 55/56 on 3/22   Status On-going     PT LONG TERM GOAL #2   Title Perform DGI/FGA if BERG score met early and write goal as indicated.    Status New     PT LONG TERM GOAL #3   Title Perform 6 MWT and write goal as indicated.   Baseline Met 06/09/2016 and goal written below   Status Achieved     PT LONG TERM GOAL #4   Title Pt will amb. 500' over even/uneven terrain with LRAD, at MOD I level, to improve functional mobility.   Status New     PT LONG TERM GOAL #5   Title Pt will improve walking distance during 6 MWT by 200 feet while performing gait at  moderate intensity and mild-moderate DOE.   Baseline 683 feet   Status New               Plan - 06/26/16 1646    Clinical Impression Statement Session today with focus on LE strengthening, addition of LE exercises to HEP and re-assessment of STG to determine pt progress.  Pt has met 3/4 LTG and is demonstrating  improvements in balance, gait velocity and safety with gait.  Will continue to address to make progress towards targeted goals.     Clinical Impairments Affecting Rehab Potential see above   PT Treatment/Interventions ADLs/Self Care Home Management;Biofeedback;Electrical Stimulation;Neuromuscular re-education;Balance training;Therapeutic exercise;Therapeutic activities;Functional mobility training;Stair training;Gait training;Orthotic Fit/Training;DME Instruction;Patient/family education;Vestibular   PT Next Visit Plan Review HEP and add LE exercises as able; continue with stretches, gait and dynamic balance   Consulted and Agree with Plan of Care Patient      Patient will benefit from skilled therapeutic intervention in order to improve the following deficits and impairments:  Abnormal gait, Decreased endurance, Decreased knowledge of use of DME, Decreased strength, Decreased balance, Decreased mobility, Impaired flexibility, Decreased coordination, Pain, Impaired tone  Visit Diagnosis: Other abnormalities of gait and mobility  Muscle weakness (generalized)  Other symptoms and signs involving the nervous system     Problem List Patient Active Problem List   Diagnosis Date Noted  . Reactive depression 05/19/2016  . 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 Orthostatic hypotension 11/30/2014  . C6 spinal cord injury ([facility]) 11/20/2014  . Neurogenic bowel 11/20/2014  . Neurogenic bladder 11/20/2014  . Fever of unknown origin   . Spastic tetraplegia ([facility]) 11/14/2014  . C5 vertebral fracture ([facility]) 11/09/2014   Raylene Everts, PT, DPT 06/26/16    5:03 PM    [facility] [address] [place], [place], [address] Phone: [phone]   Fax:  [phone]  Name: Burk Hoctor MRN: [id number] Date of Birth: 12-16-1950

## 2016-06-30 ENCOUNTER — Other Ambulatory Visit: Payer: Self-pay | Admitting: Physical Medicine & Rehabilitation

## 2016-06-30 ENCOUNTER — Ambulatory Visit: Payer: Medicare Other | Admitting: Physical Therapy

## 2016-06-30 DIAGNOSIS — R2689 Other abnormalities of gait and mobility: Secondary | ICD-10-CM

## 2016-06-30 DIAGNOSIS — G8250 Quadriplegia, unspecified: Secondary | ICD-10-CM

## 2016-06-30 DIAGNOSIS — K592 Neurogenic bowel, not elsewhere classified: Secondary | ICD-10-CM

## 2016-06-30 DIAGNOSIS — S14106S Unspecified injury at C6 level of cervical spinal cord, sequela: Secondary | ICD-10-CM

## 2016-06-30 DIAGNOSIS — N319 Neuromuscular dysfunction of bladder, unspecified: Secondary | ICD-10-CM

## 2016-06-30 DIAGNOSIS — M6281 Muscle weakness (generalized): Secondary | ICD-10-CM

## 2016-06-30 NOTE — Therapy (Signed)
[facility] [address] [place], [place], [address] Phone: [phone]   Fax:  [phone]  Physical Therapy Treatment  Patient Details  Name: Carl [name] MRN: [id number] Date of Birth: 1950/07/11 Referring Provider: Dr. Naaman [place]  Encounter Date: 06/30/2016      PT End of Session - 06/30/16 1601    Visit Number 6   Number of Visits 17   Date for PT Re-Evaluation 07/29/16   Authorization Type G-CODE AND PROGRESS NOTE EVERY 10TH VISIT. Medicare primary and BCBS federal secondary.   PT Start Time 1315   PT Stop Time 1401   PT Time Calculation (min) 46 min      Past Medical History:  Diagnosis Date  . Bradycardia   . Chronic abdominal pain   . Hesitancy of micturition     Past Surgical History:  Procedure Laterality Date  . ANTERIOR CERVICAL DECOMP/DISCECTOMY FUSION N/A 11/14/2014   Procedure: Cervical Five-Cervical Six  Anterior Cervical Decompression/Diskectomy/Fusion;  Surgeon: Erline Levine, MD;  Location: [facility];  Service: Neurosurgery;  Laterality: N/A;    There were no vitals filed for this visit.      Subjective Assessment - 06/30/16 1551    Subjective Pt reports he has done new exercises given last Thursday - has no problems or questions about them   Pertinent History Hx of C5-C6 SCI (spastic tetraplegia) incomplete, orthostatic hypotension, neurogenic bowel   Patient Stated Goals Gain strength in legs   Currently in Pain? Yes   Pain Score 4    Pain Location Back   Pain Orientation Lower   Pain Descriptors / Indicators Tightness;Spasm   Pain Type Chronic pain   Pain Onset More than a month ago   Pain Frequency Constant   Multiple Pain Sites Yes   Pain Score 4   Pain Location Arm   Pain Orientation Left   Pain Descriptors / Indicators Tightness;Spasm   Pain Type Chronic pain   Pain Onset More than a month ago   Pain Frequency Constant                         [facility]  Adult PT Treatment/Exercise - 06/30/16 0001      Lumbar Exercises: Stretches   Single Knee to Chest Stretch 1 rep;20 seconds   Double Knee to Chest Stretch 1 rep;20 seconds     Lumbar Exercises: Machines for Strengthening   Leg Press 70# bil. LE's 3 sets 10 reps     Lumbar Exercises: Standing   Heel Raises 10 reps   Other Standing Lumbar Exercises Unilateral heel raise 10 reps with UE support - RLE and LLE     Knee/Hip Exercises: Aerobic   Recumbent Bike SciFit Level 4.0 x 5 minutes with UE's and LE's     Knee/Hip Exercises: Standing   Forward Step Up Both;1 set;10 reps;Hand Hold: 2;Step Height: 6"   Step Down Both;1 set;10 reps;Hand Hold: 2;Step Height: 6"     Knee/Hip Exercises: Supine   Bridges with Clamshell AROM;Both;1 set;10 reps     Knee/Hip Exercises: Sidelying   Hip ABduction Strengthening;Right;Left;1 set;10 reps  2# on LLE:  3# on RLE 10 reps each leg   Clams RLE and LLE - 3# weight used 10 reps each     Knee/Hip Exercises: Prone   Hamstring Curl 1 set;10 reps  2# weight   Hip Extension AROM;Both;1 set;10 reps  knee flexed at 90 degrees - min assist for complete  ROM   Straight Leg Raises --     TherEx:  Quadruped position - low back stretch by sitting back towards heels with UE's flexed in front with elbows extended - 20 sec hold Tall kneeling - partial squats - sitting back on heels - return to upright position x 10 reps with no UE support  Heel slides - knee to chest with extension - 3# weight used on each leg - RLE and LLE 10 reps each  SLR - RLE and LLE 3# 10 reps each   Pt performed bridging with hip abduction/adduction x 10 reps each; bridging with marching x 10 reps each; bridging with LE extension x 10 reps each  Pt performed standing hip abduction, extension, and flexion with green theraband x 10 reps each leg      PT Short Term Goals - 06/26/16 1414      PT SHORT TERM GOAL #1   Title Pt will be IND in HEP to improve balance, strength,  endurance, and flexibility. TARGET DATE FOR ALL STGS: 06/27/16   Status Achieved     PT SHORT TERM GOAL #2   Title Pt will improve TUG score to </= 13.5 secs for reduced fall risk.     Baseline 13.5 on 3/22-MET   Status Achieved     PT SHORT TERM GOAL #3   Title Pt will amb. 100' without device without occurrence of L toes catching for incr. safety with household mobility.    Baseline 200' over tile and carpet without SPC and no L toe catching   Status Achieved     PT SHORT TERM GOAL #4   Title Pt will improve gait speed with LRAD to >/=2.43f/sec. to safely amb. in the community.    Baseline 2.18 ft/sec on 06/26/16-PARTIALLY MET   Status Partially Met           PT Long Term Goals - 06/30/16 1606      PT LONG TERM GOAL #1   Title Pt will improve BERG score to >/=56/56 to decr. falls risk. TARGET DATE FOR ALL LTGS: 07/25/16   Baseline 55/56 on 3/22   Status On-going     PT LONG TERM GOAL #2   Title Perform DGI/FGA if BERG score met early and write goal as indicated.    Status New     PT LONG TERM GOAL #3   Title Perform 6 MWT and write goal as indicated.   Status Achieved     PT LONG TERM GOAL #4   Title Pt will amb. 500' over even/uneven terrain with LRAD, at MOD I level, to improve functional mobility.   Baseline met per pt report -- 12-27-15   Status New     PT LONG TERM GOAL #5   Title Pt will improve walking distance during 6 MWT by 200 feet while performing gait at moderate intensity and mild-moderate DOE.   Baseline 683 feet   Status New               Plan - 06/30/16 1602    Clinical Impression Statement LLE remains weaker than RLE with increased extensor tone in Lt quads, impacting Lt knee AROM with difficulty flexing left knee.  Pt also has weakness in Lt hip flexors and circumducts Lt leg but is able to minimize compensatory techniques with verbal cues.  Pt reported fatigue in legs at end of session.   Rehab Potential Good   Clinical Impairments  Affecting Rehab Potential see above  PT Frequency 2x / week   PT Duration 8 weeks   PT Treatment/Interventions ADLs/Self Care Home Management;Biofeedback;Electrical Stimulation;Neuromuscular re-education;Balance training;Therapeutic exercise;Therapeutic activities;Functional mobility training;Stair training;Gait training;Orthotic Fit/Training;DME Instruction;Patient/family education;Vestibular   PT Next Visit Plan Cont with stretches, LE strengthening and balance training   Consulted and Agree with Plan of Care Patient      Patient will benefit from skilled therapeutic intervention in order to improve the following deficits and impairments:  Abnormal gait, Decreased endurance, Decreased knowledge of use of DME, Decreased strength, Decreased balance, Decreased mobility, Impaired flexibility, Decreased coordination, Pain, Impaired tone  Visit Diagnosis: Other abnormalities of gait and mobility  Muscle weakness (generalized)     Problem List Patient Active Problem List   Diagnosis Date Noted  . Reactive depression 05/19/2016  . 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 Orthostatic hypotension 11/30/2014  . C6 spinal cord injury ([place]) 11/20/2014  . Neurogenic bowel 11/20/2014  . Neurogenic bladder 11/20/2014  . Fever of unknown origin   . Spastic tetraplegia ([place]) 11/14/2014  . C5 vertebral fracture ([place]) 11/09/2014    Jozee Hammer, [place], PT 06/30/2016, [facility] [address] [place], [place], [address] Phone: [phone]   Fax:  [phone]  Name: Carl [name] MRN: [id number] Date of Birth: Jul 29, 1950

## 2016-07-03 ENCOUNTER — Encounter: Payer: Self-pay | Admitting: Physical Therapy

## 2016-07-03 ENCOUNTER — Ambulatory Visit: Payer: Medicare Other | Admitting: Physical Therapy

## 2016-07-03 DIAGNOSIS — R29818 Other symptoms and signs involving the nervous system: Secondary | ICD-10-CM

## 2016-07-03 DIAGNOSIS — R2689 Other abnormalities of gait and mobility: Secondary | ICD-10-CM | POA: Diagnosis not present

## 2016-07-03 DIAGNOSIS — M6281 Muscle weakness (generalized): Secondary | ICD-10-CM

## 2016-07-03 NOTE — Therapy (Signed)
[facility] [address] [place], [place], [address] Phone: [phone]   Fax:  [phone]  Physical Therapy Treatment  Patient Details  Name: Carl [name] MRN: [id number] Date of Birth: 23-Mar-1951 Referring Provider: Dr. Naaman [place]  Encounter Date: 07/03/2016      PT End of Session - 07/03/16 1406    Visit Number 7   Number of Visits 17   Date for PT Re-Evaluation 07/29/16   Authorization Type G-CODE AND PROGRESS NOTE EVERY 10TH VISIT. Medicare primary and BCBS federal secondary.   PT Start Time 1317   PT Stop Time 1404   PT Time Calculation (min) 47 min   Activity Tolerance Patient tolerated treatment well   Behavior During Therapy WFL for tasks assessed/performed      Past Medical History:  Diagnosis Date  . Bradycardia   . Chronic abdominal pain   . Hesitancy of micturition     Past Surgical History:  Procedure Laterality Date  . ANTERIOR CERVICAL DECOMP/DISCECTOMY FUSION N/A 11/14/2014   Procedure: Cervical Five-Cervical Six  Anterior Cervical Decompression/Diskectomy/Fusion;  Surgeon: Erline Levine, MD;  Location: [facility];  Service: Neurosurgery;  Laterality: N/A;    There were no vitals filed for this visit.      Subjective Assessment - 07/03/16 1319    Subjective Pt reports soreness after session on Monday; soreness lasted x 2 days   Pertinent History Hx of C5-C6 SCI (spastic tetraplegia) incomplete, orthostatic hypotension, neurogenic bowel   Patient Stated Goals Gain strength in legs   Pain Score 3    Pain Location Back   Pain Orientation Lower   Pain Descriptors / Indicators Tightness;Spasm   Pain Type Chronic pain             [facility] Adult PT Treatment/Exercise - 07/03/16 1321      Lumbar Exercises: Quadruped   Single Arm Raise Right;Left;Other (comment)  2 reps each   Straight Leg Raise Other (comment)   Straight Leg Raises Limitations R and LLE, 2 reps each     Knee/Hip  Exercises: Stretches   Quad Stretch Left;1 rep;30 seconds  LE off side of mat   Hip Flexor Stretch Left;1 rep;60 seconds   Hip Flexor Stretch Limitations LE off side of mat   Knee: Self-Stretch to increase Flexion Right;Left;1 rep;60 seconds   Other Knee/Hip Stretches Prone > child's pose x 3 reps for shoulder, hip and knee stretch     Knee/Hip Exercises: Standing   Lunge Walking - Round Trips 20' with min-mod A   Other Standing Knee Exercises Also performed high knee marching x 20' and retro gait x 20' with min A to continue to facilitate hamstring activation and balance during single limb stance     Knee/Hip Exercises: Supine   Bridges Limitations Straight leg bridges for HS activation with feet on peanut ball; bilat x 5, single LE x 5 with contralateral LE hip flexion, 5 reps single leg with contralateral LE hip/knee flexion; 5 reps bridge with HS curl pulling ball in   Other Supine Knee/Hip Exercises terminal hip extension bridges on LLE with LLE Off side of mat propped on step x 10 reps with minimal HS activation noted           PT Education - 07/03/16 1405    Education provided Yes   Education Details further visits and POC   Person(s) Educated Patient   Methods Explanation   Comprehension Verbalized understanding  PT Short Term Goals - 06/26/16 1414      PT SHORT TERM GOAL #1   Title Pt will be IND in HEP to improve balance, strength, endurance, and flexibility. TARGET DATE FOR ALL STGS: 06/27/16   Status Achieved     PT SHORT TERM GOAL #2   Title Pt will improve TUG score to </= 13.5 secs for reduced fall risk.     Baseline 13.5 on 3/22-MET   Status Achieved     PT SHORT TERM GOAL #3   Title Pt will amb. 100' without device without occurrence of L toes catching for incr. safety with household mobility.    Baseline 200' over tile and carpet without SPC and no L toe catching   Status Achieved     PT SHORT TERM GOAL #4   Title Pt will improve gait speed  with LRAD to >/=2.41f/sec. to safely amb. in the community.    Baseline 2.18 ft/sec on 06/26/16-PARTIALLY MET   Status Partially Met           PT Long Term Goals - 07/03/16 1411      PT LONG TERM GOAL #1   Title Pt will improve BERG score to >/=56/56 to decr. falls risk. TARGET DATE FOR ALL LTGS: 07/25/16   Baseline 55/56 on 3/22   Status On-going     PT LONG TERM GOAL #2   Title Perform DGI/FGA if BERG score met early and write goal as indicated.    Status On-going     PT LONG TERM GOAL #3   Title Perform 6 MWT and write goal as indicated.   Baseline Met 06/09/2016 and goal written below   Status Achieved     PT LONG TERM GOAL #4   Title Pt will amb. 500' over even/uneven terrain with LRAD, at MOD I level, to improve functional mobility.   Status On-going     PT LONG TERM GOAL #5   Title Pt will improve walking distance during 6 MWT by 200 feet while performing gait at moderate intensity and mild-moderate DOE.   Status On-going               Plan - 07/03/16 1406    Clinical Impression Statement Treatment session with focus on ROM, strengthening and activation of bilat LE focusing more intensely on LLE beginning in supine transitioning to quadruped for increased core/proximal stability training >> standing/gait for balance training.  Pt tolerated well.  Discussed adding visits once pt returns from vacation to continue to address impairments to reach targeted LTG.  Pt agreeable.     Clinical Impairments Affecting Rehab Potential see above   PT Treatment/Interventions ADLs/Self Care Home Management;Biofeedback;Electrical Stimulation;Neuromuscular re-education;Balance training;Therapeutic exercise;Therapeutic activities;Functional mobility training;Stair training;Gait training;Orthotic Fit/Training;DME Instruction;Patient/family education;Vestibular   PT Next Visit Plan Cont with stretches, LE strengthening and balance training   Consulted and Agree with Plan of Care Patient       Patient will benefit from skilled therapeutic intervention in order to improve the following deficits and impairments:  Abnormal gait, Decreased endurance, Decreased knowledge of use of DME, Decreased strength, Decreased balance, Decreased mobility, Impaired flexibility, Decreased coordination, Pain, Impaired tone  Visit Diagnosis: Other abnormalities of gait and mobility  Muscle weakness (generalized)  Other symptoms and signs involving the nervous system     Problem List Patient Active Problem List   Diagnosis Date Noted  . Reactive depression 05/19/2016  . 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 Orthostatic hypotension 11/30/2014  . C6 spinal cord  injury ([facility]) 11/20/2014  . Neurogenic bowel 11/20/2014  . Neurogenic bladder 11/20/2014  . Fever of unknown origin   . Spastic tetraplegia ([facility]) 11/14/2014  . C5 vertebral fracture ([place]) 11/09/2014   Raylene Everts, PT, DPT 07/03/16    2:12 PM    [facility] [address] [place], [place], [address] Phone: [phone]   Fax:  [phone]  Name: Carl [name] MRN: [id number] Date of Birth: 07-14-1950

## 2016-07-10 ENCOUNTER — Other Ambulatory Visit: Payer: Self-pay | Admitting: Physical Medicine & Rehabilitation

## 2016-07-10 DIAGNOSIS — S14106S Unspecified injury at C6 level of cervical spinal cord, sequela: Secondary | ICD-10-CM

## 2016-07-10 DIAGNOSIS — K592 Neurogenic bowel, not elsewhere classified: Secondary | ICD-10-CM

## 2016-07-10 DIAGNOSIS — N319 Neuromuscular dysfunction of bladder, unspecified: Secondary | ICD-10-CM

## 2016-07-10 DIAGNOSIS — G8250 Quadriplegia, unspecified: Secondary | ICD-10-CM

## 2016-07-11 MED ORDER — TRAMADOL HCL 50 MG PO TABS: 50.0000 mg | ORAL_TABLET | Freq: Four times a day (QID) | ORAL | 0 refills | Status: DC | PRN

## 2016-07-11 NOTE — Telephone Encounter (Signed)
error 

## 2016-07-14 ENCOUNTER — Ambulatory Visit: Payer: Medicare Other | Attending: Physical Medicine & Rehabilitation | Admitting: Rehabilitation

## 2016-07-14 ENCOUNTER — Encounter: Payer: Self-pay | Admitting: Rehabilitation

## 2016-07-14 DIAGNOSIS — R2689 Other abnormalities of gait and mobility: Secondary | ICD-10-CM | POA: Insufficient documentation

## 2016-07-14 DIAGNOSIS — M6281 Muscle weakness (generalized): Secondary | ICD-10-CM | POA: Insufficient documentation

## 2016-07-14 DIAGNOSIS — R29818 Other symptoms and signs involving the nervous system: Secondary | ICD-10-CM | POA: Diagnosis present

## 2016-07-14 NOTE — Therapy (Signed)
[facility] [address] [place], [place], [address] Phone: [phone]   Fax:  [phone]  Physical Therapy Treatment  Patient Details  Name: Frisco Cordts MRN: [id number] Date of Birth: July 10, 1950 Referring Provider: Dr. Naaman [place]  Encounter Date: 07/14/2016      PT End of Session - 07/14/16 1539    Visit Number 8   Number of Visits 17   Date for PT Re-Evaluation 07/29/16   Authorization Type G-CODE AND PROGRESS NOTE EVERY 10TH VISIT. Medicare primary and BCBS federal secondary.   PT Start Time 1533   PT Stop Time 1615   PT Time Calculation (min) 42 min   Activity Tolerance Patient tolerated treatment well   Behavior During Therapy WFL for tasks assessed/performed      Past Medical History:  Diagnosis Date  . Bradycardia   . Chronic abdominal pain   . Hesitancy of micturition     Past Surgical History:  Procedure Laterality Date  . ANTERIOR CERVICAL DECOMP/DISCECTOMY FUSION N/A 11/14/2014   Procedure: Cervical Five-Cervical Six  Anterior Cervical Decompression/Diskectomy/Fusion;  Surgeon: Erline Levine, MD;  Location: [facility];  Service: Neurosurgery;  Laterality: N/A;    There were no vitals filed for this visit.      Subjective Assessment - 07/14/16 1536    Subjective Pt reports doing ok, no changes, no falls.    Pertinent History Hx of C5-C6 SCI (spastic tetraplegia) incomplete, orthostatic hypotension, neurogenic bowel   Patient Stated Goals Gain strength in legs   Currently in Pain? Yes   Pain Score 3    Pain Location Back   Pain Orientation Lower   Pain Descriptors / Indicators Tightness;Spasm   Pain Type Chronic pain   Pain Onset More than a month ago   Pain Frequency Constant   Aggravating Factors  standing for long periods of time, sitting still for long periods of time   Pain Relieving Factors pain medications, lying down                         [facility] Adult PT  Treatment/Exercise - 07/14/16 1554      Ambulation/Gait   Ambulation/Gait Yes   Ambulation/Gait Assistance 5: Supervision;4: Min guard   Ambulation/Gait Assistance Details Worked on gait without AD with trial use of simulated toe cap.  PT felt that a more passive forward L step would perhaps decreased spasticity and tightness in LLE.  Did note improvement, however pt reluctant to pursue this at this time.  Also provided cues for posture and forward hip protraction.     Ambulation Distance (Feet) 230 Feet   Assistive device None   Gait Pattern Step-through pattern;Decreased stride length;Decreased hip/knee flexion - left;Left hip hike   Ambulation Surface Level;Indoor     Exercises   Exercises Other Exercises   Other Exercises  L hip flexor stretch with LLE off EOM x 2 reps of 2 mins.  Treadmill training for imroved endurance x 5 mins with speed up to 1.4 mph.  Feel that increased speed increased spasticity, therefore maintained slower speed throughout.  Pt tolerated well.  Educated on walking program, esp now that weather is improving.  Sit<>stand sitting on EOM (corner) with L LE off mat and RLE on small step to increase facilitation and strengthening in LLE during task.  2 sets of 10 reps without UE support with min to mod A to facilitate forward trunk flexion.  PT Education - 07/14/16 1539    Education provided Yes   Education Details walking program.    Person(s) Educated Patient   Methods Explanation   Comprehension Verbalized understanding          PT Short Term Goals - 06/26/16 1414      PT SHORT TERM GOAL #1   Title Pt will be IND in HEP to improve balance, strength, endurance, and flexibility. TARGET DATE FOR ALL STGS: 06/27/16   Status Achieved     PT SHORT TERM GOAL #2   Title Pt will improve TUG score to </= 13.5 secs for reduced fall risk.     Baseline 13.5 on 3/22-MET   Status Achieved     PT SHORT TERM GOAL #3   Title Pt will amb. 100'  without device without occurrence of L toes catching for incr. safety with household mobility.    Baseline 200' over tile and carpet without SPC and no L toe catching   Status Achieved     PT SHORT TERM GOAL #4   Title Pt will improve gait speed with LRAD to >/=2.87f/sec. to safely amb. in the community.    Baseline 2.18 ft/sec on 06/26/16-PARTIALLY MET   Status Partially Met           PT Long Term Goals - 07/03/16 1411      PT LONG TERM GOAL #1   Title Pt will improve BERG score to >/=56/56 to decr. falls risk. TARGET DATE FOR ALL LTGS: 07/25/16   Baseline 55/56 on 3/22   Status On-going     PT LONG TERM GOAL #2   Title Perform DGI/FGA if BERG score met early and write goal as indicated.    Status On-going     PT LONG TERM GOAL #3   Title Perform 6 MWT and write goal as indicated.   Baseline Met 06/09/2016 and goal written below   Status Achieved     PT LONG TERM GOAL #4   Title Pt will amb. 500' over even/uneven terrain with LRAD, at MOD I level, to improve functional mobility.   Status On-going     PT LONG TERM GOAL #5   Title Pt will improve walking distance during 6 MWT by 200 feet while performing gait at moderate intensity and mild-moderate DOE.   Status On-going               Plan - 07/14/16 1856    Clinical Impression Statement Skilled session focused on improving quality of gait to decrease spasticity, endurance with gait on treadmill, and improved LLE strengthening and flexibility.  tolerated well.     Clinical Impairments Affecting Rehab Potential see above   PT Treatment/Interventions ADLs/Self Care Home Management;Biofeedback;Electrical Stimulation;Neuromuscular re-education;Balance training;Therapeutic exercise;Therapeutic activities;Functional mobility training;Stair training;Gait training;Orthotic Fit/Training;DME Instruction;Patient/family education;Vestibular   PT Next Visit Plan Cont with stretches, LE strengthening and balance training   Consulted  and Agree with Plan of Care Patient      Patient will benefit from skilled therapeutic intervention in order to improve the following deficits and impairments:  Abnormal gait, Decreased endurance, Decreased knowledge of use of DME, Decreased strength, Decreased balance, Decreased mobility, Impaired flexibility, Decreased coordination, Pain, Impaired tone  Visit Diagnosis: Other abnormalities of gait and mobility  Muscle weakness (generalized)     Problem List Patient Active Problem List   Diagnosis Date Noted  . Reactive depression 05/19/2016  . 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 Orthostatic hypotension 11/30/2014  . C6 spinal cord injury (  [place]) 11/20/2014  . Neurogenic bowel 11/20/2014  . Neurogenic bladder 11/20/2014  . Fever of unknown origin   . Spastic tetraplegia ([place]) 11/14/2014  . C5 vertebral fracture ([place]) 11/09/2014    Cameron Sprang, PT, MPT [facility] [address] [place], [place], [address] Phone: [phone]   Fax:  [phone] 07/14/16, 6:58 PM  Name: Chasten Blaze MRN: [id number] Date of Birth: 1951-02-09

## 2016-07-17 ENCOUNTER — Encounter: Payer: Self-pay | Admitting: Physical Therapy

## 2016-07-17 ENCOUNTER — Ambulatory Visit: Payer: Medicare Other | Admitting: Physical Therapy

## 2016-07-17 DIAGNOSIS — M6281 Muscle weakness (generalized): Secondary | ICD-10-CM

## 2016-07-17 DIAGNOSIS — R2689 Other abnormalities of gait and mobility: Secondary | ICD-10-CM | POA: Diagnosis not present

## 2016-07-17 DIAGNOSIS — R29818 Other symptoms and signs involving the nervous system: Secondary | ICD-10-CM

## 2016-07-17 NOTE — Therapy (Signed)
[facility] [address] [place], [place], [address] Phone: [phone]   Fax:  [phone]  Physical Therapy Treatment  Patient Details  Name: Carl [name] MRN: [id number] Date of Birth: [birth year] Referring Provider: Dr. Naaman Plummer  Encounter Date: 07/17/2016      PT End of Session - 07/17/16 1414    Visit Number 9   Number of Visits 17   Date for PT Re-Evaluation 07/29/16   Authorization Type G-CODE AND PROGRESS NOTE EVERY 10TH VISIT. Medicare primary and BCBS federal secondary.   PT Start Time 1318   PT Stop Time 1403   PT Time Calculation (min) 45 min   Activity Tolerance Patient tolerated treatment well   Behavior During Therapy WFL for tasks assessed/performed      Past Medical History:  Diagnosis Date  . Bradycardia   . Chronic abdominal pain   . Hesitancy of micturition     Past Surgical History:  Procedure Laterality Date  . ANTERIOR CERVICAL DECOMP/DISCECTOMY FUSION N/A 11/14/2014   Procedure: Cervical Five-Cervical Six  Anterior Cervical Decompression/Diskectomy/Fusion;  Surgeon: Erline Levine, MD;  Location: [facility];  Service: Neurosurgery;  Laterality: N/A;    There were no vitals filed for this visit.      Subjective Assessment - 07/17/16 1336    Subjective No issues to report; helping with a car show this weekend at church   Pertinent History Hx of C5-C6 SCI (spastic tetraplegia) incomplete, orthostatic hypotension, neurogenic bowel   Patient Stated Goals Gain strength in legs   Currently in Pain? Yes   Pain Score 3    Pain Location Back   Pain Orientation Upper   Pain Descriptors / Indicators Tightness;Discomfort   Pain Type Chronic pain                         [facility] Adult PT Treatment/Exercise - 07/17/16 1339      Ambulation/Gait   Stairs Yes   Stairs Assistance 5: Supervision;4: Min guard   Stair Management Technique One rail Right;One rail Left;Alternating  pattern;Forwards   Number of Stairs 12   Height of Stairs 6     Neuro Re-ed    Neuro Re-ed Details  NMR during stair negotiation up/down 4 steps x 3 reps ascending with LLE to focus on activation of LLE hip and knee flexion from terminal hip extension with anterior pelvic rotation for increased step length and foot clearance.  Pt able to perform with supervision-min A.     Lumbar Exercises: Stretches   Active Hamstring Stretch 2 reps;60 seconds   Active Hamstring Stretch Limitations R and L seated with heel propped first on bed and then transitioned to propped on stool in front and therapist providing stretch to low back to bring pelvis into anterior rotation (with sheet behind back) to increase stretch on proximal hamstring   Quadruped Mid Back Stretch 3 reps;10 seconds   Quadruped Mid Back Stretch Limitations child's pose in middle and hands/pelvis shifted to L and R for more lateral trunk stretch     Lumbar Exercises: Aerobic   Tread Mill 1.9 mph x 5 minutes with manual facilitation at L pelvis and LLE for rotation and cues for hip and knee flexion for full foot clearance and step length.  Proximal hamstring spasticity noted to be limiting hip flexion on LLE-pt cued to take full step on RLE and heel strike on R allowing full stance time and hip extension on LLE allowing  improved activation and ROM of hip flexors and step length LLE; little to no carryover seen during over ground gait.                    PT Short Term Goals - 06/26/16 1414      PT SHORT TERM GOAL #1   Title Pt will be IND in HEP to improve balance, strength, endurance, and flexibility. TARGET DATE FOR ALL STGS: 06/27/16   Status Achieved     PT SHORT TERM GOAL #2   Title Pt will improve TUG score to </= 13.5 secs for reduced fall risk.     Baseline 13.5 on 3/22-MET   Status Achieved     PT SHORT TERM GOAL #3   Title Pt will amb. 100' without device without occurrence of L toes catching for incr. safety with  household mobility.    Baseline 200' over tile and carpet without SPC and no L toe catching   Status Achieved     PT SHORT TERM GOAL #4   Title Pt will improve gait speed with LRAD to >/=2.33f/sec. to safely amb. in the community.    Baseline 2.18 ft/sec on 06/26/16-PARTIALLY MET   Status Partially Met           PT Long Term Goals - 07/03/16 1411      PT LONG TERM GOAL #1   Title Pt will improve BERG score to >/=56/56 to decr. falls risk. TARGET DATE FOR ALL LTGS: 07/25/16   Baseline 55/56 on 3/22   Status On-going     PT LONG TERM GOAL #2   Title Perform DGI/FGA if BERG score met early and write goal as indicated.    Status On-going     PT LONG TERM GOAL #3   Title Perform 6 MWT and write goal as indicated.   Baseline Met 06/09/2016 and goal written below   Status Achieved     PT LONG TERM GOAL #4   Title Pt will amb. 500' over even/uneven terrain with LRAD, at MOD I level, to improve functional mobility.   Status On-going     PT LONG TERM GOAL #5   Title Pt will improve walking distance during 6 MWT by 200 feet while performing gait at moderate intensity and mild-moderate DOE.   Status On-going               Plan - 07/17/16 1414    Clinical Impression Statement Skilled session wtih continued focus on improving ROM in hamstrings and pelvis to allow greater freedom of movement for gait; performed NMR and LLE activation training on stairs and gait training on treadmill; during stair negotiation and treadmill training pt demonstrated improved gait mechanics but unable to maintain on overground gait.     Rehab Potential Good   Clinical Impairments Affecting Rehab Potential see above   PT Treatment/Interventions ADLs/Self Care Home Management;Biofeedback;Electrical Stimulation;Neuromuscular re-education;Balance training;Therapeutic exercise;Therapeutic activities;Functional mobility training;Stair training;Gait training;Orthotic Fit/Training;DME Instruction;Patient/family  education;Vestibular   PT Next Visit Plan 10th visit G code and progress note due!!!  Two more visits scheduled-re-eval on 4/20  Tone management of L proximal hamstring (over powers hip flexion)-decreased tone noted if LLE brought into full hip extension first and cued to rotate pelvis forwards to initiate hip flex/knee flex.  Gait, balance, terminal hamstring activation, stretching hamstrings, gastroc, low back with anterior pelvic tilt (use sheet behind back to stretch).   Consulted and Agree with Plan of Care Patient  Patient will benefit from skilled therapeutic intervention in order to improve the following deficits and impairments:  Abnormal gait, Decreased endurance, Decreased knowledge of use of DME, Decreased strength, Decreased balance, Decreased mobility, Impaired flexibility, Decreased coordination, Pain, Impaired tone  Visit Diagnosis: Other abnormalities of gait and mobility  Muscle weakness (generalized)  Other symptoms and signs involving the nervous system     Problem List Patient Active Problem List   Diagnosis Date Noted  . Reactive depression 05/19/2016  . 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 Orthostatic hypotension 11/30/2014  . C6 spinal cord injury ([place]) 11/20/2014  . Neurogenic bowel 11/20/2014  . Neurogenic bladder 11/20/2014  . Fever of unknown origin   . Spastic tetraplegia ([place]) 11/14/2014  . C5 vertebral fracture ([place]) 11/09/2014   Raylene Everts, PT, DPT 07/17/16    2:22 PM    [facility] [address] [place], [place], [address] Phone: [phone]   Fax:  [phone]  Name: Abdon Petrosky MRN: [id number] Date of Birth: [birth year]

## 2016-07-21 ENCOUNTER — Encounter: Payer: Medicare Other | Attending: Physical Medicine & Rehabilitation | Admitting: Physical Medicine & Rehabilitation

## 2016-07-21 ENCOUNTER — Ambulatory Visit: Payer: Medicare Other | Admitting: Rehabilitation

## 2016-07-21 ENCOUNTER — Encounter: Payer: Self-pay | Admitting: Physical Medicine & Rehabilitation

## 2016-07-21 VITALS — BP 108/69 | HR 45

## 2016-07-21 DIAGNOSIS — R3911 Hesitancy of micturition: Secondary | ICD-10-CM | POA: Diagnosis not present

## 2016-07-21 DIAGNOSIS — N319 Neuromuscular dysfunction of bladder, unspecified: Secondary | ICD-10-CM | POA: Diagnosis not present

## 2016-07-21 DIAGNOSIS — G8250 Quadriplegia, unspecified: Secondary | ICD-10-CM | POA: Insufficient documentation

## 2016-07-21 DIAGNOSIS — R109 Unspecified abdominal pain: Secondary | ICD-10-CM | POA: Diagnosis not present

## 2016-07-21 DIAGNOSIS — M792 Neuralgia and neuritis, unspecified: Secondary | ICD-10-CM | POA: Diagnosis not present

## 2016-07-21 DIAGNOSIS — S14106S Unspecified injury at C6 level of cervical spinal cord, sequela: Secondary | ICD-10-CM | POA: Diagnosis present

## 2016-07-21 DIAGNOSIS — K592 Neurogenic bowel, not elsewhere classified: Secondary | ICD-10-CM | POA: Insufficient documentation

## 2016-07-21 DIAGNOSIS — Z7901 Long term (current) use of anticoagulants: Secondary | ICD-10-CM | POA: Diagnosis not present

## 2016-07-21 DIAGNOSIS — G8929 Other chronic pain: Secondary | ICD-10-CM | POA: Diagnosis not present

## 2016-07-21 DIAGNOSIS — R001 Bradycardia, unspecified: Secondary | ICD-10-CM | POA: Diagnosis not present

## 2016-07-21 DIAGNOSIS — S12400A Unspecified displaced fracture of fifth cervical vertebra, initial encounter for closed fracture: Secondary | ICD-10-CM | POA: Insufficient documentation

## 2016-07-21 MED ORDER — DULOXETINE HCL 30 MG PO CPEP: 30.0000 mg | ORAL_CAPSULE | Freq: Every day | ORAL | 2 refills | Status: DC

## 2016-07-21 NOTE — Progress Notes (Signed)
Subjective:    Patient ID: Carl [name], male    DOB: 07-May-1950, 66 y.o.   MRN: [id number]  HPI  [facility] is here in follow up of his cervical myelopathy. At last visit I sent him for more therapy to address his gait/balance. He finishes this week. He feels that the therapy has helped with balance. He still has some limitations and uses a cane for longer distance gait. He does some work around the house and with his church but is "relaxing" a lot. He is looking forward to the warmer weather so that he can go outside.   I added cymbalta  last visit and he has not noticed a lot of difference. He hasn't had any problems with it either. He does have an occasional tremor. He feels that his left hand is still a little tight as well.    Pain Inventory Average Pain 4 Pain Right Now 3 My pain is sharp, stabbing and tingling  In the last 24 hours, has pain interfered with the following? General activity 6 Relation with others 0 Enjoyment of life 6 What TIME of day is your pain at its worst? evening Sleep (in general) Good  Pain is worse with: walking and standing Pain improves with: rest and medication Relief from Meds: 4  Mobility use a cane  Function employed # of hrs/week . I need assistance with the following:  household duties  Neuro/Psych bladder control problems bowel control problems weakness numbness tingling trouble walking spasms  Prior Studies Any changes since last visit?  no  Physicians involved in your care Any changes since last visit?  no   Family History  Problem Relation Age of Onset  . Heart attack Father   . Alzheimer's disease Mother   . Aneurysm Brother     brain   Social History   Social History  . Marital status: Married    Spouse name: N/A  . Number of children: N/A  . Years of education: N/A   Occupational History  . Pastor    Social History Main Topics  . Smoking status: Former Games developer  . Smokeless tobacco: Never Used  .  Alcohol use Not on file  . Drug use: Unknown  . Sexual activity: Not on file   Other Topics Concern  . Not on file   Social History Narrative  . No narrative on file   Past Surgical History:  Procedure Laterality Date  . ANTERIOR CERVICAL DECOMP/DISCECTOMY FUSION N/A 11/14/2014   Procedure: Cervical Five-Cervical Six  Anterior Cervical Decompression/Diskectomy/Fusion;  Surgeon: Maeola Harman, MD;  Location: [facility];  Service: Neurosurgery;  Laterality: N/A;   Past Medical History:  Diagnosis Date  . Bradycardia   . Chronic abdominal pain   . Hesitancy of micturition    There were no vitals taken for this visit.  Opioid Risk Score:   Fall Risk Score:  `1  Depression screen PHQ 2/9  Depression screen [facility] 2/9 12/24/2015 01/09/2015  Decreased Interest 0 0  Down, Depressed, Hopeless 0 0  PHQ - 2 Score 0 0  Altered sleeping - 0  Tired, decreased energy - 1  Change in appetite - 1  Feeling bad or failure about yourself  - 0  Trouble concentrating - 0  Moving slowly or fidgety/restless - 0  Suicidal thoughts - 0  PHQ-9 Score - 2    Review of Systems  Constitutional: Positive for unexpected weight change.  HENT: Negative.   Eyes: Negative.   Respiratory: Negative.  Cardiovascular: Negative.   Gastrointestinal: Positive for abdominal pain.  Endocrine: Negative.   Genitourinary: Negative.   Musculoskeletal: Negative.   Skin: Negative.   Allergic/Immunologic: Negative.   Neurological: Negative.   Hematological: Negative.   Psychiatric/Behavioral: Negative.   All other systems reviewed and are negative.      Objective:   Physical Exam  Constitutional: He is oriented to person, place, and time. He appears well-developed and well-nourished.  HENT:  Head: Normocephalic and atraumatic.  Eyes: Conjunctivae are normal. Pupils are equal, round, and reactive to light.  Neck:    Cardiovascular: brady. regular.  Respiratory: normal effort.  GI: Soft. Bowel  sounds +.  Genitourinary: foley out Musculoskeletal: mild left shoulder pain with IR/ER  Neurological: He is alert and oriented to person, place, and time. No cranial nerve deficit. Coordination normal.  Patient has normal sensation proximal to C6 dermatome. Reduced PP/LT from C6 through sacral levels Patient has decreased tone in bilateral Lower extremities 4/5 bilateral deltoids, biceps, 4-triceps, 4 wrist extensors, 4/5 right HI, 4/5 left HI, right HF, KE and ADF/APF   4-/5. 3 to 4-/5 left hip flexors, 4/5 knee extensors 3+/4- ankle dorsiflexors and plantar flexors, left remains weaker than right. No resting tone LUE. Occasional cog wheeling of LUE and RUE. No resting tremor  Gait stable with cane---better leg swing .  Skin: Skin is warm and dry.   Psych: pleasant as always, flat   Assessment/Plan:   1. Functional deficits secondary to C6 ASIA C incomplete tetraplegia, spinal cord injury due to fall with C5-C6 fracture and neurogenic bowel and bladder  - continue with outpt therapies to completion--->HEP   2. For spasms: continue baclofen ( ) at bedtime.  3. Pain Management: gabapentin continue at  TID - tramadol q6 prn---continue as needed -increase cymbalta for pain and mood to  daily 4. Mood: definitely playing a role. cymbalta as above    -asked him to engage in the community more  -?volunteer  5. Neurogenic bladder---I/O caths-  -target volumes remain300-500cc   6. Neurogenic bowel: senna in pm and fleet enema qam. -sorbitol, miralax for rescue                  15 minutes of face to face patient care time were spent during this visit. All questions were encouraged and answered.

## 2016-07-21 NOTE — Patient Instructions (Signed)
  Continue to be aggressive with home program of exercises after formal therapy ends!!!

## 2016-07-24 ENCOUNTER — Ambulatory Visit: Payer: Medicare Other | Admitting: Physical Therapy

## 2016-07-24 DIAGNOSIS — R2689 Other abnormalities of gait and mobility: Secondary | ICD-10-CM

## 2016-07-24 DIAGNOSIS — M6281 Muscle weakness (generalized): Secondary | ICD-10-CM

## 2016-07-25 NOTE — Therapy (Signed)
[facility] [address] [place], [place], [address] Phone: [phone]   Fax:  [phone]  Physical Therapy Treatment  Patient Details  Name: Carl [name] MRN: [id number] Date of Birth: 11/16/1950 Referring Provider: Dr. Naaman Plummer  Encounter Date: 07/24/2016      PT End of Session - 07/25/16 1101    Visit Number 10   Number of Visits 17   Date for PT Re-Evaluation 07/29/16   Authorization Type G-CODE AND PROGRESS NOTE EVERY 10TH VISIT. Medicare primary and BCBS federal secondary.   PT Start Time 1421   PT Stop Time 1509   PT Time Calculation (min) 48 min      Past Medical History:  Diagnosis Date  . Bradycardia   . Chronic abdominal pain   . Hesitancy of micturition     Past Surgical History:  Procedure Laterality Date  . ANTERIOR CERVICAL DECOMP/DISCECTOMY FUSION N/A 11/14/2014   Procedure: Cervical Five-Cervical Six  Anterior Cervical Decompression/Diskectomy/Fusion;  Surgeon: Erline Levine, MD;  Location: [facility];  Service: Neurosurgery;  Laterality: N/A;    There were no vitals filed for this visit.      Subjective Assessment - 07/25/16 1055    Subjective Pt reports he is ready for discharge - states he is going to start going to [employer]   Pertinent History Hx of C5-C6 SCI (spastic tetraplegia) incomplete, orthostatic hypotension, neurogenic bowel   Patient Stated Goals Gain strength in legs   Currently in Pain? No/denies            [facility] PT Assessment - 07/25/16 0001      Berg Balance Test   Sit to Stand Able to stand without using hands and stabilize independently   Standing Unsupported Able to stand safely 2 minutes   Sitting with Back Unsupported but Feet Supported on Floor or Stool Able to sit safely and securely 2 minutes   Stand to Sit Sits safely with minimal use of hands   Transfers Able to transfer safely, minor use of hands   Standing Unsupported with Eyes Closed Able to  stand 10 seconds safely   Standing Ubsupported with Feet Together Able to place feet together independently and stand 1 minute safely   From Standing, Reach Forward with Outstretched Arm Can reach confidently >25 cm (10")   From Standing Position, Pick up Object from Floor Able to pick up shoe safely and easily   From Standing Position, Turn to Look Behind Over each Shoulder Looks behind from both sides and weight shifts well   Turn 360 Degrees Able to turn 360 degrees safely in 4 seconds or less  3.78 to Rt; 3.97 secs to Lt   Standing Unsupported, Alternately Place Feet on Step/Stool Able to stand independently and safely and complete 8 steps in 20 seconds   Standing Unsupported, One Foot in Front Able to place foot tandem independently and hold 30 seconds   Standing on One Leg Able to lift leg independently and hold > 10 seconds   Total Score 56                     [facility] Adult PT Treatment/Exercise - 07/25/16 0001      Ambulation/Gait   Ambulation/Gait Yes   Ambulation/Gait Assistance 5: Supervision   Ambulation Distance (Feet) 819 Feet   Assistive device Straight cane   Gait Pattern Step-through pattern;Decreased stride length;Decreased hip/knee flexion - left;Left hip hike   Ambulation Surface Level;Indoor  Standardized Balance Assessment   Standardized Balance Assessment Timed Up and Go Test     Timed Up and Go Test   TUG Normal TUG   Normal TUG (seconds) 11.1     Treadmill 5" at 1.3 mph with bil. UE support   Elliptical 1" forward, 1" backward at level 1.5  Leg press 70# 3 sets 10 reps bil. LE's           PT Short Term Goals - July 29, 2016 1450      PT SHORT TERM GOAL #2   Title Pt will improve TUG score to </= 13.5 secs for reduced fall risk.     Baseline 11.10 secs without cane on 29-Jul-2016   Status Achieved           PT Long Term Goals - 07/25/16 1057      PT LONG TERM GOAL #1   Title Pt will improve BERG score to >/=56/56 to decr. falls  risk. TARGET DATE FOR ALL LTGS: 07/25/16   Baseline 56/56 on Jul 29, 2016   Status Achieved     PT LONG TERM GOAL #3   Title Perform 6 MWT and write goal as indicated.   Status Achieved     PT LONG TERM GOAL #4   Title Pt will amb. 500' over even/uneven terrain with LRAD, at MOD I level, to improve functional mobility.   Status Achieved     PT LONG TERM GOAL #5   Title Pt will improve walking distance during 6 MWT by 200 feet while performing gait at moderate intensity and mild-moderate DOE.   Baseline 819'= 136' further than on initial 6MWT   Status Not Met               Plan - 07/25/16 1103    Clinical Impression Statement Pt has met all LTG's except for increasing distance in 6MWT by 200' - pt increased by 136';  pt feels that he is ready for D/C - plans to continue at [facility] Potential Good   Clinical Impairments Affecting Rehab Potential see above   PT Frequency 2x / week   PT Duration 8 weeks   PT Treatment/Interventions ADLs/Self Care Home Management;Biofeedback;Electrical Stimulation;Neuromuscular re-education;Balance training;Therapeutic exercise;Therapeutic activities;Functional mobility training;Stair training;Gait training;Orthotic Fit/Training;DME Instruction;Patient/family education;Vestibular   PT Next Visit Plan N/A -- D/C   Consulted and Agree with Plan of Care Patient      Patient will benefit from skilled therapeutic intervention in order to improve the following deficits and impairments:  Abnormal gait, Decreased endurance, Decreased knowledge of use of DME, Decreased strength, Decreased balance, Decreased mobility, Impaired flexibility, Decreased coordination, Pain, Impaired tone  Visit Diagnosis: Other abnormalities of gait and mobility  Muscle weakness (generalized)       G-Codes - 2016-07-29 1105    Functional Assessment Tool Used (Outpatient Only) Berg score 56/56:  TUG score 11.10 secs:  6MWT 819'   Functional Limitation Mobility:  Walking and moving around   Mobility: Walking and Moving Around Goal Status [phone]) At least 1 percent but less than 20 percent impaired, limited or restricted   Mobility: Walking and Moving Around Discharge Status [phone]) At least 20 percent but less than 40 percent impaired, limited or restricted      Problem List Patient Active Problem List   Diagnosis Date Noted  . Reactive depression 05/19/2016  . 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 Orthostatic hypotension 11/30/2014  . C6 spinal cord injury ([place]) 11/20/2014  . Neurogenic bowel 11/20/2014  .  Neurogenic bladder 11/20/2014  . Fever of unknown origin   . Spastic tetraplegia ([place]) 11/14/2014  . C5 vertebral fracture ([place]) 11/09/2014   PHYSICAL THERAPY DISCHARGE SUMMARY  Visits from Start of Care: 10  Current functional level related to goals / functional outcomes: See above for progress towards LTG's   Remaining deficits: Continued weakness and spasticity in LLE - extensor tone produces gait deviations Continued dependency with gait with pt using SPC for assistance with community amb.   Education / Equipment: Pt has been instructed in a HEP for stretching and strengthening; he states he plans on joining [employer] for continued  exercise upon D/C from PT  Plan: Patient agrees to discharge.  Patient goals were partially met. Patient is being discharged due to meeting the stated rehab goals.  ?????       Alda Lea, PT 07/25/2016, 11:08 AM  [facility] [address] [place], [place], [address] Phone: [phone]   Fax:  [phone]  Name: Carl [name] MRN: [id number] Date of Birth: 03/03/1951

## 2016-08-23 ENCOUNTER — Other Ambulatory Visit: Payer: Self-pay | Admitting: Physical Medicine & Rehabilitation

## 2016-08-23 DIAGNOSIS — F329 Major depressive disorder, single episode, unspecified: Secondary | ICD-10-CM

## 2016-08-25 ENCOUNTER — Telehealth: Payer: Self-pay | Admitting: *Deleted

## 2016-08-25 DIAGNOSIS — F329 Major depressive disorder, single episode, unspecified: Secondary | ICD-10-CM

## 2016-08-25 MED ORDER — DULOXETINE HCL 30 MG PO CPEP: 30.0000 mg | ORAL_CAPSULE | Freq: Every day | ORAL | 0 refills | Status: DC

## 2016-08-25 NOTE — Telephone Encounter (Signed)
Mrs Nagy called and says that Carl [name] need 20 mg duloxetine as he cannot tolerate the 30 mg dose.  Please advise.

## 2016-08-26 MED ORDER — DULOXETINE HCL 20 MG PO CPEP: 20.0000 mg | ORAL_CAPSULE | Freq: Every day | ORAL | 2 refills | Status: DC

## 2016-08-26 NOTE — Telephone Encounter (Signed)
Called patient and spoke with wife, informed her of medication change and orders.

## 2016-08-26 NOTE — Telephone Encounter (Signed)
rx changed and sent to pharmacy

## 2016-10-01 ENCOUNTER — Other Ambulatory Visit: Payer: Self-pay | Admitting: Physical Medicine & Rehabilitation

## 2016-10-01 DIAGNOSIS — G8250 Quadriplegia, unspecified: Secondary | ICD-10-CM

## 2016-10-01 DIAGNOSIS — N319 Neuromuscular dysfunction of bladder, unspecified: Secondary | ICD-10-CM

## 2016-10-01 DIAGNOSIS — S14106S Unspecified injury at C6 level of cervical spinal cord, sequela: Secondary | ICD-10-CM

## 2016-10-01 DIAGNOSIS — K592 Neurogenic bowel, not elsewhere classified: Secondary | ICD-10-CM

## 2016-11-10 IMAGING — CR DG ELBOW COMPLETE 3+V*R*
4 series · 4 of 4 positions shown · non-contrast
Comparison: None.

CLINICAL DATA: Fall with posterior elbow laceration and pain.

EXAM:
RIGHT ELBOW - COMPLETE 3+ VIEW

[AP (1 of 3)]
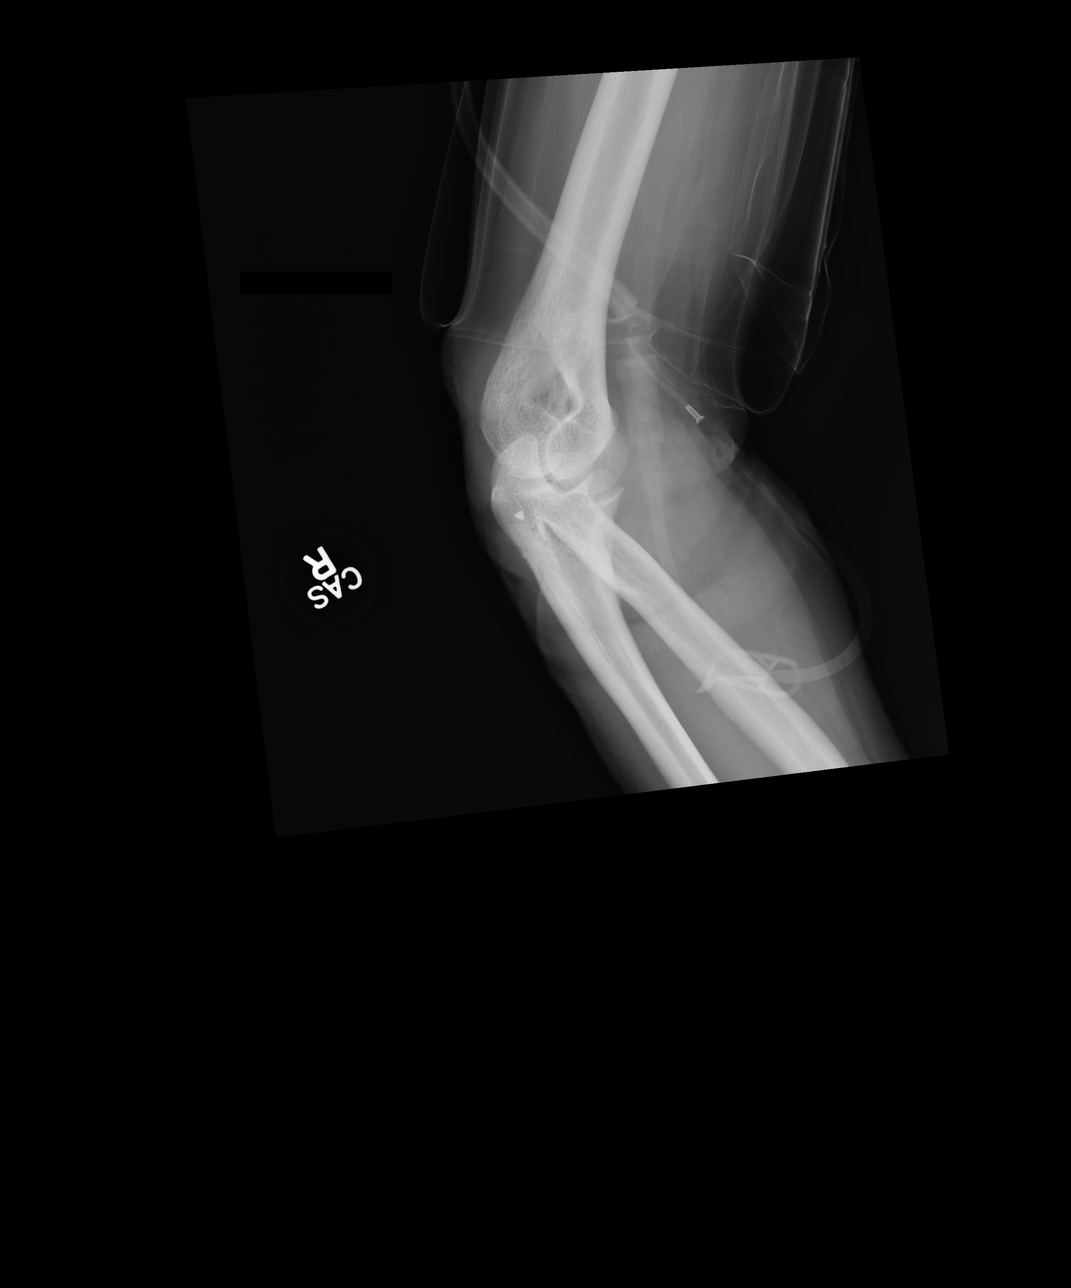

[AP (2 of 3)]
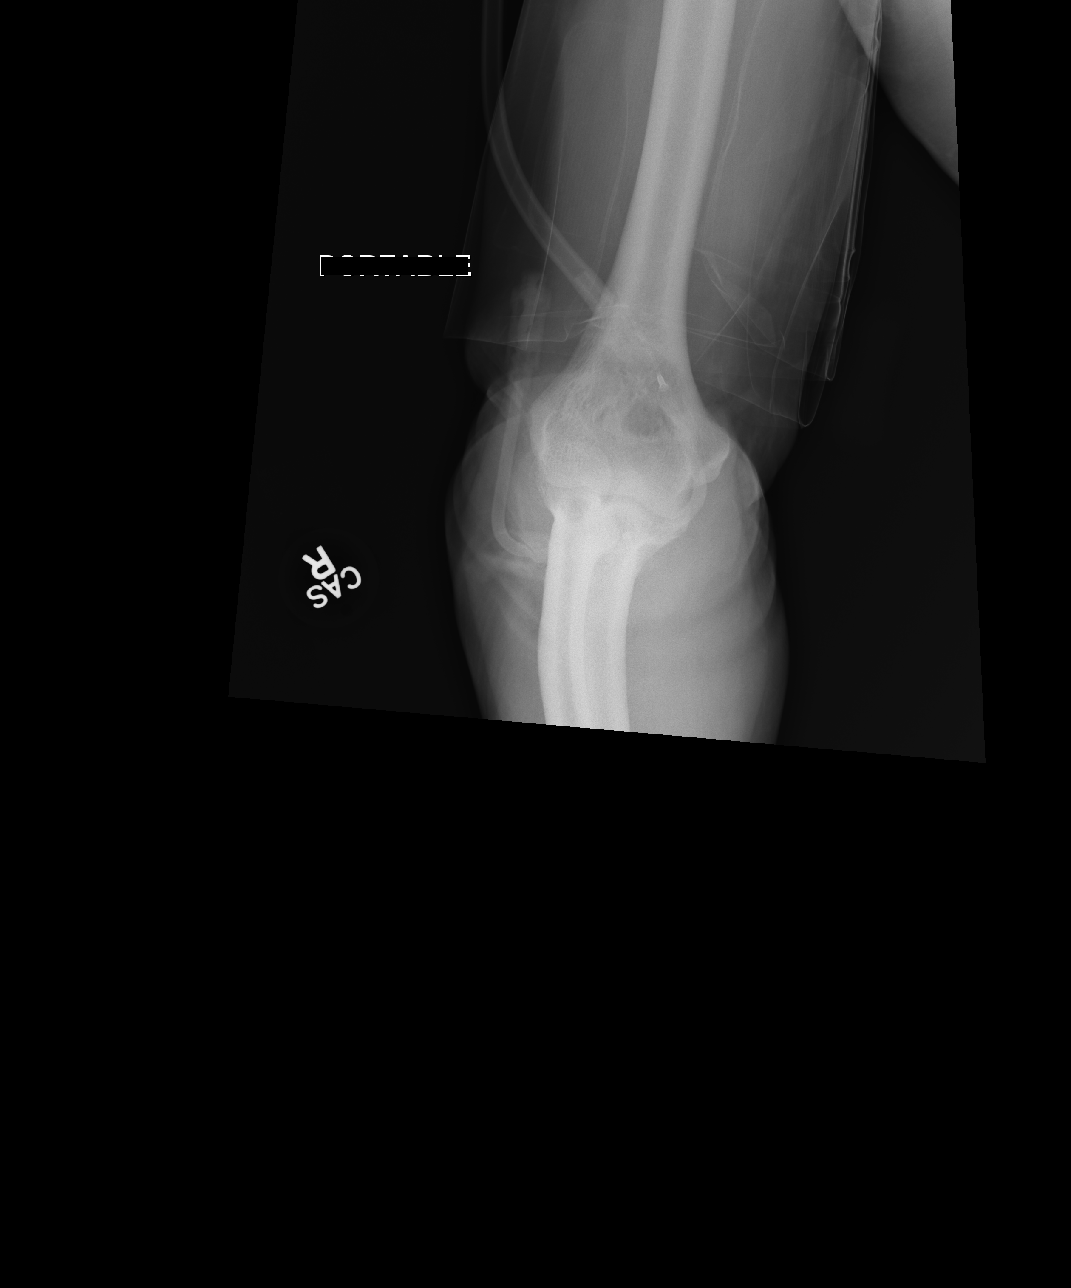

[AP (3 of 3)]
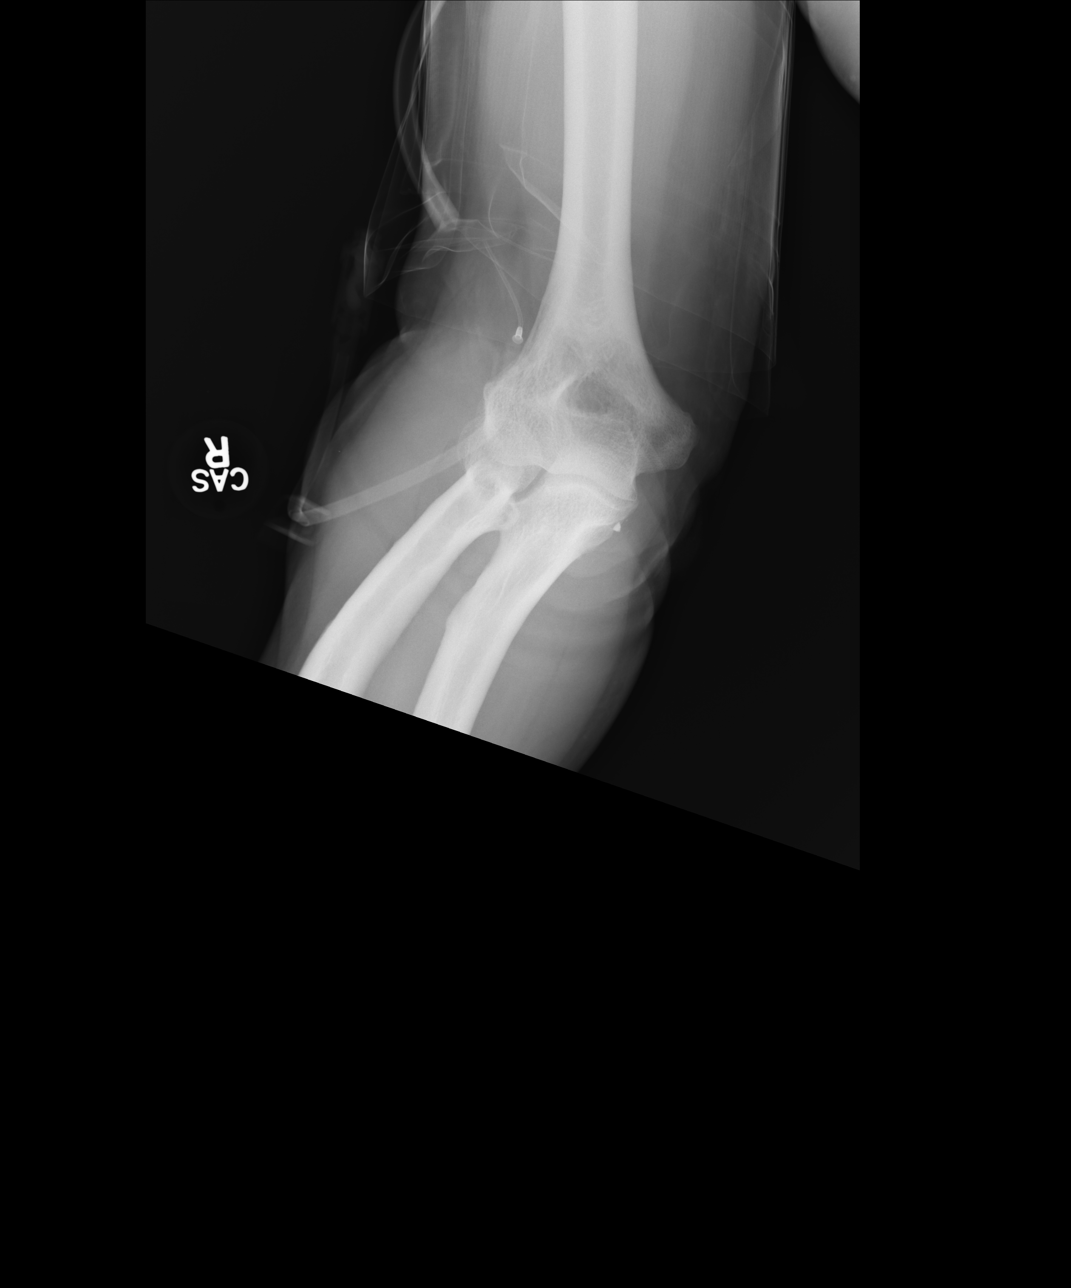

[lateral]
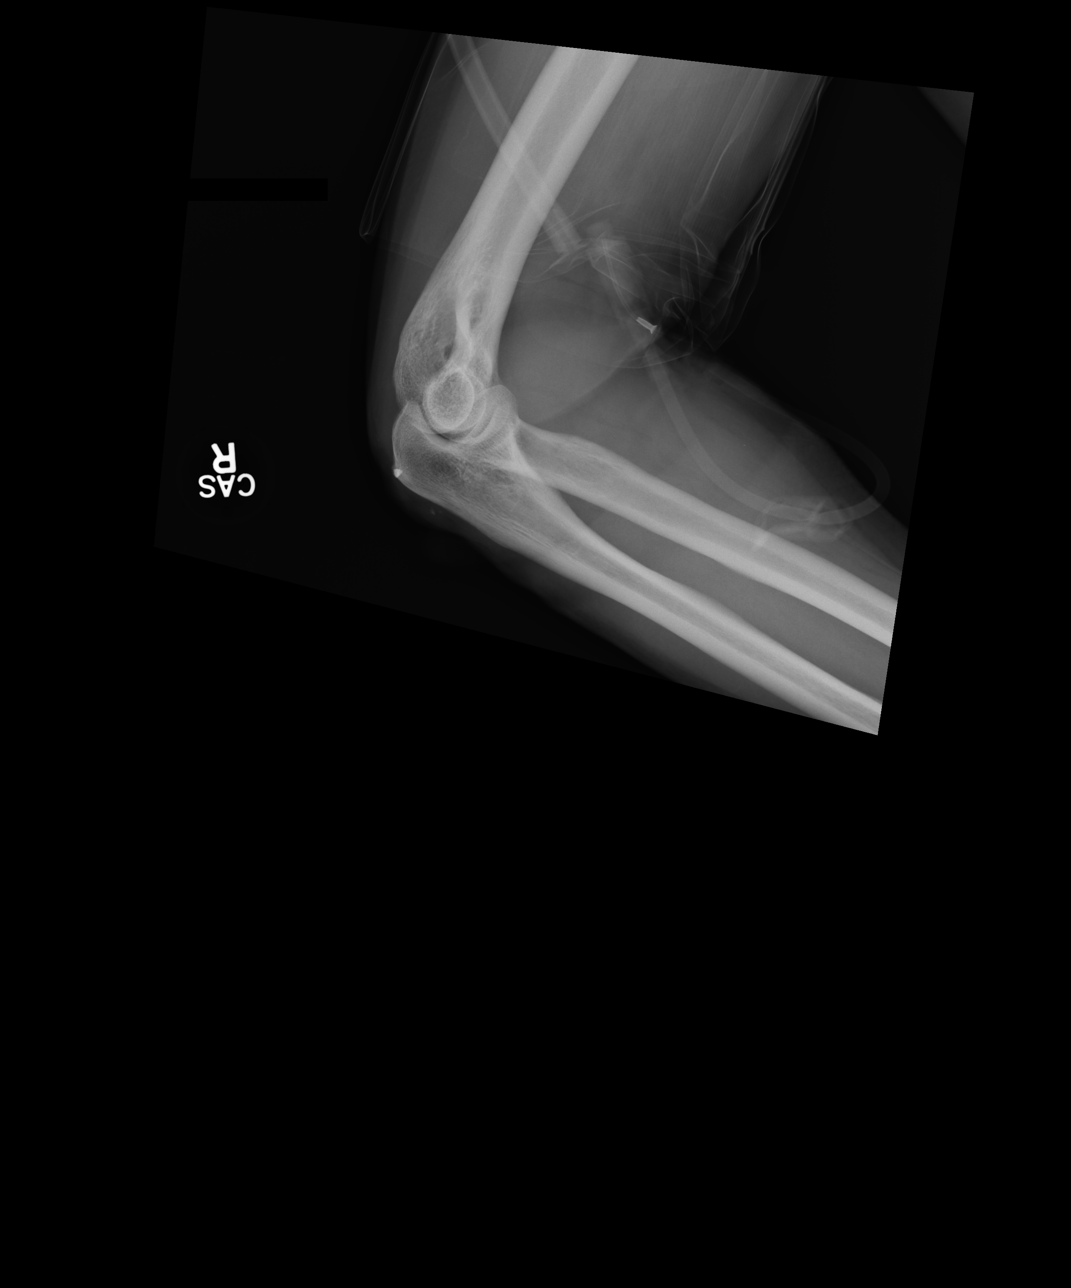

[4 of 4 positions shown; findings below may reference images not displayed]

FINDINGS: Soft tissue swelling around the olecranon with faint foreign body
measuring 2 mm. There is also a metallic appearing foreign body at
the olecranon medially measuring 3 mm. No evidence of fracture,
dislocation, or joint effusion.

Note that positioning is nonstandard due to patient condition, and
this best obtainable exam is decreasing sensitivity.
IMPRESSION: 1. Two foreign bodies about the olecranon, up to 3 mm.
2. No evidence of fracture.

## 2016-11-10 IMAGING — CR DG CHEST 1V PORT
1 series · 1 of 1 positions shown · non-contrast
Comparison: None.

CLINICAL DATA: Fall

EXAM:
PORTABLE CHEST - 1 VIEW

[AP]
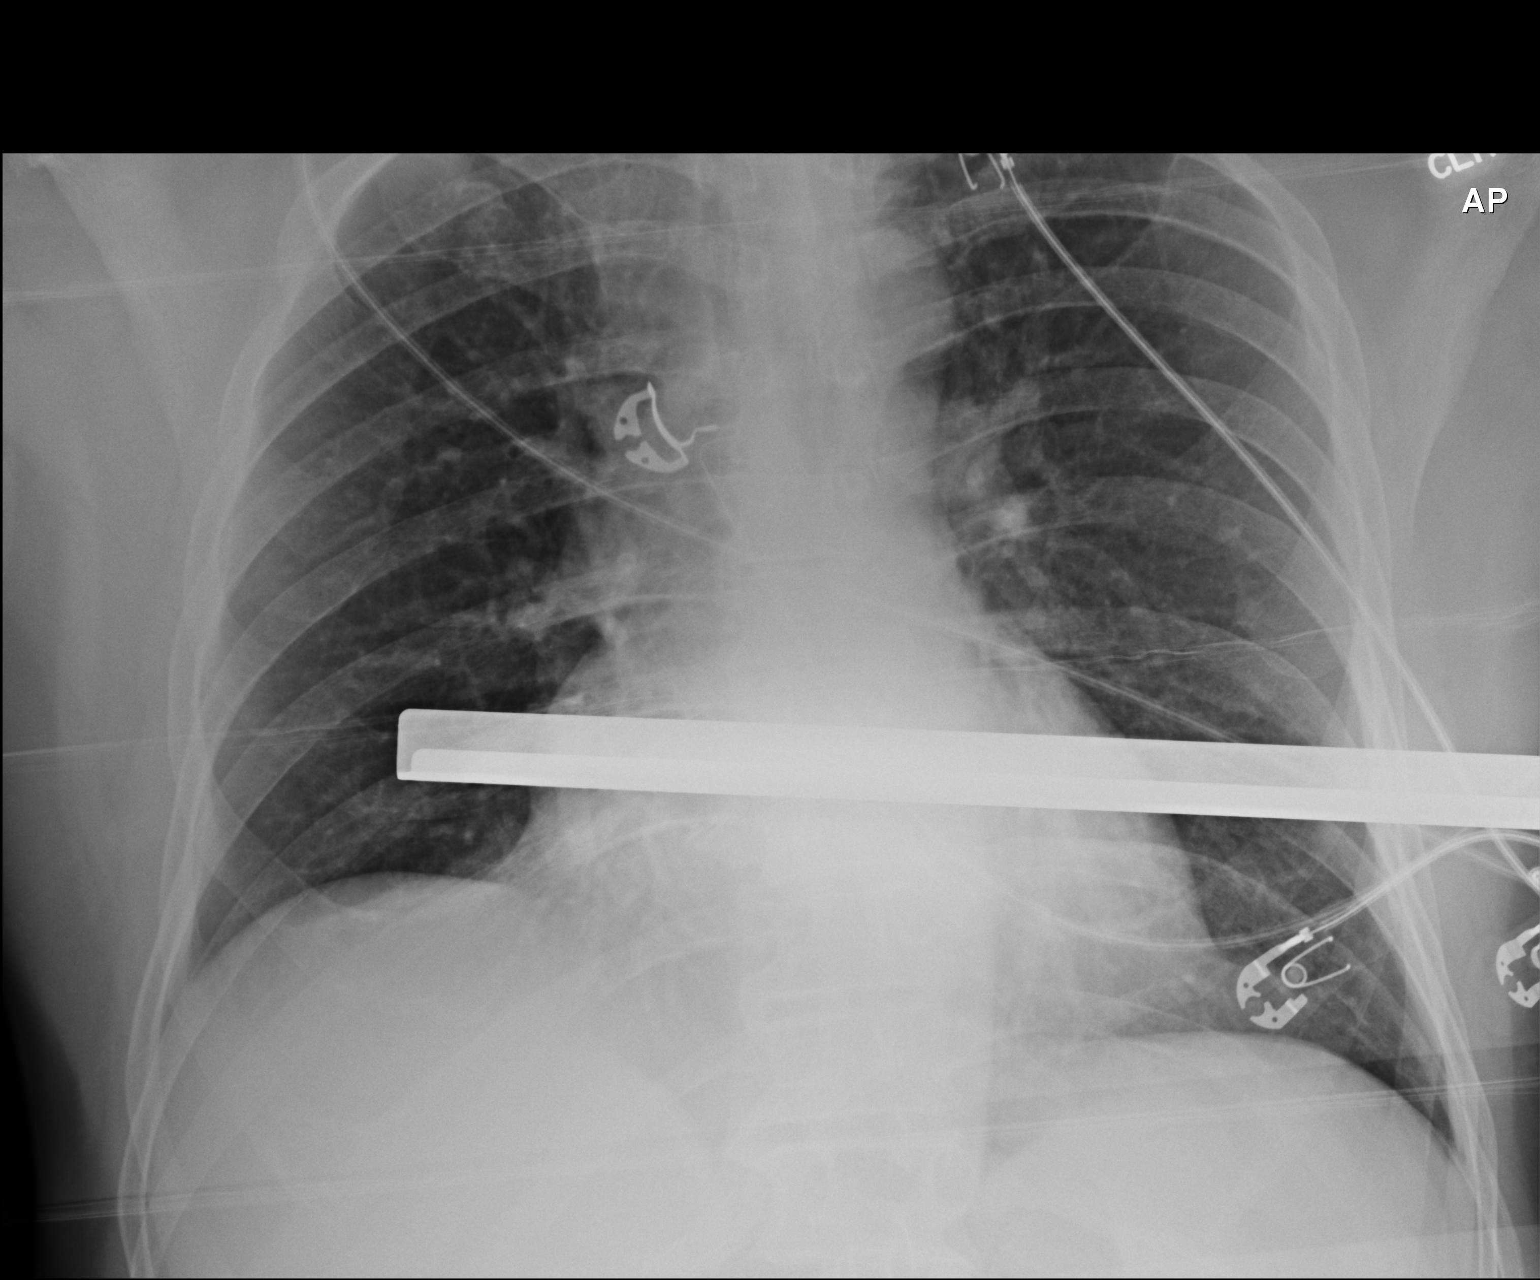

[1 of 1 positions shown; findings below may reference images not displayed]

FINDINGS: Heart size at upper limits of normal. Lungs are grossly clear.
Support apparatus artifact is present over the mid chest. No gross
evidence for pneumothorax. No displaced fracture identified.
IMPRESSION: No gross evidence for acute cardiopulmonary process. Borderline
cardiomegaly may in part be related to portable supine AP technique.
Chest CT will be subsequently performed and dictated separately.

## 2016-11-24 ENCOUNTER — Ambulatory Visit: Payer: Medicare Other | Admitting: Physical Medicine & Rehabilitation

## 2016-11-24 ENCOUNTER — Other Ambulatory Visit: Payer: Self-pay | Admitting: Physical Medicine & Rehabilitation

## 2016-11-24 DIAGNOSIS — F329 Major depressive disorder, single episode, unspecified: Secondary | ICD-10-CM

## 2016-12-01 ENCOUNTER — Encounter: Payer: Medicare Other | Attending: Physical Medicine & Rehabilitation | Admitting: Physical Medicine & Rehabilitation

## 2016-12-01 ENCOUNTER — Encounter: Payer: Self-pay | Admitting: Physical Medicine & Rehabilitation

## 2016-12-01 VITALS — BP 101/59 | HR 42 | Resp 14

## 2016-12-01 DIAGNOSIS — N319 Neuromuscular dysfunction of bladder, unspecified: Secondary | ICD-10-CM

## 2016-12-01 DIAGNOSIS — M792 Neuralgia and neuritis, unspecified: Secondary | ICD-10-CM

## 2016-12-01 DIAGNOSIS — G8250 Quadriplegia, unspecified: Secondary | ICD-10-CM | POA: Diagnosis not present

## 2016-12-01 DIAGNOSIS — Z9889 Other specified postprocedural states: Secondary | ICD-10-CM | POA: Diagnosis not present

## 2016-12-01 DIAGNOSIS — K592 Neurogenic bowel, not elsewhere classified: Secondary | ICD-10-CM | POA: Diagnosis not present

## 2016-12-01 DIAGNOSIS — X58XXXS Exposure to other specified factors, sequela: Secondary | ICD-10-CM | POA: Insufficient documentation

## 2016-12-01 DIAGNOSIS — S14106S Unspecified injury at C6 level of cervical spinal cord, sequela: Secondary | ICD-10-CM | POA: Diagnosis not present

## 2016-12-01 DIAGNOSIS — Z87891 Personal history of nicotine dependence: Secondary | ICD-10-CM | POA: Diagnosis not present

## 2016-12-01 MED ORDER — GABAPENTIN 300 MG PO CAPS: 300.0000 mg | ORAL_CAPSULE | Freq: Three times a day (TID) | ORAL | 2 refills | Status: DC

## 2016-12-01 NOTE — Progress Notes (Signed)
Subjective:    Patient ID: Carl [name], male    DOB: 1950/07/10, 66 y.o.   MRN: [id number]  HPI   Carl [name] is here in follow up of his cervical spinal cord injury. 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, Catering manager. He misses the church but he couldn't keep up with the demands of the job.   He notes that his voice is more hoarse over the last year or so. He also notes some problems with swallowing solids as well.  He thinks some of his medications may be contributing. He is taking gabapentin, tramadol, and cymbalta currently.   Caths have been a little more difficult lately with the cath sometimes difficult to pass   Pain Inventory Average Pain 4 Pain Right Now 4 My pain is sharp, stabbing and tingling  In the last 24 hours, has pain interfered with the following? General activity 4 Relation with others 0 Enjoyment of life 4 What TIME of day is your pain at its worst? morning, evening Sleep (in general) Good  Pain is worse with: inactivity, standing and some activites Pain improves with: rest and therapy/exercise Relief from Meds: no selection  Mobility walk with assistance use a cane use a walker how many minutes can you walk? 30 ability to climb steps?  yes do you drive?  yes Do you have any goals in this area?  yes  Function retired Do you have any goals in this area?  yes  Neuro/Psych bladder control problems weakness numbness tremor tingling trouble walking spasms  Prior Studies Any changes since last visit?  no  Physicians involved in your care Any changes since last visit?  no   Family History  Problem Relation Age of Onset  . Heart attack Father   . Alzheimer's disease Mother   . Aneurysm Brother        brain   Social History   Social History  . Marital status: Married    Spouse name: N/A  . Number of children: N/A  . Years of education: N/A   Occupational History  . Pastor    Social History Main  Topics  . Smoking status: Former Games developer  . Smokeless tobacco: Never Used  . Alcohol use None  . Drug use: Unknown  . Sexual activity: Not Asked   Other Topics Concern  . None   Social History Narrative  . None   Past Surgical History:  Procedure Laterality Date  . ANTERIOR CERVICAL DECOMP/DISCECTOMY FUSION N/A 11/14/2014   Procedure: Cervical Five-Cervical Six  Anterior Cervical Decompression/Diskectomy/Fusion;  Surgeon: Carl Harman, MD;  Location: [facility];  Service: Neurosurgery;  Laterality: N/A;   Past Medical History:  Diagnosis Date  . Bradycardia   . Chronic abdominal pain   . Hesitancy of micturition    BP (!) 101/59 (BP Location: Right Arm, Patient Position: Sitting, Cuff Size: Normal)   Pulse (!) 42   Resp 14   SpO2 95%   Opioid Risk Score:   Fall Risk Score:  `1  Depression screen PHQ 2/9  Depression screen [facility] 2/9 12/24/2015 01/09/2015  Decreased Interest 0 0  Down, Depressed, Hopeless 0 0  PHQ - 2 Score 0 0  Altered sleeping - 0  Tired, decreased energy - 1  Change in appetite - 1  Feeling bad or failure about yourself  - 0  Trouble concentrating - 0  Moving slowly or fidgety/restless - 0  Suicidal thoughts - 0  PHQ-9 Score - 2    Review of Systems  Constitutional: Positive for appetite change and unexpected weight change.  HENT: Negative.   Eyes: Negative.   Respiratory: Positive for shortness of breath.   Cardiovascular: Negative.   Gastrointestinal: Positive for abdominal pain.  Endocrine: Negative.   Genitourinary: Negative.   Musculoskeletal: Positive for arthralgias, back pain, gait problem and myalgias.       Spasms  Skin: Negative.   Allergic/Immunologic: Negative.   Neurological: Positive for tremors, weakness and numbness.       Tingling  Hematological: Negative.   Psychiatric/Behavioral: Negative.        Objective:   Physical Exam  Constitutional: He is oriented to person, place, and time. He appears well-developed and  well-nourished.  HENT:  Head: Normocephalic and atraumatic.  Eyes: Conjunctivae are normal. Pupils are equal, round, and reactive to light.  Neck:    Cardiovascular: brady. regular.  Respiratory: normal effort.  GI: Soft. Bowel sounds +.  Genitourinary: foley out Musculoskeletal: mild left shoulder pain with IR/ER Neurological: He is alert and oriented to person, place, and time. No cranial nerve deficit. Coordination normal.  Patient has normal sensation above the C6 dermatome. Reduced PP/LT from C6 through sacral levels Patient has decreased tone in bilateral Lower extremities 4/5 bilateral deltoids, biceps, 4/5triceps, 4 wrist extensors, 4-/5 right HI, 4/5 left HI, right HF, KE and ADF/APF 4/5.   4/5 left hip flexors, 4/5 knee extensors  4- ankle dorsiflexors and plantar flexors, left remains weaker than right. No resting tone LUE. Occasional cog wheeling of LUE and RUE. No resting tremor  Gait stablewith cane---better leg swing.  Skin: Skin is warm and dry.   Psych: pleasant as always, remains flat   Assessment/Plan:   1. Functional deficits secondary to C6 ASIA C incomplete tetraplegia, spinal cord injury due to fall with C5-C6 fracture and neurogenic bowel and bladder  - continue with HEP. His weight lifting is making a difference   2. For spasms: continue baclofen (5mg ) at bedtime.  3. Pain Management: gabapentin continue at 300mg  TID--refilled today - tramadol q6 prn---continue as needed--no refill today 4. Mood: definitely playing a role although he denies  -continue cymbalta 5. Neurogenic bladder---I/O caths- ?coude tip cath #16----HE WILL LET us KNOW IF HE WANTS AN ORDER FOR THESE  -follow up with urology. -target volumes remain300-500cc   6. Neurogenic bowel: senna in pm and fleet enema qam. -sorbitol, miralax for rescue    15 minutes of face to face patient care time were spent during this  visit. All questions were encouraged and answered.

## 2016-12-01 NOTE — Patient Instructions (Signed)
?  ENT APPOINTMENT FOR YOUR VOICE IF YOU WOULD LIKE. JUST LET ME KNOW   BLADDER: COUDE TIP CATH?   -UROLOGY FOLLOW UP IF WORSENS   PLEASE FEEL FREE TO CALL OUR OFFICE WITH ANY PROBLEMS OR QUESTIONS [phone])

## 2016-12-10 ENCOUNTER — Other Ambulatory Visit: Payer: Self-pay | Admitting: Physical Medicine & Rehabilitation

## 2016-12-10 DIAGNOSIS — N319 Neuromuscular dysfunction of bladder, unspecified: Secondary | ICD-10-CM

## 2016-12-10 DIAGNOSIS — G8250 Quadriplegia, unspecified: Secondary | ICD-10-CM

## 2016-12-10 DIAGNOSIS — K592 Neurogenic bowel, not elsewhere classified: Secondary | ICD-10-CM

## 2016-12-10 DIAGNOSIS — S14106S Unspecified injury at C6 level of cervical spinal cord, sequela: Secondary | ICD-10-CM

## 2017-01-12 ENCOUNTER — Telehealth: Payer: Self-pay

## 2017-01-12 DIAGNOSIS — N319 Neuromuscular dysfunction of bladder, unspecified: Secondary | ICD-10-CM

## 2017-01-12 DIAGNOSIS — K592 Neurogenic bowel, not elsewhere classified: Secondary | ICD-10-CM

## 2017-01-12 DIAGNOSIS — G8250 Quadriplegia, unspecified: Secondary | ICD-10-CM

## 2017-01-12 DIAGNOSIS — S14106S Unspecified injury at C6 level of cervical spinal cord, sequela: Secondary | ICD-10-CM

## 2017-01-12 MED ORDER — TRAMADOL HCL 50 MG PO TABS: 50.0000 mg | ORAL_TABLET | Freq: Four times a day (QID) | ORAL | 0 refills | Status: DC | PRN

## 2017-01-12 NOTE — Telephone Encounter (Signed)
David at [facility] in [place] city [place] calling requesting a refill on Tramadol for patient.  Fax # [phone]
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
## 2017-02-10 ENCOUNTER — Other Ambulatory Visit: Payer: Self-pay | Admitting: Physical Medicine & Rehabilitation

## 2017-02-10 DIAGNOSIS — N319 Neuromuscular dysfunction of bladder, unspecified: Secondary | ICD-10-CM

## 2017-02-10 DIAGNOSIS — S14106S Unspecified injury at C6 level of cervical spinal cord, sequela: Secondary | ICD-10-CM

## 2017-02-10 DIAGNOSIS — K592 Neurogenic bowel, not elsewhere classified: Secondary | ICD-10-CM

## 2017-02-10 DIAGNOSIS — G8250 Quadriplegia, unspecified: Secondary | ICD-10-CM

## 2017-03-11 ENCOUNTER — Other Ambulatory Visit: Payer: Self-pay | Admitting: Physical Medicine & Rehabilitation

## 2017-03-11 DIAGNOSIS — K592 Neurogenic bowel, not elsewhere classified: Secondary | ICD-10-CM

## 2017-03-11 DIAGNOSIS — N319 Neuromuscular dysfunction of bladder, unspecified: Secondary | ICD-10-CM

## 2017-03-11 DIAGNOSIS — S14106S Unspecified injury at C6 level of cervical spinal cord, sequela: Secondary | ICD-10-CM

## 2017-03-11 DIAGNOSIS — G8250 Quadriplegia, unspecified: Secondary | ICD-10-CM

## 2017-03-23 ENCOUNTER — Encounter: Payer: Self-pay | Admitting: Physical Medicine & Rehabilitation

## 2017-03-23 ENCOUNTER — Encounter: Payer: Medicare Other | Attending: Physical Medicine & Rehabilitation | Admitting: Physical Medicine & Rehabilitation

## 2017-03-23 VITALS — BP 114/58 | HR 38

## 2017-03-23 DIAGNOSIS — R109 Unspecified abdominal pain: Secondary | ICD-10-CM | POA: Diagnosis not present

## 2017-03-23 DIAGNOSIS — M25512 Pain in left shoulder: Secondary | ICD-10-CM | POA: Diagnosis not present

## 2017-03-23 DIAGNOSIS — F329 Major depressive disorder, single episode, unspecified: Secondary | ICD-10-CM | POA: Diagnosis not present

## 2017-03-23 DIAGNOSIS — Z87891 Personal history of nicotine dependence: Secondary | ICD-10-CM | POA: Insufficient documentation

## 2017-03-23 DIAGNOSIS — N319 Neuromuscular dysfunction of bladder, unspecified: Secondary | ICD-10-CM | POA: Diagnosis not present

## 2017-03-23 DIAGNOSIS — N390 Urinary tract infection, site not specified: Secondary | ICD-10-CM | POA: Insufficient documentation

## 2017-03-23 DIAGNOSIS — S14106S Unspecified injury at C6 level of cervical spinal cord, sequela: Secondary | ICD-10-CM | POA: Diagnosis not present

## 2017-03-23 DIAGNOSIS — G8250 Quadriplegia, unspecified: Secondary | ICD-10-CM | POA: Diagnosis not present

## 2017-03-23 DIAGNOSIS — S12500A Unspecified displaced fracture of sixth cervical vertebra, initial encounter for closed fracture: Secondary | ICD-10-CM | POA: Insufficient documentation

## 2017-03-23 DIAGNOSIS — G959 Disease of spinal cord, unspecified: Secondary | ICD-10-CM | POA: Insufficient documentation

## 2017-03-23 DIAGNOSIS — W19XXXA Unspecified fall, initial encounter: Secondary | ICD-10-CM | POA: Diagnosis not present

## 2017-03-23 DIAGNOSIS — R490 Dysphonia: Secondary | ICD-10-CM | POA: Insufficient documentation

## 2017-03-23 DIAGNOSIS — R001 Bradycardia, unspecified: Secondary | ICD-10-CM | POA: Insufficient documentation

## 2017-03-23 DIAGNOSIS — G8929 Other chronic pain: Secondary | ICD-10-CM | POA: Insufficient documentation

## 2017-03-23 DIAGNOSIS — R1310 Dysphagia, unspecified: Secondary | ICD-10-CM | POA: Diagnosis not present

## 2017-03-23 DIAGNOSIS — G9589 Other specified diseases of spinal cord: Secondary | ICD-10-CM | POA: Diagnosis present

## 2017-03-23 DIAGNOSIS — K592 Neurogenic bowel, not elsewhere classified: Secondary | ICD-10-CM | POA: Diagnosis not present

## 2017-03-23 DIAGNOSIS — M545 Low back pain: Secondary | ICD-10-CM | POA: Diagnosis not present

## 2017-03-23 MED ORDER — TAPENTADOL HCL 50 MG PO TABS: 50.0000 mg | ORAL_TABLET | Freq: Four times a day (QID) | ORAL | 0 refills | Status: DC | PRN

## 2017-03-23 MED ORDER — GABAPENTIN 300 MG PO CAPS: 300.0000 mg | ORAL_CAPSULE | Freq: Three times a day (TID) | ORAL | 5 refills | Status: DC

## 2017-03-23 MED ORDER — DULOXETINE HCL 20 MG PO CPEP: ORAL_CAPSULE | ORAL | 5 refills | Status: DC

## 2017-03-23 NOTE — Progress Notes (Signed)
Subjective:    Patient ID: Carl [name], male    DOB: [birth year], 66 y.o.   MRN: [id number]  HPI   Carl [name] is here for follow-up of his cervical myelopathy.  From a balance and mobility standpoint he is about where he has been in the past.  He had questions today regarding his pain.  He does note more pain in his low back given a recent UTI he has been dealing with.  He also has ongoing pain in his arms and hands.  Tramadol does provide some relief but it is not "a perfect" solution.  He also feels that he is having more problems swallowing as well as hoarseness.  He is never had a loud voice but notices that solids are more difficult for him to swallow any cannot sing like he wants to church.  Pain Inventory Average Pain 4 Pain Right Now 5 My pain is .  In the last 24 hours, has pain interfered with the following? General activity 4 Relation with others 0 Enjoyment of life 5 What TIME of day is your pain at its worst? morning Sleep (in general) Fair  Pain is worse with: walking, bending, standing and some activites Pain improves with: rest Relief from Meds: 3  Mobility use a cane ability to climb steps?  yes do you drive?  yes  Function retired  Neuro/Psych weakness numbness tremor tingling trouble walking spasms  Prior Studies Any changes since last visit?  no  Physicians involved in your care Any changes since last visit?  no   Family History  Problem Relation Age of Onset  . Heart attack Father   . Alzheimer's disease Mother   . Aneurysm Brother        brain   Social History   Socioeconomic History  . Marital status: Married    Spouse name: None  . Number of children: None  . Years of education: None  . Highest education level: None  Social Needs  . Financial resource strain: None  . Food insecurity - worry: None  . Food insecurity - inability: None  . Transportation needs - medical: None  . Transportation needs - non-medical: None    Occupational History  . Occupation: Pastor  Tobacco Use  . Smoking status: Former Games developermoker  . Smokeless tobacco: Never Used  Substance and Sexual Activity  . Alcohol use: None  . Drug use: None  . Sexual activity: None  Other Topics Concern  . None  Social History Narrative  . None   Past Surgical History:  Procedure Laterality Date  . ANTERIOR CERVICAL DECOMP/DISCECTOMY FUSION N/A 11/14/2014   Procedure: Cervical Five-Cervical Six  Anterior Cervical Decompression/Diskectomy/Fusion;  Surgeon: Maeola HarmanJoseph Stern, MD;  Location: [facility];  Service: Neurosurgery;  Laterality: N/A;   Past Medical History:  Diagnosis Date  . Bradycardia   . Chronic abdominal pain   . Hesitancy of micturition    BP (!) 114/58   Pulse (!) 38 Comment: confirmed apicaly  SpO2 97% , HR is 40 apically  Opioid Risk Score:   Fall Risk Score:  `1  Depression screen PHQ 2/9  [facility] 12/24/2015 01/09/2015  Decreased Interest 0 0  Down, Depressed, Hopeless 0 0  PHQ - 2 Score 0 0  Altered sleeping - 0  Tired, decreased energy - 1  Change in appetite - 1  Feeling bad or failure about yourself  - 0  Trouble concentrating - 0  Moving slowly or fidgety/restless - 0  Suicidal thoughts - 0  PHQ-9 Score - 2     Review of Systems  Constitutional: Positive for appetite change, fatigue and unexpected weight change.  HENT: Negative.   Eyes: Negative.   Respiratory: Positive for shortness of breath.   Cardiovascular: Negative.   Gastrointestinal: Positive for abdominal pain.  Endocrine: Negative.   Genitourinary: Negative.   Musculoskeletal: Negative.   Skin: Negative.   Allergic/Immunologic: Negative.   Neurological: Negative.   Hematological: Negative.   Psychiatric/Behavioral: Negative.   All other systems reviewed and are negative.      Objective:   Physical Exam  Constitutional: He is oriented to person, place, and time. He appears well-developed and well-nourished.  HENT:  Voice is soft and a bit hoarse although not too different from prior examinations Head: Normocephalic and atraumatic.  Eyes: Conjunctivae are normal. Pupils are equal, round, and reactive to light.  Neck:    Cardiovascular: Heart rate exactly at 40.  Respiratory: Normal effort GI: Soft. Bowel sounds +.  Genitourinary: Not examined Musculoskeletal: mild left shoulder pain with IR/ER Neurological: He is alert and oriented to person, place, and time. No cranial nerve deficit. Coordination normal.  Patient has normal sensation above the C6 dermatome. Reduced PP/LT from C6 through sacral levels Patient has decreased tone in bilateral Lower extremities 4/5 bilateral deltoids, biceps, 4/5triceps, 4 wrist extensors, 4-/5 right HI, 4/5 left HI, right HF, KE and ADF/APF 4/5.   4/5 left hip flexors, 4/5 knee extensors  4- ankle dorsiflexors and plantar flexors, left remainsweaker than right. No resting tone LUE. Occasional cog wheeling of LUE and RUE. No resting tremor.  Motor and sensory exam unchanged Gait stablewith cane---better leg swing.  Skin: Skin is warm and dry.   Psych: pleasant as always, remains flat   Assessment/Plan:   1. Functional deficits secondary to C6 ASIA C incomplete tetraplegia, spinal cord injury due to fall with C5-C6 fracture and neurogenic bowel and bladder  - continue with HEP.   2. For spasms: continue baclofen (5mg ) at bedtime.  3. Pain Management: gabapentin continue at 300mg  TID--refilled today - tramadol q6 prn---continue for now during the day. -Begin trial of Nucynta 50 mg every 6 hours as needed.  May replace tramadol if effective and tolerated. 4. Mood: definitely playing a role although he denies             -continue cymbalta.  Refill this today 5. Neurogenic bladder---I/O caths- ?coude tip cath #16             -follow up with urology.    -recent UTI, being treated with cipro -target volumes remain300-500cc   6. Neurogenic  bowel: senna in pm and fleet enema qam. -sorbitol, miralax for rescue 7. Chronic bradycardia---baseline around 40.  Family physician aware and has been worked up extensively for his bradycardia in the past. 8.  Dysphagia: Primarily for solids.  Will arrange for modified barium swallow.  Depending upon the results we will further refer the patient.  He is also hoarse which he feels has worsened gradually too.     15 minutes of face to face patient care time were spent during this visit. All questions were encouraged and answered.

## 2017-03-23 NOTE — Patient Instructions (Signed)
PLEASE FEEL FREE TO CALL OUR OFFICE WITH ANY PROBLEMS OR QUESTIONS ([phone])  HAVE A HAPPY HOLIDAYS!                     ^                  ^^                ^ ^ ^             ^ ^ ^ ^ ^           ^ ^ ^ ^ ^ ^ ^        ^ ^ ^ ^ ^ ^ ^ ^ ^      ^ ^ ^ ^ ^ ^ ^ ^ ^ ^ ^                ^^^^                ^^^^                ^^^^     

## 2017-03-26 ENCOUNTER — Other Ambulatory Visit (HOSPITAL_COMMUNITY): Payer: Self-pay | Admitting: Physical Medicine & Rehabilitation

## 2017-03-26 DIAGNOSIS — R1310 Dysphagia, unspecified: Secondary | ICD-10-CM

## 2017-04-09 ENCOUNTER — Ambulatory Visit (HOSPITAL_COMMUNITY)
Admission: RE | Admit: 2017-04-09 | Discharge: 2017-04-09 | Disposition: A | Discharge status: Home / Self Care | Payer: Medicare Other | Source: Ambulatory Visit | Attending: Physical Medicine & Rehabilitation | Admitting: Physical Medicine & Rehabilitation

## 2017-04-09 DIAGNOSIS — R1310 Dysphagia, unspecified: Secondary | ICD-10-CM

## 2017-04-09 DIAGNOSIS — R1314 Dysphagia, pharyngoesophageal phase: Secondary | ICD-10-CM | POA: Insufficient documentation

## 2017-04-09 DIAGNOSIS — R490 Dysphonia: Secondary | ICD-10-CM | POA: Diagnosis not present

## 2017-05-08 ENCOUNTER — Other Ambulatory Visit: Payer: Self-pay | Admitting: Physical Medicine & Rehabilitation

## 2017-05-08 DIAGNOSIS — S14106S Unspecified injury at C6 level of cervical spinal cord, sequela: Secondary | ICD-10-CM

## 2017-05-08 DIAGNOSIS — G8250 Quadriplegia, unspecified: Secondary | ICD-10-CM

## 2017-05-08 DIAGNOSIS — N319 Neuromuscular dysfunction of bladder, unspecified: Secondary | ICD-10-CM

## 2017-05-08 DIAGNOSIS — K592 Neurogenic bowel, not elsewhere classified: Secondary | ICD-10-CM

## 2017-05-11 MED ORDER — TRAMADOL HCL 50 MG PO TABS
50.0000 mg | ORAL_TABLET | Freq: Four times a day (QID) | ORAL | 0 refills | Status: DC | PRN
Start: 2017-05-11 — End: 2017-05-20

## 2017-05-11 NOTE — Telephone Encounter (Signed)
I spoke with [place].  [place] [facility] [place]  He did not like the way the Nucynta made him feel and prefers to stay with the Tramadol. Refill called to pharmacy.

## 2017-05-12 ENCOUNTER — Other Ambulatory Visit: Payer: Self-pay | Admitting: Physical Medicine & Rehabilitation

## 2017-05-12 DIAGNOSIS — S14106S Unspecified injury at C6 level of cervical spinal cord, sequela: Secondary | ICD-10-CM

## 2017-05-12 DIAGNOSIS — G8250 Quadriplegia, unspecified: Secondary | ICD-10-CM

## 2017-05-12 DIAGNOSIS — K592 Neurogenic bowel, not elsewhere classified: Secondary | ICD-10-CM

## 2017-05-12 DIAGNOSIS — N319 Neuromuscular dysfunction of bladder, unspecified: Secondary | ICD-10-CM

## 2017-05-20 ENCOUNTER — Encounter: Payer: Self-pay | Admitting: Physical Medicine & Rehabilitation

## 2017-05-20 ENCOUNTER — Encounter: Payer: Medicare Other | Attending: Physical Medicine & Rehabilitation | Admitting: Physical Medicine & Rehabilitation

## 2017-05-20 VITALS — BP 114/73 | HR 51

## 2017-05-20 DIAGNOSIS — R490 Dysphonia: Secondary | ICD-10-CM | POA: Diagnosis not present

## 2017-05-20 DIAGNOSIS — G8250 Quadriplegia, unspecified: Secondary | ICD-10-CM | POA: Diagnosis not present

## 2017-05-20 DIAGNOSIS — K592 Neurogenic bowel, not elsewhere classified: Secondary | ICD-10-CM

## 2017-05-20 DIAGNOSIS — R1310 Dysphagia, unspecified: Secondary | ICD-10-CM | POA: Diagnosis not present

## 2017-05-20 DIAGNOSIS — M792 Neuralgia and neuritis, unspecified: Secondary | ICD-10-CM | POA: Diagnosis not present

## 2017-05-20 DIAGNOSIS — N319 Neuromuscular dysfunction of bladder, unspecified: Secondary | ICD-10-CM

## 2017-05-20 DIAGNOSIS — S14106S Unspecified injury at C6 level of cervical spinal cord, sequela: Secondary | ICD-10-CM | POA: Diagnosis not present

## 2017-05-20 DIAGNOSIS — G959 Disease of spinal cord, unspecified: Secondary | ICD-10-CM | POA: Diagnosis not present

## 2017-05-20 DIAGNOSIS — M25512 Pain in left shoulder: Secondary | ICD-10-CM | POA: Diagnosis not present

## 2017-05-20 DIAGNOSIS — R1312 Dysphagia, oropharyngeal phase: Secondary | ICD-10-CM

## 2017-05-20 DIAGNOSIS — Z87891 Personal history of nicotine dependence: Secondary | ICD-10-CM | POA: Diagnosis not present

## 2017-05-20 DIAGNOSIS — G9589 Other specified diseases of spinal cord: Secondary | ICD-10-CM | POA: Diagnosis present

## 2017-05-20 DIAGNOSIS — R001 Bradycardia, unspecified: Secondary | ICD-10-CM | POA: Insufficient documentation

## 2017-05-20 DIAGNOSIS — M545 Low back pain: Secondary | ICD-10-CM | POA: Diagnosis not present

## 2017-05-20 DIAGNOSIS — S12500A Unspecified displaced fracture of sixth cervical vertebra, initial encounter for closed fracture: Secondary | ICD-10-CM | POA: Diagnosis not present

## 2017-05-20 DIAGNOSIS — G8929 Other chronic pain: Secondary | ICD-10-CM | POA: Insufficient documentation

## 2017-05-20 DIAGNOSIS — N390 Urinary tract infection, site not specified: Secondary | ICD-10-CM | POA: Diagnosis not present

## 2017-05-20 DIAGNOSIS — R109 Unspecified abdominal pain: Secondary | ICD-10-CM | POA: Insufficient documentation

## 2017-05-20 MED ORDER — TRAMADOL HCL 50 MG PO TABS: 50.0000 mg | ORAL_TABLET | Freq: Four times a day (QID) | ORAL | 2 refills | Status: DC | PRN

## 2017-05-20 MED ORDER — NORTRIPTYLINE HCL 10 MG PO CAPS: 10.0000 mg | ORAL_CAPSULE | Freq: Every day | ORAL | 2 refills | Status: DC

## 2017-05-20 NOTE — Progress Notes (Signed)
Subjective:    Patient ID: Carl [name], male    DOB: 04-16-1950, 67 y.o.   MRN: [id number]  HPI   Carl [name] is here in followup of his tetraplegia and chronic pain disorder.  He did not tolerate the Nucynta as it made him feel very tired and "Drab".  He is back to the tramadol which he takes 3-4 times daily.  He went for his modified barium swallow at the beginning of last month which showed oral pharyngeal delay as well as decreased elevation of the larynx and delayed upper esophageal sphincter relaxation during swallow.  He performed a bit better with liquids but struggle with dry foods.  Therapy recommended respiratory muscle training and other compensatory strategies.  His voice continues to be quite hoarse.   Pain Inventory Average Pain 4 Pain Right Now 4 My pain is stabbing and aching  In the last 24 hours, has pain interfered with the following? General activity 6 Relation with others 0 Enjoyment of life 7 What TIME of day is your pain at its worst? night Sleep (in general) Fair  Pain is worse with: bending, standing and some activites Pain improves with: rest and medication Relief from Meds: 4  Mobility use a cane ability to climb steps?  yes do you drive?  yes  Function retired  Neuro/Psych bladder control problems bowel control problems weakness numbness tremor tingling trouble walking spasms  Prior Studies Any changes since last visit?  no  Physicians involved in your care Any changes since last visit?  no   Family History  Problem Relation Age of Onset  . Heart attack Father   . Alzheimer's disease Mother   . Aneurysm Brother        brain   Social History   Socioeconomic History  . Marital status: Married    Spouse name: None  . Number of children: None  . Years of education: None  . Highest education level: None  Social Needs  . Financial resource strain: None  . Food insecurity - worry: None  . Food insecurity - inability: None    . Transportation needs - medical: None  . Transportation needs - non-medical: None  Occupational History  . Occupation: Pastor  Tobacco Use  . Smoking status: Former Games developermoker  . Smokeless tobacco: Never Used  Substance and Sexual Activity  . Alcohol use: None  . Drug use: None  . Sexual activity: None  Other Topics Concern  . None  Social History Narrative  . None   Past Surgical History:  Procedure Laterality Date  . ANTERIOR CERVICAL DECOMP/DISCECTOMY FUSION N/A 11/14/2014   Procedure: Cervical Five-Cervical Six  Anterior Cervical Decompression/Diskectomy/Fusion;  Surgeon: Maeola HarmanJoseph Stern, MD;  Location: [facility];  Service: Neurosurgery;  Laterality: N/A;   Past Medical History:  Diagnosis Date  . Bradycardia   . Chronic abdominal pain   . Hesitancy of micturition    BP 114/73   Pulse (!) 51   SpO2 93%   Opioid Risk Score:   Fall Risk Score:  `1  Depression screen PHQ 2/9  [facility] 12/24/2015 01/09/2015  Decreased Interest 0 0  Down, Depressed, Hopeless 0 0  PHQ - 2 Score 0 0  Altered sleeping - 0  Tired, decreased energy - 1  Change in appetite - 1  Feeling bad or failure about yourself  - 0  Trouble concentrating - 0  Moving slowly or fidgety/restless - 0  Suicidal thoughts - 0  PHQ-9 Score -  2     Review of Systems  Constitutional: Positive for unexpected weight change.  HENT: Negative.   Eyes: Negative.   Respiratory: Negative.   Cardiovascular: Negative.   Gastrointestinal: Negative.   Endocrine: Negative.   Genitourinary: Negative.   Musculoskeletal: Negative.   Skin: Negative.   Allergic/Immunologic: Negative.   Neurological: Negative.   Hematological: Negative.   Psychiatric/Behavioral: Negative.   All other systems reviewed and are negative.      Objective:   Physical Exam Constitutional: He is oriented to person, place, and time. He appears well-developed and well-nourished.  HENT: voice weak, hoars Head:  Normocephalic and atraumatic.  Eyes: Conjunctivae are normal. Pupils are equal, round, and reactive to light.  Neck:    Cardiovascular: Heart rate 51.  Respiratory: normal effort, shallow breaths.  GI: soft  Genitourinary: Not examined Musculoskeletal: mild left shoulder pain with IR/ER Neurological: He is alert and oriented to person, place, and time. No cranial nerve deficit. Coordination normal.  Patient has normal sensationabove theC6 dermatome. Reduced PP/LT from C6 through sacral levels Patient has decreased tone in bilateral Lower extremities 4/5 bilateral deltoids, biceps, 4/5triceps, 4 wrist extensors, 4-/5 right HI, 4/5 left HI, right HF, KE and ADF/APF 4/5. 4/5 left hip flexors, 4/5 knee extensors 4- ankle dorsiflexors and plantar flexors, left remainsweaker than right. No resting tone LUE. Occasional cog wheeling of LUE and RUE. No resting tremor.  Motor and sensory exam unchanged Gait stablewith cane---   Skin: Skin is warm and dry.   Psych: pleasant as always,remainsflat   Assessment/Plan:   1. Functional deficits secondary to C6 ASIA C incomplete tetraplegia, spinal cord injury due to fall with C5-C6 fracture and neurogenic bowel and bladder  - continuewith HEP.   2. For spasms: continue baclofen (5mg ) at bedtime.  3. Pain Management: gabapentin continue at 300mg  TID--refilled today - tramadol q6 prn---continue for now during the day. -dc tramadol, consider trial of hydrocodone perhaps if needed -in the meantime, begin pamelor 10mg  qhs pain close attention to heart rate and arousal.. 4. Mood: definitely playing a rolealthough he denies -continue cymbalta.  Refill this today 5. Neurogenic bladder---I/O caths- ?coude tip cath #16 -follow up with urology.                         - 300-500 cc caths 6. Neurogenic bowel: senna in pm and fleet enema qam. -sorbitol, miralax for rescue 7. Chronic  bradycardia---baseline around 40.  Family physician aware and has been worked up extensively for his bradycardia in the past. 8.  Dysphagia: MBS with oro-pharyngeal delay and decreased laryngeal elevation. delaye UES closure also with weak respiratory muscle functioning during speech, cough, etc     -reviewed a few deep breathing exercises with patient today    -made referral to Dr. Suszanne Conners for assessment of larynx    -continue with recs per SLP as well      15 minutes of face to face patient care time were spent during this visit.  Follow up in 2 months.

## 2017-05-20 NOTE — Patient Instructions (Signed)
PLEASE FEEL FREE TO CALL OUR OFFICE WITH ANY PROBLEMS OR QUESTIONS ([phone])      

## 2017-06-22 ENCOUNTER — Other Ambulatory Visit: Payer: Self-pay | Admitting: Physical Medicine & Rehabilitation

## 2017-06-22 DIAGNOSIS — M792 Neuralgia and neuritis, unspecified: Secondary | ICD-10-CM

## 2017-07-20 ENCOUNTER — Encounter: Payer: Medicare Other | Attending: Physical Medicine & Rehabilitation | Admitting: Physical Medicine & Rehabilitation

## 2017-07-20 ENCOUNTER — Encounter: Payer: Self-pay | Admitting: Physical Medicine & Rehabilitation

## 2017-07-20 ENCOUNTER — Other Ambulatory Visit: Payer: Self-pay

## 2017-07-20 VITALS — BP 118/73 | HR 47 | Ht 67.0 in | Wt 168.0 lb

## 2017-07-20 DIAGNOSIS — Z5181 Encounter for therapeutic drug level monitoring: Secondary | ICD-10-CM | POA: Diagnosis not present

## 2017-07-20 DIAGNOSIS — G894 Chronic pain syndrome: Secondary | ICD-10-CM

## 2017-07-20 DIAGNOSIS — G8929 Other chronic pain: Secondary | ICD-10-CM | POA: Diagnosis not present

## 2017-07-20 DIAGNOSIS — G959 Disease of spinal cord, unspecified: Secondary | ICD-10-CM | POA: Diagnosis not present

## 2017-07-20 DIAGNOSIS — S12500A Unspecified displaced fracture of sixth cervical vertebra, initial encounter for closed fracture: Secondary | ICD-10-CM | POA: Diagnosis not present

## 2017-07-20 DIAGNOSIS — R490 Dysphonia: Secondary | ICD-10-CM | POA: Insufficient documentation

## 2017-07-20 DIAGNOSIS — Z79891 Long term (current) use of opiate analgesic: Secondary | ICD-10-CM | POA: Diagnosis not present

## 2017-07-20 DIAGNOSIS — N319 Neuromuscular dysfunction of bladder, unspecified: Secondary | ICD-10-CM | POA: Insufficient documentation

## 2017-07-20 DIAGNOSIS — R1310 Dysphagia, unspecified: Secondary | ICD-10-CM | POA: Diagnosis not present

## 2017-07-20 DIAGNOSIS — S14106S Unspecified injury at C6 level of cervical spinal cord, sequela: Secondary | ICD-10-CM

## 2017-07-20 DIAGNOSIS — G8250 Quadriplegia, unspecified: Secondary | ICD-10-CM | POA: Diagnosis not present

## 2017-07-20 DIAGNOSIS — R109 Unspecified abdominal pain: Secondary | ICD-10-CM | POA: Insufficient documentation

## 2017-07-20 DIAGNOSIS — R001 Bradycardia, unspecified: Secondary | ICD-10-CM | POA: Diagnosis not present

## 2017-07-20 DIAGNOSIS — M545 Low back pain: Secondary | ICD-10-CM | POA: Diagnosis not present

## 2017-07-20 DIAGNOSIS — Z87891 Personal history of nicotine dependence: Secondary | ICD-10-CM | POA: Insufficient documentation

## 2017-07-20 DIAGNOSIS — G9589 Other specified diseases of spinal cord: Secondary | ICD-10-CM | POA: Diagnosis present

## 2017-07-20 DIAGNOSIS — N390 Urinary tract infection, site not specified: Secondary | ICD-10-CM | POA: Insufficient documentation

## 2017-07-20 DIAGNOSIS — M792 Neuralgia and neuritis, unspecified: Secondary | ICD-10-CM | POA: Diagnosis not present

## 2017-07-20 DIAGNOSIS — M25512 Pain in left shoulder: Secondary | ICD-10-CM | POA: Diagnosis not present

## 2017-07-20 MED ORDER — NORTRIPTYLINE HCL 25 MG PO CAPS: 25.0000 mg | ORAL_CAPSULE | Freq: Every day | ORAL | 2 refills | Status: DC

## 2017-07-20 NOTE — Patient Instructions (Signed)
PLEASE FEEL FREE TO CALL OUR OFFICE WITH ANY PROBLEMS OR QUESTIONS ([phone])      

## 2017-07-20 NOTE — Progress Notes (Signed)
Subjective:    Patient ID: Carl [name], male    DOB: 1951/01/18, 67 y.o.   MRN: [id number]  HPI   Carl [name] is here in follow up of his cervical myelopathy. He saw Dr. Suszanne Conners for his phonation, who found a weak vocal cord. He recommended vocal cord therapy apparently. He may be going to [facility] for therapy, but I'm not sure.   He started the pamelor in February but didn't see much of a difference. He wasn't given any refills by pharmacy and thus stopped.  He remains active with his cane. He denies any falls. There has been no changes with his bowels and bladder habits.     Pain Inventory Average Pain 5 Pain Right Now 6 My pain is sharp, stabbing, tingling and aching  In the last 24 hours, has pain interfered with the following? General activity 7 Relation with others 2 Enjoyment of life 8 What TIME of day is your pain at its worst? morning evening Sleep (in general) Fair  Pain is worse with: walking, bending, standing and some activites Pain improves with: rest and medication Relief from Meds: 3  Mobility use a cane how many minutes can you walk? 20 ability to climb steps?  yes do you drive?  yes  Function not employed: date last employed n/a retired I need assistance with the following:  household duties and shopping  Neuro/Psych bladder control problems bowel control problems weakness numbness tremor tingling trouble walking spasms  Prior Studies Any changes since last visit?  no  Physicians involved in your care Any changes since last visit?  no   Family History  Problem Relation Age of Onset  . Heart attack Father   . Alzheimer's disease Mother   . Aneurysm Brother        brain   Social History   Socioeconomic History  . Marital status: Married    Spouse name: Not on file  . Number of children: Not on file  . Years of education: Not on file  . Highest education level: Not on file  Occupational History  . Occupation: Education officer, environmental  Social Needs  .  Financial resource strain: Not on file  . Food insecurity:    Worry: Not on file    Inability: Not on file  . Transportation needs:    Medical: Not on file    Non-medical: Not on file  Tobacco Use  . Smoking status: Former Games developer  . Smokeless tobacco: Never Used  Substance and Sexual Activity  . Alcohol use: Not on file  . Drug use: Not on file  . Sexual activity: Not on file  Lifestyle  . Physical activity:    Days per week: Not on file    Minutes per session: Not on file  . Stress: Not on file  Relationships  . Social connections:    Talks on phone: Not on file    Gets together: Not on file    Attends religious service: Not on file    Active member of club or organization: Not on file    Attends meetings of clubs or organizations: Not on file    Relationship status: Not on file  Other Topics Concern  . Not on file  Social History Narrative  . Not on file   Past Surgical History:  Procedure Laterality Date  . ANTERIOR CERVICAL DECOMP/DISCECTOMY FUSION N/A 11/14/2014   Procedure: Cervical Five-Cervical Six  Anterior Cervical Decompression/Diskectomy/Fusion;  Surgeon: Maeola Harman, MD;  Location: [facility];  Service: Neurosurgery;  Laterality: N/A;   Past Medical History:  Diagnosis Date  . Bradycardia   . Chronic abdominal pain   . Hesitancy of micturition    BP 118/73   Pulse (!) 47   Ht 5\' 7"  (1.702 m)   Wt 168 lb (76.2 kg)   SpO2 97%   BMI 26.31 kg/m   Opioid Risk Score:   Fall Risk Score:  `1  Depression screen PHQ 2/9  Depression screen [facility] 2/9 07/20/2017 12/24/2015 01/09/2015  Decreased Interest 0 0 0  Down, Depressed, Hopeless 0 0 0  PHQ - 2 Score 0 0 0  Altered sleeping - - 0  Tired, decreased energy - - 1  Change in appetite - - 1  Feeling bad or failure about yourself  - - 0  Trouble concentrating - - 0  Moving slowly or fidgety/restless - - 0  Suicidal thoughts - - 0  PHQ-9 Score - - 2    Review of Systems  Constitutional: Negative.     HENT: Negative.   Eyes: Negative.   Respiratory: Negative.   Gastrointestinal: Positive for abdominal pain.  Endocrine: Negative.   Genitourinary: Negative.   Musculoskeletal: Negative.   Skin: Negative.   Allergic/Immunologic: Negative.   Neurological: Negative.   Psychiatric/Behavioral: Negative.   All other systems reviewed and are negative.      Objective:   Physical Exam  General: No acute distress HEENT: EOMI, oral membranes moist Cards: reg rate  Chest: normal effort Abdomen: Soft, NT, ND Skin: dry, intact Extremities: no edema  Musculoskeletal: mild left shoulder pain with IR/ER--unchanged Neurological: He is alert and oriented to person, place, and time. No cranial nerve deficit. Coordination normal.  Patient has normal sensationabove theC6 dermatome. Reduced PP/LT below C6 through sacral levels Patient has minimal tone in bilateral Lower extremities 4/5 bilateral deltoids, biceps, 4/5triceps, 4 wrist extensors, 4-/5 right HI, 4/5 left HI, right HF, KE and ADF/APF 4/5. 4/5 left hip flexors, 4/5 knee extensors 4- ankle dorsiflexors and plantar flexors, left remainsweaker than right.  LUE. Occasional cog wheeling of LUE and RUE.  Motor and sensory stable   Skin: Skin is warm and dry.   Psych: pleasant as always,remainsflat   Assessment/Plan:   1. Functional deficits secondary to C6 ASIA C incomplete tetraplegia, spinal cord injury due to fall with C5-C6 fracture and neurogenic bowel and bladder  - continuewith HEP. See no changes in motor exam  2. For spasms: continue baclofen (5mg ) at bedtime.  3. Pain Management: gabapentin continue at 300mg  TID--refilled today - tramadol q6 prn---continue for now during the day. -We will continue the controlled substance monitoring program, this consists of regular clinic visits, examinations, routine drug screening, pill counts as well as use of [place] Controlled Substance Reporting System.  [place] today.  -drug swab test today -  Titrate pamelor to 25mg  qhs Consider hydrocodone or buprenorphine patch. 4. Mood: definitely playing a rolealthough he denies -continue cymbalta.Refill this today 5. Neurogenic bladder---I/O caths- ?coude tip cath #16 -follow up with urology. - 300-500 cc caths 6. Neurogenic bowel: senna in pm and fleet enema qam. -sorbitol, miralax for rescue 7. Chronic bradycardia---baseline around 40. 8.Dysphagia: MBS with oro-pharyngeal delay and decreased laryngeal elevation. delaye UES closure also with weak respiratory muscle functioning during speech, cough, etc                         -continue plan per ENT, follow up SLP  15  minutes of face to face patient care time were spent during this visit.  Follow up in 2 months.

## 2017-07-24 LAB — DRUG TOX MONITOR 1 W/CONF, ORAL FLD
Amphetamines: NEGATIVE ng/mL (ref <10)
BARBITURATES: NEGATIVE ng/mL (ref <10)
BENZODIAZEPINES: NEGATIVE ng/mL (ref <0.50)
BUPRENORPHINE: NEGATIVE ng/mL (ref <0.10)
Cocaine: NEGATIVE ng/mL (ref <5.0)
Fentanyl: NEGATIVE ng/mL (ref <0.10)
Heroin Metabolite: NEGATIVE ng/mL (ref <1.0)
MARIJUANA: NEGATIVE ng/mL (ref <2.5)
MDMA: NEGATIVE ng/mL (ref <10)
MEPROBAMATE: NEGATIVE ng/mL (ref <2.5)
Methadone: NEGATIVE ng/mL (ref <5.0)
Nicotine Metabolite: NEGATIVE ng/mL (ref <5.0)
Opiates: NEGATIVE ng/mL (ref <2.5)
Phencyclidine: NEGATIVE ng/mL (ref <10)
Tapentadol: NEGATIVE ng/mL (ref <5.0)
Tramadol: POSITIVE ng/mL — AB (ref <5.0)
ZOLPIDEM: NEGATIVE ng/mL (ref <5.0)

## 2017-07-24 LAB — DRUG TOX ALC METAB W/CON, ORAL FLD: Alcohol Metabolite: NEGATIVE ng/mL (ref <25)

## 2017-07-29 ENCOUNTER — Telehealth: Payer: Self-pay | Admitting: *Deleted

## 2017-07-29 NOTE — Telephone Encounter (Signed)
Oral swab drug screen was consistent for prescribed medications.  ?

## 2017-09-04 ENCOUNTER — Other Ambulatory Visit: Payer: Self-pay | Admitting: Physical Medicine & Rehabilitation

## 2017-09-04 DIAGNOSIS — N319 Neuromuscular dysfunction of bladder, unspecified: Secondary | ICD-10-CM

## 2017-09-04 DIAGNOSIS — K592 Neurogenic bowel, not elsewhere classified: Secondary | ICD-10-CM

## 2017-09-04 DIAGNOSIS — G8250 Quadriplegia, unspecified: Secondary | ICD-10-CM

## 2017-09-04 DIAGNOSIS — S14106S Unspecified injury at C6 level of cervical spinal cord, sequela: Secondary | ICD-10-CM

## 2017-09-08 ENCOUNTER — Other Ambulatory Visit: Payer: Self-pay | Admitting: Physical Medicine & Rehabilitation

## 2017-09-08 DIAGNOSIS — S14106S Unspecified injury at C6 level of cervical spinal cord, sequela: Secondary | ICD-10-CM

## 2017-09-08 DIAGNOSIS — N319 Neuromuscular dysfunction of bladder, unspecified: Secondary | ICD-10-CM

## 2017-09-08 DIAGNOSIS — K592 Neurogenic bowel, not elsewhere classified: Secondary | ICD-10-CM

## 2017-09-08 DIAGNOSIS — G8250 Quadriplegia, unspecified: Secondary | ICD-10-CM

## 2017-09-14 ENCOUNTER — Ambulatory Visit: Payer: Medicare Other | Admitting: Physical Medicine & Rehabilitation

## 2017-09-21 ENCOUNTER — Other Ambulatory Visit: Payer: Self-pay | Admitting: Physical Medicine & Rehabilitation

## 2017-09-21 ENCOUNTER — Encounter: Payer: Medicare Other | Admitting: Physical Medicine & Rehabilitation

## 2017-09-21 DIAGNOSIS — F329 Major depressive disorder, single episode, unspecified: Secondary | ICD-10-CM

## 2017-09-28 ENCOUNTER — Encounter: Payer: Medicare Other | Attending: Physical Medicine & Rehabilitation | Admitting: Physical Medicine & Rehabilitation

## 2017-09-28 ENCOUNTER — Encounter: Payer: Self-pay | Admitting: Physical Medicine & Rehabilitation

## 2017-09-28 VITALS — BP 114/69 | HR 51 | Resp 14 | Ht 68.0 in | Wt 174.0 lb

## 2017-09-28 DIAGNOSIS — R490 Dysphonia: Secondary | ICD-10-CM | POA: Insufficient documentation

## 2017-09-28 DIAGNOSIS — Z87891 Personal history of nicotine dependence: Secondary | ICD-10-CM | POA: Insufficient documentation

## 2017-09-28 DIAGNOSIS — R1310 Dysphagia, unspecified: Secondary | ICD-10-CM | POA: Insufficient documentation

## 2017-09-28 DIAGNOSIS — K592 Neurogenic bowel, not elsewhere classified: Secondary | ICD-10-CM | POA: Diagnosis not present

## 2017-09-28 DIAGNOSIS — G894 Chronic pain syndrome: Secondary | ICD-10-CM | POA: Diagnosis not present

## 2017-09-28 DIAGNOSIS — S14106S Unspecified injury at C6 level of cervical spinal cord, sequela: Secondary | ICD-10-CM | POA: Diagnosis not present

## 2017-09-28 DIAGNOSIS — R001 Bradycardia, unspecified: Secondary | ICD-10-CM | POA: Diagnosis not present

## 2017-09-28 DIAGNOSIS — G959 Disease of spinal cord, unspecified: Secondary | ICD-10-CM | POA: Insufficient documentation

## 2017-09-28 DIAGNOSIS — N319 Neuromuscular dysfunction of bladder, unspecified: Secondary | ICD-10-CM | POA: Diagnosis not present

## 2017-09-28 DIAGNOSIS — G8250 Quadriplegia, unspecified: Secondary | ICD-10-CM

## 2017-09-28 DIAGNOSIS — G8929 Other chronic pain: Secondary | ICD-10-CM | POA: Diagnosis not present

## 2017-09-28 DIAGNOSIS — N390 Urinary tract infection, site not specified: Secondary | ICD-10-CM | POA: Diagnosis not present

## 2017-09-28 DIAGNOSIS — S12500A Unspecified displaced fracture of sixth cervical vertebra, initial encounter for closed fracture: Secondary | ICD-10-CM | POA: Diagnosis not present

## 2017-09-28 DIAGNOSIS — M25512 Pain in left shoulder: Secondary | ICD-10-CM | POA: Insufficient documentation

## 2017-09-28 DIAGNOSIS — M545 Low back pain: Secondary | ICD-10-CM | POA: Diagnosis not present

## 2017-09-28 DIAGNOSIS — R109 Unspecified abdominal pain: Secondary | ICD-10-CM | POA: Insufficient documentation

## 2017-09-28 DIAGNOSIS — G9589 Other specified diseases of spinal cord: Secondary | ICD-10-CM | POA: Diagnosis present

## 2017-09-28 MED ORDER — BUPRENORPHINE 5 MCG/HR TD PTWK: 5.0000 ug | MEDICATED_PATCH | TRANSDERMAL | 3 refills | Status: DC

## 2017-09-28 MED ORDER — TRAMADOL HCL 50 MG PO TABS: 50.0000 mg | ORAL_TABLET | Freq: Four times a day (QID) | ORAL | 2 refills | Status: DC | PRN

## 2017-09-28 MED ORDER — GABAPENTIN 300 MG PO CAPS: 300.0000 mg | ORAL_CAPSULE | Freq: Three times a day (TID) | ORAL | 6 refills | Status: DC

## 2017-09-28 NOTE — Progress Notes (Signed)
Subjective:    Patient ID: Carl [name], male    DOB: 09-04-1950, 67 y.o.   MRN: [id number]  HPI   Mr Certain is here in follow up of his cervical myelopathy. [facility] with a complicated UTI/urosepsis? He has since recovered and feels that things are about the same. He didn't see much change with the pamelor increase. He is sleeping at night for the most part however. Tramadol seems to take the edge off the pain but doesn't help all that much.     He tries to stay active at home. In fact he's going down to [place] this [place].    Pain Inventory Average Pain 4 Pain Right Now 4 My pain is sharp, stabbing and tingling  In the last 24 hours, has pain interfered with the following? General activity 5 Relation with others 3 Enjoyment of life 7 What TIME of day is your pain at its worst? all Sleep (in general) Fair  Pain is worse with: walking, bending, standing and some activites Pain improves with: rest and medication Relief from Meds: 2  Mobility walk with assistance use a cane use a walker how many minutes can you walk? 30 ability to climb steps?  yes do you drive?  yes Do you have any goals in this area?  yes  Function retired I need assistance with the following:  household duties and shopping Do you have any goals in this area?  yes  Neuro/Psych bladder control problems bowel control problems weakness numbness tingling trouble walking spasms  Prior Studies Any changes since last visit?  no  Physicians involved in your care Any changes since last visit?  no   Family History  Problem Relation Age of Onset  . Heart attack Father   . Alzheimer's disease Mother   . Aneurysm Brother        brain   Social History   Socioeconomic History  . Marital status: Married    Spouse name: Not on file  . Number of children: Not on file  . Years of education: Not on file  . Highest education level: Not on file    Occupational History  . Occupation: Education officer, environmentalastor  Social Needs  . Financial resource strain: Not on file  . Food insecurity:    Worry: Not on file    Inability: Not on file  . Transportation needs:    Medical: Not on file    Non-medical: Not on file  Tobacco Use  . Smoking status: Former Games developermoker  . Smokeless tobacco: Never Used  Substance and Sexual Activity  . Alcohol use: Not on file  . Drug use: Not on file  . Sexual activity: Not on file  Lifestyle  . Physical activity:    Days per week: Not on file    Minutes per session: Not on file  . Stress: Not on file  Relationships  . Social connections:    Talks on phone: Not on file    Gets together: Not on file    Attends religious service: Not on file    Active member of club or organization: Not on file    Attends meetings of clubs or organizations: Not on file    Relationship status: Not on file  Other Topics Concern  . Not on file  Social History Narrative  . Not on file   Past Surgical History:  Procedure Laterality Date  . ANTERIOR CERVICAL DECOMP/DISCECTOMY  FUSION N/A 11/14/2014   Procedure: Cervical Five-Cervical Six  Anterior Cervical Decompression/Diskectomy/Fusion;  Surgeon: Maeola Harman, MD;  Location: [facility];  Service: Neurosurgery;  Laterality: N/A;   Past Medical History:  Diagnosis Date  . Bradycardia   . Chronic abdominal pain   . Hesitancy of micturition    BP 114/69 (BP Location: Right Arm, Patient Position: Sitting, Cuff Size: Normal)   Pulse (!) 51   Resp 14   Ht 5\' 8"  (1.727 m)   Wt 174 lb (78.9 kg)   SpO2 97%   BMI 26.46 kg/m   Opioid Risk Score:   Fall Risk Score:  `1  Depression screen PHQ 2/9  Depression screen [facility] 2/9 07/20/2017 12/24/2015 01/09/2015  Decreased Interest 0 0 0  Down, Depressed, Hopeless 0 0 0  PHQ - 2 Score 0 0 0  Altered sleeping - - 0  Tired, decreased energy - - 1  Change in appetite - - 1  Feeling bad or failure about yourself  - - 0  Trouble concentrating - -  0  Moving slowly or fidgety/restless - - 0  Suicidal thoughts - - 0  PHQ-9 Score - - 2    Review of Systems  Constitutional: Negative.   HENT: Negative.   Eyes: Negative.   Respiratory: Negative.   Cardiovascular: Negative.   Gastrointestinal: Positive for abdominal pain.  Endocrine: Negative.   Genitourinary: Negative.   Musculoskeletal: Positive for arthralgias, back pain, gait problem and myalgias.       Spasms   Skin: Negative.   Allergic/Immunologic: Negative.   Neurological: Positive for weakness and numbness.       Tingling  Hematological: Negative.   Psychiatric/Behavioral: Negative.        Objective:   Physical Exam General: No acute distress HEENT: EOMI, oral membranes moist Cards: reg rate  Chest: normal effort Abdomen: Soft, NT, ND Skin: dry, intact Extremities: no edema Musculoskeletal: mild left shoulder pain with IR/ER--unchanged Neurological: He is alert and oriented to person, place, and time. No cranial nerve deficit. Coordination normal.  Preserved sensory above theC6 dermatome. Reduced PP/LT below C6 through sacral levels Patient has minimal tone in bilateral Lower extremities 4/5 bilateral deltoids, biceps, 4/5triceps, 4 wrist extensors, 4-/5 right HI, 4/5 left HI, right HF, KE and ADF/APF 4/5. 4/5 left hip flexors, 4/5 knee extensors 4- ankle dorsiflexors and plantar flexors, left remainsweaker than right.  LUE. Occasional cog wheeling of LUE and RUE.  Motor and sensory stable  Skin: Skin is warm and dry.   Psych: pleasant as always,remainsflat   Assessment/Plan:   1. Functional deficits secondary to C6 ASIA C incomplete tetraplegia, spinal cord injury due to fall with C5-C6 fracture and neurogenic bowel and bladder  - continuewith HEP.   2. For spasms: continue baclofen (5mg ) at bedtime.  3. Pain Management: gabapentin continue at 300mg  TID--refilled today - tramadol q6 prn---continue for now during the day. -We  will continue the controlled substance monitoring program, this consists of regular clinic visits, examinations, routine drug screening, pill counts as well as use of [place] Controlled Substance Reporting System. [place] was reviewed today.  -drug swab test today - continue pamelor   25mg  qhs -begin trial buprenorphine patch per week 4. Mood: definitely playing a rolealthough he denies -continue cymbalta.Refill this today 5. Neurogenic bladder---I/O caths- ?coude tip cath #16 -follow up with urology. -300-500 cc caths 6. Neurogenic bowel: senna in pm and fleet enema qam. -sorbitol, miralax for rescue may continue 7. Chronic  bradycardia---baseline around 40. 8.Dysphagia:MBS with oro-pharyngeal delay and decreased laryngeal elevation. delaye UES closure also with weak respiratory muscle functioning during speech, cough, etc -continue plan per ENT, follow up SLP      15 minutes of face to face patient care time were spent during this visit.Follow up in 2 months.

## 2017-09-28 NOTE — Patient Instructions (Signed)
PLEASE FEEL FREE TO CALL OUR OFFICE WITH ANY PROBLEMS OR QUESTIONS ([phone])      

## 2017-10-16 ENCOUNTER — Other Ambulatory Visit: Payer: Self-pay | Admitting: Physical Medicine & Rehabilitation

## 2017-10-29 ENCOUNTER — Encounter: Payer: Medicare Other | Attending: Physical Medicine & Rehabilitation | Admitting: Registered Nurse

## 2017-10-29 ENCOUNTER — Encounter: Payer: Self-pay | Admitting: Registered Nurse

## 2017-10-29 VITALS — BP 94/61 | HR 81 | Temp 98.7°F | Resp 14 | Ht 68.0 in | Wt 172.0 lb

## 2017-10-29 DIAGNOSIS — N390 Urinary tract infection, site not specified: Secondary | ICD-10-CM | POA: Insufficient documentation

## 2017-10-29 DIAGNOSIS — G8250 Quadriplegia, unspecified: Secondary | ICD-10-CM | POA: Insufficient documentation

## 2017-10-29 DIAGNOSIS — S12500A Unspecified displaced fracture of sixth cervical vertebra, initial encounter for closed fracture: Secondary | ICD-10-CM | POA: Insufficient documentation

## 2017-10-29 DIAGNOSIS — K592 Neurogenic bowel, not elsewhere classified: Secondary | ICD-10-CM | POA: Diagnosis not present

## 2017-10-29 DIAGNOSIS — S14106S Unspecified injury at C6 level of cervical spinal cord, sequela: Secondary | ICD-10-CM

## 2017-10-29 DIAGNOSIS — G9589 Other specified diseases of spinal cord: Secondary | ICD-10-CM | POA: Diagnosis present

## 2017-10-29 DIAGNOSIS — Z5181 Encounter for therapeutic drug level monitoring: Secondary | ICD-10-CM

## 2017-10-29 DIAGNOSIS — R109 Unspecified abdominal pain: Secondary | ICD-10-CM | POA: Insufficient documentation

## 2017-10-29 DIAGNOSIS — G8929 Other chronic pain: Secondary | ICD-10-CM | POA: Insufficient documentation

## 2017-10-29 DIAGNOSIS — R1310 Dysphagia, unspecified: Secondary | ICD-10-CM | POA: Insufficient documentation

## 2017-10-29 DIAGNOSIS — N319 Neuromuscular dysfunction of bladder, unspecified: Secondary | ICD-10-CM | POA: Diagnosis not present

## 2017-10-29 DIAGNOSIS — Z87891 Personal history of nicotine dependence: Secondary | ICD-10-CM | POA: Insufficient documentation

## 2017-10-29 DIAGNOSIS — M545 Low back pain: Secondary | ICD-10-CM | POA: Insufficient documentation

## 2017-10-29 DIAGNOSIS — Z79891 Long term (current) use of opiate analgesic: Secondary | ICD-10-CM

## 2017-10-29 DIAGNOSIS — G959 Disease of spinal cord, unspecified: Secondary | ICD-10-CM | POA: Diagnosis not present

## 2017-10-29 DIAGNOSIS — R490 Dysphonia: Secondary | ICD-10-CM | POA: Insufficient documentation

## 2017-10-29 DIAGNOSIS — R001 Bradycardia, unspecified: Secondary | ICD-10-CM | POA: Insufficient documentation

## 2017-10-29 DIAGNOSIS — M25512 Pain in left shoulder: Secondary | ICD-10-CM | POA: Insufficient documentation

## 2017-10-29 DIAGNOSIS — G894 Chronic pain syndrome: Secondary | ICD-10-CM

## 2017-10-29 NOTE — Progress Notes (Signed)
Subjective:    Patient ID: Carl [name], male    DOB: [birth year], 67 y.o.   MRN: [id number]  HPI: [name] is a 67 year old male who returns in follow up of his spinal cord injury. He states his pain is located in his left arm and left hand, mid-lower back. He rates his pain 6. His current exercise regime is walking.   Continue with In and out Caths/ 200-500 cc with caths.   [name] reports the Butrans patch has helped with his pain, we will continue to monitor.   [name] also reports he was having a fever on and off for the last three days, today he is afebrile. Instructed to follow up with his PCP and Urologist, he and his wife verbalizes understanding.   [name] 20.00 MME. Last Oral Swab was Performed on 07/20/2017, it was consistent.    Pain Inventory Average Pain 5 Pain Right Now 6 My pain is sharp, dull, stabbing and aching  In the last 24 hours, has pain interfered with the following? General activity 5 Relation with others 5 Enjoyment of life 8 What TIME of day is your pain at its worst? morning, night Sleep (in general) Fair  Pain is worse with: walking, bending, standing and some activites Pain improves with: rest and medication Relief from Meds: 3  Mobility walk with assistance use a cane how many minutes can you walk? 30 ability to climb steps?  yes do you drive?  yes  Function retired I need assistance with the following:  household duties  Neuro/Psych weakness numbness tremor tingling trouble walking spasms  Prior Studies Any changes since last visit?  no  Physicians involved in your care Any changes since last visit?  no   Family History  Problem Relation Age of Onset  . Heart attack Father   . Alzheimer's disease Mother   . Aneurysm Brother        brain   Social History   Socioeconomic History  . Marital status: Married    Spouse name: Not on file  . Number of children: Not on file  .  Years of education: Not on file  . Highest education level: Not on file  Occupational History  . Occupation: Education officer, environmentalastor  Social Needs  . Financial resource strain: Not on file  . Food insecurity:    Worry: Not on file    Inability: Not on file  . Transportation needs:    Medical: Not on file    Non-medical: Not on file  Tobacco Use  . Smoking status: Former Games developermoker  . Smokeless tobacco: Never Used  Substance and Sexual Activity  . Alcohol use: Not on file  . Drug use: Not on file  . Sexual activity: Not on file  Lifestyle  . Physical activity:    Days per week: Not on file    Minutes per session: Not on file  . Stress: Not on file  Relationships  . Social connections:    Talks on phone: Not on file    Gets together: Not on file    Attends religious service: Not on file    Active member of club or organization: Not on file    Attends meetings of clubs or organizations: Not on file    Relationship status: Not on file  Other Topics Concern  . Not on file  Social History Narrative  . Not on file   Past Surgical History:  Procedure Laterality Date  .  ANTERIOR CERVICAL DECOMP/DISCECTOMY FUSION N/A 11/14/2014   Procedure: Cervical Five-Cervical Six  Anterior Cervical Decompression/Diskectomy/Fusion;  Surgeon: Maeola Harman, MD;  Location: [facility];  Service: Neurosurgery;  Laterality: N/A;   Past Medical History:  Diagnosis Date  . Bradycardia   . Chronic abdominal pain   . Hesitancy of micturition    BP 94/61 (BP Location: Right Arm, Patient Position: Sitting, Cuff Size: Normal)   Pulse 81   Resp 14   Ht 5\' 8"  (1.727 m)   Wt 172 lb (78 kg)   SpO2 95%   BMI 26.15 kg/m   Opioid Risk Score:   Fall Risk Score:  `1  Depression screen PHQ 2/9  Depression screen [facility] 2/9 07/20/2017 12/24/2015 01/09/2015  Decreased Interest 0 0 0  Down, Depressed, Hopeless 0 0 0  PHQ - 2 Score 0 0 0  Altered sleeping - - 0  Tired, decreased energy - - 1  Change in appetite - - 1    Feeling bad or failure about yourself  - - 0  Trouble concentrating - - 0  Moving slowly or fidgety/restless - - 0  Suicidal thoughts - - 0  PHQ-9 Score - - 2    Review of Systems  Constitutional: Positive for appetite change, chills, diaphoresis and fever.  HENT: Negative.   Eyes: Negative.   Respiratory: Positive for shortness of breath.   Cardiovascular: Negative.   Gastrointestinal: Positive for abdominal pain.  Endocrine: Negative.   Genitourinary: Negative.   Musculoskeletal: Positive for arthralgias, back pain, gait problem, joint swelling and myalgias.  Allergic/Immunologic: Negative.   Neurological: Positive for tremors, weakness and numbness.       Tingling  Psychiatric/Behavioral: Negative.   All other systems reviewed and are negative.      Objective:   Physical Exam  Constitutional: He is oriented to person, place, and time. He appears well-developed and well-nourished.  HENT:  Head: Normocephalic and atraumatic.  Neck: Normal range of motion. Neck supple.  Cardiovascular: Normal rate and regular rhythm.  Pulmonary/Chest: Effort normal and breath sounds normal.  Musculoskeletal:  Normal Muscle Bulk and Muscle Testing Reveals: Upper Extremities: Full ROM and Muscle Strength 4/5 Thoracic Paraspinal Tenderness: T-7-T-11 Lumbar Paraspinal Tenderness: L-3-L-5 Lower Extremities: Right: Full ROM and Muscle Strength 5/5 Right Lower Extremity Flexion Produces Pain into Extremity Left: Decreased ROM and Muscle Strength 4/5 Left Lower Extremity Flexion Produces Pain into Extremity Arises from Table Slowly, using cane for support Antalgic gait  Neurological: He is alert and oriented to person, place, and time.  Skin: Skin is warm and dry.  Psychiatric: He has a normal mood and affect. His behavior is normal.  Nursing note and vitals reviewed.         Assessment & Plan:  1. C6 Spinal Cord Injury:/ . Spastic Tetraplegia: Continue Baclofen. Continue HEP as  Tolerated and Continue to monitor.  2. Neurogenic Bladder: Continue with I/O Caths Patient reports he performs  3 in and out  caths per day ranging 200-500 cc. Encouraged to increase fluid intake, wife reports he's not drinking adequate fluids.  4. Neurogenic Bowel: Continue Bowel Program:  5. Chronic Pain Syndrome: Continue Gabapentin, Pamelor, Butrans  50mcg/hr Patch weekly and Tramadol. No script given today.  We will continue the opioid monitoring program, this consists of regular clinic visits, examinations, urine drug screen, pill counts as well as use of [place] Controlled Substance Reporting system.  30 minutes of face to face patient care time was spent during this visit.  All questions were encouraged and answered.  F/U in 3 months with Dr. Riley Kill

## 2017-11-11 ENCOUNTER — Other Ambulatory Visit: Payer: Self-pay | Admitting: Physical Medicine & Rehabilitation

## 2017-11-11 DIAGNOSIS — G894 Chronic pain syndrome: Secondary | ICD-10-CM

## 2017-11-11 DIAGNOSIS — G8250 Quadriplegia, unspecified: Secondary | ICD-10-CM

## 2017-11-11 DIAGNOSIS — S14106S Unspecified injury at C6 level of cervical spinal cord, sequela: Secondary | ICD-10-CM

## 2017-11-11 DIAGNOSIS — N319 Neuromuscular dysfunction of bladder, unspecified: Secondary | ICD-10-CM

## 2017-11-11 DIAGNOSIS — K592 Neurogenic bowel, not elsewhere classified: Secondary | ICD-10-CM

## 2017-12-10 ENCOUNTER — Telehealth: Payer: Self-pay | Admitting: *Deleted

## 2017-12-10 DIAGNOSIS — G8250 Quadriplegia, unspecified: Secondary | ICD-10-CM

## 2017-12-10 DIAGNOSIS — K592 Neurogenic bowel, not elsewhere classified: Secondary | ICD-10-CM

## 2017-12-10 DIAGNOSIS — G894 Chronic pain syndrome: Secondary | ICD-10-CM

## 2017-12-10 DIAGNOSIS — S14106S Unspecified injury at C6 level of cervical spinal cord, sequela: Secondary | ICD-10-CM

## 2017-12-10 DIAGNOSIS — N319 Neuromuscular dysfunction of bladder, unspecified: Secondary | ICD-10-CM

## 2017-12-10 MED ORDER — TRAMADOL HCL 50 MG PO TABS: 50.0000 mg | ORAL_TABLET | Freq: Four times a day (QID) | ORAL | 1 refills | Status: DC | PRN

## 2017-12-10 NOTE — Telephone Encounter (Signed)
Mrs Harjo called and Ala needs a refill on his tramadol.  They are going out of town Sunday and would like to pick it up Saturday.  Last fill was 11/12/17.  Called to pharmacy and Mrs Esh notified.

## 2017-12-11 ENCOUNTER — Other Ambulatory Visit: Payer: Self-pay | Admitting: Physical Medicine & Rehabilitation

## 2017-12-11 DIAGNOSIS — F329 Major depressive disorder, single episode, unspecified: Secondary | ICD-10-CM

## 2018-01-05 ENCOUNTER — Other Ambulatory Visit: Payer: Self-pay | Admitting: Physical Medicine & Rehabilitation

## 2018-01-18 ENCOUNTER — Other Ambulatory Visit: Payer: Self-pay | Admitting: Physical Medicine & Rehabilitation

## 2018-01-18 DIAGNOSIS — S14106S Unspecified injury at C6 level of cervical spinal cord, sequela: Secondary | ICD-10-CM

## 2018-01-18 DIAGNOSIS — G894 Chronic pain syndrome: Secondary | ICD-10-CM

## 2018-01-25 ENCOUNTER — Encounter: Payer: Self-pay | Admitting: Physical Medicine & Rehabilitation

## 2018-01-25 ENCOUNTER — Encounter: Payer: Medicare Other | Attending: Physical Medicine & Rehabilitation | Admitting: Physical Medicine & Rehabilitation

## 2018-01-25 VITALS — BP 95/63 | HR 53 | Ht 68.0 in | Wt 175.0 lb

## 2018-01-25 DIAGNOSIS — G894 Chronic pain syndrome: Secondary | ICD-10-CM

## 2018-01-25 DIAGNOSIS — G8250 Quadriplegia, unspecified: Secondary | ICD-10-CM | POA: Diagnosis not present

## 2018-01-25 DIAGNOSIS — G959 Disease of spinal cord, unspecified: Secondary | ICD-10-CM | POA: Insufficient documentation

## 2018-01-25 DIAGNOSIS — Z87891 Personal history of nicotine dependence: Secondary | ICD-10-CM | POA: Insufficient documentation

## 2018-01-25 DIAGNOSIS — M545 Low back pain: Secondary | ICD-10-CM | POA: Insufficient documentation

## 2018-01-25 DIAGNOSIS — R001 Bradycardia, unspecified: Secondary | ICD-10-CM | POA: Insufficient documentation

## 2018-01-25 DIAGNOSIS — G8929 Other chronic pain: Secondary | ICD-10-CM | POA: Diagnosis not present

## 2018-01-25 DIAGNOSIS — N390 Urinary tract infection, site not specified: Secondary | ICD-10-CM

## 2018-01-25 DIAGNOSIS — R490 Dysphonia: Secondary | ICD-10-CM | POA: Insufficient documentation

## 2018-01-25 DIAGNOSIS — Z79891 Long term (current) use of opiate analgesic: Secondary | ICD-10-CM | POA: Diagnosis not present

## 2018-01-25 DIAGNOSIS — Z5181 Encounter for therapeutic drug level monitoring: Secondary | ICD-10-CM

## 2018-01-25 DIAGNOSIS — M792 Neuralgia and neuritis, unspecified: Secondary | ICD-10-CM

## 2018-01-25 DIAGNOSIS — M25512 Pain in left shoulder: Secondary | ICD-10-CM | POA: Insufficient documentation

## 2018-01-25 DIAGNOSIS — S12491S Other nondisplaced fracture of fifth cervical vertebra, sequela: Secondary | ICD-10-CM | POA: Diagnosis not present

## 2018-01-25 DIAGNOSIS — K592 Neurogenic bowel, not elsewhere classified: Secondary | ICD-10-CM

## 2018-01-25 DIAGNOSIS — S14106S Unspecified injury at C6 level of cervical spinal cord, sequela: Secondary | ICD-10-CM

## 2018-01-25 DIAGNOSIS — R1310 Dysphagia, unspecified: Secondary | ICD-10-CM | POA: Diagnosis not present

## 2018-01-25 DIAGNOSIS — R109 Unspecified abdominal pain: Secondary | ICD-10-CM | POA: Insufficient documentation

## 2018-01-25 DIAGNOSIS — G9589 Other specified diseases of spinal cord: Secondary | ICD-10-CM | POA: Diagnosis present

## 2018-01-25 DIAGNOSIS — A499 Bacterial infection, unspecified: Secondary | ICD-10-CM

## 2018-01-25 DIAGNOSIS — S12500A Unspecified displaced fracture of sixth cervical vertebra, initial encounter for closed fracture: Secondary | ICD-10-CM | POA: Insufficient documentation

## 2018-01-25 DIAGNOSIS — N319 Neuromuscular dysfunction of bladder, unspecified: Secondary | ICD-10-CM | POA: Insufficient documentation

## 2018-01-25 MED ORDER — TRAMADOL HCL 50 MG PO TABS: 50.0000 mg | ORAL_TABLET | Freq: Four times a day (QID) | ORAL | 1 refills | Status: DC | PRN

## 2018-01-25 MED ORDER — BUPRENORPHINE 10 MCG/HR TD PTWK: 10.0000 ug | MEDICATED_PATCH | TRANSDERMAL | 3 refills | Status: DC

## 2018-01-25 NOTE — Patient Instructions (Signed)
PLEASE FEEL FREE TO CALL OUR OFFICE WITH ANY PROBLEMS OR QUESTIONS ([phone])      

## 2018-01-25 NOTE — Progress Notes (Signed)
Subjective:    Patient ID: Carl [name], male    DOB: 1950-05-26, 67 y.o.   MRN: [id number]  HPI   Carl [name] is here in follow-up of his cervical myelopathy and associated functional deficits/pain.  When I saw him in June we did start him on a Butrans patch to help with some of his ongoing dysesthetic pain.  Per progress notes he reported initial improvement with the patch, 13mcg/hr after starting the patch in late July.  When I asked him today about it he stated that he did not notice any relief with the patch.  He may give him a bit of dry mouth above that what he is used to already.  He still uses tramadol for breakthrough pain throughout the day.  He is also on Pamelor at night.  He is gone to outpatient speech therapy for his dysphonia and weak vocal cord movement.  He feels that his voice may be a bit weaker because of the membranes being dry.  He states that he stays active as he can with jobs around the house.  Uses a cane for balance.  Denies any falls or recent mishaps.  Pain Inventory Average Pain 4 Pain Right Now 4 My pain is sharp, tingling and aching  In the last 24 hours, has pain interfered with the following? General activity 6 Relation with others 2 Enjoyment of life 8 What TIME of day is your pain at its worst? morning evening Sleep (in general) Fair  Pain is worse with: walking, bending, standing and some activites Pain improves with: rest and medication Relief from Meds: 4  Mobility use a cane use a walker how many minutes can you walk? 30 ability to climb steps?  yes do you drive?  yes  Function retired I need assistance with the following:  meal prep and household duties  Neuro/Psych bladder control problems bowel control problems weakness numbness tremor tingling trouble walking spasms  Prior Studies Any changes since last visit?  no x-rays CT/MRI  Physicians involved in your care Any changes since last visit?  no   Family History    Problem Relation Age of Onset  . Heart attack Father   . Alzheimer's disease Mother   . Aneurysm Brother        brain   Social History   Socioeconomic History  . Marital status: Married    Spouse name: Not on file  . Number of children: Not on file  . Years of education: Not on file  . Highest education level: Not on file  Occupational History  . Occupation: Education officer, environmental  Social Needs  . Financial resource strain: Not on file  . Food insecurity:    Worry: Not on file    Inability: Not on file  . Transportation needs:    Medical: Not on file    Non-medical: Not on file  Tobacco Use  . Smoking status: Former Games developer  . Smokeless tobacco: Never Used  Substance and Sexual Activity  . Alcohol use: Not on file  . Drug use: Not on file  . Sexual activity: Not on file  Lifestyle  . Physical activity:    Days per week: Not on file    Minutes per session: Not on file  . Stress: Not on file  Relationships  . Social connections:    Talks on phone: Not on file    Gets together: Not on file    Attends religious service: Not on file    Active  member of club or organization: Not on file    Attends meetings of clubs or organizations: Not on file    Relationship status: Not on file  Other Topics Concern  . Not on file  Social History Narrative  . Not on file   Past Surgical History:  Procedure Laterality Date  . ANTERIOR CERVICAL DECOMP/DISCECTOMY FUSION N/A 11/14/2014   Procedure: Cervical Five-Cervical Six  Anterior Cervical Decompression/Diskectomy/Fusion;  Surgeon: Maeola Harman, MD;  Location: [facility];  Service: Neurosurgery;  Laterality: N/A;   Past Medical History:  Diagnosis Date  . Bradycardia   . Chronic abdominal pain   . Hesitancy of micturition    BP 95/63   Pulse (!) 53   Ht 5\' 8"  (1.727 m)   Wt 175 lb (79.4 kg)   SpO2 95%   BMI 26.61 kg/m    Opioid Risk Score:   Fall Risk Score:  `1  Depression screen PHQ 2/9  Depression screen [facility] 2/9 07/20/2017  12/24/2015 01/09/2015  Decreased Interest 0 0 0  Down, Depressed, Hopeless 0 0 0  PHQ - 2 Score 0 0 0  Altered sleeping - - 0  Tired, decreased energy - - 1  Change in appetite - - 1  Feeling bad or failure about yourself  - - 0  Trouble concentrating - - 0  Moving slowly or fidgety/restless - - 0  Suicidal thoughts - - 0  PHQ-9 Score - - 2    Review of Systems  Constitutional: Negative.   HENT: Negative.   Eyes: Negative.   Respiratory: Positive for shortness of breath.   Cardiovascular: Negative.   Gastrointestinal: Positive for abdominal pain and constipation.  Endocrine: Negative.   Genitourinary: Negative.   Musculoskeletal: Negative.   Skin: Negative.   Allergic/Immunologic: Negative.   Neurological: Negative.   Hematological: Negative.   Psychiatric/Behavioral: Negative.   All other systems reviewed and are negative.      Objective:   Physical Exam General: No acute distress HEENT: EOMI, oral membranes moist. Voice stronger Cards: reg rate  Chest: normal effort Abdomen: Soft, NT, ND Skin: dry, intact Extremities: no edema Musculoskeletal: mild left shoulder pain with IR/ER--stable Neurological: He is alert and oriented to person, place, and time. No cranial nerve deficit. Coordination normal.  C6 sensory level. MOtor exam unchanged: 4/5 bilateral deltoids, biceps, 4/5triceps, 4 wrist extensors, 4-/5 right HI, 4/5 left HI, right HF, KE and ADF/APF 4/5. 4/5 left hip flexors, 4/5 knee extensors 4- ankle dorsiflexors and plantar flexors, left remainsweaker than right. LUE. Occasional cog wheeling of LUE and RUE.    Skin: Skin is warm and dry.   Psych:  Flat but pleasant   Assessment/Plan:   1. Functional deficits secondary to C6 ASIA C incomplete tetraplegia, spinal cord injury due to fall with C5-C6 fracture and neurogenic bowel and bladder  - continuewith HEP.needs to know limits  2. For spasms: continue baclofen (5mg ) at bedtime.  3.  Pain Management: gabapentin continue at 300mg  TID--refilled today -Continue Pamelor 25 mg nightly - tramadol 50 mg q6 prn #420 --- refilled today -We will continue the controlled substance monitoring program, this consists of regular clinic visits, examinations, routine drug screening, pill counts as well as use of [place] Controlled Substance Reporting System. [place] today.  -increase buprenorphine patch to 10 mcg per week, #4 with 3 refills 4. Mood: definitely playing a rolealthough he denies -continue cymbalta.  5. Neurogenic bladder---I/O caths- ?coude tip cath #16 -follow up with urology. -  300-500 cc caths  -UTI?---ua and ucx---will go to [facility] later this week to submit 6. Neurogenic bowel: senna in pm and fleet enema qam. -sorbitol, miralax for rescue may continue 7. Chronic bradycardia---baseline around 40.asymptomatic 8.Dysphagia:MBS with oro-pharyngeal delay and decreased laryngeal elevation. delaye UES closure also with weak respiratory muscle functioning during speech, cough, etc -ongoing plan per ENT, continued SLP -biotene for dry mouth    15 minutes of face to face patient care time were spent during this visit.Follow up in83months.

## 2018-01-27 LAB — URINALYSIS
Bilirubin, UA: NEGATIVE
GLUCOSE, UA: NEGATIVE
KETONES UA: NEGATIVE
NITRITE UA: POSITIVE — AB
SPEC GRAV UA: 1.012 (ref 1.005–1.030)
Urobilinogen, Ur: 0.2 mg/dL (ref 0.2–1.0)
pH, UA: >=9 — AB (ref 5.0–7.5)

## 2018-01-28 ENCOUNTER — Telehealth: Payer: Self-pay

## 2018-01-28 LAB — DRUG TOX MONITOR 1 W/CONF, ORAL FLD
AMPHETAMINES: NEGATIVE ng/mL (ref <10)
BENZODIAZEPINES: NEGATIVE ng/mL (ref <0.50)
BUPRENORPHINE: NEGATIVE ng/mL (ref <0.10)
Barbiturates: NEGATIVE ng/mL (ref <10)
Cocaine: NEGATIVE ng/mL (ref <5.0)
Fentanyl: NEGATIVE ng/mL (ref <0.10)
Heroin Metabolite: NEGATIVE ng/mL (ref <1.0)
MARIJUANA: NEGATIVE ng/mL (ref <2.5)
MDMA: NEGATIVE ng/mL (ref <10)
MEPROBAMATE: NEGATIVE ng/mL (ref <2.5)
Methadone: NEGATIVE ng/mL (ref <5.0)
Nicotine Metabolite: NEGATIVE ng/mL (ref <5.0)
Opiates: NEGATIVE ng/mL (ref <2.5)
Phencyclidine: NEGATIVE ng/mL (ref <10)
Tapentadol: NEGATIVE ng/mL (ref <5.0)
Tramadol: POSITIVE ng/mL — AB (ref <5.0)
ZOLPIDEM: NEGATIVE ng/mL (ref <5.0)

## 2018-01-28 LAB — DRUG TOX ALC METAB W/CON, ORAL FLD: ALCOHOL METABOLITE: NEGATIVE ng/mL (ref <25)

## 2018-01-28 NOTE — Telephone Encounter (Signed)
Pt calling requesting results of Urinalysis, possible UTI

## 2018-01-28 NOTE — Telephone Encounter (Signed)
Patient needs medication for his UTI. [facility] has not posted culture results yet. Please call patient or advise

## 2018-01-29 ENCOUNTER — Telehealth: Payer: Self-pay | Admitting: *Deleted

## 2018-01-29 MED ORDER — SULFAMETHOXAZOLE-TRIMETHOPRIM 400-80 MG PO TABS: 1.0000 | ORAL_TABLET | Freq: Two times a day (BID) | ORAL | 0 refills | Status: DC

## 2018-01-29 NOTE — Telephone Encounter (Signed)
I let Carl [name] know.  He says that he went to his PCP and they ordered something. I called the pharmacy and it was Levofloxacin. He also takes trimethoprim nightly.  Do you want them to fill the Bactrim?

## 2018-01-29 NOTE — Telephone Encounter (Addendum)
The culture portion of the order did not get processed (order still says needs to be collected).It was put in as ambulatory clinic collect, not lab collect.   I called [facility] and they cannot add now because there is a preservative they use on the U/A when the culture is not ordered so the specimen is not usable for this now.

## 2018-01-29 NOTE — Telephone Encounter (Signed)
Oral swab is positive for tramadol. It is negative for buprenorphine (Butrans) but there is no indication in clinic note when the pt last placed a patch on his skin.

## 2018-01-29 NOTE — Telephone Encounter (Signed)
Please follow up with lab and find out what culture results are. Thanks

## 2018-01-29 NOTE — Telephone Encounter (Signed)
I sent in rx for bactrim. If this doesn't clear up, he'll need to submit another sample.

## 2018-01-29 NOTE — Telephone Encounter (Signed)
He can just take the cipro. I cancelled the rx and called the pharmacy

## 2018-02-01 NOTE — Telephone Encounter (Signed)
Pt.notified

## 2018-02-10 ENCOUNTER — Other Ambulatory Visit: Payer: Self-pay | Admitting: Physical Medicine & Rehabilitation

## 2018-02-10 DIAGNOSIS — N319 Neuromuscular dysfunction of bladder, unspecified: Secondary | ICD-10-CM

## 2018-02-10 DIAGNOSIS — K592 Neurogenic bowel, not elsewhere classified: Secondary | ICD-10-CM

## 2018-02-10 DIAGNOSIS — S14106S Unspecified injury at C6 level of cervical spinal cord, sequela: Secondary | ICD-10-CM

## 2018-02-10 DIAGNOSIS — G894 Chronic pain syndrome: Secondary | ICD-10-CM

## 2018-02-10 DIAGNOSIS — G8250 Quadriplegia, unspecified: Secondary | ICD-10-CM

## 2018-02-12 ENCOUNTER — Other Ambulatory Visit: Payer: Self-pay

## 2018-02-12 DIAGNOSIS — G8250 Quadriplegia, unspecified: Secondary | ICD-10-CM

## 2018-02-12 DIAGNOSIS — G894 Chronic pain syndrome: Secondary | ICD-10-CM

## 2018-02-12 DIAGNOSIS — K592 Neurogenic bowel, not elsewhere classified: Secondary | ICD-10-CM

## 2018-02-12 DIAGNOSIS — N319 Neuromuscular dysfunction of bladder, unspecified: Secondary | ICD-10-CM

## 2018-02-12 DIAGNOSIS — S14106S Unspecified injury at C6 level of cervical spinal cord, sequela: Secondary | ICD-10-CM

## 2018-02-12 MED ORDER — TRAMADOL HCL 50 MG PO TABS: 50.0000 mg | ORAL_TABLET | Freq: Four times a day (QID) | ORAL | 1 refills | Status: DC | PRN

## 2018-03-14 ENCOUNTER — Other Ambulatory Visit: Payer: Self-pay | Admitting: Physical Medicine & Rehabilitation

## 2018-03-14 DIAGNOSIS — F329 Major depressive disorder, single episode, unspecified: Secondary | ICD-10-CM

## 2018-03-15 ENCOUNTER — Other Ambulatory Visit: Payer: Self-pay | Admitting: Physical Medicine & Rehabilitation

## 2018-03-15 DIAGNOSIS — K592 Neurogenic bowel, not elsewhere classified: Secondary | ICD-10-CM

## 2018-03-15 DIAGNOSIS — G8250 Quadriplegia, unspecified: Secondary | ICD-10-CM

## 2018-03-15 DIAGNOSIS — N319 Neuromuscular dysfunction of bladder, unspecified: Secondary | ICD-10-CM

## 2018-03-15 DIAGNOSIS — G894 Chronic pain syndrome: Secondary | ICD-10-CM

## 2018-03-15 DIAGNOSIS — S14106S Unspecified injury at C6 level of cervical spinal cord, sequela: Secondary | ICD-10-CM

## 2018-03-22 ENCOUNTER — Encounter: Payer: Medicare Other | Admitting: Physical Medicine & Rehabilitation

## 2018-03-23 ENCOUNTER — Encounter: Payer: Medicare Other | Attending: Physical Medicine & Rehabilitation | Admitting: Registered Nurse

## 2018-03-23 ENCOUNTER — Encounter: Payer: Self-pay | Admitting: Registered Nurse

## 2018-03-23 VITALS — BP 104/69 | HR 58 | Resp 15 | Ht 68.0 in | Wt 177.0 lb

## 2018-03-23 DIAGNOSIS — G959 Disease of spinal cord, unspecified: Secondary | ICD-10-CM | POA: Diagnosis not present

## 2018-03-23 DIAGNOSIS — R1310 Dysphagia, unspecified: Secondary | ICD-10-CM | POA: Diagnosis not present

## 2018-03-23 DIAGNOSIS — M545 Low back pain: Secondary | ICD-10-CM | POA: Insufficient documentation

## 2018-03-23 DIAGNOSIS — R001 Bradycardia, unspecified: Secondary | ICD-10-CM | POA: Insufficient documentation

## 2018-03-23 DIAGNOSIS — G894 Chronic pain syndrome: Secondary | ICD-10-CM

## 2018-03-23 DIAGNOSIS — S14106S Unspecified injury at C6 level of cervical spinal cord, sequela: Secondary | ICD-10-CM

## 2018-03-23 DIAGNOSIS — S12500A Unspecified displaced fracture of sixth cervical vertebra, initial encounter for closed fracture: Secondary | ICD-10-CM | POA: Diagnosis not present

## 2018-03-23 DIAGNOSIS — Z5181 Encounter for therapeutic drug level monitoring: Secondary | ICD-10-CM

## 2018-03-23 DIAGNOSIS — R490 Dysphonia: Secondary | ICD-10-CM | POA: Diagnosis not present

## 2018-03-23 DIAGNOSIS — N319 Neuromuscular dysfunction of bladder, unspecified: Secondary | ICD-10-CM | POA: Insufficient documentation

## 2018-03-23 DIAGNOSIS — N390 Urinary tract infection, site not specified: Secondary | ICD-10-CM | POA: Diagnosis not present

## 2018-03-23 DIAGNOSIS — G8250 Quadriplegia, unspecified: Secondary | ICD-10-CM

## 2018-03-23 DIAGNOSIS — M25512 Pain in left shoulder: Secondary | ICD-10-CM | POA: Diagnosis not present

## 2018-03-23 DIAGNOSIS — R109 Unspecified abdominal pain: Secondary | ICD-10-CM | POA: Insufficient documentation

## 2018-03-23 DIAGNOSIS — G8929 Other chronic pain: Secondary | ICD-10-CM | POA: Insufficient documentation

## 2018-03-23 DIAGNOSIS — K592 Neurogenic bowel, not elsewhere classified: Secondary | ICD-10-CM | POA: Diagnosis not present

## 2018-03-23 DIAGNOSIS — G9589 Other specified diseases of spinal cord: Secondary | ICD-10-CM | POA: Diagnosis present

## 2018-03-23 DIAGNOSIS — Z87891 Personal history of nicotine dependence: Secondary | ICD-10-CM | POA: Diagnosis not present

## 2018-03-23 DIAGNOSIS — Z79891 Long term (current) use of opiate analgesic: Secondary | ICD-10-CM

## 2018-03-23 MED ORDER — GABAPENTIN 300 MG PO CAPS: 300.0000 mg | ORAL_CAPSULE | Freq: Three times a day (TID) | ORAL | 6 refills | Status: DC

## 2018-03-23 NOTE — Progress Notes (Signed)
Subjective:    Patient ID: Carl [name], male    DOB: 02/23/1951, 67 y.o.   MRN: [id number]  HPI: Carl [name] is a 67 y.o. male who returns for follow up appointment for chronic pain and medication refill. He states his pain is located in his mid- back He rates his pain 4. Also reports pain being controlled with the increase dose of Butrans and using Tramadol for breakthrough patient. His current exercise regime is walking.   [name] Morphine equivalent is 20.00 MME.  Last Oral Swab was Performed on 01/25/18, see note for details.   Pain Inventory Average Pain 5 Pain Right Now 4 My pain is sharp, dull and tingling  In the last 24 hours, has pain interfered with the following? General activity 5 Relation with others 1 Enjoyment of life 8 What TIME of day is your pain at its worst? morning, night  Sleep (in general) Fair  Pain is worse with: walking, bending, standing and some activites Pain improves with: medication Relief from Meds: 4  Mobility walk with assistance use a cane use a walker how many minutes can you walk? 30 ability to climb steps?  yes do you drive?  yes Do you have any goals in this area?  yes  Function retired I need assistance with the following:  household duties  Neuro/Psych weakness numbness tremor tingling trouble walking spasms  Prior Studies Any changes since last visit?  no  Physicians involved in your care Any changes since last visit?  no   Family History  Problem Relation Age of Onset  . Heart attack Father   . Alzheimer's disease Mother   . Aneurysm Brother        brain   Social History   Socioeconomic History  . Marital status: Married    Spouse name: Not on file  . Number of children: Not on file  . Years of education: Not on file  . Highest education level: Not on file  Occupational History  . Occupation: Education officer, environmental  Social Needs  . Financial resource strain: Not on file  . Food insecurity:    Worry: Not on  file    Inability: Not on file  . Transportation needs:    Medical: Not on file    Non-medical: Not on file  Tobacco Use  . Smoking status: Former Games developer  . Smokeless tobacco: Never Used  Substance and Sexual Activity  . Alcohol use: Not on file  . Drug use: Not on file  . Sexual activity: Not on file  Lifestyle  . Physical activity:    Days per week: Not on file    Minutes per session: Not on file  . Stress: Not on file  Relationships  . Social connections:    Talks on phone: Not on file    Gets together: Not on file    Attends religious service: Not on file    Active member of club or organization: Not on file    Attends meetings of clubs or organizations: Not on file    Relationship status: Not on file  Other Topics Concern  . Not on file  Social History Narrative  . Not on file   Past Surgical History:  Procedure Laterality Date  . ANTERIOR CERVICAL DECOMP/DISCECTOMY FUSION N/A 11/14/2014   Procedure: Cervical Five-Cervical Six  Anterior Cervical Decompression/Diskectomy/Fusion;  Surgeon: Maeola Harman, MD;  Location: [facility];  Service: Neurosurgery;  Laterality: N/A;   Past Medical History:  Diagnosis Date  .  Bradycardia   . Chronic abdominal pain   . Hesitancy of micturition    BP 104/69   Pulse (!) 54   Resp 15   Ht 5\' 8"  (1.727 m)   Wt 177 lb (80.3 kg)   SpO2 98%   BMI 26.91 kg/m   Opioid Risk Score:   Fall Risk Score:  `1  Depression screen PHQ 2/9  [facility] 07/20/2017 12/24/2015 01/09/2015  Decreased Interest 0 0 0  Down, Depressed, Hopeless 0 0 0  PHQ - 2 Score 0 0 0  Altered sleeping - - 0  Tired, decreased energy - - 1  Change in appetite - - 1  Feeling bad or failure about yourself  - - 0  Trouble concentrating - - 0  Moving slowly or fidgety/restless - - 0  Suicidal thoughts - - 0  PHQ-9 Score - - 2    Review of Systems  HENT: Negative.   Eyes: Negative.   Respiratory: Positive for shortness of breath.     Cardiovascular: Positive for leg swelling.  Gastrointestinal: Positive for abdominal pain.  Genitourinary: Positive for dysuria.  Musculoskeletal: Positive for back pain, gait problem and myalgias.       Spasms   Skin: Negative.   Allergic/Immunologic: Negative.   Neurological: Positive for tremors, weakness and numbness.       Tingling  Psychiatric/Behavioral: Negative.   All other systems reviewed and are negative.      Objective:   Physical Exam Vitals signs and nursing note reviewed.  Constitutional:      Appearance: Normal appearance.  Neck:     Musculoskeletal: Normal range of motion.  Cardiovascular:     Rate and Rhythm: Normal rate and regular rhythm.     Pulses: Normal pulses.     Heart sounds: Normal heart sounds.  Pulmonary:     Effort: Pulmonary effort is normal.     Breath sounds: Normal breath sounds.  Musculoskeletal:     Comments: Normal Muscle Bulk and Muscle Testing Reveals:  Upper Extremities: Full ROM and Muscle Strength 4/5 Thoracic Paraspinal Tenderness: T-7-T-9 Lumbar Paraspinal Tenderness: L-4-L-5 Lower Extremities: Full ROM and Muscle Strength 5/5 Arises from Table slowly Narrow Based  Gait   Skin:    General: Skin is warm and dry.  Neurological:     Mental Status: He is alert and oriented to person, place, and time.  Psychiatric:        Mood and Affect: Mood normal.        Behavior: Behavior normal.           Assessment & Plan:  1. C6 Spinal Cord Injury:/ . Spastic Tetraplegia: Continue Baclofen. Continue HEP as Tolerated and Continue to monitor. 03/23/2018 2. Neurogenic Bladder: Continue with I/O Caths Patient reports he performs  3 in and out  caths per day ranging 300-500 cc.   4. Neurogenic Bowel: Continue Bowel Program: 03/23/18 5. Chronic Pain Syndrome: Continue Gabapentin, Pamelor, Butrans  10910mcg/hr Patch weekly and Tramadol. 03/23/18 We will continue the opioid monitoring program, this consists of regular clinic visits,  examinations, urine drug screen, pill counts as well as use [place].  30 minutes of face to face patient care time was spent during this visit. All questions were encouraged and answered.  F/U in 2 months

## 2018-04-05 ENCOUNTER — Telehealth: Payer: Self-pay

## 2018-04-05 NOTE — Telephone Encounter (Signed)
I would like to help her, but Carl [name] is independent. If it were [place], I would do it. Can't justify for her.  i'm sorry

## 2018-04-05 NOTE — Telephone Encounter (Signed)
Pt wife of [name] Conly called requesting a letter for her to take to court house for jury duty. She states she can't leave him long, if she gets picked for a trial.

## 2018-04-06 NOTE — Telephone Encounter (Signed)
Patricia has been notified. 

## 2018-04-14 ENCOUNTER — Other Ambulatory Visit: Payer: Self-pay | Admitting: Physical Medicine & Rehabilitation

## 2018-05-12 ENCOUNTER — Other Ambulatory Visit: Payer: Self-pay | Admitting: Physical Medicine & Rehabilitation

## 2018-05-12 DIAGNOSIS — G894 Chronic pain syndrome: Secondary | ICD-10-CM

## 2018-05-12 DIAGNOSIS — S14106S Unspecified injury at C6 level of cervical spinal cord, sequela: Secondary | ICD-10-CM

## 2018-05-12 DIAGNOSIS — N319 Neuromuscular dysfunction of bladder, unspecified: Secondary | ICD-10-CM

## 2018-05-12 DIAGNOSIS — K592 Neurogenic bowel, not elsewhere classified: Secondary | ICD-10-CM

## 2018-05-12 DIAGNOSIS — G8250 Quadriplegia, unspecified: Secondary | ICD-10-CM

## 2018-05-18 ENCOUNTER — Other Ambulatory Visit: Payer: Self-pay | Admitting: Physical Medicine & Rehabilitation

## 2018-05-18 ENCOUNTER — Encounter: Payer: Self-pay | Admitting: Registered Nurse

## 2018-05-18 ENCOUNTER — Encounter: Payer: Medicare Other | Attending: Physical Medicine & Rehabilitation | Admitting: Registered Nurse

## 2018-05-18 VITALS — BP 113/73 | HR 50 | Resp 14 | Ht 68.0 in | Wt 176.0 lb

## 2018-05-18 DIAGNOSIS — G8929 Other chronic pain: Secondary | ICD-10-CM | POA: Diagnosis not present

## 2018-05-18 DIAGNOSIS — R1310 Dysphagia, unspecified: Secondary | ICD-10-CM | POA: Diagnosis not present

## 2018-05-18 DIAGNOSIS — M545 Low back pain: Secondary | ICD-10-CM | POA: Insufficient documentation

## 2018-05-18 DIAGNOSIS — Z87891 Personal history of nicotine dependence: Secondary | ICD-10-CM | POA: Insufficient documentation

## 2018-05-18 DIAGNOSIS — M792 Neuralgia and neuritis, unspecified: Secondary | ICD-10-CM

## 2018-05-18 DIAGNOSIS — G959 Disease of spinal cord, unspecified: Secondary | ICD-10-CM | POA: Insufficient documentation

## 2018-05-18 DIAGNOSIS — R490 Dysphonia: Secondary | ICD-10-CM | POA: Diagnosis not present

## 2018-05-18 DIAGNOSIS — N390 Urinary tract infection, site not specified: Secondary | ICD-10-CM | POA: Diagnosis not present

## 2018-05-18 DIAGNOSIS — S12500A Unspecified displaced fracture of sixth cervical vertebra, initial encounter for closed fracture: Secondary | ICD-10-CM | POA: Insufficient documentation

## 2018-05-18 DIAGNOSIS — Z5181 Encounter for therapeutic drug level monitoring: Secondary | ICD-10-CM

## 2018-05-18 DIAGNOSIS — Z79891 Long term (current) use of opiate analgesic: Secondary | ICD-10-CM | POA: Diagnosis not present

## 2018-05-18 DIAGNOSIS — G894 Chronic pain syndrome: Secondary | ICD-10-CM

## 2018-05-18 DIAGNOSIS — S14106S Unspecified injury at C6 level of cervical spinal cord, sequela: Secondary | ICD-10-CM

## 2018-05-18 DIAGNOSIS — G8250 Quadriplegia, unspecified: Secondary | ICD-10-CM | POA: Diagnosis not present

## 2018-05-18 DIAGNOSIS — M25512 Pain in left shoulder: Secondary | ICD-10-CM | POA: Insufficient documentation

## 2018-05-18 DIAGNOSIS — K592 Neurogenic bowel, not elsewhere classified: Secondary | ICD-10-CM

## 2018-05-18 DIAGNOSIS — N319 Neuromuscular dysfunction of bladder, unspecified: Secondary | ICD-10-CM | POA: Diagnosis not present

## 2018-05-18 DIAGNOSIS — R109 Unspecified abdominal pain: Secondary | ICD-10-CM | POA: Insufficient documentation

## 2018-05-18 DIAGNOSIS — G9589 Other specified diseases of spinal cord: Secondary | ICD-10-CM | POA: Diagnosis present

## 2018-05-18 DIAGNOSIS — R001 Bradycardia, unspecified: Secondary | ICD-10-CM | POA: Diagnosis not present

## 2018-05-18 MED ORDER — BUPRENORPHINE 10 MCG/HR TD PTWK: 1.0000 | MEDICATED_PATCH | TRANSDERMAL | 2 refills | Status: DC

## 2018-05-18 MED ORDER — TRAMADOL HCL 50 MG PO TABS: ORAL_TABLET | ORAL | 3 refills | Status: DC

## 2018-05-18 NOTE — Progress Notes (Signed)
Subjective:     Patient ID: Carl [name], male   DOB: 03/16/1951, 68 y.o.   MRN: [id number]  HPI: Carl [name] is a 68 y.o. male who returns for follow up appointment for chronic pain and medication refill. He states his pain is located in his left hand and lower back mainly right side. He rates his pain 4. His current exercise regime is walking  Carl [name] Morphine equivalent is 20.00 MME.  Last Oral Swab was Performed on 01/25/2018, see note for details. Oral Swab Performed today.   Pain Inventory Average Pain 4 Pain Right Now 4 My pain is sharp, stabbing, tingling and aching  In the last 24 hours, has pain interfered with the following? General activity 5 Relation with others 1 Enjoyment of life 8 What TIME of day is your pain at its worst? morning, evening Sleep (in general) Fair  Pain is worse with: bending, standing and some activites Pain improves with: rest and medication Relief from Meds: 4  Mobility walk with assistance use a cane use a walker how many minutes can you walk? 30 ability to climb steps?  yes do you drive?  yes Do you have any goals in this area?  yes  Function retired I need assistance with the following:  household duties and shopping  Neuro/Psych bowel control problems weakness numbness tremor tingling trouble walking spasms  Prior Studies Any changes since last visit?  no  Physicians involved in your care    Family History  Problem Relation Age of Onset  . Heart attack Father   . Alzheimer's disease Mother   . Aneurysm Brother        brain   Social History   Socioeconomic History  . Marital status: Married    Spouse name: Not on file  . Number of children: Not on file  . Years of education: Not on file  . Highest education level: Not on file  Occupational History  . Occupation: Education officer, environmental  Social Needs  . Financial resource strain: Not on file  . Food insecurity:    Worry: Not on file    Inability: Not on file  .  Transportation needs:    Medical: Not on file    Non-medical: Not on file  Tobacco Use  . Smoking status: Former Games developer  . Smokeless tobacco: Never Used  Substance and Sexual Activity  . Alcohol use: Not on file  . Drug use: Not on file  . Sexual activity: Not on file  Lifestyle  . Physical activity:    Days per week: Not on file    Minutes per session: Not on file  . Stress: Not on file  Relationships  . Social connections:    Talks on phone: Not on file    Gets together: Not on file    Attends religious service: Not on file    Active member of club or organization: Not on file    Attends meetings of clubs or organizations: Not on file    Relationship status: Not on file  Other Topics Concern  . Not on file  Social History Narrative  . Not on file   Past Surgical History:  Procedure Laterality Date  . ANTERIOR CERVICAL DECOMP/DISCECTOMY FUSION N/A 11/14/2014   Procedure: Cervical Five-Cervical Six  Anterior Cervical Decompression/Diskectomy/Fusion;  Surgeon: Maeola Harman, MD;  Location: [facility];  Service: Neurosurgery;  Laterality: N/A;   Past Medical History:  Diagnosis Date  . Bradycardia   . Chronic abdominal pain   .  Hesitancy of micturition    BP 113/73   Pulse (!) 44   Resp 14   Ht 5\' 8"  (1.727 m)   Wt 176 lb (79.8 kg)   SpO2 96%   BMI 26.76 kg/m   Opioid Risk Score:   Fall Risk Score:  `1  Depression screen PHQ 2/9  [facility] 07/20/2017 12/24/2015 01/09/2015  Decreased Interest 0 0 0  Down, Depressed, Hopeless 0 0 0  PHQ - 2 Score 0 0 0  Altered sleeping - - 0  Tired, decreased energy - - 1  Change in appetite - - 1  Feeling bad or failure about yourself  - - 0  Trouble concentrating - - 0  Moving slowly or fidgety/restless - - 0  Suicidal thoughts - - 0  PHQ-9 Score - - 2    Review of Systems  Constitutional: Negative.   HENT: Negative.   Eyes: Negative.   Respiratory: Positive for shortness of breath.   Cardiovascular:  Positive for leg swelling.  Gastrointestinal: Positive for abdominal pain.  Endocrine: Negative.   Genitourinary: Negative.   Musculoskeletal: Positive for gait problem.       Spasms   Skin: Negative.   Allergic/Immunologic: Negative.   Neurological: Positive for tremors, weakness and numbness.       Tingling  Hematological: Negative.   Psychiatric/Behavioral: Negative.   All other systems reviewed and are negative.      Objective:   Physical Exam Vitals signs and nursing note reviewed.  Constitutional:      Appearance: Normal appearance.  Neck:     Musculoskeletal: Normal range of motion and neck supple.  Cardiovascular:     Rate and Rhythm: Normal rate and regular rhythm.     Pulses: Normal pulses.     Heart sounds: Normal heart sounds.  Pulmonary:     Effort: Pulmonary effort is normal.     Breath sounds: Normal breath sounds.  Musculoskeletal:     Comments: Normal Muscle Bulk and Muscle Testing Reveals:  Upper Extremities: Full  ROM and Muscle Strength 5/5  Back without spinal tenderness noted  Lower Extremities: Right: Full ROM and Muscle Strength 5/5  Left: Decreased ROM and Muscle Strength 5/5 Left lower extremity flexion produces pain into left lower extremity Arises from chair with ease using cane for support Narrow Based Gait   Skin:    General: Skin is warm and dry.  Neurological:     Mental Status: He is alert and oriented to person, place, and time.  Psychiatric:        Mood and Affect: Mood normal.        Behavior: Behavior normal.        Assessment: PLan  1. C6 Spinal Cord Injury:/ . Spastic Tetraplegia:  Continue HEP as Tolerated and Continue to monitor. 05/18/2018 2. Neurogenic Bladder: Continue with I/O Caths Patient reports he performs 3 in and out caths per day ranging 300-500 cc.   3. Neurogenic Bowel: Continue Bowel Program: 05/18/2018 4. Chronic Pain Syndrome: Continue Gabapentin, Pamelor, Butrans 5110mcg/hr Patch weekly and Tramadol.  05/18/2018 We will continue the opioid monitoring program, this consists of regular clinic visits, examinations, urine drug screen, pill counts as well as use [place].  30minutes of face to face patient care time was spent during this visit. All questions were encouraged and answered.  F/U in 2 months

## 2018-05-22 LAB — DRUG TOX MONITOR 1 W/CONF, ORAL FLD
Amphetamines: NEGATIVE ng/mL (ref <10)
BUPRENORPHINE: NEGATIVE ng/mL (ref <0.10)
Barbiturates: NEGATIVE ng/mL (ref <10)
Benzodiazepines: NEGATIVE ng/mL (ref <0.50)
COCAINE: NEGATIVE ng/mL (ref <5.0)
Fentanyl: NEGATIVE ng/mL (ref <0.10)
HEROIN METABOLITE: NEGATIVE ng/mL (ref <1.0)
MARIJUANA: NEGATIVE ng/mL (ref <2.5)
MDMA: NEGATIVE ng/mL (ref <10)
MEPROBAMATE: NEGATIVE ng/mL (ref <2.5)
METHADONE: NEGATIVE ng/mL (ref <5.0)
Nicotine Metabolite: NEGATIVE ng/mL (ref <5.0)
Opiates: NEGATIVE ng/mL (ref <2.5)
Phencyclidine: NEGATIVE ng/mL (ref <10)
TAPENTADOL: NEGATIVE ng/mL (ref <5.0)
Tramadol: >500 ng/mL — ABNORMAL HIGH (ref <5.0)
Tramadol: POSITIVE ng/mL — AB (ref <5.0)
ZOLPIDEM: NEGATIVE ng/mL (ref <5.0)

## 2018-05-22 LAB — DRUG TOX ALC METAB W/CON, ORAL FLD: Alcohol Metabolite: NEGATIVE ng/mL (ref <25)

## 2018-05-27 ENCOUNTER — Telehealth: Payer: Self-pay | Admitting: *Deleted

## 2018-05-27 NOTE — Telephone Encounter (Signed)
Oral swab drug screen was consistent for prescribed medications. Butrans did not show up but it is hard to detect and depends on when patch is applied. The application date was not addressed.

## 2018-06-14 ENCOUNTER — Other Ambulatory Visit: Payer: Self-pay | Admitting: Physical Medicine & Rehabilitation

## 2018-06-14 DIAGNOSIS — N319 Neuromuscular dysfunction of bladder, unspecified: Secondary | ICD-10-CM

## 2018-06-14 DIAGNOSIS — G8250 Quadriplegia, unspecified: Secondary | ICD-10-CM

## 2018-06-14 DIAGNOSIS — G894 Chronic pain syndrome: Secondary | ICD-10-CM

## 2018-06-14 DIAGNOSIS — K592 Neurogenic bowel, not elsewhere classified: Secondary | ICD-10-CM

## 2018-06-14 DIAGNOSIS — S14106S Unspecified injury at C6 level of cervical spinal cord, sequela: Secondary | ICD-10-CM

## 2018-07-13 ENCOUNTER — Other Ambulatory Visit: Payer: Self-pay | Admitting: Physical Medicine & Rehabilitation

## 2018-08-11 ENCOUNTER — Encounter: Payer: Self-pay | Admitting: Physical Medicine & Rehabilitation

## 2018-08-11 ENCOUNTER — Encounter: Payer: Medicare Other | Attending: Physical Medicine & Rehabilitation | Admitting: Physical Medicine & Rehabilitation

## 2018-08-11 ENCOUNTER — Other Ambulatory Visit: Payer: Self-pay

## 2018-08-11 VITALS — BP 108/71 | HR 46 | Ht 68.0 in | Wt 185.0 lb

## 2018-08-11 DIAGNOSIS — G8929 Other chronic pain: Secondary | ICD-10-CM | POA: Insufficient documentation

## 2018-08-11 DIAGNOSIS — N390 Urinary tract infection, site not specified: Secondary | ICD-10-CM | POA: Insufficient documentation

## 2018-08-11 DIAGNOSIS — M545 Low back pain: Secondary | ICD-10-CM | POA: Insufficient documentation

## 2018-08-11 DIAGNOSIS — Z87891 Personal history of nicotine dependence: Secondary | ICD-10-CM | POA: Insufficient documentation

## 2018-08-11 DIAGNOSIS — M792 Neuralgia and neuritis, unspecified: Secondary | ICD-10-CM | POA: Diagnosis not present

## 2018-08-11 DIAGNOSIS — G894 Chronic pain syndrome: Secondary | ICD-10-CM | POA: Diagnosis not present

## 2018-08-11 DIAGNOSIS — N319 Neuromuscular dysfunction of bladder, unspecified: Secondary | ICD-10-CM | POA: Insufficient documentation

## 2018-08-11 DIAGNOSIS — G959 Disease of spinal cord, unspecified: Secondary | ICD-10-CM | POA: Insufficient documentation

## 2018-08-11 DIAGNOSIS — R1310 Dysphagia, unspecified: Secondary | ICD-10-CM | POA: Insufficient documentation

## 2018-08-11 DIAGNOSIS — G8250 Quadriplegia, unspecified: Secondary | ICD-10-CM | POA: Insufficient documentation

## 2018-08-11 DIAGNOSIS — S12500A Unspecified displaced fracture of sixth cervical vertebra, initial encounter for closed fracture: Secondary | ICD-10-CM | POA: Insufficient documentation

## 2018-08-11 DIAGNOSIS — R001 Bradycardia, unspecified: Secondary | ICD-10-CM | POA: Insufficient documentation

## 2018-08-11 DIAGNOSIS — R490 Dysphonia: Secondary | ICD-10-CM | POA: Insufficient documentation

## 2018-08-11 DIAGNOSIS — R109 Unspecified abdominal pain: Secondary | ICD-10-CM | POA: Insufficient documentation

## 2018-08-11 DIAGNOSIS — M25512 Pain in left shoulder: Secondary | ICD-10-CM | POA: Insufficient documentation

## 2018-08-11 MED ORDER — BUPRENORPHINE 15 MCG/HR TD PTWK: 15.0000 ug | MEDICATED_PATCH | TRANSDERMAL | 4 refills | Status: DC

## 2018-08-11 NOTE — Progress Notes (Signed)
Subjective:    Patient ID: Carl [name], male    DOB: March 24, 1951, 68 y.o.   MRN: [id number]  HPI   Due to national recommendations of social distancing because of COVID 2619, an audio/video tele-health visit is felt to be the most appropriate encounter for this patient at this time. See MyChart message from today for the patient's consent to a tele-health encounter with [facility]. This is a follow up telephone visit for the patient who is at home. MD is at office.    I am meeting with the patient today regarding his chronic pain and cervical myelopathy. His pain levels have been reasonable. He walks and exercises daily. He has been down at [place] with his son n law and has been fishing a good bit. He has relaxed a lot too. He feels that he knows his limits and knows when to back off an activity. He feels that the butrans patch has helped his pain (scores are lower) but he states it hasn't made a "major" difference. He's tolerating it wihtout issues. He remains on gabapentin, cymbalta, and tramadol as well.,   Pain Inventory Average Pain 4 Pain Right Now 1 My pain is constant, sharp, burning, dull, stabbing, tingling and aching  In the last 24 hours, has pain interfered with the following? General activity 2 Relation with others 2 Enjoyment of life 2 What TIME of day is your pain at its worst? evening Sleep (in general) Fair  Pain is worse with: walking and standing Pain improves with: rest and medication Relief from Meds: 3  Mobility walk with assistance use a cane how many minutes can you walk? 30 ability to climb steps?  yes do you drive?  yes  Function retired  Neuro/Psych bladder control problems numbness tremor tingling trouble walking  Prior Studies Any changes since last visit?  no  Physicians involved in your care Any changes since last visit?  no   Family History  Problem Relation Age of Onset  . Heart attack Father    . Alzheimer's disease Mother   . Aneurysm Brother        brain   Social History   Socioeconomic History  . Marital status: Married    Spouse name: Not on file  . Number of children: Not on file  . Years of education: Not on file  . Highest education level: Not on file  Occupational History  . Occupation: Education officer, environmentalastor  Social Needs  . Financial resource strain: Not on file  . Food insecurity:    Worry: Not on file    Inability: Not on file  . Transportation needs:    Medical: Not on file    Non-medical: Not on file  Tobacco Use  . Smoking status: Former Games developermoker  . Smokeless tobacco: Never Used  Substance and Sexual Activity  . Alcohol use: Not on file  . Drug use: Not on file  . Sexual activity: Not on file  Lifestyle  . Physical activity:    Days per week: Not on file    Minutes per session: Not on file  . Stress: Not on file  Relationships  . Social connections:    Talks on phone: Not on file    Gets together: Not on file    Attends religious service: Not on file    Active member of club or organization: Not on file    Attends meetings of clubs or organizations: Not on file    Relationship  status: Not on file  Other Topics Concern  . Not on file  Social History Narrative  . Not on file   Past Surgical History:  Procedure Laterality Date  . ANTERIOR CERVICAL DECOMP/DISCECTOMY FUSION N/A 11/14/2014   Procedure: Cervical Five-Cervical Six  Anterior Cervical Decompression/Diskectomy/Fusion;  Surgeon: Maeola Harman, MD;  Location: [facility];  Service: Neurosurgery;  Laterality: N/A;   Past Medical History:  Diagnosis Date  . Bradycardia   . Chronic abdominal pain   . Hesitancy of micturition    BP 108/71 Comment: pt reported, virtual visit  Pulse (!) 46 Comment: pt reported, virtual visit  Ht  (1.727 m)   Wt 185 lb (83.9 kg)   BMI 28.13 kg/m   Opioid Risk Score:   Fall Risk Score:  `1  Depression screen PHQ 2/9  Depression screen [facility] 2/9 08/11/2018  07/20/2017 12/24/2015 01/09/2015  Decreased Interest 0 0 0 0  Down, Depressed, Hopeless 0 0 0 0  PHQ - 2 Score 0 0 0 0  Altered sleeping - - - 0  Tired, decreased energy - - - 1  Change in appetite - - - 1  Feeling bad or failure about yourself  - - - 0  Trouble concentrating - - - 0  Moving slowly or fidgety/restless - - - 0  Suicidal thoughts - - - 0  PHQ-9 Score - - - 2    Review of Systems  Constitutional: Negative.   HENT: Negative.   Eyes: Negative.   Respiratory: Negative.   Cardiovascular: Negative.   Gastrointestinal: Negative.   Endocrine: Negative.   Genitourinary: Negative.   Musculoskeletal: Positive for back pain.       Arm pain  Hand pain  Skin: Negative.   Allergic/Immunologic: Negative.   Neurological: Positive for tremors and numbness. Negative for headaches.       Tingling  Psychiatric/Behavioral: Negative.   All other systems reviewed and are negative.          Assessment & Plan:  1. Functional deficits secondary to C6 ASIA C incomplete tetraplegia, spinal cord injury due to fall with C5-C6 fracture and neurogenic bowel and bladder  - continuewith HEP.needs to know limits  2. For spasms: continue baclofen ( ) at bedtime.  3. Pain Management: gabapentin continue at  TID--refilled today -Continue Pamelor 25 mg nightly - tramadol 50 mg q6 prn #420 no RF needed today -We will continue the controlled substance monitoring program, this consists of regular clinic visits, examinations, routine drug screening, pill counts as well as use of [place] Controlled Substance Reporting System. [place] today.  .  -increase buprenorphine patch to 15 mcg per week, #4 with 3 refills 4. Mood: definitely playing a rolealthough he denies -continue cymbalta.  5. Neurogenic bladder---I/O caths- ?coude tip cath #16 -follow up with urology. -300-500 cc caths             -UTI?---ua and  ucx---will go to [facility] later this week to submit 6. Neurogenic bowel: senna in pm and fleet enema qam. -sorbitol, miralax for rescue may continue  -may need to be more aggressive with increased butrans 7. Chronic bradycardia---baseline around 40.asymptomatic 8.Dysphagia:MBS with oro-pharyngeal delay and decreased laryngeal elevation. delaye UES closure also with weak respiratory muscle functioning during speech, cough, etc -improved for the most part              -need to be aware of diet choices, positioning, rate of feeding, etc  11 minutes of tele-visit  time was spent with this patient today. Follow up in 3 months with NP

## 2018-08-13 ENCOUNTER — Other Ambulatory Visit: Payer: Self-pay | Admitting: Physical Medicine & Rehabilitation

## 2018-08-13 DIAGNOSIS — F329 Major depressive disorder, single episode, unspecified: Secondary | ICD-10-CM

## 2018-09-14 ENCOUNTER — Telehealth: Payer: Self-pay | Admitting: *Deleted

## 2018-09-14 DIAGNOSIS — M792 Neuralgia and neuritis, unspecified: Secondary | ICD-10-CM

## 2018-09-14 DIAGNOSIS — G894 Chronic pain syndrome: Secondary | ICD-10-CM

## 2018-09-14 DIAGNOSIS — G8250 Quadriplegia, unspecified: Secondary | ICD-10-CM

## 2018-09-14 MED ORDER — BUPRENORPHINE 15 MCG/HR TD PTWK: 15.0000 ug | MEDICATED_PATCH | TRANSDERMAL | 4 refills | Status: DC

## 2018-09-14 NOTE — Telephone Encounter (Signed)
ok 

## 2018-09-14 NOTE — Telephone Encounter (Signed)
Carl [name] called to ask for Carl [name]'s buprenorphine be sent to the [facility] this once.  I have switched the pharmacy to the one they are requesting.

## 2018-09-15 NOTE — Telephone Encounter (Signed)
Notified. 

## 2018-10-02 ENCOUNTER — Other Ambulatory Visit: Payer: Self-pay | Admitting: Physical Medicine & Rehabilitation

## 2018-10-02 DIAGNOSIS — F329 Major depressive disorder, single episode, unspecified: Secondary | ICD-10-CM

## 2018-10-12 ENCOUNTER — Other Ambulatory Visit: Payer: Self-pay | Admitting: Physical Medicine & Rehabilitation

## 2018-10-12 DIAGNOSIS — K592 Neurogenic bowel, not elsewhere classified: Secondary | ICD-10-CM

## 2018-10-12 DIAGNOSIS — S14106S Unspecified injury at C6 level of cervical spinal cord, sequela: Secondary | ICD-10-CM

## 2018-10-12 DIAGNOSIS — G894 Chronic pain syndrome: Secondary | ICD-10-CM

## 2018-10-12 DIAGNOSIS — N319 Neuromuscular dysfunction of bladder, unspecified: Secondary | ICD-10-CM

## 2018-10-12 DIAGNOSIS — G8250 Quadriplegia, unspecified: Secondary | ICD-10-CM

## 2018-10-14 ENCOUNTER — Other Ambulatory Visit: Payer: Self-pay | Admitting: Physical Medicine & Rehabilitation

## 2018-10-14 ENCOUNTER — Other Ambulatory Visit: Payer: Self-pay | Admitting: Registered Nurse

## 2018-10-14 DIAGNOSIS — F329 Major depressive disorder, single episode, unspecified: Secondary | ICD-10-CM

## 2018-11-11 ENCOUNTER — Encounter: Payer: Self-pay | Admitting: Registered Nurse

## 2018-11-11 ENCOUNTER — Encounter: Payer: Medicare Other | Attending: Physical Medicine & Rehabilitation | Admitting: Registered Nurse

## 2018-11-11 ENCOUNTER — Other Ambulatory Visit: Payer: Self-pay

## 2018-11-11 VITALS — BP 109/69 | HR 64 | Temp 97.7°F | Resp 14 | Ht 68.0 in | Wt 179.8 lb

## 2018-11-11 DIAGNOSIS — R490 Dysphonia: Secondary | ICD-10-CM | POA: Diagnosis not present

## 2018-11-11 DIAGNOSIS — M25512 Pain in left shoulder: Secondary | ICD-10-CM | POA: Insufficient documentation

## 2018-11-11 DIAGNOSIS — G8929 Other chronic pain: Secondary | ICD-10-CM | POA: Insufficient documentation

## 2018-11-11 DIAGNOSIS — G894 Chronic pain syndrome: Secondary | ICD-10-CM

## 2018-11-11 DIAGNOSIS — R1310 Dysphagia, unspecified: Secondary | ICD-10-CM | POA: Insufficient documentation

## 2018-11-11 DIAGNOSIS — G9589 Other specified diseases of spinal cord: Secondary | ICD-10-CM | POA: Diagnosis present

## 2018-11-11 DIAGNOSIS — R109 Unspecified abdominal pain: Secondary | ICD-10-CM | POA: Insufficient documentation

## 2018-11-11 DIAGNOSIS — Z87891 Personal history of nicotine dependence: Secondary | ICD-10-CM | POA: Insufficient documentation

## 2018-11-11 DIAGNOSIS — N390 Urinary tract infection, site not specified: Secondary | ICD-10-CM | POA: Insufficient documentation

## 2018-11-11 DIAGNOSIS — N319 Neuromuscular dysfunction of bladder, unspecified: Secondary | ICD-10-CM | POA: Diagnosis not present

## 2018-11-11 DIAGNOSIS — M792 Neuralgia and neuritis, unspecified: Secondary | ICD-10-CM

## 2018-11-11 DIAGNOSIS — Z79891 Long term (current) use of opiate analgesic: Secondary | ICD-10-CM

## 2018-11-11 DIAGNOSIS — S14106S Unspecified injury at C6 level of cervical spinal cord, sequela: Secondary | ICD-10-CM | POA: Diagnosis not present

## 2018-11-11 DIAGNOSIS — G959 Disease of spinal cord, unspecified: Secondary | ICD-10-CM | POA: Insufficient documentation

## 2018-11-11 DIAGNOSIS — G8250 Quadriplegia, unspecified: Secondary | ICD-10-CM | POA: Diagnosis not present

## 2018-11-11 DIAGNOSIS — R001 Bradycardia, unspecified: Secondary | ICD-10-CM | POA: Insufficient documentation

## 2018-11-11 DIAGNOSIS — S12500A Unspecified displaced fracture of sixth cervical vertebra, initial encounter for closed fracture: Secondary | ICD-10-CM | POA: Insufficient documentation

## 2018-11-11 DIAGNOSIS — F329 Major depressive disorder, single episode, unspecified: Secondary | ICD-10-CM

## 2018-11-11 DIAGNOSIS — Z5181 Encounter for therapeutic drug level monitoring: Secondary | ICD-10-CM

## 2018-11-11 DIAGNOSIS — K592 Neurogenic bowel, not elsewhere classified: Secondary | ICD-10-CM

## 2018-11-11 DIAGNOSIS — M545 Low back pain: Secondary | ICD-10-CM | POA: Insufficient documentation

## 2018-11-11 MED ORDER — TRAMADOL HCL 50 MG PO TABS: 50.0000 mg | ORAL_TABLET | Freq: Four times a day (QID) | ORAL | 2 refills | Status: DC | PRN

## 2018-11-11 NOTE — Progress Notes (Signed)
Subjective:    Patient ID: Carl [name], male    DOB: 07-29-1950, 68 y.o.   MRN: [id number]  HPI: Carl [name] is a 68 y.o. male who returns for follow up appointment for chronic pain and medication refill. He states his pain is located in his left arm, left hand numbness and lower back pain. He rates his  Pain 5.  His current exercise regime is walking and performing light yard work.  Carl [name] Morphine equivalent is 20.00  MME. Last  Oral Swab was performed on 05/18/2018, it was consistent.   Pain Inventory Average Pain 4 Pain Right Now 4 My pain is dull, tingling and aching  In the last 24 hours, has pain interfered with the following? General activity 5 Relation with others 2 Enjoyment of life 8 What TIME of day is your pain at its worst? morning and evening Sleep (in general) Fair  Pain is worse with: bending and some activites Pain improves with: rest and medication Relief from Meds: 4  Mobility walk with assistance use a cane how many minutes can you walk? 30 ability to climb steps?  yes do you drive?  yes  Function retired  Neuro/Psych weakness numbness tremor tingling trouble walking spasms  Prior Studies Any changes since last visit?  no  Physicians involved in your care Any changes since last visit?  no   Family History  Problem Relation Age of Onset   Heart attack Father    Alzheimer's disease Mother    Aneurysm Brother        brain   Social History   Socioeconomic History   Marital status: Married    Spouse name: Not on file   Number of children: Not on file   Years of education: Not on file   Highest education level: Not on file  Occupational History   Occupation: Charity fundraiserastor  Social Needs   Financial resource strain: Not on file   Food insecurity    Worry: Not on file    Inability: Not on file   Transportation needs    Medical: Not on file    Non-medical: Not on file  Tobacco Use   Smoking status: Former Smoker    Smokeless tobacco: Never Used  Substance and Sexual Activity   Alcohol use: Not on file   Drug use: Not on file   Sexual activity: Not on file  Lifestyle   Physical activity    Days per week: Not on file    Minutes per session: Not on file   Stress: Not on file  Relationships   Social connections    Talks on phone: Not on file    Gets together: Not on file    Attends religious service: Not on file    Active member of club or organization: Not on file    Attends meetings of clubs or organizations: Not on file    Relationship status: Not on file  Other Topics Concern   Not on file  Social History Narrative   Not on file   Past Surgical History:  Procedure Laterality Date   ANTERIOR CERVICAL DECOMP/DISCECTOMY FUSION N/A 11/14/2014   Procedure: Cervical Five-Cervical Six  Anterior Cervical Decompression/Diskectomy/Fusion;  Surgeon: Maeola HarmanJoseph Stern, MD;  Location: [facility];  Service: Neurosurgery;  Laterality: N/A;   Past Medical History:  Diagnosis Date   Bradycardia    Chronic abdominal pain    Hesitancy of micturition    There were no vitals taken for this visit.  Opioid Risk Score:   Fall Risk Score:  `1  Depression screen PHQ 2/9  Depression screen [facility] 2/9 08/11/2018 07/20/2017 12/24/2015 01/09/2015  Decreased Interest 0 0 0 0  Down, Depressed, Hopeless 0 0 0 0  PHQ - 2 Score 0 0 0 0  Altered sleeping - - - 0  Tired, decreased energy - - - 1  Change in appetite - - - 1  Feeling bad or failure about yourself  - - - 0  Trouble concentrating - - - 0  Moving slowly or fidgety/restless - - - 0  Suicidal thoughts - - - 0  PHQ-9 Score - - - 2     Review of Systems  HENT: Negative.   Eyes: Negative.   Respiratory: Positive for shortness of breath.   Cardiovascular: Positive for leg swelling.  Gastrointestinal: Positive for abdominal pain.  Endocrine: Negative.   Genitourinary: Negative.   Musculoskeletal: Positive for back pain, gait problem and myalgias.   Skin: Negative.   Allergic/Immunologic: Negative.   Neurological: Positive for weakness and numbness.  Psychiatric/Behavioral: Negative.   All other systems reviewed and are negative.      Objective:   Physical Exam Vitals signs and nursing note reviewed.  Constitutional:      Appearance: Normal appearance.  Neck:     Musculoskeletal: Normal range of motion and neck supple.  Cardiovascular:     Rate and Rhythm: Normal rate and regular rhythm.     Pulses: Normal pulses.     Heart sounds: Normal heart sounds.  Pulmonary:     Effort: Pulmonary effort is normal.     Breath sounds: Normal breath sounds.  Musculoskeletal:     Comments: Normal Muscle Bulk and Muscle Testing Reveals:  Upper Extremities: Full ROM and Muscle Strength 4/5 Lumbar Paraspinal Tenderness: L-4-L-5 Lower Extremities: Full ROM and Muscle Strength 5/5 Arises from chair with ease Narrow Based Gait   Skin:    General: Skin is warm and dry.  Neurological:     Mental Status: He is alert and oriented to person, place, and time.  Psychiatric:        Mood and Affect: Mood normal.        Behavior: Behavior normal.           Assessment & Plan:  1. C6 Spinal Cord Injury:/ . Spastic Tetraplegia:  Continue HEP as Tolerated and Continue to monitor.11/11/2018 2. Neurogenic Bladder: Continue with I/O Caths Patient reports he performs 3 in and out caths per day ranging200-500 cc.  3. Neurogenic Bowel: Continue Bowel Program:11/11/2018 4. Chronic Pain Syndrome: Continue Gabapentin, Pamelor, Butrans12mcg/hr Patch weekly and Tramadol.11/11/2018 We will continue the opioid monitoring program, this consists of regular clinic visits, examinations, urine drug screen, pill counts as well as use of [place] Controlled Substance Reporting system.  83minutes of face to face patient care time was spent during this visit. All questions were encouraged and answered.  F/U in17months

## 2018-11-26 ENCOUNTER — Other Ambulatory Visit: Payer: Self-pay | Admitting: Registered Nurse

## 2018-11-26 DIAGNOSIS — G894 Chronic pain syndrome: Secondary | ICD-10-CM

## 2018-11-26 DIAGNOSIS — K592 Neurogenic bowel, not elsewhere classified: Secondary | ICD-10-CM

## 2018-11-26 DIAGNOSIS — G8250 Quadriplegia, unspecified: Secondary | ICD-10-CM

## 2018-11-26 DIAGNOSIS — N319 Neuromuscular dysfunction of bladder, unspecified: Secondary | ICD-10-CM

## 2018-11-26 DIAGNOSIS — S14106S Unspecified injury at C6 level of cervical spinal cord, sequela: Secondary | ICD-10-CM

## 2019-01-11 ENCOUNTER — Other Ambulatory Visit: Payer: Self-pay | Admitting: Physical Medicine & Rehabilitation

## 2019-01-26 ENCOUNTER — Other Ambulatory Visit: Payer: Self-pay | Admitting: Physical Medicine & Rehabilitation

## 2019-01-26 ENCOUNTER — Other Ambulatory Visit: Payer: Self-pay | Admitting: Registered Nurse

## 2019-01-26 DIAGNOSIS — G894 Chronic pain syndrome: Secondary | ICD-10-CM

## 2019-01-26 DIAGNOSIS — G8250 Quadriplegia, unspecified: Secondary | ICD-10-CM

## 2019-01-26 DIAGNOSIS — N319 Neuromuscular dysfunction of bladder, unspecified: Secondary | ICD-10-CM

## 2019-01-26 DIAGNOSIS — K592 Neurogenic bowel, not elsewhere classified: Secondary | ICD-10-CM

## 2019-01-26 DIAGNOSIS — S14106S Unspecified injury at C6 level of cervical spinal cord, sequela: Secondary | ICD-10-CM

## 2019-01-26 DIAGNOSIS — M792 Neuralgia and neuritis, unspecified: Secondary | ICD-10-CM

## 2019-02-10 ENCOUNTER — Encounter: Payer: Medicare Other | Attending: Physical Medicine & Rehabilitation | Admitting: Registered Nurse

## 2019-02-10 ENCOUNTER — Encounter: Payer: Self-pay | Admitting: Registered Nurse

## 2019-02-10 ENCOUNTER — Other Ambulatory Visit: Payer: Self-pay

## 2019-02-10 VITALS — BP 106/71 | HR 78 | Temp 97.7°F | Ht 69.0 in | Wt 173.0 lb

## 2019-02-10 DIAGNOSIS — F329 Major depressive disorder, single episode, unspecified: Secondary | ICD-10-CM

## 2019-02-10 DIAGNOSIS — N319 Neuromuscular dysfunction of bladder, unspecified: Secondary | ICD-10-CM | POA: Insufficient documentation

## 2019-02-10 DIAGNOSIS — G8929 Other chronic pain: Secondary | ICD-10-CM | POA: Insufficient documentation

## 2019-02-10 DIAGNOSIS — R109 Unspecified abdominal pain: Secondary | ICD-10-CM | POA: Diagnosis not present

## 2019-02-10 DIAGNOSIS — S12500A Unspecified displaced fracture of sixth cervical vertebra, initial encounter for closed fracture: Secondary | ICD-10-CM | POA: Diagnosis not present

## 2019-02-10 DIAGNOSIS — K592 Neurogenic bowel, not elsewhere classified: Secondary | ICD-10-CM

## 2019-02-10 DIAGNOSIS — M545 Low back pain: Secondary | ICD-10-CM | POA: Diagnosis not present

## 2019-02-10 DIAGNOSIS — Z87891 Personal history of nicotine dependence: Secondary | ICD-10-CM | POA: Diagnosis not present

## 2019-02-10 DIAGNOSIS — M25512 Pain in left shoulder: Secondary | ICD-10-CM | POA: Insufficient documentation

## 2019-02-10 DIAGNOSIS — R001 Bradycardia, unspecified: Secondary | ICD-10-CM | POA: Diagnosis not present

## 2019-02-10 DIAGNOSIS — M792 Neuralgia and neuritis, unspecified: Secondary | ICD-10-CM

## 2019-02-10 DIAGNOSIS — Z5181 Encounter for therapeutic drug level monitoring: Secondary | ICD-10-CM | POA: Diagnosis present

## 2019-02-10 DIAGNOSIS — R1310 Dysphagia, unspecified: Secondary | ICD-10-CM | POA: Insufficient documentation

## 2019-02-10 DIAGNOSIS — R490 Dysphonia: Secondary | ICD-10-CM | POA: Insufficient documentation

## 2019-02-10 DIAGNOSIS — G894 Chronic pain syndrome: Secondary | ICD-10-CM

## 2019-02-10 DIAGNOSIS — N390 Urinary tract infection, site not specified: Secondary | ICD-10-CM | POA: Insufficient documentation

## 2019-02-10 DIAGNOSIS — G8250 Quadriplegia, unspecified: Secondary | ICD-10-CM | POA: Insufficient documentation

## 2019-02-10 DIAGNOSIS — G9589 Other specified diseases of spinal cord: Secondary | ICD-10-CM | POA: Diagnosis present

## 2019-02-10 DIAGNOSIS — G959 Disease of spinal cord, unspecified: Secondary | ICD-10-CM | POA: Diagnosis not present

## 2019-02-10 DIAGNOSIS — S14106S Unspecified injury at C6 level of cervical spinal cord, sequela: Secondary | ICD-10-CM | POA: Diagnosis not present

## 2019-02-10 DIAGNOSIS — Z79891 Long term (current) use of opiate analgesic: Secondary | ICD-10-CM | POA: Insufficient documentation

## 2019-02-10 NOTE — Progress Notes (Signed)
Subjective:    Patient ID: Carl [name], male    DOB: 17-Aug-1950, 68 y.o.   MRN: [id number]  HPI: Carl [name] is a 68 y.o. male who returns for follow up appointment for chronic pain and medication refill. He states his  pain is located in his left hand with tingling and burning, mid- lower back pain and bilateral feet with numbness and tingling. He rates his pain 4. His current exercise regime is walking.   Mr. Persinger Morphine equivalent is 20.00 MME. UDS ordered today.  Pain Inventory Average Pain 4 Pain Right Now 4 My pain is dull, stabbing, tingling and aching  In the last 24 hours, has pain interfered with the following? General activity 5 Relation with others 3 Enjoyment of life 7 What TIME of day is your pain at its worst? evening Sleep (in general) Fair  Pain is worse with: walking, bending, standing and some activites Pain improves with: rest and medication Relief from Meds: 3  Mobility use a cane ability to climb steps?  yes do you drive?  yes  Function retired I need assistance with the following:  meal prep, household duties and shopping  Neuro/Psych weakness numbness tremor tingling trouble walking spasms  Prior Studies Any changes since last visit?  no  Physicians involved in your care Any changes since last visit?  no   Family History  Problem Relation Age of Onset  . Heart attack Father   . Alzheimer's disease Mother   . Aneurysm Brother        brain   Social History   Socioeconomic History  . Marital status: Married    Spouse name: Not on file  . Number of children: Not on file  . Years of education: Not on file  . Highest education level: Not on file  Occupational History  . Occupation: Education officer, environmental  Social Needs  . Financial resource strain: Not on file  . Food insecurity    Worry: Not on file    Inability: Not on file  . Transportation needs    Medical: Not on file    Non-medical: Not on file  Tobacco Use  . Smoking status:  Former Games developer  . Smokeless tobacco: Never Used  Substance and Sexual Activity  . Alcohol use: Not on file  . Drug use: Not on file  . Sexual activity: Not on file  Lifestyle  . Physical activity    Days per week: Not on file    Minutes per session: Not on file  . Stress: Not on file  Relationships  . Social Musician on phone: Not on file    Gets together: Not on file    Attends religious service: Not on file    Active member of club or organization: Not on file    Attends meetings of clubs or organizations: Not on file    Relationship status: Not on file  Other Topics Concern  . Not on file  Social History Narrative  . Not on file   Past Surgical History:  Procedure Laterality Date  . ANTERIOR CERVICAL DECOMP/DISCECTOMY FUSION N/A 11/14/2014   Procedure: Cervical Five-Cervical Six  Anterior Cervical Decompression/Diskectomy/Fusion;  Surgeon: Maeola Harman, MD;  Location: [facility];  Service: Neurosurgery;  Laterality: N/A;   Past Medical History:  Diagnosis Date  . Bradycardia   . Chronic abdominal pain   . Hesitancy of micturition    There were no vitals taken for this visit.  Opioid Risk Score:  Fall Risk Score:  `1  Depression screen PHQ 2/9  Depression screen [facility] 2/9 08/11/2018 07/20/2017 12/24/2015 01/09/2015  Decreased Interest 0 0 0 0  Down, Depressed, Hopeless 0 0 0 0  PHQ - 2 Score 0 0 0 0  Altered sleeping - - - 0  Tired, decreased energy - - - 1  Change in appetite - - - 1  Feeling bad or failure about yourself  - - - 0  Trouble concentrating - - - 0  Moving slowly or fidgety/restless - - - 0  Suicidal thoughts - - - 0  PHQ-9 Score - - - 2    Review of Systems  Constitutional: Positive for appetite change.  HENT: Negative.   Eyes: Negative.   Respiratory: Positive for shortness of breath.   Cardiovascular: Positive for leg swelling.  Gastrointestinal: Positive for abdominal pain.  Endocrine: Negative.   Genitourinary: Negative.    Musculoskeletal: Positive for arthralgias, back pain, gait problem and myalgias.  Skin: Negative.   Allergic/Immunologic: Negative.   Neurological: Positive for tremors, weakness and numbness.  Hematological: Negative.   Psychiatric/Behavioral: Negative.   All other systems reviewed and are negative.      Objective:   Physical Exam Vitals signs and nursing note reviewed.  Constitutional:      Appearance: Normal appearance.  Neck:     Musculoskeletal: Normal range of motion and neck supple.  Cardiovascular:     Rate and Rhythm: Normal rate and regular rhythm.     Pulses: Normal pulses.     Heart sounds: Normal heart sounds.  Pulmonary:     Effort: Pulmonary effort is normal.     Breath sounds: Normal breath sounds.  Musculoskeletal:     Comments: Normal Muscle Bulk and Muscle Testing Reveals:  Upper Extremities: Full ROM and Muscle Strength 5/5 Thoracic Paraspinal Tenderness: T-7-T-9 Lumbar Paraspinal Tenderness: L-3-L-5 Lower Extremities: Full ROM and Muscle Strength 5/5 Arises from chair slowly using cane for support Narrow Based Gait   Skin:    General: Skin is warm and dry.  Neurological:     Mental Status: He is alert and oriented to person, place, and time.  Psychiatric:        Mood and Affect: Mood normal.        Behavior: Behavior normal.           Assessment & Plan:  1. C6 Spinal Cord Injury:/ . Spastic Tetraplegia: Continue HEP as Tolerated and Continue to monitor.02/10/2019 2. Neurogenic Bladder: Continue with I/O Caths Patient reports he performs 3 in and out caths per day ranging200-500 cc. 3. Neurogenic Bowel: Continue Bowel Program:02/10/2019 4. Chronic Pain Syndrome: Continue Gabapentin, Pamelor, Butrans73mcg/hr Patch weekly and Tramadol.02/10/2019 We will continue the opioid monitoring program, this consists of regular clinic visits, examinations, urine drug screen, pill counts as well as use of [place] Controlled Substance  Reporting system.  65minutes of face to face patient care time was spent during this visit. All questions were encouraged and answered.  F/U in18months

## 2019-02-11 ENCOUNTER — Telehealth: Payer: Self-pay | Admitting: *Deleted

## 2019-02-11 ENCOUNTER — Telehealth: Payer: Self-pay | Admitting: Registered Nurse

## 2019-02-11 DIAGNOSIS — S14106S Unspecified injury at C6 level of cervical spinal cord, sequela: Secondary | ICD-10-CM

## 2019-02-11 DIAGNOSIS — K592 Neurogenic bowel, not elsewhere classified: Secondary | ICD-10-CM

## 2019-02-11 DIAGNOSIS — G8250 Quadriplegia, unspecified: Secondary | ICD-10-CM

## 2019-02-11 DIAGNOSIS — N319 Neuromuscular dysfunction of bladder, unspecified: Secondary | ICD-10-CM

## 2019-02-11 DIAGNOSIS — M792 Neuralgia and neuritis, unspecified: Secondary | ICD-10-CM

## 2019-02-11 DIAGNOSIS — G894 Chronic pain syndrome: Secondary | ICD-10-CM

## 2019-02-11 NOTE — Telephone Encounter (Signed)
Carl [name] called and said he was returning your call.  I do not see a message in the system,so I did not know what this may be about.

## 2019-02-11 NOTE — Telephone Encounter (Signed)
Patient returned your call. Please call him at  [phone].

## 2019-02-12 LAB — TOXASSURE SELECT,+ANTIDEPR,UR

## 2019-02-14 ENCOUNTER — Telehealth: Payer: Self-pay | Admitting: *Deleted

## 2019-02-14 MED ORDER — TRAMADOL HCL 50 MG PO TABS: ORAL_TABLET | ORAL | 2 refills | Status: DC

## 2019-02-14 MED ORDER — BUPRENORPHINE 15 MCG/HR TD PTWK: MEDICATED_PATCH | TRANSDERMAL | 2 refills | Status: DC

## 2019-02-14 NOTE — Telephone Encounter (Signed)
Urine drug screen for this encounter is consistent for prescribed medication 

## 2019-02-14 NOTE — Addendum Note (Signed)
Addended by: Bayard Hugger on: 02/14/2019 08:38 AM   Modules accepted: Orders

## 2019-02-14 NOTE — Telephone Encounter (Signed)
This provider placed a call to [place] on 02/10/2019 and spoke with pharmacist regarding the refills on Carl [name] and Tramadol. On PMP it was listed no refills.They have the refills, placed a call to Carl [name] regarding the above and he verbalizes understanding. [name] and Tramadol was e-scribed for the following months and he verbalizes understanding.

## 2019-04-07 ENCOUNTER — Other Ambulatory Visit: Payer: Self-pay | Admitting: Physical Medicine & Rehabilitation

## 2019-04-07 DIAGNOSIS — K592 Neurogenic bowel, not elsewhere classified: Secondary | ICD-10-CM

## 2019-04-07 DIAGNOSIS — G894 Chronic pain syndrome: Secondary | ICD-10-CM

## 2019-04-07 DIAGNOSIS — G8250 Quadriplegia, unspecified: Secondary | ICD-10-CM

## 2019-04-07 DIAGNOSIS — N319 Neuromuscular dysfunction of bladder, unspecified: Secondary | ICD-10-CM

## 2019-04-07 DIAGNOSIS — S14106S Unspecified injury at C6 level of cervical spinal cord, sequela: Secondary | ICD-10-CM

## 2019-04-14 ENCOUNTER — Other Ambulatory Visit: Payer: Self-pay | Admitting: Physical Medicine & Rehabilitation

## 2019-04-26 ENCOUNTER — Other Ambulatory Visit: Payer: Self-pay | Admitting: Physical Medicine & Rehabilitation

## 2019-04-26 DIAGNOSIS — G8250 Quadriplegia, unspecified: Secondary | ICD-10-CM

## 2019-04-26 DIAGNOSIS — M792 Neuralgia and neuritis, unspecified: Secondary | ICD-10-CM

## 2019-04-26 DIAGNOSIS — G894 Chronic pain syndrome: Secondary | ICD-10-CM

## 2019-04-27 NOTE — Telephone Encounter (Signed)
buprenorphine refilled

## 2019-04-27 NOTE — Telephone Encounter (Signed)
recieved electric medication refill request for Butrans patches. According to PMP aware:  Fill Date Drug     Qty Prescriber 03/31/2019 Buprenorphine 15 Mcg/hr Patch 4.00 Za Swa

## 2019-05-11 ENCOUNTER — Other Ambulatory Visit: Payer: Self-pay

## 2019-05-11 ENCOUNTER — Encounter: Payer: Self-pay | Admitting: Physical Medicine & Rehabilitation

## 2019-05-11 ENCOUNTER — Encounter: Payer: Medicare Other | Attending: Physical Medicine & Rehabilitation | Admitting: Physical Medicine & Rehabilitation

## 2019-05-11 DIAGNOSIS — Z87891 Personal history of nicotine dependence: Secondary | ICD-10-CM | POA: Diagnosis not present

## 2019-05-11 DIAGNOSIS — S14106S Unspecified injury at C6 level of cervical spinal cord, sequela: Secondary | ICD-10-CM | POA: Diagnosis present

## 2019-05-11 DIAGNOSIS — Z79891 Long term (current) use of opiate analgesic: Secondary | ICD-10-CM | POA: Diagnosis present

## 2019-05-11 DIAGNOSIS — G959 Disease of spinal cord, unspecified: Secondary | ICD-10-CM | POA: Insufficient documentation

## 2019-05-11 DIAGNOSIS — G9589 Other specified diseases of spinal cord: Secondary | ICD-10-CM | POA: Diagnosis present

## 2019-05-11 DIAGNOSIS — R109 Unspecified abdominal pain: Secondary | ICD-10-CM | POA: Insufficient documentation

## 2019-05-11 DIAGNOSIS — R490 Dysphonia: Secondary | ICD-10-CM | POA: Insufficient documentation

## 2019-05-11 DIAGNOSIS — M25512 Pain in left shoulder: Secondary | ICD-10-CM | POA: Insufficient documentation

## 2019-05-11 DIAGNOSIS — G894 Chronic pain syndrome: Secondary | ICD-10-CM | POA: Diagnosis present

## 2019-05-11 DIAGNOSIS — S12500A Unspecified displaced fracture of sixth cervical vertebra, initial encounter for closed fracture: Secondary | ICD-10-CM | POA: Diagnosis not present

## 2019-05-11 DIAGNOSIS — K592 Neurogenic bowel, not elsewhere classified: Secondary | ICD-10-CM | POA: Insufficient documentation

## 2019-05-11 DIAGNOSIS — N390 Urinary tract infection, site not specified: Secondary | ICD-10-CM | POA: Diagnosis not present

## 2019-05-11 DIAGNOSIS — N319 Neuromuscular dysfunction of bladder, unspecified: Secondary | ICD-10-CM | POA: Diagnosis present

## 2019-05-11 DIAGNOSIS — R001 Bradycardia, unspecified: Secondary | ICD-10-CM | POA: Insufficient documentation

## 2019-05-11 DIAGNOSIS — G8929 Other chronic pain: Secondary | ICD-10-CM | POA: Insufficient documentation

## 2019-05-11 DIAGNOSIS — M545 Low back pain: Secondary | ICD-10-CM | POA: Diagnosis not present

## 2019-05-11 DIAGNOSIS — R1310 Dysphagia, unspecified: Secondary | ICD-10-CM | POA: Diagnosis not present

## 2019-05-11 DIAGNOSIS — G8250 Quadriplegia, unspecified: Secondary | ICD-10-CM | POA: Diagnosis present

## 2019-05-11 DIAGNOSIS — Z5181 Encounter for therapeutic drug level monitoring: Secondary | ICD-10-CM | POA: Diagnosis present

## 2019-05-11 MED ORDER — TRAMADOL HCL 50 MG PO TABS: ORAL_TABLET | ORAL | 2 refills | Status: DC

## 2019-05-11 NOTE — Progress Notes (Signed)
Noted while gathering vitals and statistics that the patients heart rate was rapidly increasing and decreasing at a range of 65-95 bpm.

## 2019-05-11 NOTE — Patient Instructions (Signed)
PLEASE FEEL FREE TO CALL OUR OFFICE WITH ANY PROBLEMS OR QUESTIONS ([phone])      

## 2019-05-11 NOTE — Progress Notes (Signed)
Subjective:    Patient ID: Carl [name], male    DOB: [birth year], 69 y.o.   MRN: [id number]  HPI   Carl [name] is here in follow-up of his cervical myelopathy and associated deficits.  He feels that over the last few months he is taking some steps backwards from a standpoint of his mobility balance and stamina.  He tells me that he had some "fluid drained off of his lung" a few weeks ago.  Is able to go back through his records and he saw pulmonary at [facility] who did a thoracentesis on 03/30/2019.  I could find no other associated notes.  Apparently he has seen a doctor again this past month.  He still feels a bit short of breath at rest although improved from before.  He is using his cane for balance.  He does do some short distance walking at home but is limited otherwise due to Covid etc.  From a pain standpoint he is doing fairly well.  The tramadol as well as the buprenorphine patch seem to be giving him reasonable control of his neuropathic pain.  He still has some tingling in his arms and feet.  He is also on gabapentin and nortriptyline.  He feels that his hands and forearms have wasted a bit due to the neurological deficits.  Pain Inventory Average Pain 7 Pain Right Now 7 My pain is sharp, burning, dull, stabbing, tingling and aching  In the last 24 hours, has pain interfered with the following? General activity 8 Relation with others 6 Enjoyment of life 10 What TIME of day is your pain at its worst? evening Sleep (in general) Fair  Pain is worse with: walking, bending, standing and some activites Pain improves with: rest and medication Relief from Meds: 4  Mobility use a cane ability to climb steps?  yes do you drive?  yes  Function not employed: date last employed . I need assistance with the following:  meal prep and household duties  Neuro/Psych weakness numbness tremor tingling trouble walking spasms  Prior Studies Any changes since last visit?   no  Physicians involved in your care Any changes since last visit?  no   Family History  Problem Relation Age of Onset  . Heart attack Father   . Alzheimer's disease Mother   . Aneurysm Brother        brain   Social History   Socioeconomic History  . Marital status: Married    Spouse name: Not on file  . Number of children: Not on file  . Years of education: Not on file  . Highest education level: Not on file  Occupational History  . Occupation: Pastor  Tobacco Use  . Smoking status: Former Games developer  . Smokeless tobacco: Never Used  Substance and Sexual Activity  . Alcohol use: Not on file  . Drug use: Not on file  . Sexual activity: Not on file  Other Topics Concern  . Not on file  Social History Narrative  . Not on file   Social Determinants of Health   Financial Resource Strain:   . Difficulty of Paying Living Expenses: Not on file  Food Insecurity:   . Worried About Programme researcher, broadcasting/film/video in the Last Year: Not on file  . Ran Out of Food in the Last Year: Not on file  Transportation Needs:   . Lack of Transportation (Medical): Not on file  . Lack of Transportation (Non-Medical): Not on file  Physical  Activity:   . Days of Exercise per Week: Not on file  . Minutes of Exercise per Session: Not on file  Stress:   . Feeling of Stress : Not on file  Social Connections:   . Frequency of Communication with Friends and Family: Not on file  . Frequency of Social Gatherings with Friends and Family: Not on file  . Attends Religious Services: Not on file  . Active Member of Clubs or Organizations: Not on file  . Attends Banker Meetings: Not on file  . Marital Status: Not on file   Past Surgical History:  Procedure Laterality Date  . ANTERIOR CERVICAL DECOMP/DISCECTOMY FUSION N/A 11/14/2014   Procedure: Cervical Five-Cervical Six  Anterior Cervical Decompression/Diskectomy/Fusion;  Surgeon: Maeola Harman, MD;  Location: [facility];  Service: Neurosurgery;   Laterality: N/A;   Past Medical History:  Diagnosis Date  . Bradycardia   . Chronic abdominal pain   . Hesitancy of micturition    There were no vitals taken for this visit.  Opioid Risk Score:   Fall Risk Score:  `1  Depression screen PHQ 2/9  Depression screen [facility] 2/9 08/11/2018 07/20/2017 12/24/2015 01/09/2015  Decreased Interest 0 0 0 0  Down, Depressed, Hopeless 0 0 0 0  PHQ - 2 Score 0 0 0 0  Altered sleeping - - - 0  Tired, decreased energy - - - 1  Change in appetite - - - 1  Feeling bad or failure about yourself  - - - 0  Trouble concentrating - - - 0  Moving slowly or fidgety/restless - - - 0  Suicidal thoughts - - - 0  PHQ-9 Score - - - 2     Review of Systems  Constitutional: Positive for appetite change.  HENT: Negative.   Eyes: Negative.   Respiratory: Positive for shortness of breath.   Cardiovascular: Negative.   Gastrointestinal: Positive for abdominal pain.  Endocrine: Negative.   Genitourinary: Negative.   Musculoskeletal: Positive for arthralgias, back pain and myalgias.  Skin: Negative.   Allergic/Immunologic: Negative.   Neurological: Positive for tremors, weakness and numbness.  Hematological: Negative.   Psychiatric/Behavioral: Negative.   All other systems reviewed and are negative.      Objective:   Physical Exam Gen: no distress, normal appearing HEENT: oral mucosa pink and moist, NCAT Cardio: Reg rate at about 80 bpm Chest: normal effort, decreased sounds in the left lung base. Abd: soft, non-distended Ext: no edema Skin: intact Neuro: Slightly dysarthric.  Upper extremity strength is grossly 4 out of 5 proximal to 4- out of 5 distally.  Right lower extremity strength is 4+ out of 5 proximal distal.  Left lower extremity is 4- out of 5 hip flexors knee extensors and perhaps 4-5 left ankle dorsiflexors and plantar flexors.  Sensation is 1-2 light touch and pain in all 4 limbs left more affected than right.  Reflexes are hyperactive in  all 4. Musculoskeletal: Psych: Pleasant but a bit flat       Assessment & Plan:  1. Functional deficits secondary to C6 ASIA C incomplete tetraplegia, spinal cord injury due to fall with C5-C6 fracture and neurogenic bowel and bladder  -will make a referral to [facility] regional outpt therapies to address balance, stamina, LE strength  3. Pain Management: gabapentin continue at 300mg  TID--refilled today -Continue Pamelor 25 mg nightly - tramadol 50 mg q6 prn #420 no RF needed today -.We will continue the controlled substance monitoring program, this consists of regular  clinic visits, examinations, routine drug screening, pill counts as well as use of [place] Controlled Substance Reporting System. [place] today.  .  -continue buprenorphine patch  15 mcg per week, #4 with 3 refills. NO rf today 4. Mood: I still feel this plays a role here. 5. Neurogenic bladder---I/O caths- ?coude tip cath #16 -follow up with urology. -300-500 cc caths   6. Neurogenic bowel: senna in pm and fleet enema qam. -sorbitol, miralax for rescue may continue               7. Chronic bradycardia---baseline around 40.asymptomatic 8.Dysphagia:MBS with oro-pharyngeal delay and decreased laryngeal elevation. delaye UES closure also with weak respiratory muscle functioning during speech, cough, etc -icontinue with compensatory strategies 9. Left pleural effusion s/p thoracentesis  -followed by [facility]  -source?  -needs follow upif he continues to feel sob  Fifteen minutes of face to face patient care time were spent during this visit. All questions were encouraged and answered.  Follow up with me in 2 mos

## 2019-06-17 ENCOUNTER — Other Ambulatory Visit: Payer: Self-pay | Admitting: Registered Nurse

## 2019-06-17 DIAGNOSIS — K592 Neurogenic bowel, not elsewhere classified: Secondary | ICD-10-CM

## 2019-06-17 DIAGNOSIS — N319 Neuromuscular dysfunction of bladder, unspecified: Secondary | ICD-10-CM

## 2019-06-17 DIAGNOSIS — G894 Chronic pain syndrome: Secondary | ICD-10-CM

## 2019-06-17 DIAGNOSIS — G8250 Quadriplegia, unspecified: Secondary | ICD-10-CM

## 2019-06-17 DIAGNOSIS — S14106S Unspecified injury at C6 level of cervical spinal cord, sequela: Secondary | ICD-10-CM

## 2019-07-06 ENCOUNTER — Other Ambulatory Visit: Payer: Self-pay

## 2019-07-06 ENCOUNTER — Encounter: Payer: Self-pay | Admitting: Physical Medicine & Rehabilitation

## 2019-07-06 ENCOUNTER — Encounter: Payer: Medicare Other | Attending: Physical Medicine & Rehabilitation | Admitting: Physical Medicine & Rehabilitation

## 2019-07-06 VITALS — BP 94/64 | HR 86 | Temp 97.7°F | Ht 68.0 in | Wt 153.6 lb

## 2019-07-06 DIAGNOSIS — R490 Dysphonia: Secondary | ICD-10-CM | POA: Diagnosis not present

## 2019-07-06 DIAGNOSIS — S12500A Unspecified displaced fracture of sixth cervical vertebra, initial encounter for closed fracture: Secondary | ICD-10-CM | POA: Diagnosis not present

## 2019-07-06 DIAGNOSIS — R001 Bradycardia, unspecified: Secondary | ICD-10-CM | POA: Diagnosis not present

## 2019-07-06 DIAGNOSIS — G894 Chronic pain syndrome: Secondary | ICD-10-CM | POA: Diagnosis present

## 2019-07-06 DIAGNOSIS — Z79891 Long term (current) use of opiate analgesic: Secondary | ICD-10-CM | POA: Insufficient documentation

## 2019-07-06 DIAGNOSIS — G8250 Quadriplegia, unspecified: Secondary | ICD-10-CM | POA: Insufficient documentation

## 2019-07-06 DIAGNOSIS — J90 Pleural effusion, not elsewhere classified: Secondary | ICD-10-CM | POA: Diagnosis not present

## 2019-07-06 DIAGNOSIS — N390 Urinary tract infection, site not specified: Secondary | ICD-10-CM | POA: Diagnosis not present

## 2019-07-06 DIAGNOSIS — M25512 Pain in left shoulder: Secondary | ICD-10-CM | POA: Diagnosis not present

## 2019-07-06 DIAGNOSIS — G959 Disease of spinal cord, unspecified: Secondary | ICD-10-CM | POA: Insufficient documentation

## 2019-07-06 DIAGNOSIS — R109 Unspecified abdominal pain: Secondary | ICD-10-CM | POA: Insufficient documentation

## 2019-07-06 DIAGNOSIS — N319 Neuromuscular dysfunction of bladder, unspecified: Secondary | ICD-10-CM | POA: Insufficient documentation

## 2019-07-06 DIAGNOSIS — R1310 Dysphagia, unspecified: Secondary | ICD-10-CM | POA: Diagnosis not present

## 2019-07-06 DIAGNOSIS — Z5181 Encounter for therapeutic drug level monitoring: Secondary | ICD-10-CM | POA: Insufficient documentation

## 2019-07-06 DIAGNOSIS — M545 Low back pain: Secondary | ICD-10-CM | POA: Diagnosis not present

## 2019-07-06 DIAGNOSIS — G8929 Other chronic pain: Secondary | ICD-10-CM | POA: Diagnosis not present

## 2019-07-06 DIAGNOSIS — G9589 Other specified diseases of spinal cord: Secondary | ICD-10-CM | POA: Diagnosis present

## 2019-07-06 DIAGNOSIS — K592 Neurogenic bowel, not elsewhere classified: Secondary | ICD-10-CM | POA: Insufficient documentation

## 2019-07-06 DIAGNOSIS — Z87891 Personal history of nicotine dependence: Secondary | ICD-10-CM | POA: Diagnosis not present

## 2019-07-06 DIAGNOSIS — S14106S Unspecified injury at C6 level of cervical spinal cord, sequela: Secondary | ICD-10-CM | POA: Insufficient documentation

## 2019-07-06 NOTE — Progress Notes (Signed)
Subjective:    Patient ID: Carl [name], male    DOB: May 04, 1950, 69 y.o.   MRN: [id number]  HPI   Carl [name]  is here in follow-up of his cervical spinal cord injury and associated deficits.  We had sent him for outpatient therapy to work on stamina strength and balance.  He has had some improvements with his strength and coordination however stamina still is an issue.  Has had more problems breathing.  He has a history of a left sided pleural effusion.  He has appointment set up on Monday to see pulmonology.  He becomes short of breath with minimal activity at this point.  Pain levels are fairly well controlled with his current regimen which includes gabapentin buprenorphine and tramadol.  Pain Inventory Average Pain 4 Pain Right Now 5 My pain is sharp, stabbing and tingling  In the last 24 hours, has pain interfered with the following? General activity 5 Relation with others 3 Enjoyment of life 8 What TIME of day is your pain at its worst? morning and evening Sleep (in general) Fair  Pain is worse with: walking, standing and some activites Pain improves with: rest and medication Relief from Meds: 4  Mobility use a cane use a walker how many minutes can you walk? 5 ability to climb steps?  yes do you drive?  yes  Function disabled: date disabled 10/2017 I need assistance with the following:  meal prep and household duties  Neuro/Psych weakness numbness tremor tingling trouble walking spasms  Prior Studies x-rays CT/MRI  Physicians involved in your care Primary care . Neurologist .   Family History  Problem Relation Age of Onset  . Heart attack Father   . Alzheimer's disease Mother   . Aneurysm Brother        brain   Social History   Socioeconomic History  . Marital status: Married    Spouse name: Not on file  . Number of children: Not on file  . Years of education: Not on file  . Highest education level: Not on file  Occupational History  .  Occupation: Pastor  Tobacco Use  . Smoking status: Former Games developer  . Smokeless tobacco: Never Used  Substance and Sexual Activity  . Alcohol use: Not on file  . Drug use: Not on file  . Sexual activity: Not on file  Other Topics Concern  . Not on file  Social History Narrative  . Not on file   Social Determinants of Health   Financial Resource Strain:   . Difficulty of Paying Living Expenses:   Food Insecurity:   . Worried About Programme researcher, broadcasting/film/video in the Last Year:   . Barista in the Last Year:   Transportation Needs:   . Freight forwarder (Medical):   Marland Kitchen Lack of Transportation (Non-Medical):   Physical Activity:   . Days of Exercise per Week:   . Minutes of Exercise per Session:   Stress:   . Feeling of Stress :   Social Connections:   . Frequency of Communication with Friends and Family:   . Frequency of Social Gatherings with Friends and Family:   . Attends Religious Services:   . Active Member of Clubs or Organizations:   . Attends Banker Meetings:   Marland Kitchen Marital Status:    Past Surgical History:  Procedure Laterality Date  . ANTERIOR CERVICAL DECOMP/DISCECTOMY FUSION N/A 11/14/2014   Procedure: Cervical Five-Cervical Six  Anterior Cervical Decompression/Diskectomy/Fusion;  Surgeon:  Erline Levine, MD;  Location: [place] [facility];  Service: Neurosurgery;  Laterality: N/A;   Past Medical History:  Diagnosis Date  . Bradycardia   . Chronic abdominal pain   . Hesitancy of micturition    BP 94/64   Pulse 86   Temp 97.7 F (36.5 C)   Ht 5\' 8"  (1.727 m)   Wt 153 lb 9.6 oz (69.7 kg)   SpO2 92%   BMI 23.35 kg/m   Opioid Risk Score:   Fall Risk Score:  `1  Depression screen PHQ 2/9  Depression screen [facility] 2/9 08/11/2018 07/20/2017 12/24/2015 01/09/2015  Decreased Interest 0 0 0 0  Down, Depressed, Hopeless 0 0 0 0  PHQ - 2 Score 0 0 0 0  Altered sleeping - - - 0  Tired, decreased energy - - - 1  Change in appetite - - - 1  Feeling bad or  failure about yourself  - - - 0  Trouble concentrating - - - 0  Moving slowly or fidgety/restless - - - 0  Suicidal thoughts - - - 0  PHQ-9 Score - - - 2    Review of Systems  Constitutional: Positive for unexpected weight change.  Respiratory: Positive for shortness of breath.   Gastrointestinal: Positive for abdominal pain.       Poor appetite        Objective:   Physical Exam Constitutional: No distress . Vital signs reviewed. HEENT: EOMI, oral membranes moist Neck: supple Cardiovascular: RRR without murmur. No JVD    Respiratory/Chest: Decreased breath sounds midway up the left lung. GI/Abdomen: BS +, non-tender, non-distended Ext: no clubbing, cyanosis, or edema Psych: pleasant and cooperative Neuro: Slightly dysarthric.  Upper extremity strength is grossly 4 out of 5 proximal to 4 out of 5 distally.  Right lower extremity strength is 4+ out of 5 proximal distal.  Left lower extremity is 4 out of 5 hip flexors knee extensors and perhaps 4-5 left ankle dorsiflexors and plantar flexors.  Sensation is 1-2 light touch and pain in all 4 limbs left more affected than right.    Reflexes are 3+ Musculoskeletal: Psych:  Quiet but pleasant as always.          Assessment & Plan:  1. Functional deficits secondary to C6 ASIA C incomplete tetraplegia, spinal cord injury due to fall with C5-C6 fracture and neurogenic bowel and bladder   -continue outpt therapies at [facility] for strength, coordination -work more on stamina at home once pulmonary issues addressed 3. Pain Management: gabapentin continue at 300mg  TID--refilled today -Continue Pamelor 25 mg nightly - tramadol 50 mg q6  prn #420   -We will continue the controlled substance monitoring program, this consists of regular clinic visits, examinations, routine drug screening, pill counts as well as use of [place] Controlled Substance Reporting System. [place] was reviewed today.   -continue buprenorphine patch  15 mcg per week, #4  with 3 refills.  -drug swab today.  -no meds needed today 4. Mood: I still feel this plays a role here. 5. Neurogenic bladder---I/O caths-  ?coude tip cath #16             -follow up with urology.                         - 300-500 cc caths               6. Neurogenic bowel: senna in pm and fleet enema qam.             -  sorbitol, miralax for rescue may continue               7. Chronic bradycardia---baseline around 40.  asymptomatic 8.  Dysphagia: MBS with oro-pharyngeal delay and decreased laryngeal elevation. delaye UES closure also with weak respiratory muscle functioning during speech, cough, etc                         -continue compensatory strategies 9. Left pleural effusion s/p thoracentesis  -more SOB, decreased BS on left             -followed by [facility]---appt monday             -                                       Fifteen minutes of face to face patient care time were spent during this visit. All questions were encouraged and answered.  Follow up with me in 3 mos

## 2019-07-06 NOTE — Patient Instructions (Signed)
PLEASE FEEL FREE TO CALL OUR OFFICE WITH ANY PROBLEMS OR QUESTIONS ([phone])      

## 2019-07-09 LAB — DRUG TOX MONITOR 1 W/CONF, ORAL FLD

## 2019-07-09 LAB — DRUG TOX ALC METAB W/CON, ORAL FLD: Alcohol Metabolite: NEGATIVE ng/mL (ref <25)

## 2019-07-10 ENCOUNTER — Other Ambulatory Visit: Payer: Self-pay | Admitting: Physical Medicine & Rehabilitation

## 2019-07-11 ENCOUNTER — Telehealth: Payer: Self-pay | Admitting: *Deleted

## 2019-07-11 NOTE — Telephone Encounter (Signed)
Oral swab drug screen was consistent for prescribed medications. His tramadol is present but the buprenorphine does not show present, though it could have been just below the [facility] threshold. It has been present in [facility] screen in past.

## 2019-07-12 ENCOUNTER — Telehealth: Payer: Self-pay

## 2019-07-12 NOTE — Telephone Encounter (Signed)
UDS RESULTS CONSISTENT WITH MEDICATIONS ON FILE  

## 2019-07-13 ENCOUNTER — Ambulatory Visit: Payer: Medicare Other | Admitting: Physical Medicine & Rehabilitation

## 2019-07-20 ENCOUNTER — Other Ambulatory Visit: Payer: Self-pay | Admitting: Physical Medicine & Rehabilitation

## 2019-07-20 DIAGNOSIS — K592 Neurogenic bowel, not elsewhere classified: Secondary | ICD-10-CM

## 2019-07-20 DIAGNOSIS — S14106S Unspecified injury at C6 level of cervical spinal cord, sequela: Secondary | ICD-10-CM

## 2019-07-20 DIAGNOSIS — G894 Chronic pain syndrome: Secondary | ICD-10-CM

## 2019-07-20 DIAGNOSIS — G8250 Quadriplegia, unspecified: Secondary | ICD-10-CM

## 2019-07-20 DIAGNOSIS — N319 Neuromuscular dysfunction of bladder, unspecified: Secondary | ICD-10-CM

## 2019-08-16 ENCOUNTER — Other Ambulatory Visit: Payer: Self-pay | Admitting: Physical Medicine & Rehabilitation

## 2019-08-16 DIAGNOSIS — G894 Chronic pain syndrome: Secondary | ICD-10-CM

## 2019-08-16 DIAGNOSIS — G8250 Quadriplegia, unspecified: Secondary | ICD-10-CM

## 2019-08-16 DIAGNOSIS — M792 Neuralgia and neuritis, unspecified: Secondary | ICD-10-CM

## 2019-10-03 ENCOUNTER — Other Ambulatory Visit: Payer: Self-pay | Admitting: Physical Medicine & Rehabilitation

## 2019-10-05 ENCOUNTER — Encounter: Payer: Medicare Other | Attending: Registered Nurse | Admitting: Registered Nurse

## 2019-10-05 ENCOUNTER — Other Ambulatory Visit: Payer: Self-pay

## 2019-10-05 VITALS — BP 105/70 | HR 87 | Temp 98.7°F | Ht 68.0 in | Wt 136.0 lb

## 2019-10-05 DIAGNOSIS — K592 Neurogenic bowel, not elsewhere classified: Secondary | ICD-10-CM | POA: Diagnosis present

## 2019-10-05 DIAGNOSIS — Z5181 Encounter for therapeutic drug level monitoring: Secondary | ICD-10-CM

## 2019-10-05 DIAGNOSIS — Z79891 Long term (current) use of opiate analgesic: Secondary | ICD-10-CM

## 2019-10-05 DIAGNOSIS — N319 Neuromuscular dysfunction of bladder, unspecified: Secondary | ICD-10-CM | POA: Diagnosis present

## 2019-10-05 DIAGNOSIS — G8250 Quadriplegia, unspecified: Secondary | ICD-10-CM

## 2019-10-05 DIAGNOSIS — G894 Chronic pain syndrome: Secondary | ICD-10-CM | POA: Diagnosis present

## 2019-10-05 DIAGNOSIS — S14106S Unspecified injury at C6 level of cervical spinal cord, sequela: Secondary | ICD-10-CM

## 2019-10-05 DIAGNOSIS — M792 Neuralgia and neuritis, unspecified: Secondary | ICD-10-CM | POA: Diagnosis present

## 2019-10-05 MED ORDER — BUPRENORPHINE 15 MCG/HR TD PTWK: MEDICATED_PATCH | TRANSDERMAL | 2 refills | Status: DC

## 2019-10-05 MED ORDER — TRAMADOL HCL 50 MG PO TABS: ORAL_TABLET | ORAL | 2 refills | Status: DC

## 2019-10-05 NOTE — Progress Notes (Signed)
Subjective:    Patient ID: Carl [name], male    DOB: Apr 19, 1950, 69 y.o.   MRN: [id number]  HPI: Carl [name] is a 69 y.o. male who returns for follow up appointment for chronic pain and medication refill. He states his pain is located in his left hand and lower back pain. Also reports abdominal pain due to cholelithiasis and his PCP is following.  She rates her pain 4. His current exercise regime is walking in his home with his walker, he arrived in wheelchair.   Wife in room, all questions answered.   Carl [name] Morphine equivalent is 20.00 MME.   Last Oral Swab was Performed on 07/06/2019, it was consistent for Tramadol.    Pain Inventory Average Pain 4 Pain Right Now 4 My pain is sharp and stabbing  In the last 24 hours, has pain interfered with the following? General activity 4 Relation with others 2 Enjoyment of life 7 What TIME of day is your pain at its worst? morning and evening Sleep (in general) Fair  Pain is worse with: walking, standing and some activites Pain improves with: rest and medication Relief from Meds: 4  Mobility use a cane use a walker how many minutes can you walk? 5 ability to climb steps?  yes do you drive?  yes  Function disabled: date disabled . I need assistance with the following:  meal prep and household duties  Neuro/Psych weakness numbness tremor tingling trouble walking spasms dizziness  Prior Studies Any changes since last visit?  no x-rays CT/MRI  Physicians involved in your care Primary care . Neurologist .   Family History  Problem Relation Age of Onset  . Heart attack Father   . Alzheimer's disease Mother   . Aneurysm Brother        brain   Social History   Socioeconomic History  . Marital status: Married    Spouse name: Not on file  . Number of children: Not on file  . Years of education: Not on file  . Highest education level: Not on file  Occupational History  . Occupation: Pastor  Tobacco Use    . Smoking status: Former Games developer  . Smokeless tobacco: Never Used  Substance and Sexual Activity  . Alcohol use: Not on file  . Drug use: Not on file  . Sexual activity: Not on file  Other Topics Concern  . Not on file  Social History Narrative  . Not on file   Social Determinants of Health   Financial Resource Strain:   . Difficulty of Paying Living Expenses:   Food Insecurity:   . Worried About Programme researcher, broadcasting/film/video in the Last Year:   . Barista in the Last Year:   Transportation Needs:   . Freight forwarder (Medical):   Marland Kitchen Lack of Transportation (Non-Medical):   Physical Activity:   . Days of Exercise per Week:   . Minutes of Exercise per Session:   Stress:   . Feeling of Stress :   Social Connections:   . Frequency of Communication with Friends and Family:   . Frequency of Social Gatherings with Friends and Family:   . Attends Religious Services:   . Active Member of Clubs or Organizations:   . Attends Banker Meetings:   Marland Kitchen Marital Status:    Past Surgical History:  Procedure Laterality Date  . ANTERIOR CERVICAL DECOMP/DISCECTOMY FUSION N/A 11/14/2014   Procedure: Cervical Five-Cervical Six  Anterior Cervical Decompression/Diskectomy/Fusion;  Surgeon: Maeola Harman, MD;  Location: [facility];  Service: Neurosurgery;  Laterality: N/A;   Past Medical History:  Diagnosis Date  . Bradycardia   . Chronic abdominal pain   . Hesitancy of micturition    BP 105/70   Pulse 87   Temp 98.7 F (37.1 C)   Ht 5\' 8"  (1.727 m)   Wt 136 lb (61.7 kg)   SpO2 90%   BMI 20.68 kg/m   Opioid Risk Score:   Fall Risk Score:  `1  Depression screen PHQ 2/9  Depression screen [facility] 2/9 08/11/2018 07/20/2017 12/24/2015 01/09/2015  Decreased Interest 0 0 0 0  Down, Depressed, Hopeless 0 0 0 0  PHQ - 2 Score 0 0 0 0  Altered sleeping - - - 0  Tired, decreased energy - - - 1  Change in appetite - - - 1  Feeling bad or failure about yourself  - - - 0  Trouble  concentrating - - - 0  Moving slowly or fidgety/restless - - - 0  Suicidal thoughts - - - 0  PHQ-9 Score - - - 2     Review of Systems  Neurological: Positive for tremors, weakness and numbness.       Tingling  All other systems reviewed and are negative.      Objective:   Physical Exam Vitals and nursing note reviewed.  Constitutional:      Appearance: Normal appearance.  Cardiovascular:     Rate and Rhythm: Normal rate and regular rhythm.     Pulses: Normal pulses.     Heart sounds: Normal heart sounds.  Pulmonary:     Effort: Pulmonary effort is normal.     Breath sounds: Normal breath sounds.  Musculoskeletal:     Cervical back: Normal range of motion and neck supple.     Comments: Normal Muscle Bulk and Muscle Testing Reveals:  Upper Extremities: Right: Full ROM and Muscle Strength 5/5 Left: Decreased ROM 90 Degrees and Muscle Strength 4/5 Lower Extremities: Full ROM and Muscle Strength 5/5 Arrived in wheelchair   Skin:    General: Skin is warm and dry.  Neurological:     Mental Status: He is alert and oriented to person, place, and time.  Psychiatric:        Mood and Affect: Mood normal.        Behavior: Behavior normal.           Assessment & Plan:  1. C6 Spinal Cord Injury:/ . Spastic Tetraplegia: Continue HEP as Tolerated and Continue to monitor.10/05/2019 2. Neurogenic Bladder: Continue with I/O Caths Patient reports he performs 3 in and out caths per day ranging200-500 cc.10/05/2019. 3. Neurogenic Bowel: Continue Bowel Program:10/05/2019. 4. Chronic Pain Syndrome: Continue Gabapentin, Pamelor, Refilled: Butrans37mcg/hr Patch weekly and Tramadol.10/05/2019 We will continue the opioid monitoring program, this consists of regular clinic visits, examinations, urine drug screen, pill counts as well as use of 10/07/2019 Controlled Substance Reporting system.  [place] of face to face patient care time was spent during this visit. All  questions were encouraged and answered.  F/U in70months

## 2019-10-06 ENCOUNTER — Encounter: Payer: Self-pay | Admitting: Registered Nurse

## 2019-10-22 ENCOUNTER — Other Ambulatory Visit: Payer: Self-pay | Admitting: Physical Medicine & Rehabilitation

## 2019-10-22 DIAGNOSIS — G894 Chronic pain syndrome: Secondary | ICD-10-CM

## 2019-10-22 DIAGNOSIS — G8250 Quadriplegia, unspecified: Secondary | ICD-10-CM

## 2019-10-22 DIAGNOSIS — S14106S Unspecified injury at C6 level of cervical spinal cord, sequela: Secondary | ICD-10-CM

## 2019-10-22 DIAGNOSIS — N319 Neuromuscular dysfunction of bladder, unspecified: Secondary | ICD-10-CM

## 2019-10-22 DIAGNOSIS — K592 Neurogenic bowel, not elsewhere classified: Secondary | ICD-10-CM

## 2019-12-04 ENCOUNTER — Other Ambulatory Visit: Payer: Self-pay | Admitting: Physical Medicine & Rehabilitation

## 2019-12-04 ENCOUNTER — Other Ambulatory Visit: Payer: Self-pay | Admitting: Registered Nurse

## 2019-12-04 DIAGNOSIS — K592 Neurogenic bowel, not elsewhere classified: Secondary | ICD-10-CM

## 2019-12-04 DIAGNOSIS — G8250 Quadriplegia, unspecified: Secondary | ICD-10-CM

## 2019-12-04 DIAGNOSIS — G894 Chronic pain syndrome: Secondary | ICD-10-CM

## 2019-12-04 DIAGNOSIS — S14106S Unspecified injury at C6 level of cervical spinal cord, sequela: Secondary | ICD-10-CM

## 2019-12-04 DIAGNOSIS — N319 Neuromuscular dysfunction of bladder, unspecified: Secondary | ICD-10-CM

## 2020-01-05 ENCOUNTER — Encounter: Payer: Self-pay | Admitting: Registered Nurse

## 2020-01-05 ENCOUNTER — Other Ambulatory Visit: Payer: Self-pay

## 2020-01-05 ENCOUNTER — Encounter: Payer: Medicare Other | Attending: Registered Nurse | Admitting: Registered Nurse

## 2020-01-05 VITALS — BP 93/61 | HR 105 | Temp 98.1°F

## 2020-01-05 DIAGNOSIS — K592 Neurogenic bowel, not elsewhere classified: Secondary | ICD-10-CM | POA: Insufficient documentation

## 2020-01-05 DIAGNOSIS — G894 Chronic pain syndrome: Secondary | ICD-10-CM | POA: Diagnosis present

## 2020-01-05 DIAGNOSIS — S14106S Unspecified injury at C6 level of cervical spinal cord, sequela: Secondary | ICD-10-CM

## 2020-01-05 DIAGNOSIS — M792 Neuralgia and neuritis, unspecified: Secondary | ICD-10-CM | POA: Insufficient documentation

## 2020-01-05 DIAGNOSIS — G8250 Quadriplegia, unspecified: Secondary | ICD-10-CM

## 2020-01-05 DIAGNOSIS — N319 Neuromuscular dysfunction of bladder, unspecified: Secondary | ICD-10-CM | POA: Diagnosis present

## 2020-01-05 DIAGNOSIS — Z5181 Encounter for therapeutic drug level monitoring: Secondary | ICD-10-CM | POA: Diagnosis present

## 2020-01-05 DIAGNOSIS — Z79891 Long term (current) use of opiate analgesic: Secondary | ICD-10-CM | POA: Insufficient documentation

## 2020-01-05 MED ORDER — TRAMADOL HCL 50 MG PO TABS: ORAL_TABLET | ORAL | 2 refills | Status: AC

## 2020-01-05 MED ORDER — TRAMADOL HCL 50 MG PO TABS: ORAL_TABLET | ORAL | 2 refills | Status: DC

## 2020-01-05 MED ORDER — BUPRENORPHINE 15 MCG/HR TD PTWK: MEDICATED_PATCH | TRANSDERMAL | 2 refills | Status: AC

## 2020-01-05 NOTE — Progress Notes (Signed)
Subjective:    Patient ID: Carl [name], male    DOB: 1950-09-07, 69 y.o.   MRN: [id number]  HPI: Carl [name] is a 69 y.o. male who returns for follow up appointment for chronic pain and medication refill. He states his pain is located in his mid- lower back pain. He  rates his pain 4. His. current exercise regime is walking in his home with walker.  Carl [name] Morphine equivalent is 20.00  MME.  Last Oral Swab was Performed on 07/06/2019, it was consistent for Tramadol.    Pain Inventory Average Pain 4 Pain Right Now 4 My pain is sharp, stabbing and tingling  In the last 24 hours, has pain interfered with the following? General activity 8 Relation with others 3 Enjoyment of life 9 What TIME of day is your pain at its worst? morning  and evening Sleep (in general) Fair  Pain is worse with: walking, bending, standing and some activites Pain improves with: rest and medication Relief from Meds: 3  Family History  Problem Relation Age of Onset  . Heart attack Father   . Alzheimer's disease Mother   . Aneurysm Brother        brain   Social History   Socioeconomic History  . Marital status: Married    Spouse name: Not on file  . Number of children: Not on file  . Years of education: Not on file  . Highest education level: Not on file  Occupational History  . Occupation: Pastor  Tobacco Use  . Smoking status: Former Games developer  . Smokeless tobacco: Never Used  Substance and Sexual Activity  . Alcohol use: Not on file  . Drug use: Not on file  . Sexual activity: Not on file  Other Topics Concern  . Not on file  Social History Narrative  . Not on file   Social Determinants of Health   Financial Resource Strain:   . Difficulty of Paying Living Expenses: Not on file  Food Insecurity:   . Worried About Programme researcher, broadcasting/film/video in the Last Year: Not on file  . Ran Out of Food in the Last Year: Not on file  Transportation Needs:   . Lack of Transportation (Medical): Not on  file  . Lack of Transportation (Non-Medical): Not on file  Physical Activity:   . Days of Exercise per Week: Not on file  . Minutes of Exercise per Session: Not on file  Stress:   . Feeling of Stress : Not on file  Social Connections:   . Frequency of Communication with Friends and Family: Not on file  . Frequency of Social Gatherings with Friends and Family: Not on file  . Attends Religious Services: Not on file  . Active Member of Clubs or Organizations: Not on file  . Attends Banker Meetings: Not on file  . Marital Status: Not on file   Past Surgical History:  Procedure Laterality Date  . ANTERIOR CERVICAL DECOMP/DISCECTOMY FUSION N/A 11/14/2014   Procedure: Cervical Five-Cervical Six  Anterior Cervical Decompression/Diskectomy/Fusion;  Surgeon: Maeola Harman, MD;  Location: [facility];  Service: Neurosurgery;  Laterality: N/A;   Past Surgical History:  Procedure Laterality Date  . ANTERIOR CERVICAL DECOMP/DISCECTOMY FUSION N/A 11/14/2014   Procedure: Cervical Five-Cervical Six  Anterior Cervical Decompression/Diskectomy/Fusion;  Surgeon: Maeola Harman, MD;  Location: [facility];  Service: Neurosurgery;  Laterality: N/A;   Past Medical History:  Diagnosis Date  . Bradycardia   . Chronic abdominal pain   .  Hesitancy of micturition    BP 93/61   Pulse (!) 105   Temp 98.1 F (36.7 C)   SpO2 91%   Opioid Risk Score:   Fall Risk Score:  `1  Depression screen PHQ 2/9  Depression screen [facility] 2/9 08/11/2018 07/20/2017 12/24/2015 01/09/2015  Decreased Interest 0 0 0 0  Down, Depressed, Hopeless 0 0 0 0  PHQ - 2 Score 0 0 0 0  Altered sleeping - - - 0  Tired, decreased energy - - - 1  Change in appetite - - - 1  Feeling bad or failure about yourself  - - - 0  Trouble concentrating - - - 0  Moving slowly or fidgety/restless - - - 0  Suicidal thoughts - - - 0  PHQ-9 Score - - - 2   Review of Systems  Musculoskeletal: Positive for gait problem.  Neurological:  Positive for weakness.  All other systems reviewed and are negative.      Objective:   Physical Exam Vitals and nursing note reviewed.  Constitutional:      Appearance: Normal appearance.  Cardiovascular:     Rate and Rhythm: Normal rate and regular rhythm.     Pulses: Normal pulses.     Heart sounds: Normal heart sounds.  Pulmonary:     Effort: Pulmonary effort is normal.     Breath sounds: Normal breath sounds.  Musculoskeletal:     Cervical back: Normal range of motion and neck supple.     Comments: Normal Muscle Bulk and Muscle Testing Reveals:  Upper Extremities: Full ROM and Muscle Strength 4/5 Thoracic Paraspinal Tenderness: T-7-T-9 Lumbar Paraspinal Tenderness: L-3-L-5 Lower Extremities: Full ROM and Muscle Strength 5/5 Arrived in wheelchair   Skin:    General: Skin is warm and dry.  Neurological:     Mental Status: He is alert and oriented to person, place, and time.  Psychiatric:        Mood and Affect: Mood normal.        Behavior: Behavior normal.           Assessment & Plan:  1. C6 Spinal Cord Injury:/ . Spastic Tetraplegia: Continue HEP as Tolerated and Continue to monitor.01/05/2020 2. Neurogenic Bladder: Continue with I/O Caths Patient reports he performs 3 in and out caths per day ranging200-500 cc.01/05/2020. 3. Neurogenic Bowel: Continue Bowel Program:01/05/2020. 4. Chronic Pain Syndrome: Continue Gabapentin, Pamelor, Refilled: Butrans71mcg/hr Patch weekly and Tramadol 50 mg one tablet every 6 hours as needed #120. Marland Kitchen09/30/2021 We will continue the opioid monitoring program, this consists of regular clinic visits, examinations, urine drug screen, pill counts as well as use of [place] Controlled Substance Reporting system. A 12 month History has been reviewed on the [place] Controlled Substance Reporting System on 01/05/2020.    of face to face patient care time was spent during this visit. All questions were  encouraged and answered.  F/U in35months

## 2020-03-27 ENCOUNTER — Ambulatory Visit: Payer: Medicare Other | Admitting: Registered Nurse

## 2020-04-07 DEATH — deceased
